# Patient Record
Sex: Male | Born: 1945 | Race: Black or African American | Hispanic: No | Marital: Married | Smoking: Former smoker
Health system: Southern US, Community
[De-identification: ages and names within clinical notes are randomized; demographics above are authoritative.]

## PROBLEM LIST (undated history)

## (undated) DIAGNOSIS — I214 Non-ST elevation (NSTEMI) myocardial infarction: Secondary | ICD-10-CM

## (undated) DIAGNOSIS — E119 Type 2 diabetes mellitus without complications: Secondary | ICD-10-CM

## (undated) DIAGNOSIS — E785 Hyperlipidemia, unspecified: Secondary | ICD-10-CM

## (undated) DIAGNOSIS — N183 Chronic kidney disease, stage 3 unspecified: Secondary | ICD-10-CM

## (undated) DIAGNOSIS — R112 Nausea with vomiting, unspecified: Secondary | ICD-10-CM

## (undated) DIAGNOSIS — E1122 Type 2 diabetes mellitus with diabetic chronic kidney disease: Secondary | ICD-10-CM

## (undated) DIAGNOSIS — I219 Acute myocardial infarction, unspecified: Secondary | ICD-10-CM

## (undated) DIAGNOSIS — D72829 Elevated white blood cell count, unspecified: Secondary | ICD-10-CM

## (undated) DIAGNOSIS — Z9111 Patient's noncompliance with dietary regimen: Secondary | ICD-10-CM

## (undated) DIAGNOSIS — I5022 Chronic systolic (congestive) heart failure: Secondary | ICD-10-CM

## (undated) DIAGNOSIS — I480 Paroxysmal atrial fibrillation: Secondary | ICD-10-CM

## (undated) DIAGNOSIS — I2510 Atherosclerotic heart disease of native coronary artery without angina pectoris: Secondary | ICD-10-CM

## (undated) DIAGNOSIS — I255 Ischemic cardiomyopathy: Secondary | ICD-10-CM

## (undated) DIAGNOSIS — Z91119 Patient's noncompliance with dietary regimen due to unspecified reason: Secondary | ICD-10-CM

## (undated) DIAGNOSIS — I10 Essential (primary) hypertension: Secondary | ICD-10-CM

## (undated) DIAGNOSIS — M109 Gout, unspecified: Secondary | ICD-10-CM

## (undated) DIAGNOSIS — Z9889 Other specified postprocedural states: Secondary | ICD-10-CM

## (undated) DIAGNOSIS — I200 Unstable angina: Secondary | ICD-10-CM

## (undated) HISTORY — DX: Hyperlipidemia, unspecified: E78.5

## (undated) HISTORY — DX: Atherosclerotic heart disease of native coronary artery without angina pectoris: I25.10

## (undated) HISTORY — DX: Type 2 diabetes mellitus with diabetic chronic kidney disease: E11.22

## (undated) HISTORY — PX: TONSILLECTOMY: SUR1361

## (undated) HISTORY — DX: Chronic kidney disease, stage 3 unspecified: N18.30

## (undated) HISTORY — DX: Essential (primary) hypertension: I10

## (undated) HISTORY — DX: Chronic kidney disease, stage 3 (moderate): N18.3

## (undated) HISTORY — DX: Morbid (severe) obesity due to excess calories: E66.01

## (undated) HISTORY — DX: Gout, unspecified: M10.9

## (undated) HISTORY — DX: Type 2 diabetes mellitus without complications: E11.9

## (undated) HISTORY — DX: Ischemic cardiomyopathy: I25.5

---

## 1993-12-20 HISTORY — PX: CORONARY ARTERY BYPASS GRAFT: SHX141

## 2008-01-12 ENCOUNTER — Inpatient Hospital Stay (HOSPITAL_COMMUNITY): Admission: EM | Admit: 2008-01-12 | Discharge: 2008-01-19 | Payer: Self-pay | Admitting: Emergency Medicine

## 2008-01-12 ENCOUNTER — Ambulatory Visit: Payer: Self-pay | Admitting: Internal Medicine

## 2008-01-12 HISTORY — PX: CORONARY ANGIOPLASTY WITH STENT PLACEMENT: SHX49

## 2008-01-17 ENCOUNTER — Encounter (INDEPENDENT_AMBULATORY_CARE_PROVIDER_SITE_OTHER): Payer: Self-pay | Admitting: Cardiology

## 2008-02-12 ENCOUNTER — Encounter (HOSPITAL_COMMUNITY): Admission: RE | Admit: 2008-02-12 | Discharge: 2008-05-12 | Payer: Self-pay | Admitting: Cardiology

## 2009-10-30 ENCOUNTER — Encounter: Admission: RE | Admit: 2009-10-30 | Discharge: 2009-10-30 | Payer: Self-pay | Admitting: Nephrology

## 2011-01-10 ENCOUNTER — Encounter: Payer: Self-pay | Admitting: Surgery

## 2011-05-04 NOTE — Discharge Summary (Signed)
NAMECYPHER, [name] NO.:  [id number]   MEDICAL RECORD NO.:  [id number]          PATIENT TYPE:  INP   LOCATION:  2039                         FACILITY:  [facility]   PHYSICIAN:  Madaline Savage, M.D.DATE OF BIRTH:  04/10/1946   DATE OF ADMISSION:  01/11/2008  DATE OF DISCHARGE:  01/19/2008                               DISCHARGE SUMMARY   DISCHARGE DIAGNOSES:  1. Status post acute non ST elevation myocardial infarction during      this admission treated with PTCA and stenting __________ SVG to a      diagonal branch.  2. Known history of coronary artery disease with diffuse coronary      artery bypass grafting surgery remotely in 1995.  3. Non-ischemic cardiomyopathy with ejection fraction of 20%.  4. Non-sustained ventricular tachycardia -20-beat run status post      electrophysiology consult during this admission, recommendations      are consult during this admission by Dr. Ladona Ridgel.  Recommendations      are to continue medical care and repeat echocardiogram in three      months.  5. Hypertension.  6. Diabetes mellitus type 2.  7. Morbid obesity.  8. Dyslipidemia.  9. Tobacco use.  10.Obstructive sleep apnea -  The patient needs sleep studies as soon as possible.   HISTORY OF PRESENT ILLNESS:  The patient is a 66 year old gentleman  patient of Dr. Elsie Lincoln, who had been having shortness of breath and chest  discomfort as well as congestion in the chest for probably a week or so.  On the day of his presentation to the emergency room, he visited his  primary care physician and received a steroid injection, but still his  condition did not improve significantly, and when he came back home  after visiting his primary care physician, he developed more episodes of  substernal chest pain and went to urgent care from where he was sent to  [facility] for evaluation.  His EKG on admission shows ischemic  changes in the inferior lateral leads.  __________  enzymes and they  revealed positive results.  We started him on IV nitroglycerin and  heparin, and the next morning, he underwent coronary angiography.  Catheter was performed by Dr. Elsie Lincoln, and it showed 100% stenosis of the  proximal circumflex, proximal LAD and proximal RCA, and EF 20%.  He also  had 100% stenosis of the occluded SVG, osteal SVG to the RCA, and osteal  SVG to the circumflex.  Dr. Elsie Lincoln performed  angioplasty and stenting  of the SVG to the diagonal branch.  The patient tolerated the procedure  and had no significant complications.   Because of history the patient with nonsustained VT, we ordered  electrophysiology consult, and Dr. Ladona Ridgel saw the patient.  The patient  underwent EP studies to see if it would be possible to induce  arrhythmia, but study was negative, and so with the negative EP study,  it was felt like patient would benefit from aggressive medical therapy  with ischemic cardiomyopathy, and then have a repeat echo in three  months.  If  his EF remains less or equal to 35%, then he might be a  candidate for implantation of prophylactic ICD.   The patient's blood pressure remained under control, and he did not have  any recurrence of NSVT.   Given his invasive pressure he also has problems with maintaining the  right sleep at night that may suggest obstructive sleep apnea, and he  will need to undergo sleep study, and if it is positive to be placed on  CPAP or BiPAP.   DISCHARGE LABORATORIES:  Hemoglobin 12.1, hematocrit 36.1, white blood  cell count 9.6, platelets 174.  Sodium 141, potassium 4.2, chloride 107,  CO2 of 30, BUN 18, creatinine 1.22, glucose 140.   Enzymes showed a CK of 3,590, CK-MB 181.9, and troponin 86.21 with a  relative index of 51.   Second set showed CK 2,411, CK-MB 52.1, and troponin 52.3.  TSH was  1.47, hemoglobin A1c 6.6.   DISCHARGE MEDICATIONS:  1. Actos 15 mg q d.  2. Aspirin 81 mg q d.  3. Lipitor 80 mg q h.s.  4.  Lisinopril 10 mg q d.  5. Protonix 40 mg q d.  6. Aldactone 25 mg q d.  7. Plavix 75 mg q d.  8. Coreg 12.5 mg b.i.d.  9. Lasix 40 mg q d.  10.Potassium chloride 20 mEq q d.  11.Aspirin 81 mg q d.   PLAN:  Dr. Elsie Lincoln will see the patient in the clinic on the 13th of  February at 9:30 a.m.  Dr. Olga Millers office will contact the  patient to schedule an appointment in three months.      Raymon Mutton, P.A.    ______________________________  Madaline Savage, M.D.    MK/MEDQ  D:  01/19/2008  T:  01/19/2008  Job:  [id number]   cc:   Doylene Canning. Ladona Ridgel, MD

## 2011-05-04 NOTE — Consult Note (Signed)
[name], [name] NO.:  [id number]   MEDICAL RECORD NO.:  [id number]          PATIENT TYPE:  INP   LOCATION:  2921                         FACILITY:  [facility]   PHYSICIAN:  Doylene Canning. Ladona Ridgel, MD    DATE OF BIRTH:  31-Dec-1945   DATE OF CONSULTATION:  01/16/2008  DATE OF DISCHARGE:                                 CONSULTATION   REASON FOR CONSULTATION:  Evaluation of nonsustained VT in a patient  with recent non-Q-wave myocardial infarction with LV dysfunction.   HISTORY OF PRESENT ILLNESS:  The patient is a 65 year old man who has a  long history of noncompliance.  He underwent initial bypass surgery in  1990 and was lost to follow-up for nearly 15 years.  He presented to the  hospital on the present occasion with substernal chest pain for which he  waited late in his clinical course to seek medical attention, and was  subsequently found to have initial troponin of 80 with catheterization  demonstrating an occluded vein graft to a diagonal branch for which he  underwent percutaneous stenting with a drug-eluting stent.  The  patient's post procedure course has been fairly unremarkable except for  runs of nonsustained ventricular tachycardia which have been  asymptomatic.  He denies any frank history of syncope and has otherwise  no complaints.  His heart failure is between class II and III.  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   ADDITIONAL PAST MEDICAL HISTORY:  1. sleep apnea for which he does not wear CPAP mask.  2. Hypertension which he has not treated with medications.  3. Diabetes which has been moderately controlled.  4. Morbid obesity for which the patient has gained over 100 pounds in      the last 10 years.  5. Hypertension.   FAMILY HISTORY:  Not notable for premature coronary disease.   SOCIAL HISTORY:  The patient is married.  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  He has a history tobacco  use and  continues to smoke cigarettes, though now less than half pack per day.  He has no alcohol use.   REVIEW OF SYSTEMS:  As noted in the HPI, otherwise, all systems negative  except as noted above.   PHYSICAL EXAMINATION:  General:  He is a pleasant, obese, middle-aged  man in no distress.  VITAL SIGNS:  Blood pressure 136/91, pulse 67 regular, respirations were  18, temperature is 98.1.  HEENT:  Normocephalic and atraumatic.  Pupils equal and round.  Oropharynx moist.  Sclerae anicteric.  NECK:  Revealed no obvious jugular distention.  No ability to evaluate  this as it is limited by his large neck.  LUNGS:  Revealed rales in the bases bilaterally, but no wheezes or  rhonchi.  Lung sounds were distant.  CARDIAC:  Exam was distant as well.  Regular rate and rhythm with normal  S1-S2.  The PMI was enlarged and laterally displaced.  The heart sounds  were muffled and distant.  ABDOMINAL:  Morbidly obese, nontender, nondistended.  No organomegaly.  Bowel sounds are present.  No rebound or guarding.  EXTREMITIES:  Demonstrate no cyanosis or clubbing.  There was 1+ peripheral edema.  NEUROLOGIC:  Alert and oriented x3.  Cranial nerves intact.  Strength  5/5 and symmetric.  SKIN:  Exam was notable for areas of very thickened skin consistent with  his obesity.   IMPRESSION:  1. Ischemic cardiomyopathy.  2. Non-Q-wave myocardial infarction status post stenting.  3. Longstanding obesity.  4. 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  5. Sleep apnea poorly controlled.   DISCUSSION:  I discussed treatment options with the patient, his wife  and Dr. Elsie Lincoln.  With his recent myocardial infarction and percutaneous  stenting, he is not a candidate for prophylactic ICD implantation.  However, with his nonsustained VT and LV dysfunction, he is a candidate  for invasive EP study, i.e. the must data.  If he has inducible VT then  ICD implantation will be forthcoming.   Otherwise, a repeat of his echo  in 3 months would be warranted to see what his LV function does with  prophylactic defibrillator placed at that time.      Doylene Canning. Ladona Ridgel, MD  Electronically Signed     GWT/MEDQ  D:  01/16/2008  T:  01/16/2008  Job:  [id number]

## 2011-05-04 NOTE — Cardiovascular Report (Signed)
NAMEJALAL, RAUCH NO.:  [id number]   MEDICAL RECORD NO.:  [id number]          PATIENT TYPE:  INP   LOCATION:  2807                         FACILITY:  [facility]   PHYSICIAN:  Madaline Savage, M.D.DATE OF BIRTH:  10/23/1946   DATE OF PROCEDURE:  01/12/2008  DATE OF DISCHARGE:                            CARDIAC CATHETERIZATION   PROCEDURES PERFORMED:  1. Selective coronary angiography by Judkins technique.  2. Retrograde left heart catheterization.  3. Left ventricular angiography.  4. Selective visualization of a saphenous vein graft to a diagonal      branch of the LAD.  5. Selective visualization of two others saphenous vein grafts which      ended up being occluded, one to circumflex, one to right coronary      artery.  6. Selective visualization of the left internal mammary artery graft      to the distal LAD.  7. Percutaneous balloon angioplasty of the distal end of a saphenous      vein graft to diagonal which was 99% stenosed at the junction      between the saphenous vein graft and the diagonal itself.  8. Intracoronary stenting of the balloon angioplasty site which was      very tortuous.   COMPLICATIONS:  None.   ENTRY SITE:  Right femoral artery utilizing a long needle and a Doppler  device because of morbid obesity.   PATIENT PROFILE:  Mr. Sliney is a 65 year old African-American  gentleman who has known coronary disease and underwent coronary bypass  grafting in the early 1990s. None of his medical records were available  at the time of his hospital entry or during the cardiac catheterization.  He presented to the emergency room last evening with acute coronary  syndrome and the first troponin was said to be 4.35 at a time when the  patient was fairly stable so he was electively brought to the cath lab  this morning. Review of his old cath lab folder which was just found a  few moments ago shows that Dr. Delfin Edis treated him for  unstable  angina back in April 1990. At that time he was a chronic smoker and Dr.  Particia Lather performed bypass April 13, 1989. The patient received  left internal mammary artery grafting to the LAD, a saphenous vein graft  from the aorta to the RCA, saphenous vein graft from the aorta to an  obtuse marginal branch of the circumflex and a saphenous vein graft from  the aorta to a diagonal branch.   ANGIOGRAPHIC RESULTS:  The left main coronary artery was patent.  The  LAD was 100% occluded almost at the left main LAD junction.  The  circumflex was also occluded very proximally to the left main.  The  right coronary artery was 100% occluded very proximally with a number of  very chronic collateralizing branches that supply the distal RCA and  failed what looked like a posterior descending branch and a distal RCA  as well.   The saphenous vein graft to the RCA was occluded.   Saphenous vein graft to  the circumflex was occluded.   Saphenous vein graft to the aorta obtuse marginal branch was occluded.   Internal mammary artery graft to the LAD was widely patent throughout  and was large in circumference. There was distal LAD disease that was  mild and fairly diffuse and not really amenable to percutaneous  intervention. As previously stated the saphenous vein graft to  circumflex was stenosed 95-99% with what turned out to be very fibrous  stenotic plaque.   Left ventricular angiogram showed a dilated left ventricle with  diffusely hypokinetic LV.   Percutaneous intervention to the saphenous vein graft to the diagonal  was performed first using balloon dilatation and then using  intracoronary artery stenting.  It was technically difficult because of  the guide catheter backup, lack of support. The vessel was predilated  with balloon angioplasty and then stented with a Taxus Adam stent and  postdilated up to 3 mm diameter.  There was improvement of blood flow in  the distal  diagonal which was a very large vessel and the patient  tolerated the procedure poorly because of a lot of breathing issues,  falling asleep and the patient would appear to have obstructive sleep  apnea.   FINAL IMPRESSION:  1. Acute coronary syndrome.  2. Non-ST segment elevation myocardial infarction based on troponin      values.  3. Multivessel coronary disease with occluded vein grafts to multiple      sites including RCA, obtuse marginal branch and circumflex.  4. Successful balloon angioplasty with percutaneous stenting of the      distal graft supplying this large diagonal branch.   PLAN:  The patient will be recovered in the holding area and transferred  to unit 6500.           ______________________________  Madaline Savage, M.D.     WHG/MEDQ  D:  01/12/2008  T:  01/12/2008  Job:  [id number]

## 2011-05-04 NOTE — Op Note (Signed)
NAMEDEVARIS, QUIRK NO.:  [id number]   MEDICAL RECORD NO.:  [id number]          PATIENT TYPE:  INP   LOCATION:  2921                         FACILITY:  [facility]   PHYSICIAN:  Duke Salvia, MD, FACCDATE OF BIRTH:  [birth year]   DATE OF PROCEDURE:  01/16/2008  DATE OF DISCHARGE:                               OPERATIVE REPORT   PREOPERATIVE DIAGNOSIS:  Nonsustained ventricular tachycardia and  ischemic cardiomyopathy.   POSTOPERATIVE DIAGNOSIS:  Nonsustained ventricular tachycardia and  ischemic cardiomyopathy.   PROCEDURE:  Invasive electrophysiological study.   Following obtaining informed consent, the patient was brought to the  electrophysiology laboratory and placed on the fluoroscopic table in  supine position.  After routine prep and drape, cardiac catheterization  was performed moderate difficulty because of the patient's large size.  I actually stuck the artery on one occasion, stuck the vein on one  occasion, were unable to  pass wires and were able to get to separate  venipunctures.  6-French sheaths were placed and at the end of procedure  the catheters removed.  Hemostasis was obtained the patient was  transferred holding area in stable condition.   CATHETERS:  A 5-French quadripolar catheter was inserted via left  femoral vein to the AV junction measure His electrogram.  A 5-French quadripolar catheter was inserted via left femoral vein to  the right ventricular apex subsequently the right ventricular outflow  tract and then to the right atrium.   Surface leads 1, aVF and V1 were monitored continuously throughout the  procedure.  Following insertion of the catheters stimulation protocol  included incremental atrial pacing.  Incremental ventricular pacing.   Single and double ventricular extra stimuli from the right ventricular  apex at paced cycle length of 400:600 milliseconds.  Double and triple ventricular extra stimuli from right  ventricular apex.  The right ventricular outflow tract at paced cycle length of 600 and 400  milliseconds.   RESULTS:  Surface electrocardiogram and basic intervals.  Rhythm sinus; RR interval:  859 milliseconds; R interval 1003  milliseconds; PR interval 159 milliseconds; QRS duration 108  milliseconds; QT interval 396 milliseconds; P-wave duration was 98  milliseconds; AH interval 79 milliseconds; HV interval 49 milliseconds.  AV nodal function.  AV Wenckebach cycle length was 300 milliseconds.  VA conduction was 1 to 1 at 400 milliseconds.  AV nodal effective refractory period was less than 320 milliseconds  which was a functional refractory period of the atrium.   ACCESSORY PATHWAY FUNCTION:  No evidence of accessory pathway was  identified.   Ventricular response to programmed stimulation.  Effective refractory  period effective refractory ventricular apex at a pace cycle length of  600 milliseconds was 270 milliseconds and at 400 milliseconds was 260  milliseconds.  Effective refractory for the right ventricular outflow tract at paced  cycle length of 600 milliseconds was 270 milliseconds and 400  milliseconds was 240 milliseconds.  The closest coupling interval at  right ventricular apex at 600 milliseconds 290:210:210 and at 400  milliseconds was 260:200:200.  From right ventricular outflow tract at  600 was 270:210 and at  400 milliseconds was 240:200:200.   ARRHYTHMIAS INDUCED:  No sustained ventricular tachycardia was induced.  One burst of three beats of nonsustained VT was inducible.   IMPRESSION:  1. Normal sinus function apart from modest bradycardia.  2. Normal atrial function.  3. Dual AV nodal physiology with isolated single echo beats.  4. Normal His-Purkinje system function.  5. No accessory pathway.  6. No inducible sustained ventricular arrhythmias.   In summary, the results electrophysiological testing failed to identify  substrate for malignant  ventricular arrhythmias, notwithstanding his  nonsustained ventricular tachycardia.   Based on the Dynamite trial observation for 40 days is recommended with  reassessment of left ventricular function at which time primary  prevention ICD implantation could be pursued if his ejection fraction  remained depressed.      Duke Salvia, MD, [facility]  Electronically Signed     SCK/MEDQ  D:  01/16/2008  T:  01/17/2008  Job:  [id number]   cc:   Madaline Savage, M.D.

## 2011-09-10 LAB — TSH: TSH: 1.472

## 2011-09-10 LAB — HEMOGLOBIN A1C: Mean Plasma Glucose: 158

## 2011-09-10 LAB — BASIC METABOLIC PANEL
BUN: 17
BUN: 17
BUN: 18
BUN: 18
BUN: 18
BUN: 19
CO2: 21
CO2: 30
CO2: 31
CO2: 32
Calcium: 8.4
Calcium: 8.5
Calcium: 8.6
Calcium: 8.7
Calcium: 8.7
Calcium: 8.8
Calcium: 9
Chloride: 102
Chloride: 104
Chloride: 105
Chloride: 107
Chloride: 99
Creatinine, Ser: 1.16
Creatinine, Ser: 1.32
Creatinine, Ser: 1.38
Creatinine, Ser: 1.39
GFR calc Af Amer: >60
GFR calc Af Amer: >60
GFR calc Af Amer: >60
GFR calc Af Amer: >60
GFR calc non Af Amer: 55 — ABNORMAL LOW
GFR calc non Af Amer: >60
GFR calc non Af Amer: >60
GFR calc non Af Amer: >60
Glucose, Bld: 130 — ABNORMAL HIGH
Glucose, Bld: 131 — ABNORMAL HIGH
Glucose, Bld: 140 — ABNORMAL HIGH
Glucose, Bld: 162 — ABNORMAL HIGH
Glucose, Bld: 384 — ABNORMAL HIGH
Potassium: 4.1
Potassium: 4.3
Sodium: 137
Sodium: 139
Sodium: 140
Sodium: 140
Sodium: 141

## 2011-09-10 LAB — CBC
HCT: 37.8 — ABNORMAL LOW
HCT: 40
HCT: 45.8
Hemoglobin: 12 — ABNORMAL LOW
Hemoglobin: 12.1 — ABNORMAL LOW
Hemoglobin: 12.4 — ABNORMAL LOW
Hemoglobin: 13
Hemoglobin: 14.8
MCHC: 32.8
MCHC: 33.3
MCHC: 33.6
MCV: 89.6
MCV: 89.6
MCV: 89.9
MCV: 89.9
Platelets: 175
Platelets: 176
Platelets: 179
Platelets: 186
Platelets: 193
Platelets: 213
Platelets: 237
RBC: 4.03 — ABNORMAL LOW
RBC: 4.07 — ABNORMAL LOW
RBC: 4.07 — ABNORMAL LOW
RBC: 4.7
RDW: 15.1
RDW: 15.1
RDW: 15.3
RDW: 15.4
RDW: 15.6 — ABNORMAL HIGH
WBC: 10.1
WBC: 10.3
WBC: 11 — ABNORMAL HIGH
WBC: 13.2 — ABNORMAL HIGH
WBC: 17.3 — ABNORMAL HIGH
WBC: 9.6

## 2011-09-10 LAB — POCT CARDIAC MARKERS
CKMB, poc: 45
Troponin i, poc: 4.88

## 2011-09-10 LAB — I-STAT 8, (EC8 V) (CONVERTED LAB)
Acid-base deficit: 4 — ABNORMAL HIGH
Hemoglobin: 17.3 — ABNORMAL HIGH
Operator id: 196461
Potassium: 4.8
Sodium: 134 — ABNORMAL LOW
TCO2: 20

## 2011-09-10 LAB — DIFFERENTIAL
Eosinophils Relative: 0
Lymphocytes Relative: 5 — ABNORMAL LOW
Lymphs Abs: 0.6 — ABNORMAL LOW
Lymphs Abs: 1.1
Monocytes Absolute: 0.1
Monocytes Relative: 3
Neutro Abs: 15.8 — ABNORMAL HIGH
Neutrophils Relative %: 91 — ABNORMAL HIGH

## 2011-09-10 LAB — POCT I-STAT 3, ART BLOOD GAS (G3+)
Acid-base deficit: 3 — ABNORMAL HIGH
Operator id: 270211
pO2, Arterial: 69 — ABNORMAL LOW

## 2011-09-10 LAB — CK TOTAL AND CKMB (NOT AT ARMC)
CK, MB: 271.2 — ABNORMAL HIGH
CK, MB: 52.1 — ABNORMAL HIGH
Relative Index: 2.2
Relative Index: 7.7 — ABNORMAL HIGH
Relative Index: 8.1 — ABNORMAL HIGH
Total CK: 3360 — ABNORMAL HIGH

## 2011-09-10 LAB — CARDIAC PANEL(CRET KIN+CKTOT+MB+TROPI): Troponin I: 86.21

## 2011-09-10 LAB — TROPONIN I: Troponin I: 72.73

## 2011-09-10 LAB — LIPID PANEL: Triglycerides: 58

## 2011-09-10 LAB — PROTIME-INR
INR: 1
Prothrombin Time: 13.4

## 2011-09-10 LAB — HEPARIN LEVEL (UNFRACTIONATED): Heparin Unfractionated: 0.59

## 2014-01-25 ENCOUNTER — Encounter: Payer: Self-pay | Admitting: *Deleted

## 2014-01-25 ENCOUNTER — Telehealth: Payer: Self-pay | Admitting: *Deleted

## 2014-01-25 ENCOUNTER — Encounter: Payer: Self-pay | Admitting: Cardiovascular Disease

## 2014-01-25 ENCOUNTER — Ambulatory Visit (INDEPENDENT_AMBULATORY_CARE_PROVIDER_SITE_OTHER): Payer: Medicare Other | Admitting: Cardiovascular Disease

## 2014-01-25 VITALS — BP 100/70 | HR 83 | Ht 79.0 in | Wt 384.0 lb

## 2014-01-25 DIAGNOSIS — M109 Gout, unspecified: Secondary | ICD-10-CM

## 2014-01-25 DIAGNOSIS — E1129 Type 2 diabetes mellitus with other diabetic kidney complication: Secondary | ICD-10-CM

## 2014-01-25 DIAGNOSIS — I5042 Chronic combined systolic (congestive) and diastolic (congestive) heart failure: Secondary | ICD-10-CM | POA: Insufficient documentation

## 2014-01-25 DIAGNOSIS — I509 Heart failure, unspecified: Secondary | ICD-10-CM

## 2014-01-25 DIAGNOSIS — N183 Chronic kidney disease, stage 3 unspecified: Secondary | ICD-10-CM

## 2014-01-25 DIAGNOSIS — I5022 Chronic systolic (congestive) heart failure: Secondary | ICD-10-CM

## 2014-01-25 DIAGNOSIS — E1122 Type 2 diabetes mellitus with diabetic chronic kidney disease: Secondary | ICD-10-CM | POA: Insufficient documentation

## 2014-01-25 DIAGNOSIS — I2510 Atherosclerotic heart disease of native coronary artery without angina pectoris: Secondary | ICD-10-CM | POA: Insufficient documentation

## 2014-01-25 DIAGNOSIS — I2589 Other forms of chronic ischemic heart disease: Secondary | ICD-10-CM

## 2014-01-25 DIAGNOSIS — I255 Ischemic cardiomyopathy: Secondary | ICD-10-CM | POA: Insufficient documentation

## 2014-01-25 DIAGNOSIS — E785 Hyperlipidemia, unspecified: Secondary | ICD-10-CM

## 2014-01-25 MED ORDER — CLOPIDOGREL BISULFATE 75 MG PO TABS: 75.0000 mg | ORAL_TABLET | Freq: Every day | ORAL | Status: DC

## 2014-01-25 MED ORDER — LISINOPRIL 10 MG PO TABS: 10.0000 mg | ORAL_TABLET | Freq: Every day | ORAL | Status: DC

## 2014-01-25 MED ORDER — ATORVASTATIN CALCIUM 80 MG PO TABS: 80.0000 mg | ORAL_TABLET | Freq: Every day | ORAL | Status: DC

## 2014-01-25 MED ORDER — SPIRONOLACTONE 25 MG PO TABS: 25.0000 mg | ORAL_TABLET | Freq: Every day | ORAL | Status: DC

## 2014-01-25 MED ORDER — FUROSEMIDE 40 MG PO TABS: 40.0000 mg | ORAL_TABLET | Freq: Every day | ORAL | Status: DC

## 2014-01-25 MED ORDER — NEBIVOLOL HCL 10 MG PO TABS: 10.0000 mg | ORAL_TABLET | Freq: Every day | ORAL | Status: DC

## 2014-01-25 NOTE — Telephone Encounter (Signed)
All cardiac meds refilled #90 w/3 refills.

## 2014-01-25 NOTE — Telephone Encounter (Signed)
Pt stated that he needs his medication filled. He forgot to let Britta MccreedyBarbara know that he needed refills on all his medication.  [facility]

## 2014-01-25 NOTE — Progress Notes (Signed)
Patient ID: [name], male   DOB: Jun 12, 1946, 68 y.o.   MRN: [id number]      Reason for office visit CHF, CAD  This is Mr. Wigen's first visit since February of 2014 and has not had any major health setback since that time. He has advanced coronary heart disease, is graft dependent and has severe ischemic cardiomyopathy. All his native coronaries are occluded as are the previously placed grafts to the oblique marginal and right coronary artery. He has a patent mammary artery bypass to the LAD and received a stent to the saphenous vein graft to the diagonal artery in 2009. His left ventricular ejection fraction is estimated to be around 15%.  Despite this he is quite active and has NYHA functional class II dyspnea. When walking he cannot keep up with his wife but otherwise has few limitations. He denies any angina pectoris, dyspnea at rest, orthopnea, PND or lower extremity edema. He remains morbidly obese with a BMI well in excess of 40. He rarely smokes a cigarette sometimes every 2 weeks or so. His diabetes control is substantially improved and he tells me that his last hemoglobin A1c was around 6%. He states that his lipid profile was also good, but I do not have those results yet. His creatinine clearance is estimated at around 55 mL per minute, not much changed. He has not had recent gout attacks. His systolic blood pressure is usually right around 100 mm Hg.  He has declined primary prevention defibrillator implantation.   Allergies  Allergen Reactions  . Coreg [Carvedilol]     Current Outpatient Prescriptions  Medication Sig Dispense Refill  . allopurinol (ZYLOPRIM) 100 MG tablet Take 100 mg by mouth daily.      Marland Kitchen aspirin 81 MG tablet Take 81 mg by mouth daily.      Marland Kitchen atorvastatin (LIPITOR) 80 MG tablet Take 80 mg by mouth daily.      Marland Kitchen BYSTOLIC 10 MG tablet Take 10 mg by mouth daily.      . cholecalciferol (VITAMIN D) 1000 UNITS tablet Take 1,000 Units by mouth every other  day.      . clopidogrel (PLAVIX) 75 MG tablet Take 75 mg by mouth daily.      . furosemide (LASIX) 40 MG tablet Take 40 mg by mouth daily.      . insulin aspart (NOVOLOG) 100 UNIT/ML injection Inject 32 Units into the skin daily.      . INVOKANA 100 MG TABS Take 100 mg by mouth daily.      Marland Kitchen LANTUS 100 UNIT/ML injection Inject 32 Units into the skin daily.      Marland Kitchen lisinopril (PRINIVIL,ZESTRIL) 10 MG tablet Take 10 mg by mouth daily.      . Omega-3 Fatty Acids (FISH OIL) 1200 MG CAPS Take by mouth daily.      Marland Kitchen spironolactone (ALDACTONE) 25 MG tablet Take 25 mg by mouth daily.      Marland Kitchen VICTOZA 18 MG/3ML SOPN Inject 1.25 mg into the skin daily.       No current facility-administered medications for this visit.    Past Medical History  Diagnosis Date  . Ischemic cardiomyopathy   . CAD (coronary artery disease)   . CHF (congestive heart failure)   . Systemic hypertension   . Chronic kidney disease   . DM (diabetes mellitus)   . Morbid obesity   . CAD s/p CABG 1995, graft dependent (LIMA to LAD, SVG to diagonal stented, other grafts occluded)  01/25/2014    Angiography 2009 with total occlusion of all 3 proximal native coronary arteries, occlusion of the SVG to RCA and SVG to circumflex, patent LIMA to LAD (collaterals to the distal RCA and OM) and stenotic SVG to first diagonal treated with a drug-eluting Taxus atom stent  . Cardiomyopathy, ischemic 01/25/2014    LVEF 15%  . Chronic systolic CHF (congestive heart failure), NYHA class 2 01/25/2014  . DM type 2 causing CKD stage 3 01/25/2014  . Gout 01/25/2014  . Dyslipidemia - low HDL and high triglycerides 01/25/2014    Past Surgical History  Procedure Laterality Date  . Coronary artery bypass graft  1995  . Coronary angioplasty with stent placement  01/12/2008    multivessel CAD occluded vein grafts to multiple sites including RCA,obtuse marginal branch & CX.  Successful PTCA and stenting distal graft.    Family History  Problem Relation Age of  Onset  . Heart failure Mother     History   Social History  . Marital Status: Married    Spouse Name: N/A    Number of Children: N/A  . Years of Education: N/A   Occupational History  . Not on file.   Social History Main Topics  . Smoking status: Current Every Day Smoker  . Smokeless tobacco: Not on file  . Alcohol Use: Yes     Comment: occas.  . Drug Use: No  . Sexual Activity: Not on file   Other Topics Concern  . Not on file   Social History Narrative  . No narrative on file    Review of systems: The patient specifically denies any chest pain at rest or with exertion, dyspnea at rest or with usual exertion, orthopnea, paroxysmal nocturnal dyspnea, syncope, palpitations, focal neurological deficits, intermittent claudication, lower extremity edema, unexplained weight gain, cough, hemoptysis or wheezing.  The patient also denies abdominal pain, nausea, vomiting, dysphagia, diarrhea, constipation, polyuria, polydipsia, dysuria, hematuria, frequency, urgency, abnormal bleeding or bruising, fever, chills, unexpected weight changes, mood swings, change in skin or hair texture, change in voice quality, auditory or visual problems, allergic reactions or rashes, new musculoskeletal complaints other than usual "aches and pains".   PHYSICAL EXAM BP 100/70  Pulse 83  Ht '6\' 7"'  (2.007 m)  Wt 174.181 kg (384 lb)  BMI 43.24 kg/m2 Morbid obesity limits his examination  General: Alert, oriented x3, no distress Head: no evidence of trauma, PERRL, EOMI, no exophtalmos or lid lag, no myxedema, no xanthelasma; normal ears, nose and oropharynx Neck: normal jugular venous pulsations and no hepatojugular reflux; brisk carotid pulses without delay and no carotid bruits Chest: clear to auscultation, no signs of consolidation by percussion or palpation, normal fremitus, symmetrical and full respiratory excursions. Large keloid sternotomy scar Cardiovascular: Not locate the apical impulse,  regular rhythm, normal first and second heart sounds, no murmurs, rubs or gallops Abdomen: no tenderness or distention, no masses by palpation, no abnormal pulsatility or arterial bruits, normal bowel sounds, no hepatosplenomegaly Extremities: no clubbing, cyanosis or edema; 2+ radial, ulnar and brachial pulses bilaterally; 2+ right femoral, posterior tibial and dorsalis pedis pulses; 2+ left femoral, posterior tibial and dorsalis pedis pulses; no subclavian or femoral bruits Neurological: grossly nonfocal   EKG: Sinus rhythm with a single PVCs, minor intraventricular conduction delay most closely resembling an incomplete left bundle branch block (QRS 100 ms), diffuse ST segment depression and T-wave inversion most prominent in the lateral leads, unchanged from previous tracings.  Lipid Panel  Component Value Date/Time   CHOL  Value: 143        ATP III CLASSIFICATION:  <200     mg/dL   Desirable  200-239  mg/dL   Borderline High  >=240    mg/dL   High 01/12/2008 0500   TRIG 58 01/12/2008 0500   HDL 40 01/12/2008 0500   CHOLHDL 3.6 01/12/2008 0500   VLDL 12 01/12/2008 0500   LDLCALC  Value: 91        Total Cholesterol/HDL:CHD Risk Coronary Heart Disease Risk Table                     Men   Women  1/2 Average Risk   3.4   3.3 01/12/2008 0500    BMET    Component Value Date/Time   NA 141 01/19/2008 0355   K 4.2 01/19/2008 0355   CL 107 01/19/2008 0355   CO2 30 01/19/2008 0355   GLUCOSE 140* 01/19/2008 0355   BUN 18 01/19/2008 0355   CREATININE 1.22 01/19/2008 0355   CALCIUM 8.7 01/19/2008 0355   GFRNONAA >60 01/19/2008 0355   GFRAA  Value: >60        The eGFR has been calculated using the MDRD equation. This calculation has not been validated in all clinical 01/19/2008 0355     ASSESSMENT AND PLAN  Despite severe coronary disease (native and graft) and severe cardiomyopathy, S2 normal with has good functional status and there appears to be no change in his clinical situation. He does not have  overt hypervolemia and his functional ability has not changed.  He is on maximum tolerated doses of ACE inhibitors and a combined alpha beta blocker. The blood pressure precludes increases in these medicines. He is also receiving an aldosterone antagonist in a relatively low dose of loop diuretic.  He is on a high dose of active statin and takes aspirin and clopidogrel for prevention of future ischemic events. Diabetes control appears to be markedly improved. Have encouraged him to completely quit smoking but she really doesn't consider the amount that he smokes to be important.  I mentioned to defibrillator therapy again today but he is not interested at all. I reminded him about the importance of sodium restriction and strongly encouraged him to record his weight daily so that he may become aware fluid retention before to cause serious symptoms.   Orders Placed This Encounter  Procedures  . EKG 12-Lead   Meds ordered this encounter  Medications  . insulin aspart (NOVOLOG) 100 UNIT/ML injection    Sig: Inject 32 Units into the skin daily.  . cholecalciferol (VITAMIN D) 1000 UNITS tablet    Sig: Take 1,000 Units by mouth every other day.    Holli Humbles, MD, [place] [phone] office [phone] pager

## 2014-01-25 NOTE — Patient Instructions (Signed)
Your physician recommends that you schedule a follow-up appointment in: ONE YEAR 

## 2014-01-25 NOTE — Telephone Encounter (Signed)
Forwarded to Barbara, CMA.  

## 2014-01-25 NOTE — Telephone Encounter (Signed)
All cardiac meds refilled 

## 2015-01-29 ENCOUNTER — Encounter (HOSPITAL_COMMUNITY): Payer: Self-pay | Admitting: Cardiovascular Disease

## 2015-01-29 ENCOUNTER — Encounter (HOSPITAL_COMMUNITY)
Admission: AD | Disposition: A | Discharge status: Home / Self Care | Payer: Self-pay | Source: Ambulatory Visit | Attending: Cardiovascular Disease

## 2015-01-29 ENCOUNTER — Inpatient Hospital Stay (HOSPITAL_COMMUNITY)
Admission: AD | Admit: 2015-01-29 | Discharge: 2015-02-01 | DRG: 246 | Disposition: A | Discharge status: Home / Self Care | Payer: Medicare Other | Source: Ambulatory Visit | Attending: Cardiovascular Disease | Admitting: Cardiovascular Disease

## 2015-01-29 DIAGNOSIS — T82858A Stenosis of vascular prosthetic devices, implants and grafts, initial encounter: Principal | ICD-10-CM | POA: Diagnosis present

## 2015-01-29 DIAGNOSIS — I10 Essential (primary) hypertension: Secondary | ICD-10-CM | POA: Diagnosis present

## 2015-01-29 DIAGNOSIS — I493 Ventricular premature depolarization: Secondary | ICD-10-CM | POA: Diagnosis not present

## 2015-01-29 DIAGNOSIS — M109 Gout, unspecified: Secondary | ICD-10-CM | POA: Diagnosis present

## 2015-01-29 DIAGNOSIS — I2510 Atherosclerotic heart disease of native coronary artery without angina pectoris: Secondary | ICD-10-CM | POA: Diagnosis present

## 2015-01-29 DIAGNOSIS — Z951 Presence of aortocoronary bypass graft: Secondary | ICD-10-CM

## 2015-01-29 DIAGNOSIS — I2589 Other forms of chronic ischemic heart disease: Secondary | ICD-10-CM

## 2015-01-29 DIAGNOSIS — E781 Pure hyperglyceridemia: Secondary | ICD-10-CM | POA: Diagnosis present

## 2015-01-29 DIAGNOSIS — E1122 Type 2 diabetes mellitus with diabetic chronic kidney disease: Secondary | ICD-10-CM | POA: Diagnosis present

## 2015-01-29 DIAGNOSIS — I2582 Chronic total occlusion of coronary artery: Secondary | ICD-10-CM | POA: Diagnosis present

## 2015-01-29 DIAGNOSIS — Z7982 Long term (current) use of aspirin: Secondary | ICD-10-CM

## 2015-01-29 DIAGNOSIS — Z79899 Other long term (current) drug therapy: Secondary | ICD-10-CM

## 2015-01-29 DIAGNOSIS — I2102 ST elevation (STEMI) myocardial infarction involving left anterior descending coronary artery: Secondary | ICD-10-CM

## 2015-01-29 DIAGNOSIS — Z7902 Long term (current) use of antithrombotics/antiplatelets: Secondary | ICD-10-CM

## 2015-01-29 DIAGNOSIS — I255 Ischemic cardiomyopathy: Secondary | ICD-10-CM | POA: Diagnosis present

## 2015-01-29 DIAGNOSIS — N179 Acute kidney failure, unspecified: Secondary | ICD-10-CM | POA: Diagnosis not present

## 2015-01-29 DIAGNOSIS — I2119 ST elevation (STEMI) myocardial infarction involving other coronary artery of inferior wall: Secondary | ICD-10-CM | POA: Diagnosis present

## 2015-01-29 DIAGNOSIS — Z6841 Body Mass Index (BMI) 40.0 and over, adult: Secondary | ICD-10-CM | POA: Diagnosis not present

## 2015-01-29 DIAGNOSIS — Z794 Long term (current) use of insulin: Secondary | ICD-10-CM | POA: Diagnosis not present

## 2015-01-29 DIAGNOSIS — Z955 Presence of coronary angioplasty implant and graft: Secondary | ICD-10-CM | POA: Diagnosis not present

## 2015-01-29 DIAGNOSIS — I2109 ST elevation (STEMI) myocardial infarction involving other coronary artery of anterior wall: Secondary | ICD-10-CM | POA: Insufficient documentation

## 2015-01-29 DIAGNOSIS — F17200 Nicotine dependence, unspecified, uncomplicated: Secondary | ICD-10-CM | POA: Diagnosis present

## 2015-01-29 DIAGNOSIS — I25810 Atherosclerosis of coronary artery bypass graft(s) without angina pectoris: Secondary | ICD-10-CM

## 2015-01-29 DIAGNOSIS — N183 Chronic kidney disease, stage 3 (moderate): Secondary | ICD-10-CM | POA: Diagnosis present

## 2015-01-29 DIAGNOSIS — Z8249 Family history of ischemic heart disease and other diseases of the circulatory system: Secondary | ICD-10-CM | POA: Diagnosis not present

## 2015-01-29 DIAGNOSIS — Y838 Other surgical procedures as the cause of abnormal reaction of the patient, or of later complication, without mention of misadventure at the time of the procedure: Secondary | ICD-10-CM | POA: Diagnosis present

## 2015-01-29 DIAGNOSIS — I5022 Chronic systolic (congestive) heart failure: Secondary | ICD-10-CM | POA: Diagnosis present

## 2015-01-29 DIAGNOSIS — I213 ST elevation (STEMI) myocardial infarction of unspecified site: Secondary | ICD-10-CM | POA: Diagnosis present

## 2015-01-29 DIAGNOSIS — R0789 Other chest pain: Secondary | ICD-10-CM | POA: Diagnosis present

## 2015-01-29 HISTORY — PX: LEFT HEART CATHETERIZATION WITH CORONARY ANGIOGRAM: SHX5451

## 2015-01-29 LAB — COMPREHENSIVE METABOLIC PANEL
ALBUMIN: 3.7 g/dL (ref 3.5–5.2)
ALK PHOS: 46 U/L (ref 39–117)
ALT: 21 U/L (ref 0–53)
AST: 33 U/L (ref 0–37)
Anion gap: 11 (ref 5–15)
BUN: 18 mg/dL (ref 6–23)
CO2: 19 mmol/L (ref 19–32)
Calcium: 9.4 mg/dL (ref 8.4–10.5)
Chloride: 105 mmol/L (ref 96–112)
Creatinine, Ser: 1.8 mg/dL — ABNORMAL HIGH (ref 0.50–1.35)
GFR calc Af Amer: 43 mL/min — ABNORMAL LOW (ref >90)
GFR calc non Af Amer: 37 mL/min — ABNORMAL LOW (ref >90)
Glucose, Bld: 227 mg/dL — ABNORMAL HIGH (ref 70–99)
POTASSIUM: 4 mmol/L (ref 3.5–5.1)
SODIUM: 135 mmol/L (ref 135–145)
TOTAL PROTEIN: 7.1 g/dL (ref 6.0–8.3)
Total Bilirubin: 0.8 mg/dL (ref 0.3–1.2)

## 2015-01-29 LAB — CBC WITH DIFFERENTIAL/PLATELET
BASOS ABS: 0 10*3/uL (ref 0.0–0.1)
BASOS PCT: 0 % (ref 0–1)
EOS ABS: 0 10*3/uL (ref 0.0–0.7)
EOS PCT: 0 % (ref 0–5)
HCT: 51 % (ref 39.0–52.0)
Hemoglobin: 17.1 g/dL — ABNORMAL HIGH (ref 13.0–17.0)
Lymphocytes Relative: 15 % (ref 12–46)
Lymphs Abs: 1.9 10*3/uL (ref 0.7–4.0)
MCH: 29.1 pg (ref 26.0–34.0)
MCHC: 33.5 g/dL (ref 30.0–36.0)
MCV: 86.9 fL (ref 78.0–100.0)
Monocytes Absolute: 0.4 10*3/uL (ref 0.1–1.0)
Monocytes Relative: 3 % (ref 3–12)
Neutro Abs: 10.4 10*3/uL — ABNORMAL HIGH (ref 1.7–7.7)
Neutrophils Relative %: 82 % — ABNORMAL HIGH (ref 43–77)
PLATELETS: 164 10*3/uL (ref 150–400)
RBC: 5.87 MIL/uL — ABNORMAL HIGH (ref 4.22–5.81)
RDW: 15 % (ref 11.5–15.5)
WBC: 12.7 10*3/uL — ABNORMAL HIGH (ref 4.0–10.5)

## 2015-01-29 LAB — TROPONIN I: Troponin I: 0.07 ng/mL — ABNORMAL HIGH (ref <0.031)

## 2015-01-29 LAB — PROTIME-INR
INR: 1.01 (ref 0.00–1.49)
Prothrombin Time: 13.4 seconds (ref 11.6–15.2)

## 2015-01-29 LAB — CK TOTAL AND CKMB (NOT AT ARMC)
CK TOTAL: 766 U/L — AB (ref 7–232)
CK, MB: 8.3 ng/mL (ref 0.3–4.0)
Relative Index: 1.1 (ref 0.0–2.5)

## 2015-01-29 LAB — APTT: aPTT: 30 seconds (ref 24–37)

## 2015-01-29 LAB — MRSA PCR SCREENING: MRSA by PCR: NEGATIVE

## 2015-01-29 LAB — POCT ACTIVATED CLOTTING TIME: Activated Clotting Time: 866 seconds

## 2015-01-29 LAB — LIPID PANEL
CHOL/HDL RATIO: 4.7 ratio
Cholesterol: 104 mg/dL (ref 0–200)
HDL: 22 mg/dL — ABNORMAL LOW (ref >39)
LDL Cholesterol: 61 mg/dL (ref 0–99)
Triglycerides: 103 mg/dL (ref <150)
VLDL: 21 mg/dL (ref 0–40)

## 2015-01-29 LAB — GLUCOSE, CAPILLARY: Glucose-Capillary: 121 mg/dL — ABNORMAL HIGH (ref 70–99)

## 2015-01-29 SURGERY — LEFT HEART CATHETERIZATION WITH CORONARY ANGIOGRAM
Anesthesia: LOCAL

## 2015-01-29 MED ORDER — MORPHINE SULFATE 2 MG/ML IJ SOLN
2.0000 mg | INTRAMUSCULAR | Status: DC | PRN
  Administered 2015-01-29 – 2015-02-01 (×12): 2 mg via INTRAVENOUS
  Filled 2015-01-29 (×13): qty 1

## 2015-01-29 MED ORDER — ONDANSETRON HCL 4 MG/2ML IJ SOLN
4.0000 mg | Freq: Four times a day (QID) | INTRAMUSCULAR | Status: DC | PRN
  Administered 2015-01-30: 4 mg via INTRAVENOUS

## 2015-01-29 MED ORDER — SODIUM CHLORIDE 0.9 % IV SOLN
1.7500 mg/kg/h | INTRAVENOUS | Status: AC
  Administered 2015-01-29: 1.75 mg/kg/h via INTRAVENOUS
  Filled 2015-01-29: qty 250

## 2015-01-29 MED ORDER — TICAGRELOR 90 MG PO TABS
90.0000 mg | ORAL_TABLET | Freq: Two times a day (BID) | ORAL | Status: DC
  Administered 2015-01-29 – 2015-02-01 (×6): 90 mg via ORAL
  Filled 2015-01-29 (×7): qty 1

## 2015-01-29 MED ORDER — HYDRALAZINE HCL 20 MG/ML IJ SOLN: 5.0000 mg | Freq: Four times a day (QID) | INTRAMUSCULAR | Status: DC | PRN

## 2015-01-29 MED ORDER — INSULIN GLARGINE 100 UNIT/ML ~~LOC~~ SOLN
20.0000 [IU] | Freq: Every day | SUBCUTANEOUS | Status: DC
  Administered 2015-01-29 – 2015-02-01 (×3): 20 [IU] via SUBCUTANEOUS
  Filled 2015-01-29 (×5): qty 0.2

## 2015-01-29 MED ORDER — ACETAMINOPHEN 325 MG PO TABS: 650.0000 mg | ORAL_TABLET | ORAL | Status: DC | PRN

## 2015-01-29 MED ORDER — INSULIN ASPART 100 UNIT/ML ~~LOC~~ SOLN
0.0000 [IU] | Freq: Every day | SUBCUTANEOUS | Status: DC
Start: 2015-01-29 — End: 2015-01-30

## 2015-01-29 MED ORDER — INSULIN ASPART 100 UNIT/ML ~~LOC~~ SOLN
0.0000 [IU] | Freq: Three times a day (TID) | SUBCUTANEOUS | Status: DC
Start: 2015-01-29 — End: 2015-02-01
  Administered 2015-01-29 – 2015-01-30 (×4): 1 [IU] via SUBCUTANEOUS
  Administered 2015-01-31 (×2): 2 [IU] via SUBCUTANEOUS
  Administered 2015-01-31 – 2015-02-01 (×2): 1 [IU] via SUBCUTANEOUS

## 2015-01-29 MED ORDER — SODIUM CHLORIDE 0.9 % IV SOLN
INTRAVENOUS | Status: DC
  Administered 2015-01-29: 16:00:00 via INTRAVENOUS

## 2015-01-29 MED ORDER — ATROPINE SULFATE 0.1 MG/ML IJ SOLN
INTRAMUSCULAR | Status: AC
  Filled 2015-01-29: qty 10

## 2015-01-29 MED ORDER — ASPIRIN EC 81 MG PO TBEC
81.0000 mg | DELAYED_RELEASE_TABLET | Freq: Every day | ORAL | Status: DC
  Administered 2015-01-30 – 2015-02-01 (×3): 81 mg via ORAL
  Filled 2015-01-29 (×3): qty 1

## 2015-01-29 MED ORDER — ATORVASTATIN CALCIUM 80 MG PO TABS
80.0000 mg | ORAL_TABLET | Freq: Every day | ORAL | Status: DC
  Administered 2015-01-29 – 2015-01-31 (×3): 80 mg via ORAL
  Filled 2015-01-29 (×4): qty 1

## 2015-01-29 NOTE — Progress Notes (Signed)
Pt had emesis on srrival in cath holding,12 lead ECG done and presented to dr Tresa EndoKelly.

## 2015-01-29 NOTE — Progress Notes (Signed)
Right femoral sheath pulled. Level 0. Pressure applied for 20 minutes, vital signs stable throughout. Pressure dressing applied.

## 2015-01-29 NOTE — Progress Notes (Signed)
   01/29/15 1100  Clinical Encounter Type  Visited With Patient;Family;Health care provider  Visit Type Initial;Spiritual support;Social support;Code  Stress Factors  Family Stress Factors Lack of knowledge   Chaplain responded to a code stemi for patient at 10:47 AM. Chaplain was not able to immediately respond, however, checked in with the cath lab about an hour after the page. Cath Lab team did not know of any family present for the patient. However, patient was able to speak and indicated that his wife should have been right behind him when he came in via EMS. Chaplain went to find patient's wife. Chaplain found patient's wife in the ED lobby. Patient's wife was frustrated and had been waiting for a long time without assistance or knowledge of where the patient was. Chaplain escorted patient to the 2nd floor surgical waiting room where she now has a pager to notify her of when the procedure is over. Chaplain also helped patient's son find the 2nd floor surgical waiting room. Patient's son, and patient's daughter are now waiting for the procedure to be completed. Page Merrilyn Puman-Call chaplain if the patient or the patient's family needs further support today. [place] NeighborStrother, Rylie Limburg R, Chaplain  11:57 AM

## 2015-01-29 NOTE — CV Procedure (Addendum)
[name] is a 69 y.o. male    [id number]  [id number] LOCATION:  FACILITY: [facility]  PHYSICIAN: Troy Sine, MD, [facility] [birth year]   DATE OF PROCEDURE:  01/29/2015    EMERGENT CARDIAC CATHETERIZATION/PERCUTANEOUS CORONARY INTERVENTION     HISTORY:    [name] is a 69 y.o. male who has a history of hypertension, type 2 diabetes mellitus, hyperlipidemia, morbid obesity, and coronary artery disease.  In 1995 he underwent CABG revascularization surgery and had a LIMA placed to the LAD, SVG to the diagonal, SVG to the circumflex and SVG to the RCA.  In 2009 he underwent stenting of the vein graft to the diagonal vessel.  He has been documented to have an occluded graft to the RCA and circumflex and occlusion of all his native coronary arteries with a patent left internal mammary artery graft.  He has a severe ischemic cardiomyopathy with an ejection fraction of 15%.  Patient experienced significant shortness of breath and acute onset of chest pain.  He was brought by EMS as a code STEMI and is taken acutely to the catheterization laboratory for emergent cardiac catheterization.   PROCEDURE: Left heart catheterization: coronary angiography in the native coronary arteries, selective angiography into vein grafts and selective angiography into the left internal mammary artery.  Difficult percutaneous coronary intervention to the saphenous vein graft supplying the diagonal vessel, treated with PTCA/thrombectomy/stenting with DES stent.  The patient was brought to the [facility] cardiac catherization laboratory directly from EMS in the setting of ST segment elevation inferior myocardial infarction.  His right femoral artery was punctured anteriorly and a 6 French sheath was inserted.  Heparin 4000 units was administered intravenously  Diagnostic catheterization was done utilizing 5 French JL4, JR4, left bypass graft, right bypass graft ,and LIMA catheters.  With the demonstration of an  apparent acutely occluded vein graft supplying the diagonal vessel per cutaneous coronary intervention was performed.  This was an extremely difficult procedure secondary to very poor catheter backup and guide support.  Angiomax bolus plus infusion was administered.  ACT was documented to be therapeutic.  The patient received 180 mg of oral Brilinta for anti-platelet therapy.  Ultimately, an AL-1 6 [place] guide was used, but this was never able to selectively cannulate the vessel and often remained outside the vein ostium.  A Choice PT moderate support wire was used after a Prowater was unable to cross the stenosis.  The Choice PT wire was able to cross the total occlusion and was advanced to the distal diagonal vasculature.  Initial dilatation with a 2.0 12 mm merge balloon was used and multiple dilatations throughout the mid-distal portion of the graft were done which resulted in some restoration of flow to the distal diagonal bed.  Angiography revealed significant thrombus burden despite dilatations.  A Fetch 2 catheter was then inserted for thrombectomy and several passes were performed with removal of some thrombus A 2.515 mm Emerge balloon was then used for additional dilatations with 3 inflations up to 10 atm.  There was diffuse in-stent restenosis in the previously placed stent as well as disease in the diagonal vessel that was somewhat calcified and tortuous beyond the anastomosis.  It was felt that a 38 mm stent would be necessary to cover all lesions.  A Promus premier 2.538 mm stent was then inserted and was able to be advanced to the beyond the previously placed stent, but was never able to completely extend to the most distal aspect of the  calcified lesion in the diagonal vessel.  Multiple passes were attempted but there was extremely poor catheter backup and this was unsuccessful.  The stent was then removed undeployed.  A [employer] Guidedzilla 6 [place] catheter was then inserted in attempt  to provide additional support.  This was advanced to the distal portion of the vein graft.  The Promus was then reinserted over the wire and through the guide silhouette support to the distal graft.  Some slight additional progress was made but again the distal portion of the stent could not reach the most distal aspect of the tortuous lesion.  There continue to be significant difficulty with catheter backup and poor visualization.  Ultimately, the Guidezilla catheter was moved back into the proximal graft.  The stent was successfully deployed in the distal portion of the graft into to cover the anastomosis and into the proximal diagonal vessel, but again could not cover the most distal aspect of the lesion.  This was deployed up to 14   atm for 2 inflations.  A [place] Emerge balloon 2.7520 mm was used for post stent dilatation.  A 2.2512 mm Promus premier DES stent was then inserted but this was never able to get beyond the distal aspect of the stent to cover the most distal unstented area of stenosis.  This stent was then removed undeployed.  The original Emerge 2.012 mm balloon was then reinserted.  And with the guide silhouette support was able to pass beyond the distal aspect of the stent and dilatation 2 and 8 atm was performed in this non-stent segment.  The 2.2512 mm Promus premier DES stent was then reinserted, but despite dilatation of his distal aspect.  The stent was never able to traverse into this segment due to the tortuosity and probable area of calcification.  The stent was again removed undeployed.  The previously inserted 2.512 mm Emerge balloon was then reinserted, but due to significant winging of the balloon this was never able to traverse into this segment beyond the stented region.  This balloon was then removed.  A new Emerge 2.512 mm balloon was then advanced and was able to dilate this segment; 5 inflations up to 8 atm were made in the distal aspect of the stent and beyond the stented  region to dilate this non-stented segment.  The 2.2512 mm Promus DES stent was again reinserted but despite additional dilatation of the region with the 2.5 mm balloon, again the stent was never able to be advanced.  Angiography demonstrated a good PTCA result to this region.  Beyond the stent and was felt that no further attempts at trying to advance a stent should be made.  At this point, the patient had been in the lab for almost 3 hours.  Throughout the procedure the patient had significant difficulty lying flat.  He experienced continued right shoulder discomfort throughout most of the procedure.  He required numerous medications for sedation and pain relief including a total of 5 mg of Versed, 225 g of fentanyl, and had received doses of 0.5 mg of Dilaudid  for total dose of 2.5 mg. A this point, he needed to get off the table.  He was not having any further chest pain.  Due to his severe sleep documented ischemic cardiomyopathy with an ejection fraction of 15%, left ventriculography was not performed.  The arterial sheath was sutured in place.  He left the catheterization laboratory chest pain-free with stable hemodynamics.  HEMODYNAMICS:   Central Aorta: 123/73  ANGIOGRAPHY:  Left main: Large vessel which was completely occluded after the takeoff of a diminutive occluded proximal circumflex.  LAD: Total occlusion at its ostium.  Left circumflex: Totally occluded proximally.  Right coronary artery: Totally occluded near the ostium with faint collateralization in a bridging fashion to the mid RCA.  LIMA to LAD: Large caliber widely patent graft which supplied the mid LAD.  There also was collaterals from this distal LAD which seem to supply a portion of the circumflex and possible diagonal distribution.  SVG to circumflex: Occluded at its origin.  SVG to Diagonal: Occluded in the mid distal portion of the graft prior to the previously placed stent from 2009 insertion.  SVG to RCA:  Occluded at its origin.   Following difficult percutaneous cardiac intervention, the saphenous vein graft supplying the diagonal vessel, which was 100% occluded and had TIMI 0 flow with significant thrombus burden was opened.  The 2.538 mm Promus premier DES stent region was opened to 0%.  There was brisk TIMI-3 flow.  The small segment beyond the stented region which had undergone PTCA was reduced to approximately 20-30%.  The diagonal vessel was small caliber and had multiple small branches beyond its anastomosis.   IMPRESSION:  Acute coronary syndrome with ST segment elevation inferiorly in this patient with previously documented severe ischemic cardiopathy and an ejection fraction of 15%.  Severe native coronary artery disease with total occlusion of all native vessels.  Widely patent left internal artery graft supplying the LAD with some collateralization from the distal LAD to the circumflex and diagonal system.  Old total occlusion of the vein graft which had supplied the circumflex vessel.  Old total occlusion of the vein graft which had supplied the RCA.  Acute culprit lesion occlusion of the vein graft which had been previously stented in 2009 which had supplied the diagonal vessel.  Very difficult but successful PCI of the vein graft supplying the diagonal vessel with PTCA, thrombectomy, and insertion of a 2.538 mm Promus premier DES stent postdilated to 2.75 mm with the 100% occlusion being reduced to 0% and PTCA of the lesion beyond the stented segment being reduced to 20-30% with restoration of TIMI-3 flow.  RECOMMENDATION:  The patient will require aggressive medical management for severe coronary obstructive disease including ACE inhibition, carvedilol, aggressive statin therapy, and nitrates. Ranolazine should be considered added to his medical regimen in light of significant native as well as graft occlusion.  Additional discussion concerning prophylactic ICD implantation  is necessary in light of his high risk for arrhythmia mediated sudden cardiac death.  His Plavix was changed to Brilinta for more aggressive antiplatelet therapy.  He should continue lifelong dual antiplatelet therapy.   Troy Sine, MD, [facility] 01/29/2015 7:52 PM

## 2015-01-29 NOTE — H&P (Signed)
History and Physical  Patient ID: NICHAEL EHLY MRN: [id number], SOB: 05-Feb-1946 69 y.o. Date of Encounter: 01/29/2015, 1:57 PM  Primary Physician: Devra Dopp, MD Primary Cardiologist: Dr. Thurmon Fair, MD  Chief Complaint: Chest pain  HPI: 69 y.o. male w/ PMHx significant for HTN, DM type II, HLD, CAD s/p CABG in 1995 (LIMA to LAD, SVG to diagonal, re-stented in 2009, other grafts occluded), and ICM w/ EF 15% (ECHO from 2009), who presented to [facility] on 01/29/2015 with complaints of chest pain. Patient states he started feeling poorly this AM when he was taking his RV to refill the propane. Admits to significant diaphoresis, mild SOB, and acute onset chest pain, severe, burning in nature, centrally located. States he had this pain before associated w/ his previous MI's. Also admits to some mild nausea and vomiting.    Past Medical History  Diagnosis Date  . Ischemic cardiomyopathy   . CAD (coronary artery disease)   . CHF (congestive heart failure)   . Systemic hypertension   . Chronic kidney disease   . DM (diabetes mellitus)   . Morbid obesity   . CAD s/p CABG 1995, graft dependent (LIMA to LAD, SVG to diagonal stented, other grafts occluded) 01/25/2014    Angiography 2009 with total occlusion of all 3 proximal native coronary arteries, occlusion of the SVG to RCA and SVG to circumflex, patent LIMA to LAD (collaterals to the distal RCA and OM) and stenotic SVG to first diagonal treated with a drug-eluting Taxus atom stent  . Cardiomyopathy, ischemic 01/25/2014    LVEF 15%  . Chronic systolic CHF (congestive heart failure), NYHA class 2 01/25/2014  . DM type 2 causing CKD stage 3 01/25/2014  . Gout 01/25/2014  . Dyslipidemia - low HDL and high triglycerides 01/25/2014     Surgical History:  Past Surgical History  Procedure Laterality Date  . Coronary artery bypass graft  1995  . Coronary angioplasty with stent placement  01/12/2008    multivessel CAD occluded vein  grafts to multiple sites including RCA,obtuse marginal branch & CX.  Successful PTCA and stenting distal graft.     Home Meds: Prior to Admission medications   Medication Sig Start Date End Date Taking? Authorizing Provider  allopurinol (ZYLOPRIM) 100 MG tablet Take 100 mg by mouth daily.    Historical Provider, MD  aspirin 81 MG tablet Take 81 mg by mouth daily.    Historical Provider, MD  atorvastatin (LIPITOR) 80 MG tablet Take 1 tablet (80 mg total) by mouth daily. 01/25/14   Mihai Croitoru, MD  cholecalciferol (VITAMIN D) 1000 UNITS tablet Take 1,000 Units by mouth every other day.    Historical Provider, MD  clopidogrel (PLAVIX) 75 MG tablet Take 1 tablet (75 mg total) by mouth daily. 01/25/14   Mihai Croitoru, MD  furosemide (LASIX) 40 MG tablet Take 1 tablet (40 mg total) by mouth daily. 01/25/14   Mihai Croitoru, MD  insulin aspart (NOVOLOG) 100 UNIT/ML injection Inject 32 Units into the skin daily.    Historical Provider, MD  INVOKANA 100 MG TABS Take 100 mg by mouth daily. 12/28/13   Historical Provider, MD  LANTUS 100 UNIT/ML injection Inject 32 Units into the skin daily. 12/28/13   Historical Provider, MD  lisinopril (PRINIVIL,ZESTRIL) 10 MG tablet Take 1 tablet (10 mg total) by mouth daily. 01/25/14   Mihai Croitoru, MD  nebivolol (BYSTOLIC) 10 MG tablet Take 1 tablet (10 mg total) by mouth daily. 01/25/14  Mihai Croitoru, MD  Omega-3 Fatty Acids (FISH OIL) 1200 MG CAPS Take by mouth daily.    Historical Provider, MD  spironolactone (ALDACTONE) 25 MG tablet Take 1 tablet (25 mg total) by mouth daily. 01/25/14   Mihai Croitoru, MD  VICTOZA 18 MG/3ML SOPN Inject 1.25 mg into the skin daily. 12/28/13   Historical Provider, MD    Allergies:  Allergies  Allergen Reactions  . Coreg [Carvedilol]     History   Social History  . Marital Status: Married    Spouse Name: N/A  . Number of Children: N/A  . Years of Education: N/A   Occupational History  . Not on file.   Social History Main  Topics  . Smoking status: Current Every Day Smoker  . Smokeless tobacco: Not on file  . Alcohol Use: Yes     Comment: occas.  . Drug Use: No  . Sexual Activity: Not on file   Other Topics Concern  . Not on file   Social History Narrative  . No narrative on file     Family History  Problem Relation Age of Onset  . Heart failure Mother     Review of Systems: General: Positive for diaphoresis and fatigue. Denies fever and appetite change.  Respiratory: Positive for SOB. Denies cough, and wheezing.   Cardiovascular: Positive for chest pain. Denies palpitations.  Gastrointestinal: Positive for nausea and vomiting. Denies abdominal pain, and diarrhea Musculoskeletal: Denies myalgias, arthralgias, back pain, and gait problem.  Neurological: Positive for generalized weakness. Denies dizziness, syncope, lightheadedness, and headaches.  Psychiatric/Behavioral: Denies mood changes, sleep disturbance, and agitation.   Physical Exam: Weight 383 lb 9.6 oz (174 kg). General: Morbidly obese AA male, alert and oriented, in no acute distress. Fatigued s/p LHC.  HEENT: Normocephalic, atraumatic, sclera anicteric Neck: Supple. Carotids 2+ without bruits. JVP normal Lungs: Clear bilaterally to auscultation in anterior lung fields without wheezes, rales, or rhonchi. Breathing is unlabored. Heart: Bradycardic with normal S1 and S2. No murmurs, rubs, or gallops appreciated. Remote sternotomy scar present.  Abdomen: Obese, soft, non-tender, non-distended with normoactive bowel sounds. No hepatomegaly. No rebound/guarding. No obvious abdominal masses. Msk:  Strength and tone appear normal for age. Extremities: No clubbing, cyanosis, or edema.  Distal pedal pulses are 2+ and equal bilaterally. Right femoral cath site clean and dry. No obvious drainage or bleeding.  Neuro: CNII-XII intact, moves all extremities spontaneously. Psych:  Responds to questions appropriately with a normal affect. Skin: Warm  and dry without rash   Labs:   Lab Results  Component Value Date   WBC 12.7* 01/29/2015   HGB 17.1* 01/29/2015   HCT 51.0 01/29/2015   MCV 86.9 01/29/2015   PLT 164 01/29/2015    Recent Labs Lab 01/29/15 1131  NA 135  K 4.0  CL 105  CO2 19  BUN 18  CREATININE 1.80*  CALCIUM 9.4  PROT 7.1  BILITOT 0.8  ALKPHOS 46  ALT 21  AST 33  GLUCOSE 227*    Recent Labs  01/29/15 1130  CKTOTAL 766*  CKMB 8.3*  TROPONINI 0.07*   Lab Results  Component Value Date   CHOL 104 01/29/2015   HDL 22* 01/29/2015   LDLCALC 61 01/29/2015   TRIG 103 01/29/2015      CARDIAC STUDIES:  Cath note pending  ASSESSMENT AND PLAN:  69 y.o. male w/ PMHx significant for HTN, DM type II, HLD, CAD s/p CABG in 1995 (LIMA to LAD, SVG to diagonal, re-stented in 2009, other  grafts occluded), and ICM w/ EF 15% (ECHO from 2009), admitted 01/29/15 for STEMI.  STEMI: Remote h/o CABG in 1995 w/ SVG to diag, LIMA to LAD, had SVG to diag re-stented in 2009, presented today w/ severe chest pain, SOB, nausea, and diaphoresis. Code STEMI taken to the cath lab emergently. Received PCI by Dr. Tresa EndoKelly to diagonal branch. Cath note pending. -Admit to CCU -IVF for now; 75 cc/hr for 1L -Continue ASA, started on Brilinta 90 mg bid -Cycle troponins -Hold BB in setting of bradycardia. -Continue Lipitor 80 mg qhs -Cardiac diet -Morphine prn for pain  ICM: Most recent ECHO from 2009 shows EF of 15% w/ diffuse left ventricular hypokinesis. Patient takes Lasix 40 mg daily, Lisinopril 10 mg daily, Bystolic 10 mg daily, and Spironolactone 25 mg daily. No LE edema on exam, lungs sound clear to auscultation. Claims to have been compliant w/ home medications.  -Repeat ECHO w/contrast -Restart Lisinopril 10 mg daily, Spironolactone 25 mg daily in AM -Restart Lasix 40 mg daily in AM  DM type II: Most recent HbA1c in EPIC 6.6 in 2009. On Lantus 32 units daily, Invokana 100 mg daily, and Victoza 1.25 mg inj daily. Follows w/  Dr. Providence LaniusHowell, PCP.  -Repeat HbA1c -ISS-S + CBG's AC/HS -Restart Lantus 25 units when able to tolerate po  AKI: Baseline Cr close to 1.25, now 1.80.  -Gentle IVF as above -Repeat BMP in AM -Restart Lasix, Lisinopril in AM as above  HTN: BP slightly elevated s/p LHC.  -Hydralazine prn for elevated BP -Can restart BB as HR tolerates   Signed, Lars MassonJones, [place] MD 01/29/2015, 1:57 PM   Patient seen and examined. Agree with assessment and plan.  Mr. Jethro Polingorwood is a 69 year old African-American male who is status post remote CABG revascularization surgery in 1995.  He has a documented severe ischemic coronary myopathy and has previously been documented to have occlusion of his native coronary arteries as well as the vein graft supplying his circumflex and RCA.  He is status post stenting to the vein graft supplying the diagonal vessel and at last cath had a patent LIMA graft supplying his LAD.  The patient has refused ICD therapy for primary prevention of sudden cardiac death.  He presented today in the setting of an inferior ST segment elevation myocardial infarction.  Acute catheterization revealed total occlusion of the vein graft supplying the diagonal vessel.  He underwent difficult but ultimately successful intervention with restoration of flow.  An echo Doppler study will be obtained to assess LV function.  In this patient with previous documentation of an ejection fraction of 15%.  Renal function will need to be monitored closely.  Her to discussion concerning ICD prophylaxis should be entertained with the patient and family.   Lennette Biharihomas A. Kyann Heydt, MD, [facility] 01/29/2015 7:47 PM

## 2015-01-30 ENCOUNTER — Encounter (HOSPITAL_COMMUNITY): Payer: Self-pay

## 2015-01-30 DIAGNOSIS — I2109 ST elevation (STEMI) myocardial infarction involving other coronary artery of anterior wall: Secondary | ICD-10-CM

## 2015-01-30 DIAGNOSIS — I379 Nonrheumatic pulmonary valve disorder, unspecified: Secondary | ICD-10-CM

## 2015-01-30 LAB — TROPONIN I
TROPONIN I: 19.16 ng/mL — AB (ref <0.031)
TROPONIN I: 13.88 ng/mL — AB (ref <0.031)
TROPONIN I: 10.51 ng/mL — AB (ref <0.031)
Troponin I: 21.69 ng/mL (ref <0.031)
Troponin I: 10.01 ng/mL (ref <0.031)

## 2015-01-30 LAB — POCT I-STAT, CHEM 8
BUN: 21 mg/dL (ref 6–23)
CREATININE: 1.6 mg/dL — AB (ref 0.50–1.35)
Calcium, Ion: 1.29 mmol/L (ref 1.13–1.30)
Chloride: 104 mmol/L (ref 96–112)
Glucose, Bld: 232 mg/dL — ABNORMAL HIGH (ref 70–99)
HCT: 57 % — ABNORMAL HIGH (ref 39.0–52.0)
HEMOGLOBIN: 19.4 g/dL — AB (ref 13.0–17.0)
Potassium: 4 mmol/L (ref 3.5–5.1)
SODIUM: 137 mmol/L (ref 135–145)
TCO2: 18 mmol/L (ref 0–100)

## 2015-01-30 LAB — HEMOGLOBIN A1C
Hgb A1c MFr Bld: 6.1 % — ABNORMAL HIGH (ref 4.8–5.6)
MEAN PLASMA GLUCOSE: 128 mg/dL

## 2015-01-30 LAB — CBC
HCT: 49 % (ref 39.0–52.0)
Hemoglobin: 16.1 g/dL (ref 13.0–17.0)
MCH: 28.7 pg (ref 26.0–34.0)
MCHC: 32.9 g/dL (ref 30.0–36.0)
MCV: 87.3 fL (ref 78.0–100.0)
Platelets: 158 10*3/uL (ref 150–400)
RBC: 5.61 MIL/uL (ref 4.22–5.81)
RDW: 15.2 % (ref 11.5–15.5)
WBC: 12.6 10*3/uL — ABNORMAL HIGH (ref 4.0–10.5)

## 2015-01-30 LAB — GLUCOSE, CAPILLARY
GLUCOSE-CAPILLARY: 149 mg/dL — AB (ref 70–99)
Glucose-Capillary: 146 mg/dL — ABNORMAL HIGH (ref 70–99)
Glucose-Capillary: 133 mg/dL — ABNORMAL HIGH (ref 70–99)
Glucose-Capillary: 177 mg/dL — ABNORMAL HIGH (ref 70–99)

## 2015-01-30 LAB — BASIC METABOLIC PANEL
Anion gap: 8 (ref 5–15)
BUN: 16 mg/dL (ref 6–23)
CALCIUM: 8.9 mg/dL (ref 8.4–10.5)
CO2: 26 mmol/L (ref 19–32)
Chloride: 102 mmol/L (ref 96–112)
Creatinine, Ser: 1.46 mg/dL — ABNORMAL HIGH (ref 0.50–1.35)
GFR calc Af Amer: 56 mL/min — ABNORMAL LOW (ref >90)
GFR calc non Af Amer: 48 mL/min — ABNORMAL LOW (ref >90)
Glucose, Bld: 143 mg/dL — ABNORMAL HIGH (ref 70–99)
POTASSIUM: 4.4 mmol/L (ref 3.5–5.1)
SODIUM: 136 mmol/L (ref 135–145)

## 2015-01-30 MED ORDER — NEBIVOLOL HCL 2.5 MG PO TABS
2.5000 mg | ORAL_TABLET | Freq: Every day | ORAL | Status: DC
  Administered 2015-01-30 – 2015-01-31 (×2): 2.5 mg via ORAL
  Filled 2015-01-30 (×2): qty 1

## 2015-01-30 MED ORDER — PERFLUTREN LIPID MICROSPHERE
1.0000 mL | INTRAVENOUS | Status: AC | PRN
  Administered 2015-01-30 (×2): 2 mL via INTRAVENOUS
  Filled 2015-01-30: qty 10

## 2015-01-30 MED ORDER — PERFLUTREN LIPID MICROSPHERE
INTRAVENOUS | Status: AC
  Filled 2015-01-30: qty 10

## 2015-01-30 MED ORDER — CETYLPYRIDINIUM CHLORIDE 0.05 % MT LIQD
7.0000 mL | Freq: Two times a day (BID) | OROMUCOSAL | Status: DC
  Administered 2015-01-30 – 2015-02-01 (×5): 7 mL via OROMUCOSAL

## 2015-01-30 MED ORDER — HEPARIN SODIUM (PORCINE) 5000 UNIT/ML IJ SOLN
5000.0000 [IU] | Freq: Three times a day (TID) | INTRAMUSCULAR | Status: DC
  Administered 2015-01-30 – 2015-02-01 (×6): 5000 [IU] via SUBCUTANEOUS
  Filled 2015-01-30 (×8): qty 1

## 2015-01-30 MED FILL — Midazolam HCl Inj 2 MG/2ML (Base Equivalent): INTRAMUSCULAR | Qty: 2 | Status: AC

## 2015-01-30 MED FILL — Fentanyl Citrate Inj 0.05 MG/ML: INTRAMUSCULAR | Qty: 2 | Status: AC

## 2015-01-30 MED FILL — Hydromorphone HCl Inj 1 MG/ML: INTRAMUSCULAR | Qty: 1 | Status: AC

## 2015-01-30 MED FILL — Bivalirudin For IV Soln 250 MG: INTRAVENOUS | Qty: 250 | Status: AC

## 2015-01-30 MED FILL — Ticagrelor Tab 90 MG: ORAL | Qty: 2 | Status: AC

## 2015-01-30 MED FILL — Nitroglycerin IV Soln 100 MCG/ML in D5W: INTRA_ARTERIAL | Qty: 10 | Status: AC

## 2015-01-30 MED FILL — Heparin Sodium (Porcine) 2 Unit/ML in Sodium Chloride 0.9%: INTRAMUSCULAR | Qty: 1000 | Status: AC

## 2015-01-30 MED FILL — Sodium Chloride IV Soln 0.9%: INTRAVENOUS | Qty: 50 | Status: AC

## 2015-01-30 MED FILL — Lidocaine HCl Local Preservative Free (PF) Inj 1%: INTRAMUSCULAR | Qty: 30 | Status: AC

## 2015-01-30 NOTE — Progress Notes (Signed)
Progress Note  Subjective:    Feels much better this AM. Less SOB, no chest pain. BP somewhat soft today but improved from yesterday.   Objective:   Temp:  [97.5 F (36.4 C)-98.3 F (36.8 C)] 98.3 F (36.8 C) (02/11 0400) Pulse Rate:  [30-71] 30 (02/11 0507) Resp:  [0-24] 16 (02/11 0507) BP: (84-159)/(42-111) 91/72 mmHg (02/11 0507) SpO2:  [93 %-100 %] 97 % (02/11 0507) Weight:  [383 lb 9.6 oz (174 kg)] 383 lb 9.6 oz (174 kg) (02/10 1630)    Filed Weights   01/29/15 1100 01/29/15 1630  Weight: 383 lb 9.6 oz (174 kg) 383 lb 9.6 oz (174 kg)    Intake/Output Summary (Last 24 hours) at 01/30/15 0815 Last data filed at 01/30/15 0400  Gross per 24 hour  Intake  907.5 ml  Output    950 ml  Net  -42.5 ml    Telemetry: NSR  Physical Exam: General: Morbidly obese male, alert, cooperative, NAD. HEENT: PERRL, EOMI. Moist mucus membranes Neck: Full range of motion without pain, supple, no lymphadenopathy or carotid bruits Lungs: Clear to ascultation bilaterally, normal work of respiration, no wheezes, rales, rhonchi Heart: RRR, no murmurs, gallops, or rubs. Remote sternotomy scar present.  Abdomen: Obese, soft, non-tender, non-distended, BS + Extremities: No cyanosis, clubbing, or edema. Right femoral cath site clean and dry. No obvious drainage or bleeding.  Neurologic: Alert & oriented x3, cranial nerves II-XII intact, strength grossly intact, sensation intact to light touch   Lab Results:  Basic Metabolic Panel:  Recent Labs Lab 01/29/15 1131 01/30/15 0300  NA 135 136  K 4.0 4.4  CL 105 102  CO2 19 26  GLUCOSE 227* 143*  BUN 18 16  CREATININE 1.80* 1.46*  CALCIUM 9.4 8.9    Liver Function Tests:  Recent Labs Lab 01/29/15 1131  AST 33  ALT 21  ALKPHOS 46  BILITOT 0.8  PROT 7.1  ALBUMIN 3.7    CBC:  Recent Labs Lab 01/29/15 1130 01/30/15 0300  WBC 12.7* 12.6*  HGB 17.1* 16.1  HCT 51.0 49.0  MCV 86.9 87.3  PLT 164 158    Cardiac  Enzymes:  Recent Labs Lab 01/29/15 1130 01/29/15 2219 01/30/15 0300  CKTOTAL 766*  --   --   CKMB 8.3*  --   --   TROPONINI 0.07* 19.16* 21.69*    Coagulation:  Recent Labs Lab 01/29/15 1131  INR 1.01     Medications:   Scheduled Medications: . aspirin EC  81 mg Oral Daily  . atorvastatin  80 mg Oral q1800  . insulin aspart  0-5 Units Subcutaneous QHS  . insulin aspart  0-9 Units Subcutaneous TID WC  . insulin glargine  20 Units Subcutaneous QHS  . ticagrelor  90 mg Oral BID     Infusions: . sodium chloride 75 mL/hr at 01/29/15 1600     PRN Medications:  acetaminophen, hydrALAZINE, morphine injection, ondansetron (ZOFRAN) IV   Assessment and Plan:  69 y.o. male w/ PMHx significant for HTN, DM type II, HLD, CAD s/p CABG in 1995 (LIMA to LAD, SVG to diagonal, re-stented in 2009, other grafts occluded), and ICM w/ EF 15% (ECHO from 2009), admitted 01/29/15 for STEMI.  STEMI: Remote h/o CABG in 1995 w/ SVG to diag, LIMA to LAD, had SVG to diag re-stented in 2009, presented 01/29/15 w/ severe chest pain, SOB, nausea, and diaphoresis. Code STEMI taken to the cath lab emergently. Received PCI by Dr. Tresa EndoKelly to diagonal  branch. Troponin trend as follows:    Recent Labs Lab 01/29/15 1130 01/29/15 2219 01/30/15 0300  TROPONINI 0.07* 19.16* 21.69*  -Continue to cycle troponins -Continue ASA, started on Brilinta 90 mg bid -Restart Bystolic 2.5 mg daily for now -Continue Lipitor 80 mg qhs -Cardiac diet -Morphine prn for pain  ICM: Most recent ECHO from 2009 shows EF of 15% w/ diffuse left ventricular hypokinesis. Patient takes Lasix 40 mg daily, Lisinopril 10 mg daily, Bystolic 10 mg daily, and Spironolactone 25 mg daily. No LE edema on exam, lungs sound clear to auscultation. Claims to have been compliant w/ home medications.  -Repeat ECHO w/contrast today -Bystolic as above -Restart Lisinopril 10 mg daily, Lasix 40 mg daily, and Spironolactone 25 mg daily as BP  tolerates  DM type II: Repeat HbA1c 6.1. On Lantus 32 units daily, Invokana 100 mg daily, and Victoza 1.25 mg inj daily. Follows w/ Dr. Providence Lanius, PCP.  -ISS-S + CBG's AC/HS -Lantus 20 units qhs  AKI: Baseline Cr close to 1.25, trend as follows:   Recent Labs Lab 01/29/15 1131 01/30/15 0300  CREATININE 1.80* 1.46*  -Restart Lasix, Lisinopril when BP tolerates.   HTN: BP soft this AM.  -Bystolic 2.5 mg daily.  -Hydralazine prn for elevated BP  Lauris Chroman, MD PGY-2 Internal Medicine Pager: [phone]  Patient seen, examined. Available data reviewed. Agree with findings, assessment, and plan as outlined by Lauris Chroman, MD. Exam reveals pleasant obese male in NAD. Lungs are clear and heart is RRR without murmur. There is mild pretibial edema present. Cath data, labs, and radiographic data reviewed.   BP is marginal today. I think best to hold ACE/aldactone as risk of AKI is high after contrast administration with hypotension. Suspect baseline BP is low with LVEF 15%. Will add back low-dose Bystolic and follow labs tomorrow am. Slowly add back home regimen. 2D Echo today. Overall looks good considering STEMI with SVG occlusion on background of severe cardiomyopathy.   Tonny Bollman, M.D. 01/30/2015 12:33 PM

## 2015-01-30 NOTE — Progress Notes (Signed)
  Echocardiogram 2D Echocardiogram has been performed.  Janalyn HarderWest, Konica Stankowski R 01/30/2015, 5:25 PM

## 2015-01-30 NOTE — Progress Notes (Signed)
CARDIAC REHAB PHASE I   PRE:  Rate/Rhythm: 66 SR PVCs  BP:  Supine: 86/40 leg, 115/54 arm  Sitting:   Standing:    SaO2: 97% 2L  MODE:  Ambulation: 210 ft   POST:  Rate/Rhythm: 112  BP:  Supine:   Sitting: 115/53  Standing:    SaO2: 99%RA 1425-1530 Pt walked 210 ft on RA with steady gait. Encouraged slow deep breaths. Changed gown at pt's request. Leads changed several times. To chair for family to make bed and then helped pt back to bed. Will follow up tomorrow.   Luetta Nuttingharlene Maryelizabeth Eberle, RN BSN  01/30/2015 3:25 PM

## 2015-01-31 ENCOUNTER — Other Ambulatory Visit: Payer: Self-pay

## 2015-01-31 ENCOUNTER — Ambulatory Visit: Payer: Medicare Other | Admitting: Cardiovascular Disease

## 2015-01-31 DIAGNOSIS — I2510 Atherosclerotic heart disease of native coronary artery without angina pectoris: Secondary | ICD-10-CM

## 2015-01-31 DIAGNOSIS — N179 Acute kidney failure, unspecified: Secondary | ICD-10-CM

## 2015-01-31 LAB — HEMOGLOBIN A1C
Hgb A1c MFr Bld: 6.1 % — ABNORMAL HIGH (ref 4.8–5.6)
Mean Plasma Glucose: 128 mg/dL

## 2015-01-31 LAB — BASIC METABOLIC PANEL
Anion gap: 7 (ref 5–15)
BUN: 17 mg/dL (ref 6–23)
CHLORIDE: 105 mmol/L (ref 96–112)
CO2: 22 mmol/L (ref 19–32)
Calcium: 8.2 mg/dL — ABNORMAL LOW (ref 8.4–10.5)
Creatinine, Ser: 1.48 mg/dL — ABNORMAL HIGH (ref 0.50–1.35)
GFR calc Af Amer: 55 mL/min — ABNORMAL LOW (ref >90)
GFR calc non Af Amer: 47 mL/min — ABNORMAL LOW (ref >90)
Glucose, Bld: 138 mg/dL — ABNORMAL HIGH (ref 70–99)
POTASSIUM: 4.1 mmol/L (ref 3.5–5.1)
SODIUM: 134 mmol/L — AB (ref 135–145)

## 2015-01-31 LAB — GLUCOSE, CAPILLARY
GLUCOSE-CAPILLARY: 123 mg/dL — AB (ref 70–99)
GLUCOSE-CAPILLARY: 169 mg/dL — AB (ref 70–99)
Glucose-Capillary: 159 mg/dL — ABNORMAL HIGH (ref 70–99)

## 2015-01-31 LAB — TROPONIN I
TROPONIN I: 10.03 ng/mL — AB (ref <0.031)
Troponin I: 8.87 ng/mL (ref <0.031)
Troponin I: 6.91 ng/mL (ref <0.031)

## 2015-01-31 LAB — POCT ACTIVATED CLOTTING TIME: ACTIVATED CLOTTING TIME: 165 s

## 2015-01-31 MED ORDER — PERFLUTREN LIPID MICROSPHERE
1.0000 mL | INTRAVENOUS | Status: AC | PRN
  Administered 2015-01-31 (×2): 2 mL via INTRAVENOUS
  Filled 2015-01-31: qty 10

## 2015-01-31 MED ORDER — SPIRONOLACTONE 12.5 MG HALF TABLET
12.5000 mg | ORAL_TABLET | Freq: Every day | ORAL | Status: DC
  Administered 2015-01-31 – 2015-02-01 (×2): 12.5 mg via ORAL
  Filled 2015-01-31 (×2): qty 1

## 2015-01-31 MED ORDER — BISOPROLOL FUMARATE 5 MG PO TABS
2.5000 mg | ORAL_TABLET | Freq: Every day | ORAL | Status: DC
  Administered 2015-02-01: 2.5 mg via ORAL
  Filled 2015-01-31: qty 0.5

## 2015-01-31 MED ORDER — PERFLUTREN LIPID MICROSPHERE
INTRAVENOUS | Status: AC
  Filled 2015-01-31: qty 10

## 2015-01-31 MED ORDER — LISINOPRIL 2.5 MG PO TABS
2.5000 mg | ORAL_TABLET | Freq: Every day | ORAL | Status: DC
  Administered 2015-01-31 – 2015-02-01 (×2): 2.5 mg via ORAL
  Filled 2015-01-31 (×2): qty 1

## 2015-01-31 NOTE — Progress Notes (Signed)
Echocardiogram 2D Echocardiogram has been performed.  Leta JunglingCooper, Zairah Arista M 01/31/2015, 10:52 AM

## 2015-01-31 NOTE — Progress Notes (Signed)
Patient is A/Ox4 and is ambulatory without assistance. He is a transfer from 2 H and arrived to the unit around 1723. Patient was initially admitted for STEMI.  Received call from lab about critical troponin or 7.99 which is lower than previous troponin lab draws . [facility] call [facility] and left message with the answering service regarding patient's troponin level. Didn't receive call back. During bedside report patient had report of left shoulder pain and chest pain. Patient reported having this pain earlier this morning but believes it was due to his laying position in bed. EKG was still done. Oncoming night shift RN gave pain medication and will follow up.

## 2015-01-31 NOTE — Progress Notes (Signed)
Mr [name] is complaining of chest wall and left shoulder pain.  Worse when chest wall is palpated and with left arm movement.  EKG unchanged since PTCA but reads Acute MI at top.  Mr [name] describes his pain as "more of a discomfort than pain",  very different from his angina and has been present since the procedure.  Has received IV morphine for this pain during this admission and reports that he would like some now.  2mg  IV morphine given for a pain rated at 8 out of 10.  Immediate reduction in pain reported by patient.  EKG placed on chart and Dr Ace GinsVora notified. I will continue to monitor him for changes throughout the night.

## 2015-01-31 NOTE — Progress Notes (Addendum)
CARDIAC REHAB PHASE I   PRE:  Rate/Rhythm: 64 SR with PVC's  BP:  Supine: 98/38  Sitting:   Standing:    SaO2: 100 2L  MODE:  Ambulation: 270 ft   POST:  Rate/Rhythm: 100 SR with PVC's  BP:  Supine:   Sitting: 116/78  Standing:    SaO2: 99 RA 1225-1325 Assisted X 1 to ambulate. Gait steady. Pt DOE. He was able to walk 270 feet without c/o of pain or SOB. He states that his SOB is much better. RA sat after walk 99%, O2 left off.Pt seems very deconditioned. Started MI, CHF and stent education. We discussed smoking cessation. Pt states that he likes to smoke and that he does not smoke much sometimes just 3-4 per day. He states that he knows he needs to quit. I gave him tips for quitting, coaching contact number and quit smart class information. Pt states that his wife smokes and he feels that it will be difficult for him to quit.I gave pt CHF packet. We discussed daily weights, CHF zones, low sodium and carb diet, fluid restrictions, when to call MD and 911. Pt states that he does not eat salt but his family has brought in fast food for him biscuits and pancakes. He drinks a lot of water and ask for it continuously. He is not interested in Outpt. CRP. States that he has [employer] program through his insurance and that he plans to join the gym. I doubt compliance with diet or fluid restrictions. We will continue to follow pt.  Melina CopaLisa Luca Dyar RN 01/31/2015 1:45 PM

## 2015-01-31 NOTE — Progress Notes (Signed)
Patient ID: [name], male   DOB: 07-Jul-1946, 69 y.o.   MRN: [id number]   SUBJECTIVE: Doing well this morning, no chest pain and no dyspnea.  BP running a bit higher today. No labs for today yet but creatinine was coming down yesterday.   Scheduled Meds: . antiseptic oral rinse  7 mL Mouth Rinse BID  . aspirin EC  81 mg Oral Daily  . atorvastatin  80 mg Oral q1800  . [START ON 02/01/2015] bisoprolol  2.5 mg Oral Daily  . heparin subcutaneous  5,000 Units Subcutaneous 3 times per day  . insulin aspart  0-9 Units Subcutaneous TID WC  . insulin glargine  20 Units Subcutaneous QHS  . lisinopril  2.5 mg Oral Daily  . ticagrelor  90 mg Oral BID   Continuous Infusions:  PRN Meds:.acetaminophen, hydrALAZINE, morphine injection, ondansetron (ZOFRAN) IV    Filed Vitals:   01/31/15 0400 01/31/15 0500 01/31/15 0600 01/31/15 0820  BP: 119/94  142/90 99/58  Pulse: 66 61 61 30  Temp:    97.5 F (36.4 C)  TempSrc:    Oral  Resp: Height:      Weight:      SpO2: 97% 98% 96% 97%    Intake/Output Summary (Last 24 hours) at 01/31/15 0933 Last data filed at 01/31/15 0423  Gross per 24 hour  Intake      0 ml  Output   1000 ml  Net  -1000 ml    LABS: Basic Metabolic Panel:  Recent Labs  62/95/28 1131 01/30/15 0300  NA 135 136  K 4.0 4.4  CL 105 102  CO2 19 26  GLUCOSE 227* 143*  BUN 18 16  CREATININE 1.80* 1.46*  CALCIUM 9.4 8.9   Liver Function Tests:  Recent Labs  01/29/15 1131  AST 33  ALT 21  ALKPHOS 46  BILITOT 0.8  PROT 7.1  ALBUMIN 3.7   No results for input(s): LIPASE, AMYLASE in the last 72 hours. CBC:  Recent Labs  01/29/15 1130 01/30/15 0300  WBC 12.7* 12.6*  NEUTROABS 10.4*  --   HGB 19.4*  17.1* 16.1  HCT 57.0*  51.0 49.0  MCV 86.9 87.3  PLT 164 158   Cardiac Enzymes:  Recent Labs  01/29/15 1130  01/30/15 1600 01/30/15 2221 01/31/15 0347  CKTOTAL 766*  --   --   --   --   CKMB 8.3*  --   --   --   --   TROPONINI  0.07*  < > 10.01* 10.51* 10.03*  < > = values in this interval not displayed. BNP: Invalid input(s): POCBNP D-Dimer: No results for input(s): DDIMER in the last 72 hours. Hemoglobin A1C:  Recent Labs  01/30/15 0300  HGBA1C 6.1*   Fasting Lipid Panel:  Recent Labs  01/29/15 1131  CHOL 104  HDL 22*  LDLCALC 61  TRIG 413  CHOLHDL 4.7   Thyroid Function Tests: No results for input(s): TSH, T4TOTAL, T3FREE, THYROIDAB in the last 72 hours.  Invalid input(s): FREET3 Anemia Panel: No results for input(s): VITAMINB12, FOLATE, FERRITIN, TIBC, IRON, RETICCTPCT in the last 72 hours.  RADIOLOGY: No results found.  PHYSICAL EXAM General: NAD, obese Neck: Thick, JVP difficult, no thyromegaly or thyroid nodule.  Lungs: Clear to auscultation bilaterally with normal respiratory effort. CV: Nondisplaced PMI.  Heart regular S1/S2, no S3/S4, no murmur.  No peripheral edema.  No carotid bruit.  Normal pedal pulses.  Abdomen: Soft, nontender,  no hepatosplenomegaly, no distention.  Neurologic: Alert and oriented x 3.  Psych: Normal affect. Extremities: No clubbing or cyanosis.   TELEMETRY: Reviewed telemetry pt in NSR with PVCs  ASSESSMENT AND PLAN: 69 yo with extensive history of CAD and ischemic cardiomyopathy presented with inferior STEMI and was actually found to have new occlusion of SVG-D.  1. CAD: Inferior STEMI, but culprit was occlusion of SVG-D.  Now s/p DES to SVG-D.  He also has total occlusion of his native vessels, old TO SVG-RCA and SVG-OM, and patent LIMA-LAD.   - Continue ASA, Brilinta, atorvastatin 80.  2. Ischemic cardiomyopathy: EF 15% on last echo in 2009.  Echo here was poor study, EF "moderately decreased".  Ideally, we'd get a cardiac MRI but he is too large for the magnet.  He does not appear significantly volume overloaded. BP better today.  - I will try to have a limited echo repeated with good positioning and focus on LV function.  - Would use bisoprolol 2.5 mg  daily for beta blockade.  - Can restart lisinopril at 2.5 mg daily and spironolactone at 12.5 daily.  Titrate up if creatinine and BP tolerate well.  - Will likely need to restart home Lasix eventually, hold off today.  - Has not wanted ICD in past.  He is willing to have ICD now.  We need to wait 90 days post-PCI, so think it would be reasonable for him to have Lifevest at discharge.  3. AKI: ?component of contrast nephropathy.  Creatinine trending back down yesterday, not done today.  Will send BMET today.  4. Cardiac rehab.  I think he can go to telemetry.   Marca AnconaDalton Nolan Lasser 01/31/2015 9:41 AM

## 2015-02-01 LAB — CBC
HEMATOCRIT: 44.8 % (ref 39.0–52.0)
Hemoglobin: 14.6 g/dL (ref 13.0–17.0)
MCH: 28.7 pg (ref 26.0–34.0)
MCHC: 32.6 g/dL (ref 30.0–36.0)
MCV: 88.2 fL (ref 78.0–100.0)
PLATELETS: 145 10*3/uL — AB (ref 150–400)
RBC: 5.08 MIL/uL (ref 4.22–5.81)
RDW: 15 % (ref 11.5–15.5)
WBC: 7.8 10*3/uL (ref 4.0–10.5)

## 2015-02-01 LAB — TROPONIN I
Troponin I: 7.99 ng/mL (ref <0.031)
Troponin I: 6.2 ng/mL (ref <0.031)

## 2015-02-01 LAB — BASIC METABOLIC PANEL
Anion gap: 7 (ref 5–15)
BUN: 17 mg/dL (ref 6–23)
CO2: 23 mmol/L (ref 19–32)
Calcium: 8.4 mg/dL (ref 8.4–10.5)
Chloride: 105 mmol/L (ref 96–112)
Creatinine, Ser: 1.45 mg/dL — ABNORMAL HIGH (ref 0.50–1.35)
GFR calc Af Amer: 56 mL/min — ABNORMAL LOW (ref >90)
GFR, EST NON AFRICAN AMERICAN: 48 mL/min — AB (ref >90)
Glucose, Bld: 155 mg/dL — ABNORMAL HIGH (ref 70–99)
Potassium: 3.8 mmol/L (ref 3.5–5.1)
Sodium: 135 mmol/L (ref 135–145)

## 2015-02-01 LAB — GLUCOSE, CAPILLARY
GLUCOSE-CAPILLARY: 135 mg/dL — AB (ref 70–99)
Glucose-Capillary: 114 mg/dL — ABNORMAL HIGH (ref 70–99)

## 2015-02-01 MED ORDER — SPIRONOLACTONE 25 MG PO TABS: 12.5000 mg | ORAL_TABLET | Freq: Every day | ORAL | Status: DC

## 2015-02-01 MED ORDER — TICAGRELOR 90 MG PO TABS: 90.0000 mg | ORAL_TABLET | Freq: Two times a day (BID) | ORAL | Status: DC

## 2015-02-01 MED ORDER — BISOPROLOL FUMARATE 5 MG PO TABS: 2.5000 mg | ORAL_TABLET | Freq: Every day | ORAL | Status: DC

## 2015-02-01 NOTE — Progress Notes (Signed)
CARDIAC REHAB PHASE I   PRE:  Rate/Rhythm: 78 SR  BP:  Supine: 140/82  Sitting:   Standing:    SaO2: 99% RA  MODE:  Ambulation: 400 ft   POST:  Rate/Rhythm: 90  BP:  Supine:   Sitting: 140/82  Standing:    SaO2: 100% RA  [id number] Pt tolerated ambulation well, gait steady, VSS. Encouraged to walk again with nursing staff.  Annetta Mawichards, Emanuell Morina Michele

## 2015-02-01 NOTE — Progress Notes (Signed)
Subjective:  He is feeling better now unable to ambulate without chest pain.  No shortness of breath.  Frequent PVCs noted  Objective:  Vital Signs in the last 24 hours: BP 114/60 mmHg  Pulse 71  Temp(Src) 97.9 F (36.6 C) (Oral)  Resp 20  Ht 6\' 7"  (2.007 m)  Wt 175.225 kg (386 lb 4.8 oz)  BMI 43.50 kg/m2  SpO2 97%  Physical Exam: Extremely large morbidly obese black male polite in no acute distress Lungs:  Clear Cardiac:  Regular rhythm, normal S1 and S2, no S3, frequent irregular beats noted Abdomen:  Soft, nontender, no masses Extremities:  No edema present  Intake/Output from previous day: 02/12 0701 - 02/13 0700 In: 0  Out: 800 [Urine:800]  Weight Filed Weights   01/29/15 1630 01/31/15 1734 02/01/15 0658  Weight: 174 kg (383 lb 9.6 oz) 173.864 kg (383 lb 4.8 oz) 175.225 kg (386 lb 4.8 oz)    Lab Results: Basic Metabolic Panel:  Recent Labs  16/09/9601/12/16 1018 02/01/15 0416  NA 134* 135  K 4.1 3.8  CL 105 105  CO2 22 23  GLUCOSE 138* 155*  BUN 17 17  CREATININE 1.48* 1.45*   CBC:  Recent Labs  01/29/15 1130 01/30/15 0300 02/01/15 0416  WBC 12.7* 12.6* 7.8  NEUTROABS 10.4*  --   --   HGB 19.4*  17.1* 16.1 14.6  HCT 57.0*  51.0 49.0 44.8  MCV 86.9 87.3 88.2  PLT 164 158 145*   Cardiac Panel (last 3 results)  Recent Labs  01/29/15 1130  01/31/15 1605 01/31/15 2134 02/01/15 0416  CKTOTAL 766*  --   --   --   --   CKMB 8.3*  --   --   --   --   TROPONINI 0.07*  < > 7.99* 6.91* 6.20*  RELINDX 1.1  --   --   --   --   < > = values in this interval not displayed.  Telemetry: Sinus rhythm with frequent PVCs  Assessment/Plan:  1.  ST elevation myocardial infarction with PCI to saphenous vein graft to diagonal branch 2.  Ischemic cardiomyopathy-repeat EF shows moderate depression of LV function 3.  Type 2 diabetes 4.  Acute kidney injury improving slowly  Recommendations:  He is being fitted for a LifeVest currently.  I believe if all the  arrangements could be made today that he could be discharged home to resume his home medications with the addition of BRILINTA.     Darden PalmerW. Spencer Shyanne Mcclary, Jr.  MD [facility]  02/01/2015, 10:10 AM

## 2015-02-01 NOTE — Discharge Instructions (Signed)
Wear LifeVest as instructed.   No driving.   Continue all medications as prescribed.

## 2015-02-01 NOTE — Progress Notes (Signed)
[id number] Reviewed exercise guideline and CP, NTG use and calling 911 with pt. Pt verbalizes understanding of instructions given. Artist Paislinty M Ciro Tashiro, MS, ACSM CCEP

## 2015-02-01 NOTE — Discharge Summary (Signed)
Physician Discharge Summary  Patient ID: [name] MRN: [id number] DOB/AGE: [birth year] 69 y.o.   Primary Cardiologist: Dr. Royann Shiversroitoru  Admit date: 01/29/2015 Discharge date: 02/01/2015  Admission Diagnoses: Anterior Wall STEMI  Discharge Diagnoses:  Active Problems:   ST elevation myocardial infarction (STEMI) involving other coronary artery of anterior wall   STEMI (ST elevation myocardial infarction)   Chronic kidney disease   Discharged Condition: stable  Hospital Course: the patient is a 69 y/o male w/ PMHx significant for HTN, DM type II, HLD, CAD s/p CABG in 1995 (LIMA to LAD, SVG to diagonal, re-stented in 2009, other grafts occluded), and ICM w/ EF 15% (ECHO from 2009), admitted 01/29/15 for anterior STEMI. He was taken urgently to the cath lab for emergent revascularization. The  procedure was performed by Dr. Tresa EndoKelly. He was found to have severe native coronary artery disease with total occlusion of all native vessels. Widely patent left internal artery graft supplying the LAD with some collateralization from the distal LAD to the circumflex and diagonal system. Old total occlusion of the vein graft which had supplied the circumflex vessel. Old total occlusion of the vein graft which had supplied the RCA. The acute culprit lesion was discovered to be occlusion of the vein graft which had been previously stented in 2009 which had supplied the diagonal vessel. He underwent very difficult but successful PCI of the vein graft supplying the diagonal vessel with PTCA, thrombectomy, and insertion of a 2.538 mm Promus premier DES stent postdilated to 2.75 mm with the 100% occlusion being reduced to 0% and PTCA of the lesion beyond the stented segment being reduced to 20-30% with restoration of TIMI-3 flow. He tolerated the procedure well and left the cath lab in stable condition. He was placed on DAPT with ASA + Brilinta, as well as hight dose statin therapy with Lipitor, BB therapy with  bisolprolol. ACE therapy with lisinopril was also resumed as well as spironolactone for his LV dysfunction. Repeat limited study 2D echo was obtained revealing "technically difficult study even with Definity. Left ventricular function appears to be moderately depressed especially at the apex". It was recommended that he be protected with a Lifevest for primary prevention of SCD given his severe LV dysfunction. This was arranged prior to discharge. Plans are to continue medical therapy for HF and repeat 2D echo in 3 months. If EF remains <35% despite maximum medical therapy, he will be referred to EP for consideration for an ICD.   On hospital day 3, he was seen and examined by Dr. Donnie Ahoilley. He was w/o recurrent CP. Vital signs were stable. He was on the appropriate medications for CAD and HF and he was fitted with a LifeVest. Dr. Donnie Ahoilley determined he was stable for discharge home. Post hospital w/u will be arranged with Dr. Royann Shiversroitoru or an APP in 1-2 weeks.    Consults: None  Significant Diagnostic Studies:   LHC 01/29/15 HEMODYNAMICS:  Central Aorta: 123/73   ANGIOGRAPHY:  Left main: Large vessel which was completely occluded after the takeoff of a diminutive occluded proximal circumflex.  LAD: Total occlusion at its ostium.  Left circumflex: Totally occluded proximally.  Right coronary artery: Totally occluded near the ostium with faint collateralization in a bridging fashion to the mid RCA.  LIMA to LAD: Large caliber widely patent graft which supplied the mid LAD. There also was collaterals from this distal LAD which seem to supply a portion of the circumflex and possible diagonal distribution.  SVG to circumflex: Occluded  at its origin.  SVG to Diagonal: Occluded in the mid distal portion of the graft prior to the previously placed stent from 2009 insertion.  SVG to RCA: Occluded at its origin.    2D echo limited study 01/31/15 Impressions:  - This is a limited study to look  at LV systolic function. Definity was used. Technically difficult study even with Definity.Left ventricular function appears to be moderately depressed especially at the apex.   Treatments: See Hospital Course  Discharge Exam: Blood pressure 114/60, pulse 71, temperature 97.9 F (36.6 C), temperature source Oral, resp. rate 20, height  (2.007 m), weight 386 lb 4.8 oz (175.225 kg), SpO2 97 %.   Disposition: Home      Discharge Instructions    Diet - low sodium heart healthy    Complete by:  As directed      Increase activity slowly    Complete by:  As directed             Medication List    STOP taking these medications        clopidogrel 75 MG tablet  Commonly known as:  PLAVIX     nebivolol 10 MG tablet  Commonly known as:  BYSTOLIC      TAKE these medications        allopurinol 100 MG tablet  Commonly known as:  ZYLOPRIM  Take 100 mg by mouth daily.     aspirin 81 MG tablet  Take 81 mg by mouth daily.     atorvastatin 80 MG tablet  Commonly known as:  LIPITOR  Take 1 tablet (80 mg total) by mouth daily.     bisoprolol 5 MG tablet  Commonly known as:  ZEBETA  Take 0.5 tablets (2.5 mg total) by mouth daily.     cholecalciferol 1000 UNITS tablet  Commonly known as:  VITAMIN D  Take 1,000 Units by mouth every other day.     Fish Oil 1200 MG Caps  Take by mouth daily.     furosemide 40 MG tablet  Commonly known as:  LASIX  Take 1 tablet (40 mg total) by mouth daily.     insulin aspart 100 UNIT/ML injection  Commonly known as:  novoLOG  Inject 32 Units into the skin daily.     INVOKANA 100 MG Tabs tablet  Generic drug:  canagliflozin  Take 100 mg by mouth daily.     LANTUS 100 UNIT/ML injection  Generic drug:  insulin glargine  Inject 100 Units into the skin daily.     lisinopril 10 MG tablet  Commonly known as:  PRINIVIL,ZESTRIL  Take 1 tablet (10 mg total) by mouth daily.     spironolactone 25 MG tablet  Commonly known as:   ALDACTONE  Take 0.5 tablets (12.5 mg total) by mouth daily.     ticagrelor 90 MG Tabs tablet  Commonly known as:  BRILINTA  Take 1 tablet (90 mg total) by mouth 2 (two) times daily.     ticagrelor 90 MG Tabs tablet  Commonly known as:  BRILINTA  Take 1 tablet (90 mg total) by mouth 2 (two) times daily.     VICTOZA 18 MG/3ML Sopn  Generic drug:  Liraglutide  Inject 1.25 mg into the skin daily.       Follow-up Information    Follow up with Thurmon Fair, MD.   Specialty:  Cardiology   Why:  our office will call you to arrange a follow-up appointment   Contact information:  [address] [place] [address] [phone]       TIME SPENT ON DISCHARGE, INCLUDING PHYSICIAN TIME: > 30 MINUTES  Signed: SIMMONS, BRITTAINY 02/01/2015, 10:46 AM  Patient seen earlier.  Stable for discharge.  Agree with above.  Darden Palmer MD [facility]

## 2015-02-04 ENCOUNTER — Telehealth: Payer: Self-pay | Admitting: Cardiovascular Disease

## 2015-02-04 NOTE — Telephone Encounter (Signed)
More worried about congestion. He is at risk for CHF exacerbation. He should be getting an early transition of care follow up. Is he not scheduled? Soul ideally be seen this week.

## 2015-02-04 NOTE — Telephone Encounter (Signed)
Pt having left shoulder soreness post-hospitalization. He was not clear on why this was, just stated as ongoing.   He states was receiving morphine in hospital for this and was discharged w/o pain medications.  Pt stated congestion & productive cough ongoing x 4-5 days, states it feels like bronchitis. He does have a PCP and I suggested following up w/ them on this.  Will route to Dr. Salena Saner for any additional advice.

## 2015-02-04 NOTE — Telephone Encounter (Signed)
He specifies as upper resp, w/ mucus. States he feels good enough that he isn't necessarily concerned to call PCP for it. No dyspnea associated w it.  I had put him in for f/u 2/29. Can see if we can get sooner appt.

## 2015-02-04 NOTE — Telephone Encounter (Signed)
Mr.Friedl is calling because he having pain in his left shoulder and feeling congestive in his chest . Please call  Thanks

## 2015-02-10 LAB — GLUCOSE, CAPILLARY
GLUCOSE-CAPILLARY: 121 mg/dL — AB (ref 70–99)
Glucose-Capillary: 120 mg/dL — ABNORMAL HIGH (ref 70–99)

## 2015-02-11 ENCOUNTER — Telehealth: Payer: Self-pay | Admitting: Cardiovascular Disease

## 2015-02-11 NOTE — Telephone Encounter (Signed)
Pt wanted to talk to you. He says he has an open sore on the side of his stomach,suppose to be wearing the life vest.

## 2015-02-11 NOTE — Telephone Encounter (Signed)
Pt cites life-vest ill fitting and tight, he is concerned for developing sore on abdomen where strap is chafing skin. Advised protective bandaging and gave wound care instructions. Stressed importance of continuing to wearing life vest. Pt indicated understanding. He has appt w/ Dr. Salena Saner on Thursday, advised options could be discussed w/ physician at this time.

## 2015-02-13 ENCOUNTER — Encounter: Payer: Self-pay | Admitting: Cardiovascular Disease

## 2015-02-13 ENCOUNTER — Ambulatory Visit (INDEPENDENT_AMBULATORY_CARE_PROVIDER_SITE_OTHER): Payer: Medicare Other | Admitting: Cardiovascular Disease

## 2015-02-13 VITALS — BP 92/70 | HR 74 | Ht 79.0 in | Wt 382.7 lb

## 2015-02-13 DIAGNOSIS — I2109 ST elevation (STEMI) myocardial infarction involving other coronary artery of anterior wall: Secondary | ICD-10-CM

## 2015-02-13 DIAGNOSIS — I5022 Chronic systolic (congestive) heart failure: Secondary | ICD-10-CM

## 2015-02-13 DIAGNOSIS — E785 Hyperlipidemia, unspecified: Secondary | ICD-10-CM

## 2015-02-13 DIAGNOSIS — I255 Ischemic cardiomyopathy: Secondary | ICD-10-CM

## 2015-02-13 DIAGNOSIS — I25708 Atherosclerosis of coronary artery bypass graft(s), unspecified, with other forms of angina pectoris: Secondary | ICD-10-CM

## 2015-02-13 DIAGNOSIS — E1122 Type 2 diabetes mellitus with diabetic chronic kidney disease: Secondary | ICD-10-CM

## 2015-02-13 DIAGNOSIS — N183 Chronic kidney disease, stage 3 unspecified: Secondary | ICD-10-CM

## 2015-02-13 MED ORDER — NITROGLYCERIN 0.4 MG SL SUBL: 0.4000 mg | SUBLINGUAL_TABLET | SUBLINGUAL | Status: DC | PRN

## 2015-02-13 NOTE — Patient Instructions (Signed)
NO CHANGE IN MEDICATIONS  Your physician wants you to follow-up in April/MAY 2016 with DR CROITORU. You will receive a reminder letter in the mail two months in advance. If you don't receive a letter, please call our office to schedule the follow-up appointment.

## 2015-02-15 NOTE — Progress Notes (Signed)
Patient ID: [name], male   DOB: 16-Oct-1946, 69 y.o.   MRN: [id number]     Reason for office visit Recent STEMI, severe ischemic cardiomyopathy, CAD s/p CABG (graft dependent), Systolic CHF  [name] had an anterior STEMI on Feb 01, 2015, due to acute occlusion of the previously stented SVG-diagonal artery and had emergency PCI with a 2.5 x 38 mm Promus DES and a downstream POBA of this vessel. The only other patent conduit is the LIMA to LAD (native vessels and SVG to RCA and SVG to LCX chronically occluded). He has at least moderately depressed LVEF. He was dischraged with a life Vest, but the dvice is much too small for him and has caused skin ulcerations. He has sent it back.  LVEF was previously estimated at 15%, but he had declined primary prevention ICD at that time. He has reconsidered and now wants an ICD. We reviewed the fact that we will wait 90 days post MI/PCI before doing that.  He remains morbidly obese with a BMI well in excess of 40. He rarely smokes a cigarette sometimes every 2 weeks or so. His diabetes control is good: his last hemoglobin A1c was 6.1%. His lipid profile was also good. His creatinine clearance is estimated at around 55 mL per minute, not much changed. He has not had recent gout attacks. His systolic blood pressure is usually right around 100 mm Hg or lower, limiting medical therapy for his cardiomyopathy.  Allergies  Allergen Reactions  . Coreg [Carvedilol]     nausea    Current Outpatient Prescriptions  Medication Sig Dispense Refill  . allopurinol (ZYLOPRIM) 100 MG tablet Take 100 mg by mouth daily.    Marland Kitchen aspirin 81 MG tablet Take 81 mg by mouth daily.    Marland Kitchen atorvastatin (LIPITOR) 80 MG tablet Take 1 tablet (80 mg total) by mouth daily. 90 tablet 3  . bisoprolol (ZEBETA) 5 MG tablet Take 0.5 tablets (2.5 mg total) by mouth daily. 30 tablet 5  . cholecalciferol (VITAMIN D) 1000 UNITS tablet Take 1,000 Units by mouth every other day.    . furosemide  (LASIX) 40 MG tablet Take 1 tablet (40 mg total) by mouth daily. 90 tablet 3  . insulin aspart (NOVOLOG) 100 UNIT/ML injection Inject 32 Units into the skin daily.    . INVOKANA 100 MG TABS Take 100 mg by mouth daily.    Marland Kitchen LANTUS 100 UNIT/ML injection Inject 100 Units into the skin daily.     Marland Kitchen lisinopril (PRINIVIL,ZESTRIL) 10 MG tablet Take 1 tablet (10 mg total) by mouth daily. 90 tablet 3  . Omega-3 Fatty Acids (FISH OIL) 1200 MG CAPS Take by mouth daily.    Marland Kitchen spironolactone (ALDACTONE) 25 MG tablet Take 0.5 tablets (12.5 mg total) by mouth daily. 90 tablet 3  . ticagrelor (BRILINTA) 90 MG TABS tablet Take 1 tablet (90 mg total) by mouth 2 (two) times daily. 60 tablet 10  . VICTOZA 18 MG/3ML SOPN Inject 1.25 mg into the skin daily.    . nitroGLYCERIN (NITROSTAT) 0.4 MG SL tablet Place 1 tablet (0.4 mg total) under the tongue every 5 (five) minutes as needed for chest pain. 25 tablet 5   No current facility-administered medications for this visit.    Past Medical History  Diagnosis Date  . Ischemic cardiomyopathy   . CAD (coronary artery disease)   . CHF (congestive heart failure)   . Systemic hypertension   . Chronic kidney disease   . DM (  diabetes mellitus)   . Morbid obesity   . CAD s/p CABG 1995, graft dependent (LIMA to LAD, SVG to diagonal stented, other grafts occluded) 01/25/2014    Angiography 2009 with total occlusion of all 3 proximal native coronary arteries, occlusion of the SVG to RCA and SVG to circumflex, patent LIMA to LAD (collaterals to the distal RCA and OM) and stenotic SVG to first diagonal treated with a drug-eluting Taxus atom stent  . Cardiomyopathy, ischemic 01/25/2014    LVEF 15%  . Chronic systolic CHF (congestive heart failure), NYHA class 2 01/25/2014  . DM type 2 causing CKD stage 3 01/25/2014  . Gout 01/25/2014  . Dyslipidemia - low HDL and high triglycerides 01/25/2014    Past Surgical History  Procedure Laterality Date  . Coronary artery bypass graft  1995    . Coronary angioplasty with stent placement  01/12/2008    multivessel CAD occluded vein grafts to multiple sites including RCA,obtuse marginal branch & CX.  Successful PTCA and stenting distal graft.  . Left heart catheterization with coronary angiogram N/A 01/29/2015    Procedure: LEFT HEART CATHETERIZATION WITH CORONARY ANGIOGRAM;  Surgeon: Lennette Bihari, MD;  Location[facility] CATH LAB;  Service: Cardiovascular;  Laterality: N/A;    Family History  Problem Relation Age of Onset  . Heart failure Mother     History   Social History  . Marital Status: Married    Spouse Name: N/A  . Number of Children: N/A  . Years of Education: N/A   Occupational History  . Not on file.   Social History Main Topics  . Smoking status: Current Every Day Smoker  . Smokeless tobacco: Not on file  . Alcohol Use: Yes     Comment: occas.  . Drug Use: No  . Sexual Activity: Not on file   Other Topics Concern  . Not on file   Social History Narrative    Review of systems: The patient specifically denies any chest pain at rest or with exertion, dyspnea at rest or with exertion, orthopnea, paroxysmal nocturnal dyspnea, syncope, palpitations, focal neurological deficits, intermittent claudication, lower extremity edema, unexplained weight gain, cough, hemoptysis or wheezing.  The patient also denies abdominal pain, nausea, vomiting, dysphagia, diarrhea, constipation, polyuria, polydipsia, dysuria, hematuria, frequency, urgency, abnormal bleeding or bruising, fever, chills, unexpected weight changes, mood swings, change in skin or hair texture, change in voice quality, auditory or visual problems, allergic reactions or rashes, new musculoskeletal complaints other than usual "aches and pains".   PHYSICAL EXAM BP 92/70 mmHg  Pulse 74  Ht  (2.007 m)  Wt 382 lb 11.2 oz (173.592 kg)  BMI 43.10 kg/m2 Morbid obesity limits his examination  General: Alert, oriented x3, no distress Head: no evidence of  trauma, PERRL, EOMI, no exophtalmos or lid lag, no myxedema, no xanthelasma; normal ears, nose and oropharynx Neck: normal jugular venous pulsations and no hepatojugular reflux; brisk carotid pulses without delay and no carotid bruits Chest: clear to auscultation, no signs of consolidation by percussion or palpation, normal fremitus, symmetrical and full respiratory excursions. Large keloid sternotomy scar Cardiovascular: Not locate the apical impulse, regular rhythm, normal first and second heart sounds, no murmurs, rubs or gallops Abdomen: no tenderness or distention, no masses by palpation, no abnormal pulsatility or arterial bruits, normal bowel sounds, no hepatosplenomegaly Extremities: no clubbing, cyanosis or edema; 2+ radial, ulnar and brachial pulses bilaterally; 2+ right femoral, posterior tibial and dorsalis pedis pulses; 2+ left femoral, posterior tibial and dorsalis  pedis pulses; no subclavian or femoral bruits Neurological: grossly nonfocal  EKG: SR with PVCs and PACs; QS in V1-V2, ST depression in I and aVL, II and aVF and V5-V6. QRS 108 ms, QTc 479 ms.  Lipid Panel     Component Value Date/Time   CHOL 104 01/29/2015 1131   TRIG 103 01/29/2015 1131   HDL 22* 01/29/2015 1131   CHOLHDL 4.7 01/29/2015 1131   VLDL 21 01/29/2015 1131   LDLCALC 61 01/29/2015 1131    BMET    Component Value Date/Time   NA 135 02/01/2015 0416   K 3.8 02/01/2015 0416   CL 105 02/01/2015 0416   CO2 23 02/01/2015 0416   GLUCOSE 155* 02/01/2015 0416   BUN 17 02/01/2015 0416   CREATININE 1.45* 02/01/2015 0416   CALCIUM 8.4 02/01/2015 0416   GFRNONAA 48* 02/01/2015 0416   GFRAA 56* 02/01/2015 0416     ASSESSMENT AND PLAN CAD s/p CABG with severe native and graft disease Dependent on LIMA-LAD and newly re-stented SVG-diagonal  Recent anterior STEMI and DES to SVG-diagonal Mandatory dual antiplatelet therapy for 12 months, preferably lifelong  Severe ischemic cardiomyopathy Meets criteria  for Primary prevention ICD implantation for ischemic cardiomyopathy (Prior myocardial infarction, left ventricular ejection fraction under 35%, heart failure NYHA class II-III, on comprehensive medical therapy). Will discuss again in 90 days. Will plan to do on uninterrupted DAPT. Due to difficult airway, will request anesthesia support for sedation for DFT testing  CHF, systolic and diastolic, chronic No symptoms of decompensation. Physical exam almost useless for diagnosis due to super obesity.  DM type 2 and hyperlipidemia Excellent metabolic parameters  STOP smoking.  Orders Placed This Encounter  Procedures  . EKG 12-Lead   Meds ordered this encounter  Medications  . nitroGLYCERIN (NITROSTAT) 0.4 MG SL tablet    Sig: Place 1 tablet (0.4 mg total) under the tongue every 5 (five) minutes as needed for chest pain.    Dispense:  25 tablet    Refill:  5    Emanie Behan  Thurmon FairMihai Ciarah Peace, MD, [facility] HospitalFACC [facility] [phone] office [phone] pager

## 2015-02-17 ENCOUNTER — Ambulatory Visit: Payer: Medicare Other | Admitting: Cardiovascular Disease

## 2015-03-24 ENCOUNTER — Telehealth: Payer: Self-pay | Admitting: Cardiovascular Disease

## 2015-03-24 NOTE — Telephone Encounter (Signed)
[facility] 04[facility]14/15 with Dr Royann Shiversroitoru.  [facility] for Dr Croitoru's schedule on 04/14/15. lp

## 2015-04-14 ENCOUNTER — Encounter: Payer: Self-pay | Admitting: Cardiovascular Disease

## 2015-04-14 ENCOUNTER — Ambulatory Visit (INDEPENDENT_AMBULATORY_CARE_PROVIDER_SITE_OTHER): Payer: Medicare Other | Admitting: Cardiovascular Disease

## 2015-04-14 VITALS — BP 130/62 | HR 60 | Ht 79.0 in | Wt 378.0 lb

## 2015-04-14 DIAGNOSIS — I5022 Chronic systolic (congestive) heart failure: Secondary | ICD-10-CM

## 2015-04-14 DIAGNOSIS — R5383 Other fatigue: Secondary | ICD-10-CM

## 2015-04-14 DIAGNOSIS — I255 Ischemic cardiomyopathy: Secondary | ICD-10-CM | POA: Diagnosis not present

## 2015-04-14 DIAGNOSIS — Z7901 Long term (current) use of anticoagulants: Secondary | ICD-10-CM | POA: Diagnosis not present

## 2015-04-14 DIAGNOSIS — I25708 Atherosclerosis of coronary artery bypass graft(s), unspecified, with other forms of angina pectoris: Secondary | ICD-10-CM

## 2015-04-14 DIAGNOSIS — Z79899 Other long term (current) drug therapy: Secondary | ICD-10-CM | POA: Diagnosis not present

## 2015-04-14 NOTE — Patient Instructions (Signed)
Your physician has recommended that you have a [employer] CARDIAC DEFIBRILLATOR inserted.  ANESTHESIOLOGY WILL  BE CALLED IN TO ASSIST.   An implantable cardioverter defibrillator (ICD) is a small device that is placed in your chest or, in rare cases, your abdomen. This device uses electrical pulses or shocks to help control life-threatening, irregular heartbeats that could lead the heart to suddenly stop beating (sudden cardiac arrest). Leads are attached to the ICD that goes into your heart. This is done in the hospital and usually requires an overnight stay. Please see the instruction sheet given to you today for more information.  Your physician recommends that you return for lab work in: 3-7 DAYS PRIOR TO THE PROCEDURE AT [facility].

## 2015-04-14 NOTE — Progress Notes (Signed)
Patient ID: [name], male   DOB: Jul 02, 1946, 69 y.o.   MRN: [id number]     Cardiology Office Note   Date:  04/15/2015   ID:  [name], DOB Jul 02, 1946, MRN [id number]  PCP:  [name], [name]  Cardiologist:   [name],[name], [name]   Chief Complaint  Patient presents with  . Follow-up    2 months:  No complaints of chest pain, SOB, edema or dizziness. Med list showed Spironolactone 25mg  1/2 tab daily but he has always taken a whole tab daily.       History of Present Illness: [name] is a 69 y.o. male who presents for Recent STEMI, severe ischemic cardiomyopathy, CAD s/p CABG (graft dependent), Systolic CHF  [name] is here in anticipation of ICD implantation, almost 90 days s/p STEMI and PCI-stent.  [name] had an anterior STEMI on Feb 01, 2015, due to acute occlusion of the previously stented SVG-diagonal artery and had emergency PCI with a 2.5 x 38 mm Promus DES and a downstream POBA of this vessel. The only other patent conduit is the LIMA to LAD (native vessels, SVG to RCA and SVG to LCX are all chronically occluded). He has at least moderately depressed LVEF.   LVEF was previously estimated at 15%, but he had declined primary prevention ICD at that time. He has reconsidered and now wants an ICD. We reviewed the fact that we will wait 90 days post MI/PCI before doing that. A follow up echo was nondiagnostic due to poor image quality, even with Definity microbubble contrast.  He remains morbidly obese with a BMI well in excess of 40.  His diabetes control is good: his last hemoglobin A1c was 6.1%. His lipid profile was also good. His creatinine clearance is estimated at around 55 mL per minute, not much changed. He has not had recent gout attacks. His systolic blood pressure is usually right around 100 mm Hg or lower, limiting medical therapy for his cardiomyopathy.  He probably has obstructive sleep apnea, although he declines use of CPAP and undergoing sleep  study. He has a very crowded oropharynx and a nasal voice.    Past Medical History  Diagnosis Date  . Ischemic cardiomyopathy   . CAD (coronary artery disease)   . CHF (congestive heart failure)   . Systemic hypertension   . Chronic kidney disease   . DM (diabetes mellitus)   . Morbid obesity   . CAD s/p CABG 1995, graft dependent (LIMA to LAD, SVG to diagonal stented, other grafts occluded) 01/25/2014    Angiography 2009 with total occlusion of all 3 proximal native coronary arteries, occlusion of the SVG to RCA and SVG to circumflex, patent LIMA to LAD (collaterals to the distal RCA and OM) and stenotic SVG to first diagonal treated with a drug-eluting Taxus atom stent  . Cardiomyopathy, ischemic 01/25/2014    LVEF 15%  . Chronic systolic CHF (congestive heart failure), NYHA class 2 01/25/2014  . DM type 2 causing CKD stage 3 01/25/2014  . Gout 01/25/2014  . Dyslipidemia - low HDL and high triglycerides 01/25/2014    Past Surgical History  Procedure Laterality Date  . Coronary artery bypass graft  1995  . Coronary angioplasty with stent placement  01/12/2008    multivessel CAD occluded vein grafts to multiple sites including RCA,obtuse marginal branch & CX.  Successful PTCA and stenting distal graft.  . Left heart catheterization with coronary angiogram N/A 01/29/2015    Procedure: LEFT HEART CATHETERIZATION  WITH CORONARY ANGIOGRAM;  Surgeon: Lennette Biharihomas A Kelly, [name];  [facility];  Service: Cardiovascular;  Laterality: N/A;     Current Outpatient Prescriptions  Medication Sig Dispense Refill  . allopurinol (ZYLOPRIM) 100 MG tablet Take 100 mg by mouth daily.    Marland Kitchen. aspirin 81 MG tablet Take 81 mg by mouth daily.    Marland Kitchen. atorvastatin (LIPITOR) 80 MG tablet Take 1 tablet (80 mg total) by mouth daily. 90 tablet 3  . bisoprolol (ZEBETA) 5 MG tablet Take 0.5 tablets (2.5 mg total) by mouth daily. 30 tablet 5  . cholecalciferol (VITAMIN D) 1000 UNITS tablet Take 1,000 Units by mouth every other  day.    . furosemide (LASIX) 40 MG tablet Take 1 tablet (40 mg total) by mouth daily. 90 tablet 3  . insulin aspart (NOVOLOG) 100 UNIT/ML injection Inject 32 Units into the skin daily.    . INVOKANA 100 MG TABS Take 100 mg by mouth daily.    Marland Kitchen. LANTUS 100 UNIT/ML injection Inject 100 Units into the skin daily.     Marland Kitchen. lisinopril (PRINIVIL,ZESTRIL) 10 MG tablet Take 1 tablet (10 mg total) by mouth daily. 90 tablet 3  . nitroGLYCERIN (NITROSTAT) 0.4 MG SL tablet Place 1 tablet (0.4 mg total) under the tongue every 5 (five) minutes as needed for chest pain. 25 tablet 5  . Omega-3 Fatty Acids (FISH OIL) 1200 MG CAPS Take by mouth daily.    Marland Kitchen. spironolactone (ALDACTONE) 25 MG tablet Take 0.5 tablets (12.5 mg total) by mouth daily. (Patient taking differently: Take 25 mg by mouth daily. ) 90 tablet 3  . ticagrelor (BRILINTA) 90 MG TABS tablet Take 1 tablet (90 mg total) by mouth 2 (two) times daily. 60 tablet 10  . VICTOZA 18 MG/3ML SOPN Inject 1.25 mg into the skin daily.     No current facility-administered medications for this visit.    Allergies:   Coreg    Social History:  The patient  reports that he has been smoking.  He does not have any smokeless tobacco history on file. He reports that he drinks alcohol. He reports that he does not use illicit drugs.   Family History:  The patient's family history includes Heart failure in his mother.    ROS:  Please see the history of present illness.    Otherwise, review of systems positive for none.   All other systems are reviewed and negative.    PHYSICAL EXAM: VS:  BP 130/62 mmHg  Pulse 60  Ht 6\' 7"  (2.007 m)  Wt 378 lb (171.46 kg)  BMI 42.57 kg/m2 , BMI Body mass index is 42.57 kg/(m^2). Morbid obesity limits his examination  General: Alert, oriented x3, no distress Head: no evidence of trauma, PERRL, EOMI, no exophtalmos or lid lag, no myxedema, no xanthelasma; normal ears, nose and oropharynx Neck: normal jugular venous pulsations and  no hepatojugular reflux; brisk carotid pulses without delay and no carotid bruits Chest: clear to auscultation, no signs of consolidation by percussion or palpation, normal fremitus, symmetrical and full respiratory excursions. Large keloid sternotomy scar Cardiovascular: Not locate the apical impulse, regular rhythm, normal first and second heart sounds, no murmurs, rubs or gallops Abdomen: no tenderness or distention, no masses by palpation, no abnormal pulsatility or arterial bruits, normal bowel sounds, no hepatosplenomegaly Extremities: no clubbing, cyanosis or edema; 2+ radial, ulnar and brachial pulses bilaterally; 2+ right femoral, posterior tibial and dorsalis pedis pulses; 2+ left femoral, posterior tibial and dorsalis pedis pulses;  no subclavian or femoral bruits Neurological: grossly nonfocalPsych: euthymic mood, full affect   EKG:  EKG is not ordered today.    Recent Labs: 01/29/2015: ALT 21 02/01/2015: BUN 17; Creatinine 1.45*; Hemoglobin 14.6; Platelets 145*; Potassium 3.8; Sodium 135    Lipid Panel    Component Value Date/Time   CHOL 104 01/29/2015 1131   TRIG 103 01/29/2015 1131   HDL 22* 01/29/2015 1131   CHOLHDL 4.7 01/29/2015 1131   VLDL 21 01/29/2015 1131   LDLCALC 61 01/29/2015 1131      Wt Readings from Last 3 Encounters:  04/14/15 378 lb (171.46 kg)  02/13/15 382 lb 11.2 oz (173.592 kg)  02/01/15 386 lb 4.8 oz (175.225 kg)       ASSESSMENT AND PLAN:  Ardis meets MADIT criteria for AICD: Primary prevention ICD implantation for ischemic cardiomyopathy (Prior myocardial infarction, left ventricular ejection fraction under 35%, heart failure NYHA class II-III, on comprehensive medical therapy).  We have repeatedly reviewed the pros and the cons of AICD versus medical management.  His body habitus places at substantially higher risk of procedural complications, especially respiratory problems due to worsening upper airway obstruction with sedation. Will  ask for anesthesiology assistance with sedation for the implantation, especially with DFT testing.  Also has higher risk of mechanical complications such as lead dislodgment and pneumothorax and infection due to morbid obesity and diabetes.  CHF appears well compensated from a clinical standpoint. Plan uninterrupted dual antiplatelet therapy at the time of the procedure.    Current medicines are reviewed at length with the patient today.  The patient does not have concerns regarding medicines.  The following changes have been made:  no change  Labs/ tests ordered today include:  Orders Placed This Encounter  Procedures  . APTT  . Protime-INR  . CBC  . Comprehensive metabolic panel  . IMPLANTABLE CARDIOVERTER DEFIBRILLATOR IMPLANT   Patient Instructions  Your physician has recommended that you have a [employer] CARDIAC DEFIBRILLATOR inserted.  ANESTHESIOLOGY WILL  BE CALLED IN TO ASSIST.   An implantable cardioverter defibrillator (ICD) is a small device that is placed in your chest or, in rare cases, your abdomen. This device uses electrical pulses or shocks to help control life-threatening, irregular heartbeats that could lead the heart to suddenly stop beating (sudden cardiac arrest). Leads are attached to the ICD that goes into your heart. This is done in the hospital and usually requires an overnight stay. Please see the instruction sheet given to you today for more information.  Your physician recommends that you return for lab work in: 3-7 DAYS PRIOR TO THE PROCEDURE AT [facility].       Matthew Bimler, [name]  04/15/2015 9:15 PM    Matthew Fair, [name], [facility] [phone] office [phone] pager

## 2015-04-22 ENCOUNTER — Other Ambulatory Visit: Payer: Self-pay | Admitting: *Deleted

## 2015-04-22 DIAGNOSIS — I255 Ischemic cardiomyopathy: Secondary | ICD-10-CM

## 2015-04-25 NOTE — Progress Notes (Signed)
Anesthesia Note:  Patient is a 69 year old male scheduled for AICD implantation on 05/09/15 by Dr. Royann Shiversroitoru. His note states, "His body habitus places at substantially higher risk of procedural complications, especially respiratory problems due to worsening upper airway obstruction with sedation. Will ask for anesthesiology assistance with sedation for the implantation, especially with DFT testing."  He is to be evaluated by anesthesiologist Dr. Noreene LarssonJoslin during his PAT visit.   History includes smoking, CAD s/p CABG '95 (known occlusion of RCA and LCX grafts) s/p stent SVG-DIAG '09, STEMI 02/01/15 due to acute occlusion of the previously stented SVG-DIAG artery s/p Promus DES and downstream angioplasty, ischemic cardiomyopathy, chronic systolic CHF, DM2, CKD stage III, HTN, dyslipidemia, morbid obesity. Cardiology notes indicate that he has suspected OSA, but declined sleep study or CPAP.  PCP is listed as Dr. Devra Doppamieka Howell.  Meds includes allopurinol, ASA, Lipitor, bisoprolol, Lasix, Novolog, Invokana, Lantus, lisinopril, Nitro, fish oil, spironolactone, Brilinta, Victoza. "Plan uninterrupted dual antiplatelet therapy at the time of the procedure."  02/13/15 EKG: SR with frequent PVCs and possible PACs with aberrant conduction, low voltage QRS, septal infarct (age undetermined), ST/T wave abnormality, consider inferolateral ischemia.  01/31/15 Echo (limited study):  - This is a limited study to look at LV systolic function. Definity was used. Technically difficult study even with Definity.Left ventricular function appears to be moderately depressedespecially at the apex. (EF was 10-20% on 01/17/08. Patient declined )  01/29/15 Cardiac cath: - Left main: Large vessel which was completely occluded after the takeoff of a diminutive occluded proximal circumflex. - LAD: Total occlusion at its ostium. - Left circumflex: Totally occluded proximally. - Right coronary artery: Totally occluded near the ostium  with faint collateralization in a bridging fashion to the mid RCA. - LIMA to LAD: Large caliber widely patent graft which supplied the mid LAD. There also was collaterals from this distal LAD which seem to supply a portion of the circumflex and possible diagonal distribution. - SVG to circumflex: Occluded at its origin. - SVG to Diagonal: Occluded in the mid distal portion of the graft prior to the previously placed stent from 2009 insertion. - SVG to RCA: Occluded at its origin. - Following difficult percutaneous cardiac intervention, the saphenous vein graft supplying the diagonal vessel, which was 100% occluded and had TIMI 0 flow with significant thrombus burden was opened. The 2.538 mm Promus premier DES stent region was opened to 0%. There was brisk TIMI-3 flow. The small segment beyond the stented region which had undergone PTCA was reduced to approximately 20-30%. The diagonal vessel was small caliber and had multiple small branches beyond its anastomosis. IMPRESSION:  - Acute coronary syndrome with ST segment elevation inferiorly in this patient with previously documented severe ischemic cardiopathy and an ejection fraction of 15%. - Severe native coronary artery disease with total occlusion of all native vessels. - Widely patent left internal artery graft supplying the LAD with some collateralization from the distal LAD to the circumflex and diagonal system. - Old total occlusion of the vein graft which had supplied the circumflex vessel. - Old total occlusion of the vein graft which had supplied the RCA. - Acute culprit lesion occlusion of the vein graft which had been previously stented in 2009 which had supplied the diagonal vessel. - Very difficult but successful PCI of the vein graft supplying the diagonal vessel with PTCA, thrombectomy, and insertion of a 2.538 mm Promus premier DES stent postdilated to 2.75 mm with the 100% occlusion being reduced to 0%  and PTCA of the lesion  beyond the stented segment being reduced to 20-30% with restoration of TIMI-3 flow. RECOMMENDATION: The patient will require aggressive medical management for severe coronary obstructive disease including ACE inhibition, carvedilol, aggressive statin therapy, and nitrates. Ranolazine should be considered added to his medical regimen in light of significant native as well as graft occlusion. Additional discussion concerning prophylactic ICD implantation is necessary in light of his high risk for arrhythmia mediated sudden cardiac death. His Plavix was changed to Brilinta for more aggressive antiplatelet therapy. He should continue lifelong dual antiplatelet therapy.  Further anesthesiology input pending his PAT visit on 04/28/15.  Velna Ochsllison Zelenak, PA-C [facility]Anesthesiology Phone [phone] 04/25/2015 5:06 PM

## 2015-04-28 ENCOUNTER — Encounter (HOSPITAL_COMMUNITY): Payer: Self-pay

## 2015-04-28 ENCOUNTER — Encounter (HOSPITAL_COMMUNITY)
Admission: RE | Admit: 2015-04-28 | Discharge: 2015-04-28 | Disposition: A | Discharge status: Home / Self Care | Payer: Medicare Other | Source: Ambulatory Visit | Attending: Cardiovascular Disease | Admitting: Cardiovascular Disease

## 2015-04-28 DIAGNOSIS — Z951 Presence of aortocoronary bypass graft: Secondary | ICD-10-CM | POA: Diagnosis not present

## 2015-04-28 DIAGNOSIS — F17200 Nicotine dependence, unspecified, uncomplicated: Secondary | ICD-10-CM | POA: Diagnosis not present

## 2015-04-28 DIAGNOSIS — Z955 Presence of coronary angioplasty implant and graft: Secondary | ICD-10-CM | POA: Diagnosis not present

## 2015-04-28 DIAGNOSIS — E785 Hyperlipidemia, unspecified: Secondary | ICD-10-CM | POA: Diagnosis not present

## 2015-04-28 DIAGNOSIS — I2510 Atherosclerotic heart disease of native coronary artery without angina pectoris: Secondary | ICD-10-CM | POA: Diagnosis not present

## 2015-04-28 DIAGNOSIS — I255 Ischemic cardiomyopathy: Secondary | ICD-10-CM | POA: Diagnosis not present

## 2015-04-28 DIAGNOSIS — Z6841 Body Mass Index (BMI) 40.0 and over, adult: Secondary | ICD-10-CM | POA: Diagnosis not present

## 2015-04-28 DIAGNOSIS — Z7982 Long term (current) use of aspirin: Secondary | ICD-10-CM | POA: Diagnosis not present

## 2015-04-28 DIAGNOSIS — I252 Old myocardial infarction: Secondary | ICD-10-CM | POA: Diagnosis not present

## 2015-04-28 DIAGNOSIS — E119 Type 2 diabetes mellitus without complications: Secondary | ICD-10-CM | POA: Diagnosis not present

## 2015-04-28 DIAGNOSIS — Z794 Long term (current) use of insulin: Secondary | ICD-10-CM | POA: Diagnosis not present

## 2015-04-28 DIAGNOSIS — I5022 Chronic systolic (congestive) heart failure: Secondary | ICD-10-CM | POA: Diagnosis not present

## 2015-04-28 DIAGNOSIS — I129 Hypertensive chronic kidney disease with stage 1 through stage 4 chronic kidney disease, or unspecified chronic kidney disease: Secondary | ICD-10-CM | POA: Diagnosis not present

## 2015-04-28 DIAGNOSIS — Z79899 Other long term (current) drug therapy: Secondary | ICD-10-CM | POA: Diagnosis not present

## 2015-04-28 DIAGNOSIS — N189 Chronic kidney disease, unspecified: Secondary | ICD-10-CM | POA: Diagnosis not present

## 2015-04-28 HISTORY — DX: Other specified postprocedural states: R11.2

## 2015-04-28 HISTORY — DX: Nausea with vomiting, unspecified: Z98.890

## 2015-04-28 HISTORY — DX: Acute myocardial infarction, unspecified: I21.9

## 2015-04-28 LAB — SURGICAL PCR SCREEN
MRSA, PCR: NEGATIVE
Staphylococcus aureus: NEGATIVE

## 2015-04-28 NOTE — Pre-Procedure Instructions (Signed)
Ezequiel GanserGeorge V Whitesel  04/28/2015   Your procedure is scheduled on:  Friday, May 09, 2015  [facility] at 9:00 AM.  Call this number if you have problems the morning of surgery: [phone]   Remember:    Do not eat food or drink liquids after midnight Thursday, May 08, 2015   Take these medicines the morning of surgery with A SIP OF WATER:  allopurinol (ZYLOPRIM), bisoprolol (ZEBETA), BYSTOLIC, aspirin, ticagrelor (BRILINTA) if needed: nitroGLYCERIN (NITROSTAT) for chest pain  DO NOT take any diabetic medications the morning of procedure such as insulin or INVOKANA.  Stop taking over the counter vitamins and herbal medications such as Omega-3 Fatty Acids (FISH OIL).  Do not take any NSAIDs ie: Ibuprofen, Advil, Naproxen or etc. ; stop 1 week prior to procedure (Friday, May 02, 2015).  Do not wear jewelry, make-up or nail polish.  Do not wear lotions, powders, or perfumes. You may not wear deodorant.  Do not shave 48 hours prior to surgery. Men may shave face and neck.  Do not bring valuables to the hospital.  [facility] is not responsible for any belongings or valuables.               Contacts, dentures or bridgework may not be worn into surgery.  Leave suitcase in the car. After surgery it may be brought to your room.  For patients admitted to the hospital, discharge time is determined by your treatment team.               Patients discharged the day of surgery will not be allowed to drive home.  Name and phone number of your driver:   Special Instructions:  Special Instructions:Special Instructions: [facility] - Pre[facility]  Before surgery, you can play an important role.  Because skin is not sterile, your skin needs to be as free of germs as possible.  You can reduce the number of germs on you skin by washing with CHG (chlorahexidine gluconate) soap before surgery.  CHG is an antiseptic cleaner which kills germs and bonds with the skin to  continue killing germs even after washing.  Please DO NOT use if you have an allergy to CHG or antibacterial soaps.  If your skin becomes reddened/irritated stop using the CHG and inform your nurse when you arrive at Short Stay.  Do not shave (including legs and underarms) for at least 48 hours prior to the first CHG shower.  You may shave your face.  Please follow these instructions carefully:   1.  Shower with CHG Soap the night before surgery and the morning of Surgery.  2.  If you choose to wash your hair, wash your hair first as usual with your normal shampoo.  3.  After you shampoo, rinse your hair and body thoroughly to remove the Shampoo.  4.  Use CHG as you would any other liquid soap.  You can apply chg directly  to the skin and wash gently with scrungie or a clean washcloth.  5.  Apply the CHG Soap to your body ONLY FROM THE NECK DOWN.  Do not use on open wounds or open sores.  Avoid contact with your eyes, ears, mouth and genitals (private parts).  Wash genitals (private parts) with your normal soap.  6.  Wash thoroughly, paying special attention to the area where your surgery will be performed.  7.  Thoroughly rinse your body with warm water from the neck down.  8.  DO NOT shower/wash with your normal soap after using and rinsing off the CHG Soap.  9.  Pat yourself dry with a clean towel.            10.  Wear clean pajamas.            11.  Place clean sheets on your bed the night of your first shower and do not sleep with pets.  Day of Surgery  Do not apply any lotions/deodorants the morning of surgery.  Please wear clean clothes to the hospital/surgery center.   Please read over the following fact sheets that you were given: Pain Booklet, Coughing and Deep Breathing, MRSA Information and Surgical Site Infection Prevention

## 2015-04-28 NOTE — Progress Notes (Signed)
Pt denies SOB and chest pain but is under the care of Dr. Royann Shiversroitoru, cardiology. Spoke with Harrold DonathNathan, RN at Dr. Erin Hearingroitoru's office to clarify lab instructions. According to Harrold DonathNathan, RN pt is not to have labs done at PAT, pt [facility] prior to procedure for labs ( CBC, CMP, PT, PTT). Harrold Donathathan also stated that pt can take Aspirin and Brilinta on morning of procedure. Dr. Noreene LarssonJoslin consulted with pt during PAT. Pt requested to have EKG done on DOS ( see MD order).

## 2015-04-28 NOTE — Progress Notes (Signed)
   04/28/15 1145  OBSTRUCTIVE SLEEP APNEA  Have you ever been diagnosed with sleep apnea through a sleep study? No  Do you snore loudly (loud enough to be heard through closed doors)?  0  Do you often feel tired, fatigued, or sleepy during the daytime? 0  Has anyone observed you stop breathing during your sleep? 0  Do you have, or are you being treated for high blood pressure? 1  BMI more than 35 kg/m2? 1  Age over 67 years old? 1  Neck circumference greater than 40 cm/16 inches? 1  Gender: 1  Obstructive Sleep Apnea Score 5

## 2015-04-29 ENCOUNTER — Other Ambulatory Visit: Payer: Self-pay | Admitting: Cardiovascular Disease

## 2015-04-29 NOTE — Telephone Encounter (Signed)
Rx(s) sent to pharmacy electronically.  

## 2015-05-01 ENCOUNTER — Telehealth: Payer: Self-pay | Admitting: Cardiovascular Disease

## 2015-05-01 NOTE — Telephone Encounter (Signed)
Pt date for his defibrillator to be put in have been changed. She wants to know when should he have his lab work now?

## 2015-05-01 NOTE — Telephone Encounter (Signed)
Pt.s wife informed that labs need to be done 7 days  Before the procedure, pt.s wife stated understanding of instructions

## 2015-05-02 LAB — CBC
HCT: 47.9 % (ref 39.0–52.0)
Hemoglobin: 16.5 g/dL (ref 13.0–17.0)
MCH: 28.9 pg (ref 26.0–34.0)
MCHC: 34.4 g/dL (ref 30.0–36.0)
MCV: 83.9 fL (ref 78.0–100.0)
MPV: 9.4 fL (ref 8.6–12.4)
Platelets: 186 10*3/uL (ref 150–400)
RBC: 5.71 MIL/uL (ref 4.22–5.81)
RDW: 15.6 % — AB (ref 11.5–15.5)
WBC: 11.8 10*3/uL — ABNORMAL HIGH (ref 4.0–10.5)

## 2015-05-03 LAB — APTT: aPTT: 34 seconds (ref 24–37)

## 2015-05-03 LAB — COMPREHENSIVE METABOLIC PANEL
ALK PHOS: 56 U/L (ref 39–117)
ALT: 16 U/L (ref 0–53)
AST: 23 U/L (ref 0–37)
Albumin: 3.7 g/dL (ref 3.5–5.2)
BUN: 16 mg/dL (ref 6–23)
CHLORIDE: 105 meq/L (ref 96–112)
CO2: 22 mEq/L (ref 19–32)
Calcium: 9.2 mg/dL (ref 8.4–10.5)
Creat: 1.59 mg/dL — ABNORMAL HIGH (ref 0.50–1.35)
GLUCOSE: 96 mg/dL (ref 70–99)
Potassium: 4.2 mEq/L (ref 3.5–5.3)
Sodium: 138 mEq/L (ref 135–145)
TOTAL PROTEIN: 7 g/dL (ref 6.0–8.3)
Total Bilirubin: 0.5 mg/dL (ref 0.2–1.2)

## 2015-05-03 LAB — PROTIME-INR
INR: 1 (ref <1.50)
Prothrombin Time: 13.2 seconds (ref 11.6–15.2)

## 2015-05-08 MED ORDER — DEXTROSE 5 % IV SOLN
3.0000 g | INTRAVENOUS | Status: AC
  Administered 2015-05-09: 3 g via INTRAVENOUS
  Filled 2015-05-08: qty 3000

## 2015-05-08 MED ORDER — SODIUM CHLORIDE 0.9 % IR SOLN
80.0000 mg | Status: DC
  Filled 2015-05-08: qty 2

## 2015-05-09 ENCOUNTER — Ambulatory Visit (HOSPITAL_COMMUNITY): Payer: Medicare Other | Admitting: Vascular Surgery

## 2015-05-09 ENCOUNTER — Encounter (HOSPITAL_COMMUNITY): Payer: Self-pay | Admitting: *Deleted

## 2015-05-09 ENCOUNTER — Encounter (HOSPITAL_COMMUNITY): Payer: Self-pay

## 2015-05-09 ENCOUNTER — Ambulatory Visit (HOSPITAL_COMMUNITY)
Admission: RE | Admit: 2015-05-09 | Discharge: 2015-05-10 | Disposition: A | Discharge status: Home / Self Care | Payer: Medicare Other | Source: Ambulatory Visit | Attending: Cardiovascular Disease | Admitting: Cardiovascular Disease

## 2015-05-09 ENCOUNTER — Ambulatory Visit (HOSPITAL_COMMUNITY): Admit: 2015-05-09 | Payer: Self-pay | Admitting: Cardiovascular Disease

## 2015-05-09 ENCOUNTER — Encounter (HOSPITAL_COMMUNITY)
Admission: RE | Disposition: A | Discharge status: Home / Self Care | Payer: Medicare Other | Source: Ambulatory Visit | Attending: Cardiovascular Disease

## 2015-05-09 ENCOUNTER — Ambulatory Visit (HOSPITAL_COMMUNITY): Payer: Medicare Other | Admitting: Certified Registered"

## 2015-05-09 DIAGNOSIS — E785 Hyperlipidemia, unspecified: Secondary | ICD-10-CM | POA: Diagnosis present

## 2015-05-09 DIAGNOSIS — I2510 Atherosclerotic heart disease of native coronary artery without angina pectoris: Secondary | ICD-10-CM | POA: Diagnosis not present

## 2015-05-09 DIAGNOSIS — Z79899 Other long term (current) drug therapy: Secondary | ICD-10-CM | POA: Insufficient documentation

## 2015-05-09 DIAGNOSIS — I252 Old myocardial infarction: Secondary | ICD-10-CM | POA: Insufficient documentation

## 2015-05-09 DIAGNOSIS — Z9189 Other specified personal risk factors, not elsewhere classified: Secondary | ICD-10-CM

## 2015-05-09 DIAGNOSIS — Z6841 Body Mass Index (BMI) 40.0 and over, adult: Secondary | ICD-10-CM | POA: Insufficient documentation

## 2015-05-09 DIAGNOSIS — I255 Ischemic cardiomyopathy: Secondary | ICD-10-CM | POA: Diagnosis not present

## 2015-05-09 DIAGNOSIS — Z7982 Long term (current) use of aspirin: Secondary | ICD-10-CM | POA: Insufficient documentation

## 2015-05-09 DIAGNOSIS — N183 Chronic kidney disease, stage 3 unspecified: Secondary | ICD-10-CM | POA: Diagnosis present

## 2015-05-09 DIAGNOSIS — Z951 Presence of aortocoronary bypass graft: Secondary | ICD-10-CM | POA: Diagnosis not present

## 2015-05-09 DIAGNOSIS — E119 Type 2 diabetes mellitus without complications: Secondary | ICD-10-CM | POA: Insufficient documentation

## 2015-05-09 DIAGNOSIS — Z955 Presence of coronary angioplasty implant and graft: Secondary | ICD-10-CM | POA: Insufficient documentation

## 2015-05-09 DIAGNOSIS — I129 Hypertensive chronic kidney disease with stage 1 through stage 4 chronic kidney disease, or unspecified chronic kidney disease: Secondary | ICD-10-CM | POA: Insufficient documentation

## 2015-05-09 DIAGNOSIS — Z794 Long term (current) use of insulin: Secondary | ICD-10-CM | POA: Insufficient documentation

## 2015-05-09 DIAGNOSIS — N189 Chronic kidney disease, unspecified: Secondary | ICD-10-CM | POA: Insufficient documentation

## 2015-05-09 DIAGNOSIS — E1122 Type 2 diabetes mellitus with diabetic chronic kidney disease: Secondary | ICD-10-CM | POA: Diagnosis present

## 2015-05-09 DIAGNOSIS — F17200 Nicotine dependence, unspecified, uncomplicated: Secondary | ICD-10-CM | POA: Insufficient documentation

## 2015-05-09 DIAGNOSIS — I5022 Chronic systolic (congestive) heart failure: Secondary | ICD-10-CM | POA: Insufficient documentation

## 2015-05-09 DIAGNOSIS — Z95810 Presence of automatic (implantable) cardiac defibrillator: Secondary | ICD-10-CM | POA: Diagnosis present

## 2015-05-09 HISTORY — PX: EP IMPLANTABLE DEVICE: SHX172B

## 2015-05-09 LAB — GLUCOSE, CAPILLARY
GLUCOSE-CAPILLARY: 77 mg/dL (ref 65–99)
Glucose-Capillary: 103 mg/dL — ABNORMAL HIGH (ref 65–99)

## 2015-05-09 SURGERY — IMPLANTABLE CARDIOVERTER DEFIBRILLATOR IMPLANT
Anesthesia: Monitor Anesthesia Care

## 2015-05-09 SURGERY — ICD IMPLANT
Anesthesia: Monitor Anesthesia Care

## 2015-05-09 MED ORDER — SODIUM CHLORIDE 0.9 % IV SOLN
INTRAVENOUS | Status: DC
  Administered 2015-05-10: via INTRAVENOUS

## 2015-05-09 MED ORDER — PHENYLEPHRINE HCL 10 MG/ML IJ SOLN
10.0000 mg | INTRAMUSCULAR | Status: DC | PRN
  Administered 2015-05-09: 20 ug/min via INTRAVENOUS

## 2015-05-09 MED ORDER — ONDANSETRON HCL 4 MG/2ML IJ SOLN
INTRAMUSCULAR | Status: DC | PRN
  Administered 2015-05-09: 4 mg via INTRAVENOUS

## 2015-05-09 MED ORDER — HYDROMORPHONE HCL 1 MG/ML IJ SOLN: 0.2500 mg | INTRAMUSCULAR | Status: DC | PRN

## 2015-05-09 MED ORDER — ONDANSETRON HCL 4 MG/2ML IJ SOLN: 4.0000 mg | Freq: Four times a day (QID) | INTRAMUSCULAR | Status: DC | PRN

## 2015-05-09 MED ORDER — FENTANYL CITRATE (PF) 100 MCG/2ML IJ SOLN
INTRAMUSCULAR | Status: DC | PRN
  Administered 2015-05-09 (×2): 50 ug via INTRAVENOUS
  Administered 2015-05-09: 100 ug via INTRAVENOUS

## 2015-05-09 MED ORDER — ACETAMINOPHEN 325 MG PO TABS: 325.0000 mg | ORAL_TABLET | ORAL | Status: DC | PRN

## 2015-05-09 MED ORDER — INSULIN GLARGINE 100 UNIT/ML ~~LOC~~ SOLN
100.0000 [IU] | Freq: Every day | SUBCUTANEOUS | Status: DC
  Administered 2015-05-09 – 2015-05-10 (×2): 100 [IU] via SUBCUTANEOUS
  Filled 2015-05-09 (×2): qty 1

## 2015-05-09 MED ORDER — ATORVASTATIN CALCIUM 80 MG PO TABS
80.0000 mg | ORAL_TABLET | Freq: Every day | ORAL | Status: DC
  Administered 2015-05-10: 80 mg via ORAL
  Filled 2015-05-09 (×2): qty 1

## 2015-05-09 MED ORDER — SPIRONOLACTONE 25 MG PO TABS
25.0000 mg | ORAL_TABLET | Freq: Every day | ORAL | Status: DC
  Administered 2015-05-10: 25 mg via ORAL
  Filled 2015-05-09 (×2): qty 1

## 2015-05-09 MED ORDER — CHLORHEXIDINE GLUCONATE 4 % EX LIQD: 60.0000 mL | Freq: Once | CUTANEOUS | Status: DC

## 2015-05-09 MED ORDER — PROMETHAZINE HCL 25 MG/ML IJ SOLN: 6.2500 mg | INTRAMUSCULAR | Status: DC | PRN

## 2015-05-09 MED ORDER — MUPIROCIN 2 % EX OINT
TOPICAL_OINTMENT | Freq: Once | CUTANEOUS | Status: DC
Start: 2015-05-09 — End: 2015-05-10

## 2015-05-09 MED ORDER — ROCURONIUM BROMIDE 100 MG/10ML IV SOLN
INTRAVENOUS | Status: DC | PRN
  Administered 2015-05-09: 40 mg via INTRAVENOUS

## 2015-05-09 MED ORDER — TICAGRELOR 90 MG PO TABS
90.0000 mg | ORAL_TABLET | Freq: Two times a day (BID) | ORAL | Status: DC
  Administered 2015-05-09 – 2015-05-10 (×2): 90 mg via ORAL
  Filled 2015-05-09 (×3): qty 1

## 2015-05-09 MED ORDER — HYDROMORPHONE HCL 1 MG/ML IJ SOLN: 0.5000 mg | INTRAMUSCULAR | Status: DC | PRN

## 2015-05-09 MED ORDER — NITROGLYCERIN 0.4 MG SL SUBL: 0.4000 mg | SUBLINGUAL_TABLET | SUBLINGUAL | Status: DC | PRN

## 2015-05-09 MED ORDER — FUROSEMIDE 40 MG PO TABS
40.0000 mg | ORAL_TABLET | Freq: Every day | ORAL | Status: DC
  Administered 2015-05-09 – 2015-05-10 (×2): 40 mg via ORAL
  Filled 2015-05-09 (×3): qty 1

## 2015-05-09 MED ORDER — CEFAZOLIN SODIUM 1-5 GM-% IV SOLN
1.0000 g | Freq: Four times a day (QID) | INTRAVENOUS | Status: AC
  Administered 2015-05-09 – 2015-05-10 (×3): 1 g via INTRAVENOUS
  Filled 2015-05-09 (×3): qty 50

## 2015-05-09 MED ORDER — EPHEDRINE SULFATE 50 MG/ML IJ SOLN
INTRAMUSCULAR | Status: DC | PRN
  Administered 2015-05-09: 5 mg via INTRAVENOUS

## 2015-05-09 MED ORDER — LIDOCAINE HCL (CARDIAC) 20 MG/ML IV SOLN
INTRAVENOUS | Status: DC | PRN
  Administered 2015-05-09: 100 mg via INTRAVENOUS

## 2015-05-09 MED ORDER — IOHEXOL 350 MG/ML SOLN
INTRAVENOUS | Status: DC | PRN
  Administered 2015-05-09: 10 mL via INTRAVENOUS

## 2015-05-09 MED ORDER — HYDROMORPHONE HCL 1 MG/ML IJ SOLN
INTRAMUSCULAR | Status: AC
  Administered 2015-05-09 (×2): 0.5 mg
  Filled 2015-05-09: qty 1

## 2015-05-09 MED ORDER — ALBUMIN HUMAN 5 % IV SOLN
INTRAVENOUS | Status: AC
  Administered 2015-05-09: 12.5 g
  Filled 2015-05-09: qty 250

## 2015-05-09 MED ORDER — VITAMIN D3 25 MCG (1000 UNIT) PO TABS
1000.0000 [IU] | ORAL_TABLET | ORAL | Status: DC
  Filled 2015-05-09: qty 1

## 2015-05-09 MED ORDER — PROPOFOL 10 MG/ML IV BOLUS
INTRAVENOUS | Status: DC | PRN
  Administered 2015-05-09 (×3): 100 mg via INTRAVENOUS

## 2015-05-09 MED ORDER — HYDROCODONE-ACETAMINOPHEN 5-325 MG PO TABS
1.0000 | ORAL_TABLET | ORAL | Status: DC | PRN
  Administered 2015-05-09 – 2015-05-10 (×4): 2 via ORAL
  Filled 2015-05-09 (×4): qty 2

## 2015-05-09 MED ORDER — SODIUM CHLORIDE 0.9 % IV SOLN
INTRAVENOUS | Status: DC | PRN
  Administered 2015-05-09 (×2): via INTRAVENOUS

## 2015-05-09 MED ORDER — ALBUMIN HUMAN 5 % IV SOLN: 12.5000 g | Freq: Once | INTRAVENOUS | Status: DC

## 2015-05-09 MED ORDER — ETOMIDATE 2 MG/ML IV SOLN
INTRAVENOUS | Status: DC | PRN
  Administered 2015-05-09: 20 mg via INTRAVENOUS

## 2015-05-09 MED ORDER — LACTATED RINGERS IV SOLN: INTRAVENOUS | Status: DC | PRN

## 2015-05-09 MED ORDER — ALLOPURINOL 100 MG PO TABS
100.0000 mg | ORAL_TABLET | Freq: Every day | ORAL | Status: DC
  Administered 2015-05-10: 100 mg via ORAL
  Filled 2015-05-09 (×2): qty 1

## 2015-05-09 MED ORDER — HYDROMORPHONE HCL 1 MG/ML IJ SOLN
INTRAMUSCULAR | Status: AC
  Administered 2015-05-09: 0.5 mg
  Filled 2015-05-09: qty 1

## 2015-05-09 MED ORDER — SODIUM CHLORIDE 0.9 % IJ SOLN: 3.0000 mL | INTRAMUSCULAR | Status: DC | PRN

## 2015-05-09 MED ORDER — MUPIROCIN 2 % EX OINT
TOPICAL_OINTMENT | CUTANEOUS | Status: AC
  Filled 2015-05-09: qty 22

## 2015-05-09 MED ORDER — NEOSTIGMINE METHYLSULFATE 10 MG/10ML IV SOLN
INTRAVENOUS | Status: DC | PRN
  Administered 2015-05-09: .8 mg via INTRAVENOUS

## 2015-05-09 MED ORDER — SUCCINYLCHOLINE CHLORIDE 20 MG/ML IJ SOLN
INTRAMUSCULAR | Status: DC | PRN
  Administered 2015-05-09: 200 mg via INTRAVENOUS

## 2015-05-09 MED ORDER — NEBIVOLOL HCL 10 MG PO TABS
10.0000 mg | ORAL_TABLET | Freq: Every day | ORAL | Status: DC
  Administered 2015-05-10: 10 mg via ORAL
  Filled 2015-05-09 (×2): qty 1

## 2015-05-09 MED ORDER — ASPIRIN 81 MG PO CHEW
81.0000 mg | CHEWABLE_TABLET | Freq: Every day | ORAL | Status: DC
  Administered 2015-05-10: 81 mg via ORAL
  Filled 2015-05-09 (×2): qty 1

## 2015-05-09 MED ORDER — SODIUM CHLORIDE 0.9 % IV SOLN
INTRAVENOUS | Status: DC
  Administered 2015-05-09: 09:00:00 via INTRAVENOUS

## 2015-05-09 MED ORDER — LISINOPRIL 10 MG PO TABS
10.0000 mg | ORAL_TABLET | Freq: Every day | ORAL | Status: DC
  Administered 2015-05-10: 10 mg via ORAL
  Filled 2015-05-09 (×2): qty 1

## 2015-05-09 MED ORDER — INSULIN ASPART 100 UNIT/ML ~~LOC~~ SOLN
32.0000 [IU] | Freq: Every day | SUBCUTANEOUS | Status: DC
  Administered 2015-05-10: 32 [IU] via SUBCUTANEOUS

## 2015-05-09 SURGICAL SUPPLY — 11 items
CABLE SURGICAL S-101-97-12 (CABLE) ×2 IMPLANT
ELECT DEFIB PAD ADLT CADENCE (PAD) IMPLANT
HOVERMATT SINGLE USE (MISCELLANEOUS) ×1 IMPLANT
ICD EVERA XT DR DDBB1D4 (ICD Generator) ×1 IMPLANT
KIT ESSENTIALS PG (KITS) IMPLANT
LEAD CAPSURE NOVUS 5076-58CM (Lead) ×1 IMPLANT
LEAD SPRINT QUATTRO 6947M-72 (Lead) ×1 IMPLANT
PAD DEFIB LIFELINK (PAD) ×2 IMPLANT
SHEATH CLASSIC 7F (SHEATH) ×1 IMPLANT
SHEATH CLASSIC 9F (SHEATH) ×1 IMPLANT
TRAY PACEMAKER INSERTION (CUSTOM PROCEDURE TRAY) ×2 IMPLANT

## 2015-05-09 NOTE — Progress Notes (Signed)
Echos from February reviewed. Estimated LVEF is 15-20% with apical dyskinesis. Technically difficult studies, with Definity contrast.

## 2015-05-09 NOTE — Progress Notes (Signed)
Pt was swabbed for MRSA on 04/28/2015  The results were negative.  We are not going to re swab him today

## 2015-05-09 NOTE — Transfer of Care (Signed)
Immediate Anesthesia Transfer of Care Note  Patient: Ezequiel GanserGeorge V Hayse  Procedure(s) Performed: Procedure(s): Icd Implant (N/A)  Patient Location: PACU  Anesthesia Type:General  Level of Consciousness: awake, alert  and oriented  Airway & Oxygen Therapy: Patient Spontanous Breathing and Patient connected to face mask oxygen  Post-op Assessment: Report given to RN, Post -op Vital signs reviewed and stable and Patient moving all extremities X 4  Post vital signs: Reviewed and stable  Last Vitals:  Filed Vitals:   05/09/15 0856  BP: 160/78  Pulse: 38  Temp: 36.6 C  Resp: 20    Complications: No apparent anesthesia complications

## 2015-05-09 NOTE — Anesthesia Procedure Notes (Signed)
Procedure Name: Intubation Date/Time: 05/09/2015 1:54 PM Performed by: Lanell MatarBAKER, Merit Maybee M Pre-anesthesia Checklist: Patient identified, Emergency Drugs available, Suction available and Patient being monitored Patient Re-evaluated:Patient Re-evaluated prior to inductionOxygen Delivery Method: Circle system utilized Preoxygenation: Pre-oxygenation with 100% oxygen Intubation Type: IV induction and Rapid sequence Ventilation: Mask ventilation without difficulty, Two handed mask ventilation required and Oral airway inserted - appropriate to patient size Laryngoscope Size: Glidescope and 4 Grade View: Grade I Tube type: Oral Tube size: 8.0 mm Number of attempts: 1 Airway Equipment and Method: Stylet,  Video-laryngoscopy and Oral airway Placement Confirmation: ETT inserted through vocal cords under direct vision,  positive ETCO2 and breath sounds checked- equal and bilateral Secured at: 24 cm Tube secured with: Tape Dental Injury: Teeth and Oropharynx as per pre-operative assessment

## 2015-05-09 NOTE — Progress Notes (Signed)
ICD Criteria  Current LVEF:15% ;Obtained > 6 months ago.   NYHA Functional Classification: Class II  Heart Failure History:  Yes, Duration of heart failure since onset is > 9 months  Non-Ischemic Dilated Cardiomyopathy History:  No.  Atrial Fibrillation/Atrial Flutter:  No.  Ventricular Tachycardia History:  No.  Cardiac Arrest History:  No  History of Syndromes with Risk of Sudden Death:  No.  Previous ICD:  No.  Electrophysiology Study: No.  Prior MI: Yes, Most recent MI timeframe is > 40 days.  PPM: No.  OSA:  Yes  Patient Life Expectancy of >=1 year: Yes.  Anticoagulation Therapy:  Patient is NOT on anticoagulation therapy.   Beta Blocker Therapy:  Yes.   Ace Inhibitor/ARB Therapy:  Yes.

## 2015-05-09 NOTE — H&P (View-Only) (Signed)
Patient ID: [name], male   DOB: 07/23/1946, 69 y.o.   MRN: [id number]     Cardiology Office Note   Date:  04/15/2015   ID:  [name], DOB 07/31/1946, MRN [id number]  PCP:  HOWELL, TAMIEKA, MD  Cardiologist:   Keno Caraway, MD   Chief Complaint  Patient presents with  . Follow-up    2 months:  No complaints of chest pain, SOB, edema or dizziness. Med list showed Spironolactone 25mg 1/2 tab daily but he has always taken a whole tab daily.       History of Present Illness: [name] is a 69 y.o. male who presents for Recent STEMI, severe ischemic cardiomyopathy, CAD s/p CABG (graft dependent), Systolic CHF  [name] is here in anticipation of ICD implantation, almost 90 days s/p STEMI and PCI-stent.  [name] had an anterior STEMI on Feb 01, 2015, due to acute occlusion of the previously stented SVG-diagonal artery and had emergency PCI with a 2.5 x 38 mm Promus DES and a downstream POBA of this vessel. The only other patent conduit is the LIMA to LAD (native vessels, SVG to RCA and SVG to LCX are all chronically occluded). He has at least moderately depressed LVEF.   LVEF was previously estimated at 15%, but he had declined primary prevention ICD at that time. He has reconsidered and now wants an ICD. We reviewed the fact that we will wait 90 days post MI/PCI before doing that. A follow up echo was nondiagnostic due to poor image quality, even with Definity microbubble contrast.  He remains morbidly obese with a BMI well in excess of 40.  His diabetes control is good: his last hemoglobin A1c was 6.1%. His lipid profile was also good. His creatinine clearance is estimated at around 55 mL per minute, not much changed. He has not had recent gout attacks. His systolic blood pressure is usually right around 100 mm Hg or lower, limiting medical therapy for his cardiomyopathy.  He probably has obstructive sleep apnea, although he declines use of CPAP and undergoing sleep  study. He has a very crowded oropharynx and a nasal voice.    Past Medical History  Diagnosis Date  . Ischemic cardiomyopathy   . CAD (coronary artery disease)   . CHF (congestive heart failure)   . Systemic hypertension   . Chronic kidney disease   . DM (diabetes mellitus)   . Morbid obesity   . CAD s/p CABG 1995, graft dependent (LIMA to LAD, SVG to diagonal stented, other grafts occluded) 01/25/2014    Angiography 2009 with total occlusion of all 3 proximal native coronary arteries, occlusion of the SVG to RCA and SVG to circumflex, patent LIMA to LAD (collaterals to the distal RCA and OM) and stenotic SVG to first diagonal treated with a drug-eluting Taxus atom stent  . Cardiomyopathy, ischemic 01/25/2014    LVEF 15%  . Chronic systolic CHF (congestive heart failure), NYHA class 2 01/25/2014  . DM type 2 causing CKD stage 3 01/25/2014  . Gout 01/25/2014  . Dyslipidemia - low HDL and high triglycerides 01/25/2014    Past Surgical History  Procedure Laterality Date  . Coronary artery bypass graft  1995  . Coronary angioplasty with stent placement  01/12/2008    multivessel CAD occluded vein grafts to multiple sites including RCA,obtuse marginal branch & CX.  Successful PTCA and stenting distal graft.  . Left heart catheterization with coronary angiogram N/A 01/29/2015    Procedure: LEFT HEART CATHETERIZATION   WITH CORONARY ANGIOGRAM;  Surgeon: Thomas A Kelly, MD;  Location: [facility];  Service: Cardiovascular;  Laterality: N/A;     Current Outpatient Prescriptions  Medication Sig Dispense Refill  . allopurinol (ZYLOPRIM) 100 MG tablet Take 100 mg by mouth daily.    . aspirin 81 MG tablet Take 81 mg by mouth daily.    . atorvastatin (LIPITOR) 80 MG tablet Take 1 tablet (80 mg total) by mouth daily. 90 tablet 3  . bisoprolol (ZEBETA) 5 MG tablet Take 0.5 tablets (2.5 mg total) by mouth daily. 30 tablet 5  . cholecalciferol (VITAMIN D) 1000 UNITS tablet Take 1,000 Units by mouth every other  day.    . furosemide (LASIX) 40 MG tablet Take 1 tablet (40 mg total) by mouth daily. 90 tablet 3  . insulin aspart (NOVOLOG) 100 UNIT/ML injection Inject 32 Units into the skin daily.    . INVOKANA 100 MG TABS Take 100 mg by mouth daily.    . LANTUS 100 UNIT/ML injection Inject 100 Units into the skin daily.     . lisinopril (PRINIVIL,ZESTRIL) 10 MG tablet Take 1 tablet (10 mg total) by mouth daily. 90 tablet 3  . nitroGLYCERIN (NITROSTAT) 0.4 MG SL tablet Place 1 tablet (0.4 mg total) under the tongue every 5 (five) minutes as needed for chest pain. 25 tablet 5  . Omega-3 Fatty Acids (FISH OIL) 1200 MG CAPS Take by mouth daily.    . spironolactone (ALDACTONE) 25 MG tablet Take 0.5 tablets (12.5 mg total) by mouth daily. (Patient taking differently: Take 25 mg by mouth daily. ) 90 tablet 3  . ticagrelor (BRILINTA) 90 MG TABS tablet Take 1 tablet (90 mg total) by mouth 2 (two) times daily. 60 tablet 10  . VICTOZA 18 MG/3ML SOPN Inject 1.25 mg into the skin daily.     No current facility-administered medications for this visit.    Allergies:   Coreg    Social History:  The patient  reports that he has been smoking.  He does not have any smokeless tobacco history on file. He reports that he drinks alcohol. He reports that he does not use illicit drugs.   Family History:  The patient's family history includes Heart failure in his mother.    ROS:  Please see the history of present illness.    Otherwise, review of systems positive for none.   All other systems are reviewed and negative.    PHYSICAL EXAM: VS:  BP 130/62 mmHg  Pulse 60  Ht 6' 7" (2.007 m)  Wt 378 lb (171.46 kg)  BMI 42.57 kg/m2 , BMI Body mass index is 42.57 kg/(m^2). Morbid obesity limits his examination  General: Alert, oriented x3, no distress Head: no evidence of trauma, PERRL, EOMI, no exophtalmos or lid lag, no myxedema, no xanthelasma; normal ears, nose and oropharynx Neck: normal jugular venous pulsations and  no hepatojugular reflux; brisk carotid pulses without delay and no carotid bruits Chest: clear to auscultation, no signs of consolidation by percussion or palpation, normal fremitus, symmetrical and full respiratory excursions. Large keloid sternotomy scar Cardiovascular: Not locate the apical impulse, regular rhythm, normal first and second heart sounds, no murmurs, rubs or gallops Abdomen: no tenderness or distention, no masses by palpation, no abnormal pulsatility or arterial bruits, normal bowel sounds, no hepatosplenomegaly Extremities: no clubbing, cyanosis or edema; 2+ radial, ulnar and brachial pulses bilaterally; 2+ right femoral, posterior tibial and dorsalis pedis pulses; 2+ left femoral, posterior tibial and dorsalis pedis pulses;   no subclavian or femoral bruits Neurological: grossly nonfocalPsych: euthymic mood, full affect   EKG:  EKG is not ordered today.    Recent Labs: 01/29/2015: ALT 21 02/01/2015: BUN 17; Creatinine 1.45*; Hemoglobin 14.6; Platelets 145*; Potassium 3.8; Sodium 135    Lipid Panel    Component Value Date/Time   CHOL 104 01/29/2015 1131   TRIG 103 01/29/2015 1131   HDL 22* 01/29/2015 1131   CHOLHDL 4.7 01/29/2015 1131   VLDL 21 01/29/2015 1131   LDLCALC 61 01/29/2015 1131      Wt Readings from Last 3 Encounters:  04/14/15 378 lb (171.46 kg)  02/13/15 382 lb 11.2 oz (173.592 kg)  02/01/15 386 lb 4.8 oz (175.225 kg)       ASSESSMENT AND PLAN:  [name] meets MADIT criteria for AICD: Primary prevention ICD implantation for ischemic cardiomyopathy (Prior myocardial infarction, left ventricular ejection fraction under 35%, heart failure NYHA class II-III, on comprehensive medical therapy).  We have repeatedly reviewed the pros and the cons of AICD versus medical management.  His body habitus places at substantially higher risk of procedural complications, especially respiratory problems due to worsening upper airway obstruction with sedation. Will  ask for anesthesiology assistance with sedation for the implantation, especially with DFT testing.  Also has higher risk of mechanical complications such as lead dislodgment and pneumothorax and infection due to morbid obesity and diabetes.  CHF appears well compensated from a clinical standpoint. Plan uninterrupted dual antiplatelet therapy at the time of the procedure.    Current medicines are reviewed at length with the patient today.  The patient does not have concerns regarding medicines.  The following changes have been made:  no change  Labs/ tests ordered today include:  Orders Placed This Encounter  Procedures  . APTT  . Protime-INR  . CBC  . Comprehensive metabolic panel  . IMPLANTABLE CARDIOVERTER DEFIBRILLATOR IMPLANT   Patient Instructions  Your physician has recommended that you have a [employer] CARDIAC DEFIBRILLATOR inserted.  ANESTHESIOLOGY WILL  BE CALLED IN TO ASSIST.   An implantable cardioverter defibrillator (ICD) is a small device that is placed in your chest or, in rare cases, your abdomen. This device uses electrical pulses or shocks to help control life-threatening, irregular heartbeats that could lead the heart to suddenly stop beating (sudden cardiac arrest). Leads are attached to the ICD that goes into your heart. This is done in the hospital and usually requires an overnight stay. Please see the instruction sheet given to you today for more information.  Your physician recommends that you return for lab work in: 3-7 DAYS PRIOR TO THE PROCEDURE AT [facility].       Signed, Charisa Twitty, MD  04/15/2015 9:15 PM    Matthew Bazen, MD, FACC [facility] [phone] office [phone] pager   

## 2015-05-09 NOTE — Interval H&P Note (Signed)
History and Physical Interval Note:  05/09/2015 11:19 AM  Ezequiel GanserGeorge V Dauria  has presented today for surgery, with the diagnosis of ischemic cardiomyopathy  The various methods of treatment have been discussed with the patient and family. After consideration of risks, benefits and other options for treatment, the patient has consented to  Procedure(s): Icd Implant (N/A) as a surgical intervention .  The patient's history has been reviewed, patient examined, no change in status, stable for surgery.  I have reviewed the patient's chart and labs.  Questions were answered to the patient's satisfaction.     Hollynn Garno

## 2015-05-09 NOTE — Anesthesia Preprocedure Evaluation (Addendum)
Anesthesia Evaluation  Patient identified by MRN, date of birth, ID band Patient awake    Reviewed: Allergy & Precautions, NPO status , Patient's Chart, lab work & pertinent test results, reviewed documented beta blocker date and time   History of Anesthesia Complications (+) PONV and history of anesthetic complications  Airway Mallampati: II  TM Distance: >3 FB Neck ROM: Full    Dental no notable dental hx.    Pulmonary neg pulmonary ROS, Current Smoker,  breath sounds clear to auscultation  Pulmonary exam normal       Cardiovascular hypertension, Pt. on medications and Pt. on home beta blockers + CAD, + Past MI and +CHF Normal cardiovascular examRhythm:Regular Rate:Normal  Echo 01/2015 - This is a limited study to look at LV systolic function. Definitywas used. Technically difficult study even with Definity. Leftventricular function appears to be moderately depressedespecially at the apex.   Neuro/Psych negative neurological ROS  negative psych ROS   GI/Hepatic negative GI ROS, Neg liver ROS,   Endo/Other  diabetes, Type 2  Renal/GU CRFRenal disease     Musculoskeletal negative musculoskeletal ROS (+)   Abdominal   Peds  Hematology negative hematology ROS (+)   Anesthesia Other Findings   Reproductive/Obstetrics negative OB ROS                           Anesthesia Physical Anesthesia Plan  ASA: IV  Anesthesia Plan: General   Post-op Pain Management:    Induction: Intravenous  Airway Management Planned: Oral ETT  Additional Equipment: Arterial line  Intra-op Plan:   Post-operative Plan: Extubation in OR  Informed Consent: I have reviewed the patients History and Physical, chart, labs and discussed the procedure including the risks, benefits and alternatives for the proposed anesthesia with the patient or authorized representative who has indicated his/her understanding and  acceptance.   Dental advisory given  Plan Discussed with: CRNA  Anesthesia Plan Comments:       Anesthesia Quick Evaluation

## 2015-05-10 ENCOUNTER — Ambulatory Visit (HOSPITAL_COMMUNITY): Payer: Medicare Other

## 2015-05-10 DIAGNOSIS — I2510 Atherosclerotic heart disease of native coronary artery without angina pectoris: Secondary | ICD-10-CM | POA: Diagnosis not present

## 2015-05-10 DIAGNOSIS — Z951 Presence of aortocoronary bypass graft: Secondary | ICD-10-CM | POA: Diagnosis not present

## 2015-05-10 DIAGNOSIS — I255 Ischemic cardiomyopathy: Secondary | ICD-10-CM | POA: Diagnosis not present

## 2015-05-10 DIAGNOSIS — I252 Old myocardial infarction: Secondary | ICD-10-CM | POA: Diagnosis not present

## 2015-05-10 LAB — GLUCOSE, CAPILLARY
GLUCOSE-CAPILLARY: 138 mg/dL — AB (ref 65–99)
Glucose-Capillary: 85 mg/dL (ref 65–99)
Glucose-Capillary: 109 mg/dL — ABNORMAL HIGH (ref 65–99)
Glucose-Capillary: 165 mg/dL — ABNORMAL HIGH (ref 65–99)

## 2015-05-10 MED ORDER — HYDROCODONE-ACETAMINOPHEN 5-325 MG PO TABS: 1.0000 | ORAL_TABLET | ORAL | Status: DC | PRN

## 2015-05-10 MED ORDER — ACETAMINOPHEN 325 MG PO TABS: 325.0000 mg | ORAL_TABLET | ORAL | Status: AC | PRN

## 2015-05-10 NOTE — Progress Notes (Signed)
Md informed about new stain to the dressing of the  aicd implant site. No new orders given will continue to monitor

## 2015-05-10 NOTE — Discharge Instructions (Signed)

## 2015-05-10 NOTE — Discharge Summary (Signed)
Patient ID: [name],  MRN: [id number], DOB/AGE: [birth year] 69 y.o.  Admit date: 05/09/2015 Discharge date: 05/10/2015  Primary Care Provider: Devra Dopp, MD Primary Cardiologist: Dr Royann Shivers  Discharge Diagnoses Principal Problem:   At risk for sudden cardiac death Active Problems:   AICD (automatic cardioverter/defibrillator) present   CAD s/p CABG 1995, graft dependent (LIMA to LAD, SVG to diagonal stented, other grafts occluded)   Cardiomyopathy, ischemic   DM type 2 causing CKD stage 3   Morbid obesity   Dyslipidemia - low HDL and high triglycerides    Procedures: ICD implant 05/09/15   Hospital Course:  69 y.o. male with a history of an anterior STEMI on Feb 01, 2015, due to acute occlusion of the previously stented SVG-diagonal artery and had emergency PCI with a 2.5 x 38 mm Promus DES and a downstream POBA of this vessel. The only other patent conduit is the LIMA to LAD (native vessels, SVG to RCA and SVG to LCX are all chronically occluded). LVEF was previously estimated at 15%, but he had declined primary prevention ICD at that time. He has reconsidered and now wants an ICD. He was seen by Dr Royann Shivers as an OP and it was decided to proceed with elective ICD implant. This was done 5/201/6 without complication. See OP note below.  1. Implantation of new MDT dual chamber cardioverter defibrillator 2. Fluoroscopy 3. Moderate sedation 4. Initial lead and generator testing 5. Left upper extremity venogram  Dr Royann Shivers felt he could be discharged 05/10/15 if he was stable overnight. He has had no complaints except soreness at the site. He did ooze during the night but this am his site line is intact and not oozing.     Discharge Vitals:  Blood pressure 123/62, pulse 63, temperature 98 F (36.7 C), temperature source Oral, resp. rate 18, height  (2.007 m), weight 385 lb 12.9 oz (175 kg), SpO2 98 %.    Labs: Results for orders placed or performed  during the hospital encounter of 05/09/15 (from the past 24 hour(s))  Glucose, capillary     Status: None   Collection Time: 05/09/15  4:34 PM  Result Value Ref Range   Glucose-Capillary 77 65 - 99 mg/dL  Glucose, capillary     Status: None   Collection Time: 05/09/15  8:58 PM  Result Value Ref Range   Glucose-Capillary 85 65 - 99 mg/dL   Comment 1 Capillary Specimen     Disposition:  Follow-up Information    Follow up with CROITORU,MIHAI, MD.   Specialty:  Cardiology   Why:  offcie will contact you   Contact information:   [address] [place] [address] [phone]       Discharge Medications:    Medication List    STOP taking these medications        bisoprolol 5 MG tablet  Commonly known as:  ZEBETA      TAKE these medications        acetaminophen 325 MG tablet  Commonly known as:  TYLENOL  Take 1-2 tablets (325-650 mg total) by mouth every 4 (four) hours as needed for mild pain.     allopurinol 100 MG tablet  Commonly known as:  ZYLOPRIM  Take 100 mg by mouth daily.     aspirin 81 MG tablet  Take 81 mg by mouth daily.     atorvastatin 80 MG tablet  Commonly known as:  LIPITOR  Take 1 tablet (  80 mg total) by mouth daily.     BYSTOLIC 10 MG tablet  Generic drug:  nebivolol  Take 10 mg by mouth daily.     cholecalciferol 1000 UNITS tablet  Commonly known as:  VITAMIN D  Take 1,000 Units by mouth every other day.     Fish Oil 1200 MG Caps  Take 1,200 mg by mouth daily.     furosemide 40 MG tablet  Commonly known as:  LASIX  Take 1 tablet (40 mg total) by mouth daily.     HYDROcodone-acetaminophen 5-325 MG per tablet  Commonly known as:  NORCO/VICODIN  Take 1-2 tablets by mouth every 4 (four) hours as needed for moderate pain.     insulin aspart 100 UNIT/ML injection  Commonly known as:  novoLOG  Inject 32 Units into the skin daily.     INVOKANA 100 MG Tabs tablet  Generic drug:  canagliflozin  Take 100 mg by mouth daily.      LANTUS 100 UNIT/ML injection  Generic drug:  insulin glargine  Inject 100 Units into the skin daily.     lisinopril 10 MG tablet  Commonly known as:  PRINIVIL,ZESTRIL  Take 1 tablet (10 mg total) by mouth daily.     nitroGLYCERIN 0.4 MG SL tablet  Commonly known as:  NITROSTAT  Place 1 tablet (0.4 mg total) under the tongue every 5 (five) minutes as needed for chest pain.     spironolactone 25 MG tablet  Commonly known as:  ALDACTONE  Take 0.5 tablets (12.5 mg total) by mouth daily.     ticagrelor 90 MG Tabs tablet  Commonly known as:  BRILINTA  Take 1 tablet (90 mg total) by mouth 2 (two) times daily.     VICTOZA 18 MG/3ML Sopn  Generic drug:  Liraglutide  Inject 1.25 mg into the skin daily.         Duration of Discharge Encounter: Greater than 30 minutes including physician time.  Jolene ProvostSigned, Kanani Mowbray PA-C 05/10/2015 11:25 AM

## 2015-05-12 NOTE — Anesthesia Postprocedure Evaluation (Signed)
Anesthesia Post Note  Patient: Matthew GanserGeorge V Gallegos  Procedure(s) Performed: Procedure(s) (LRB): Icd Implant (N/A)  Anesthesia type: general  Patient location: PACU  Post pain: Pain level controlled  Post assessment: Patient's Cardiovascular Status Stable   Post vital signs: Reviewed and stable  Level of consciousness: sedated  Complications: No apparent anesthesia complications

## 2015-05-13 ENCOUNTER — Encounter (HOSPITAL_COMMUNITY): Payer: Self-pay | Admitting: Cardiovascular Disease

## 2015-05-23 ENCOUNTER — Ambulatory Visit (INDEPENDENT_AMBULATORY_CARE_PROVIDER_SITE_OTHER): Payer: Medicare Other | Admitting: *Deleted

## 2015-05-23 DIAGNOSIS — I255 Ischemic cardiomyopathy: Secondary | ICD-10-CM

## 2015-05-23 DIAGNOSIS — I5022 Chronic systolic (congestive) heart failure: Secondary | ICD-10-CM | POA: Diagnosis not present

## 2015-05-23 LAB — CUP PACEART INCLINIC DEVICE CHECK
Battery Remaining Longevity: 99 mo
Battery Voltage: 3.09 V
Brady Statistic AP VS Percent: 68.86 %
Brady Statistic AS VP Percent: 0.05 %
Brady Statistic AS VS Percent: 31.01 %
Brady Statistic RA Percent Paced: 68.94 %
Brady Statistic RV Percent Paced: 0.14 %
Date Time Interrogation Session: 20160603143303
HighPow Impedance: 190 Ohm
HighPow Impedance: 46 Ohm
HighPow Impedance: 59 Ohm
Lead Channel Impedance Value: 456 Ohm
Lead Channel Pacing Threshold Pulse Width: 0.4 ms
Lead Channel Sensing Intrinsic Amplitude: 21.375 mV
Lead Channel Sensing Intrinsic Amplitude: 27.125 mV
Lead Channel Sensing Intrinsic Amplitude: 8.75 mV
Lead Channel Setting Pacing Amplitude: 3.5 V
Lead Channel Setting Sensing Sensitivity: 0.3 mV
MDC IDC MSMT LEADCHNL RA IMPEDANCE VALUE: 475 Ohm
MDC IDC MSMT LEADCHNL RA PACING THRESHOLD AMPLITUDE: 0.75 V
MDC IDC MSMT LEADCHNL RA SENSING INTR AMPL: 7.75 mV
MDC IDC MSMT LEADCHNL RV PACING THRESHOLD AMPLITUDE: 0.875 V
MDC IDC MSMT LEADCHNL RV PACING THRESHOLD PULSEWIDTH: 0.4 ms
MDC IDC SET LEADCHNL RA PACING AMPLITUDE: 3.5 V
MDC IDC SET LEADCHNL RV PACING PULSEWIDTH: 0.4 ms
MDC IDC SET ZONE DETECTION INTERVAL: 240 ms
MDC IDC STAT BRADY AP VP PERCENT: 0.09 %
Zone Setting Detection Interval: 300 ms
Zone Setting Detection Interval: 350 ms
Zone Setting Detection Interval: 360 ms
Zone Setting Detection Interval: 370 ms

## 2015-05-23 NOTE — Progress Notes (Signed)
Wound check appointment. Staples removed. Wound without redness or edema. Incision edges approximated, wound well healed. Normal device function. Thresholds, sensing, and impedances consistent with implant measurements. Device programmed at 3.5V for extra safety margin until 3 month visit. Histogram distribution appropriate for patient and level of activity. No mode switches or ventricular arrhythmias noted. Patient educated about wound care, arm mobility, lifting restrictions, shock plan. ROV on 8/16 @ 1000 w/MC.

## 2015-05-24 ENCOUNTER — Other Ambulatory Visit: Payer: Self-pay | Admitting: Cardiovascular Disease

## 2015-05-26 NOTE — Telephone Encounter (Signed)
Rx has been sent to the pharmacy electronically. ° °

## 2015-06-03 ENCOUNTER — Encounter: Payer: Self-pay | Admitting: Cardiovascular Disease

## 2015-08-05 ENCOUNTER — Ambulatory Visit (INDEPENDENT_AMBULATORY_CARE_PROVIDER_SITE_OTHER): Payer: Medicare Other | Admitting: Cardiovascular Disease

## 2015-08-05 VITALS — BP 124/82 | HR 80 | Ht 79.0 in | Wt 369.0 lb

## 2015-08-05 DIAGNOSIS — I25708 Atherosclerosis of coronary artery bypass graft(s), unspecified, with other forms of angina pectoris: Secondary | ICD-10-CM

## 2015-08-05 DIAGNOSIS — I255 Ischemic cardiomyopathy: Secondary | ICD-10-CM | POA: Diagnosis not present

## 2015-08-05 DIAGNOSIS — Z95810 Presence of automatic (implantable) cardiac defibrillator: Secondary | ICD-10-CM

## 2015-08-05 DIAGNOSIS — Z9189 Other specified personal risk factors, not elsewhere classified: Secondary | ICD-10-CM | POA: Diagnosis not present

## 2015-08-05 DIAGNOSIS — I5022 Chronic systolic (congestive) heart failure: Secondary | ICD-10-CM

## 2015-08-05 NOTE — Patient Instructions (Signed)
Remote monitoring is used to monitor your Pacemaker or ICD from home. This monitoring reduces the number of office visits required to check your device to one time per year. It allows us to monitor the functioning of your device to ensure it is working properly. You are scheduled for a device check from home on November 07, 2015. You may send your transmission at any time that day. If you have a wireless device, the transmission will be sent automatically. After your physician reviews your transmission, you will receive a postcard with your next transmission date.  Dr. Croitoru recommends that you schedule a follow-up appointment in: ONE YEAR     

## 2015-08-05 NOTE — Progress Notes (Signed)
Patient ID: [name], male   DOB: 08/05/1946, 69 y.o.   MRN: [id number]     Cardiology Office Note   Date:  08/07/2015   ID:  [name], DOB 1946/07/13, MRN [id number]  PCP:  Devra Dopp, MD  Cardiologist:   Thurmon Fair, MD   Chief Complaint  Patient presents with  . Phys Pacer Check    Patient has no complaints.      History of Present Illness: [name] is a 69 y.o. male who presents for follow-up roughly 3 months following implantation of a dual-chamber permanent defibrillator for severe ischemic cardiomyopathy. He remains extremely sedentary but denies problems with exertional angina or dyspnea at the level of exercise that he performs. He has not had syncope or palpitations and denies lower extremity edema. He still smokes an occasional cigar.  Interrogation of his device shows normal function (Medtronic evera XT). He has not required any ventricular pacing. He does have atrial paced rhythm 63% of the time. A single brief episode of ventricular tachycardia (nonsustained, 10 beats, 218 bpm) was recorded on July 2 in the middle of the night and was asymptomatic. His thoracic impedance trend is very steady without evidence of fluid overload. No atrial fibrillation has been recorded. Estimated generator longevity 9.4 years.  Abbas had an anterior STEMI on Feb 01, 2015, due to acute occlusion of the previously stented SVG-diagonal artery and had emergency PCI with a 2.5 x 38 mm Promus DES and a downstream POBA of this vessel. The only other patent conduit is the LIMA to LAD (native vessels, SVG to RCA and SVG to LCX are all chronically occluded). He has at least moderately depressed LVEF. At one point LVEF was estimated at 15%, most recent echo probably overestimates his real ejection fraction. He is morbidly obese with a BMI well over 40. He has excellent control of diabetes mellitus with a hemoglobin A1c around 6%. It is very likely that he has obstructive sleep  apnea but he declines both diagnostic sleep study and treatment with CPAP.  Past Medical History  Diagnosis Date  . Ischemic cardiomyopathy   . CAD (coronary artery disease)   . CHF (congestive heart failure)   . Systemic hypertension   . Chronic kidney disease   . DM (diabetes mellitus)   . Morbid obesity   . CAD s/p CABG 1995, graft dependent (LIMA to LAD, SVG to diagonal stented, other grafts occluded) 01/25/2014    Angiography 2009 with total occlusion of all 3 proximal native coronary arteries, occlusion of the SVG to RCA and SVG to circumflex, patent LIMA to LAD (collaterals to the distal RCA and OM) and stenotic SVG to first diagonal treated with a drug-eluting Taxus atom stent  . Cardiomyopathy, ischemic 01/25/2014    LVEF 15%  . Chronic systolic CHF (congestive heart failure), NYHA class 2 01/25/2014  . DM type 2 causing CKD stage 3 01/25/2014  . Gout 01/25/2014  . Dyslipidemia - low HDL and high triglycerides 01/25/2014  . Myocardial infarction   . PONV (postoperative nausea and vomiting)     Past Surgical History  Procedure Laterality Date  . Coronary artery bypass graft  1995  . Coronary angioplasty with stent placement  01/12/2008    multivessel CAD occluded vein grafts to multiple sites including RCA,obtuse marginal branch & CX.  Successful PTCA and stenting distal graft.  . Left heart catheterization with coronary angiogram N/A 01/29/2015    Procedure: LEFT HEART CATHETERIZATION WITH CORONARY ANGIOGRAM;  Surgeon:  Lennette Bihari, MD;  Location: [facility] CATH LAB;  Service: Cardiovascular;  Laterality: N/A;  . Tonsillectomy    . Ep implantable device N/A 05/09/2015    Procedure: Icd Implant;  Surgeon: Thurmon Fair, MD;  Location: [facility] INVASIVE CV LAB;  Service: Cardiovascular;  Laterality: N/A;     Current Outpatient Prescriptions  Medication Sig Dispense Refill  . acetaminophen (TYLENOL) 325 MG tablet Take 1-2 tablets (325-650 mg total) by mouth every 4 (four) hours as needed for mild  pain.    Marland Kitchen allopurinol (ZYLOPRIM) 100 MG tablet Take 100 mg by mouth daily.    Marland Kitchen aspirin 81 MG tablet Take 81 mg by mouth daily.    Marland Kitchen atorvastatin (LIPITOR) 80 MG tablet take 1 tablet by mouth once daily 90 tablet 3  . BYSTOLIC 10 MG tablet Take 10 mg by mouth daily.   0  . cholecalciferol (VITAMIN D) 1000 UNITS tablet Take 1,000 Units by mouth every other day.    . furosemide (LASIX) 40 MG tablet Take 1 tablet (40 mg total) by mouth daily. 90 tablet 3  . HYDROcodone-acetaminophen (NORCO/VICODIN) 5-325 MG per tablet Take 1-2 tablets by mouth every 4 (four) hours as needed for moderate pain. 30 tablet 0  . insulin aspart (NOVOLOG) 100 UNIT/ML injection Inject 32 Units into the skin daily.    . INVOKANA 100 MG TABS Take 100 mg by mouth daily.    Marland Kitchen LANTUS 100 UNIT/ML injection Inject 100 Units into the skin daily.     Marland Kitchen lisinopril (PRINIVIL,ZESTRIL) 10 MG tablet Take 1 tablet (10 mg total) by mouth daily. 90 tablet 3  . nitroGLYCERIN (NITROSTAT) 0.4 MG SL tablet Place 1 tablet (0.4 mg total) under the tongue every 5 (five) minutes as needed for chest pain. 25 tablet 5  . Omega-3 Fatty Acids (FISH OIL) 1200 MG CAPS Take 1,200 mg by mouth daily.     Marland Kitchen spironolactone (ALDACTONE) 25 MG tablet take 1 tablet by mouth once daily 90 tablet 3  . ticagrelor (BRILINTA) 90 MG TABS tablet Take 1 tablet (90 mg total) by mouth 2 (two) times daily. 60 tablet 10  . VICTOZA 18 MG/3ML SOPN Inject 1.25 mg into the skin daily.     No current facility-administered medications for this visit.    Allergies:   Coreg    Social History:  The patient  reports that he has been smoking Cigarettes.  He has never used smokeless tobacco. He reports that he drinks alcohol. He reports that he does not use illicit drugs.   Family History:  The patient's family history includes Dementia in his mother; Hearing loss in his other; Heart failure in his mother; Hypertension in his other.    ROS:  Please see the history of present  illness.    Otherwise, review of systems positive for none.   All other systems are reviewed and negative.    PHYSICAL EXAM: VS:  BP 124/82 mmHg  Pulse 80  Ht  (2.007 m)  Wt 369 lb (167.377 kg)  BMI 41.55 kg/m2 , BMI Body mass index is 41.55 kg/(m^2).  Morbid obesity limits his examination  General: Alert, oriented x3, no distress Head: no evidence of trauma, PERRL, EOMI, no exophtalmos or lid lag, no myxedema, no xanthelasma; normal ears, nose and oropharynx Neck: normal jugular venous pulsations and no hepatojugular reflux; brisk carotid pulses without delay and no carotid bruits Chest: clear to auscultation, no signs of consolidation by percussion or palpation, normal fremitus, symmetrical and  full respiratory excursions. Large keloid sternotomy scar Cardiovascular: Not locate the apical impulse, regular rhythm, normal first and second heart sounds, no murmurs, rubs or gallops Abdomen: no tenderness or distention, no masses by palpation, no abnormal pulsatility or arterial bruits, normal bowel sounds, no hepatosplenomegaly Extremities: no clubbing, cyanosis or edema; 2+ radial, ulnar and brachial pulses bilaterally; 2+ right femoral, posterior tibial and dorsalis pedis pulses; 2+ left femoral, posterior tibial and dorsalis pedis pulses; no subclavian or femoral bruits Neurological: grossly nonfocal Psych: euthymic mood, full affect   EKG:  EKG is not ordered today.   Recent Labs: 05/02/2015: ALT 16; BUN 16; Creat 1.59*; Hemoglobin 16.5; Platelets 186; Potassium 4.2; Sodium 138    Lipid Panel    Component Value Date/Time   CHOL 104 01/29/2015 1131   TRIG 103 01/29/2015 1131   HDL 22* 01/29/2015 1131   CHOLHDL 4.7 01/29/2015 1131   VLDL 21 01/29/2015 1131   LDLCALC 61 01/29/2015 1131      Wt Readings from Last 3 Encounters:  08/05/15 369 lb (167.377 kg)  05/09/15 385 lb 12.9 oz (175 kg)  04/28/15 377 lb 14.4 oz (171.414 kg)      ASSESSMENT AND PLAN:  Normally  functioning recently implanted dual-chamber defibrillator  Remote downloads via [facility].   CAD s/p CABG with severe native and graft disease Dependent on LIMA-LAD and newly re-stented SVG-diagonal  Recent anterior STEMI and DES to SVG-diagonal Mandatory dual antiplatelet therapy for 12 months, preferably lifelong  Severe ischemic cardiomyopathy EF probably overestimated on the last study. Suspect it is still less than 20%  CHF, systolic and diastolic, chronic No symptoms of decompensation. Physical exam almost useless for diagnosis due to super obesity.  DM type 2 and hyperlipidemia Excellent metabolic parameters    Current medicines are reviewed at length with the patient today.  The patient does not have concerns regarding medicines.  The following changes have been made:  no change  Labs/ tests ordered today include:  No orders of the defined types were placed in this encounter.     Patient Instructions  Remote monitoring is used to monitor your Pacemaker or ICD from home. This monitoring reduces the number of office visits required to check your device to one time per year. It allows [place] to monitor the functioning of your device to ensure it is working properly. You are scheduled for a device check from home on November 07, 2015. You may send your transmission at any time that day. If you have a wireless device, the transmission will be sent automatically. After your physician reviews your transmission, you will receive a postcard with your next transmission date.  Dr. Royann Shivers recommends that you schedule a follow-up appointment in: ONE YEAR        SignedThurmon Fair, MD  08/07/2015 4:13 PM    Thurmon Fair, MD, [facility] [phone] office [phone] pager

## 2015-08-07 ENCOUNTER — Encounter: Payer: Self-pay | Admitting: Cardiovascular Disease

## 2015-08-08 LAB — CUP PACEART INCLINIC DEVICE CHECK
Battery Remaining Longevity: 113 mo
Battery Voltage: 3.05 V
Brady Statistic AP VP Percent: 0.06 %
Brady Statistic AP VS Percent: 62.56 %
Brady Statistic AS VP Percent: 0.03 %
Brady Statistic AS VS Percent: 37.35 %
Brady Statistic RA Percent Paced: 62.62 %
Brady Statistic RV Percent Paced: 0.09 %
Date Time Interrogation Session: 20160816142127
HighPow Impedance: 53 Ohm
HighPow Impedance: 68 Ohm
Lead Channel Impedance Value: 456 Ohm
Lead Channel Impedance Value: 513 Ohm
Lead Channel Impedance Value: 513 Ohm
Lead Channel Pacing Threshold Amplitude: 0.625 V
Lead Channel Pacing Threshold Amplitude: 0.875 V
Lead Channel Pacing Threshold Pulse Width: 0.4 ms
Lead Channel Pacing Threshold Pulse Width: 0.4 ms
Lead Channel Sensing Intrinsic Amplitude: 18.875 mV
Lead Channel Sensing Intrinsic Amplitude: 18.875 mV
Lead Channel Sensing Intrinsic Amplitude: 7.125 mV
Lead Channel Sensing Intrinsic Amplitude: 7.125 mV
Lead Channel Setting Pacing Amplitude: 1.75 V
Lead Channel Setting Pacing Amplitude: 2 V
Lead Channel Setting Pacing Pulse Width: 0.4 ms
Lead Channel Setting Sensing Sensitivity: 0.3 mV
Zone Setting Detection Interval: 240 ms
Zone Setting Detection Interval: 300 ms
Zone Setting Detection Interval: 350 ms
Zone Setting Detection Interval: 360 ms
Zone Setting Detection Interval: 370 ms

## 2015-09-01 ENCOUNTER — Encounter: Payer: Self-pay | Admitting: Cardiovascular Disease

## 2015-09-03 ENCOUNTER — Encounter: Payer: Self-pay | Admitting: Cardiovascular Disease

## 2015-11-06 ENCOUNTER — Telehealth: Payer: Self-pay | Admitting: Cardiology

## 2015-11-06 ENCOUNTER — Ambulatory Visit (INDEPENDENT_AMBULATORY_CARE_PROVIDER_SITE_OTHER): Payer: Medicare Other | Admitting: *Deleted

## 2015-11-06 DIAGNOSIS — I255 Ischemic cardiomyopathy: Secondary | ICD-10-CM

## 2015-11-06 NOTE — Telephone Encounter (Signed)
LMOVM reminding pt to send remote transmission.   

## 2015-11-06 NOTE — Progress Notes (Signed)
Remote ICD transmission.   

## 2015-11-12 LAB — CUP PACEART REMOTE DEVICE CHECK
Battery Remaining Longevity: 115 mo
Brady Statistic AP VP Percent: 0.07 %
Brady Statistic AP VS Percent: 69.67 %
Brady Statistic AS VP Percent: 0.03 %
Brady Statistic AS VS Percent: 30.24 %
Brady Statistic RA Percent Paced: 69.74 %
Brady Statistic RV Percent Paced: 0.09 %
Date Time Interrogation Session: 20161117174025
HIGH POWER IMPEDANCE MEASURED VALUE: 50 Ohm
HIGH POWER IMPEDANCE MEASURED VALUE: 63 Ohm
Lead Channel Impedance Value: 418 Ohm
Lead Channel Pacing Threshold Amplitude: 0.625 V
Lead Channel Sensing Intrinsic Amplitude: 19.375 mV
Lead Channel Setting Pacing Amplitude: 1.5 V
Lead Channel Setting Pacing Amplitude: 2 V
Lead Channel Setting Sensing Sensitivity: 0.3 mV
MDC IDC MSMT BATTERY VOLTAGE: 3.01 V
MDC IDC MSMT LEADCHNL RA IMPEDANCE VALUE: 513 Ohm
MDC IDC MSMT LEADCHNL RA PACING THRESHOLD AMPLITUDE: 0.75 V
MDC IDC MSMT LEADCHNL RA PACING THRESHOLD PULSEWIDTH: 0.4 ms
MDC IDC MSMT LEADCHNL RA SENSING INTR AMPL: 7 mV
MDC IDC MSMT LEADCHNL RA SENSING INTR AMPL: 7 mV
MDC IDC MSMT LEADCHNL RV IMPEDANCE VALUE: 475 Ohm
MDC IDC MSMT LEADCHNL RV PACING THRESHOLD PULSEWIDTH: 0.4 ms
MDC IDC MSMT LEADCHNL RV SENSING INTR AMPL: 19.375 mV
MDC IDC SET LEADCHNL RV PACING PULSEWIDTH: 0.4 ms

## 2015-11-19 ENCOUNTER — Encounter: Payer: Self-pay | Admitting: Cardiology

## 2015-11-21 ENCOUNTER — Ambulatory Visit: Payer: Medicare Other | Admitting: Cardiovascular Disease

## 2015-12-03 ENCOUNTER — Ambulatory Visit (INDEPENDENT_AMBULATORY_CARE_PROVIDER_SITE_OTHER): Payer: Medicare Other | Admitting: Cardiovascular Disease

## 2015-12-03 ENCOUNTER — Ambulatory Visit: Payer: Medicare Other | Admitting: Cardiovascular Disease

## 2015-12-03 ENCOUNTER — Encounter: Payer: Self-pay | Admitting: Cardiovascular Disease

## 2015-12-03 VITALS — BP 126/86 | HR 84 | Ht 79.0 in | Wt 365.3 lb

## 2015-12-03 DIAGNOSIS — I480 Paroxysmal atrial fibrillation: Secondary | ICD-10-CM | POA: Diagnosis not present

## 2015-12-03 DIAGNOSIS — I255 Ischemic cardiomyopathy: Secondary | ICD-10-CM

## 2015-12-03 DIAGNOSIS — I5022 Chronic systolic (congestive) heart failure: Secondary | ICD-10-CM

## 2015-12-03 DIAGNOSIS — I25708 Atherosclerosis of coronary artery bypass graft(s), unspecified, with other forms of angina pectoris: Secondary | ICD-10-CM | POA: Diagnosis not present

## 2015-12-03 DIAGNOSIS — I10 Essential (primary) hypertension: Secondary | ICD-10-CM

## 2015-12-03 DIAGNOSIS — E785 Hyperlipidemia, unspecified: Secondary | ICD-10-CM

## 2015-12-03 DIAGNOSIS — Z95810 Presence of automatic (implantable) cardiac defibrillator: Secondary | ICD-10-CM

## 2015-12-03 MED ORDER — APIXABAN 5 MG PO TABS
5.0000 mg | ORAL_TABLET | Freq: Two times a day (BID) | ORAL | Status: DC
Start: 2015-12-03 — End: 2016-05-31

## 2015-12-03 NOTE — Patient Instructions (Addendum)
Your physician has recommended you make the following change in your medication: STOP Brillinta. Start taking Eliquis 5 mg - one tablet by twice daily (morning and night)  Your physician recommends that you schedule a follow-up appointment in: 3 months with Dr. Royann Shiversroitoru

## 2015-12-03 NOTE — Progress Notes (Signed)
Patient ID: [name], male   DOB: [birth year], 69 y.o.   MRN: [id number]     Cardiology Office Note   Date:  12/03/2015   ID:  [name], DOB 08-Oct-1946, MRN [id number]  PCP:  Devra Dopp, MD  Cardiologist:   Thurmon Fair, MD   Chief Complaint  Patient presents with  . Follow-up    no complaints      History of Present Illness: GIAVANNI Gallegos is a 69 y.o. male who presents for newly diagnosed atrial fibrillation by routine ICD check. He is now roughly 6 months following implantation of a dual-chamber permanent defibrillator for severe ischemic cardiomyopathy. He remains extremely sedentary but denies problems with exertional angina or dyspnea at the level of exercise that he performs. He has not had syncope or palpitations and denies lower extremity edema. He still smokes an occasional cigar.  He had a 2 hour episode of atrial fibrillation in 07/2015, he is asymptomatic. He has 70% atrial pacing and no ventricular pacing. He is here today to discuss changes in anticoagulation to reduce his risk of stroke. His CHADSVASC score is 5.  Corbitt had an anterior STEMI on Feb 01, 2015, due to acute occlusion of the previously stented SVG-diagonal artery and had emergency PCI with a 2.5 x 38 mm Promus DES and a downstream POBA of this vessel. The only other patent conduit is the LIMA to LAD (native vessels, SVG to RCA and SVG to LCX are all chronically occluded). He has at least moderately depressed LVEF. At one point LVEF was estimated at 15%, most recent echo probably overestimates his real ejection fraction. He is morbidly obese with a BMI well over 40. He has excellent control of diabetes mellitus with a hemoglobin A1c around 6%. It is very likely that he has obstructive sleep apnea but he declines both diagnostic sleep study and treatment with CPAP  Past Medical History  Diagnosis Date  . Ischemic cardiomyopathy   . CAD (coronary artery disease)   . CHF (congestive heart  failure) (HCC)   . Systemic hypertension   . Chronic kidney disease   . DM (diabetes mellitus) (HCC)   . Morbid obesity (HCC)   . CAD s/p CABG 1995, graft dependent (LIMA to LAD, SVG to diagonal stented, other grafts occluded) 01/25/2014    Angiography 2009 with total occlusion of all 3 proximal native coronary arteries, occlusion of the SVG to RCA and SVG to circumflex, patent LIMA to LAD (collaterals to the distal RCA and OM) and stenotic SVG to first diagonal treated with a drug-eluting Taxus atom stent  . Cardiomyopathy, ischemic 01/25/2014    LVEF 15%  . Chronic systolic CHF (congestive heart failure), NYHA class 2 (HCC) 01/25/2014  . DM type 2 causing CKD stage 3 (HCC) 01/25/2014  . Gout 01/25/2014  . Dyslipidemia - low HDL and high triglycerides 01/25/2014  . Myocardial infarction (HCC)   . PONV (postoperative nausea and vomiting)     Past Surgical History  Procedure Laterality Date  . Coronary artery bypass graft  1995  . Coronary angioplasty with stent placement  01/12/2008    multivessel CAD occluded vein grafts to multiple sites including RCA,obtuse marginal branch & CX.  Successful PTCA and stenting distal graft.  . Left heart catheterization with coronary angiogram N/A 01/29/2015    Procedure: LEFT HEART CATHETERIZATION WITH CORONARY ANGIOGRAM;  Surgeon: Lennette Bihari, MD;  Location: [facility] CATH LAB;  Service: Cardiovascular;  Laterality: N/A;  . Tonsillectomy    .  Ep implantable device N/A 05/09/2015    Procedure: Icd Implant;  Surgeon: Thurmon FairMihai Kylyn Mcdade, MD;  Location: [facility] INVASIVE CV LAB;  Service: Cardiovascular;  Laterality: N/A;    Current Outpatient Prescriptions  Medication Sig Dispense Refill  . acetaminophen (TYLENOL) 325 MG tablet Take 1-2 tablets (325-650 mg total) by mouth every 4 (four) hours as needed for mild pain.    Marland Kitchen. allopurinol (ZYLOPRIM) 100 MG tablet Take 100 mg by mouth daily.    Marland Kitchen. aspirin 81 MG tablet Take 81 mg by mouth daily.    Marland Kitchen. atorvastatin (LIPITOR) 80 MG tablet  take 1 tablet by mouth once daily 90 tablet 3  . BYSTOLIC 10 MG tablet Take 10 mg by mouth daily.   0  . cholecalciferol (VITAMIN D) 1000 UNITS tablet Take 1,000 Units by mouth every other day.    . furosemide (LASIX) 40 MG tablet Take 1 tablet (40 mg total) by mouth daily. 90 tablet 3  . HYDROcodone-acetaminophen (NORCO/VICODIN) 5-325 MG per tablet Take 1-2 tablets by mouth every 4 (four) hours as needed for moderate pain. 30 tablet 0  . insulin aspart (NOVOLOG) 100 UNIT/ML injection Inject 32 Units into the skin daily.    . INVOKANA 100 MG TABS Take 100 mg by mouth daily.    Marland Kitchen. LANTUS 100 UNIT/ML injection Inject 100 Units into the skin daily.     Marland Kitchen. lisinopril (PRINIVIL,ZESTRIL) 10 MG tablet Take 1 tablet (10 mg total) by mouth daily. 90 tablet 3  . nitroGLYCERIN (NITROSTAT) 0.4 MG SL tablet Place 1 tablet (0.4 mg total) under the tongue every 5 (five) minutes as needed for chest pain. 25 tablet 5  . Omega-3 Fatty Acids (FISH OIL) 1200 MG CAPS Take 1,200 mg by mouth daily.     Marland Kitchen. spironolactone (ALDACTONE) 25 MG tablet take 1 tablet by mouth once daily 90 tablet 3  . VICTOZA 18 MG/3ML SOPN Inject 1.25 mg into the skin daily.    Marland Kitchen. apixaban (ELIQUIS) 5 MG TABS tablet Take 1 tablet (5 mg total) by mouth 2 (two) times daily. 60 tablet 5   No current facility-administered medications for this visit.    Allergies:   Coreg   Social History:  The patient  reports that he has been smoking Cigarettes.  He has never used smokeless tobacco. He reports that he drinks alcohol. He reports that he does not use illicit drugs.   Family History:  The patient's family history includes Dementia in his mother; Hearing loss in his other; Heart failure in his mother; Hypertension in his other.   ROS:   Please see the history of present illness.   There is skipped beats and joint swelling reported. ROS All other systems reviewed and are negative.   PHYSICAL EXAM: VS:  BP 126/86 mmHg  Pulse 84  Ht 6\' 7"   (2.007 m)  Wt 365 lb 4.8 oz (165.699 kg)  BMI 41.14 kg/m2 , Body mass index is 41.14 kg/(m^2). GEN: Well nourished, well developed, in no acute distress, morbidly obese HEENT: normal Neck: no JVD, carotid bruits, or masses Cardiac:RRR, occasional extra beats; no murmurs, rubs, or gallops,no edema  Respiratory:  clear to auscultation bilaterally, normal work of breathing GI: soft, nontender, nondistended, + BS MS: no deformity or atrophy Skin: warm and dry, no rash Neuro:  Alert and Oriented x 3, Strength and sensation are intact Psych: euthymic mood, full affect   EKG:  EKG is ordered today.  The ekg ordered today demonstrates NSR with frequent PACs,  old anteroseptal MI., inferolateral ST changes  Recent Labs: 05/02/2015: ALT 16; BUN 16; Creat 1.59*; Hemoglobin 16.5; Platelets 186; Potassium 4.2; Sodium 138    Lipid Panel    Component Value Date/Time   CHOL 104 01/29/2015 1131   TRIG 103 01/29/2015 1131   HDL 22* 01/29/2015 1131   CHOLHDL 4.7 01/29/2015 1131   VLDL 21 01/29/2015 1131   LDLCALC 61 01/29/2015 1131      Wt Readings from Last 3 Encounters:  12/03/15 365 lb 4.8 oz (165.699 kg)  08/05/15 369 lb (167.377 kg)  05/09/15 385 lb 12.9 oz (175 kg)      Other studies Reviewed: Additional studies/ records that were reviewed today include:   Implantable loop recorder: Conclusion    Abnormal ICD remote. New onset AF - duration 2 hours. CHADS2VASC is at least 4. Note sent to [facility] in [facility].    ASSESSMENT AND PLAN:  1. Coronary artery disease involving coronary bypass graft of native heart with other forms of angina pectoris (HCC)  - angina free on current medical regimen  2. Cardiomyopathy, ischemic   3. Chronic systolic CHF (congestive heart failure), NYHA class 2 (HCC)  - euvolemic clinically and by Optivol  4. AICD (automatic cardioverter/defibrillator) present  - normal device function, fairly high atrial pacing prevalence  5. Essential hypertension  -  controlled  6. Morbid obesity, unspecified obesity type (HCC)  - weight loss could contribute to lower afib burden  7. Dyslipidemia - low HDL and high triglycerides , but excellent LDL  8. Paroxysmal atrial fibrillation (HCC)  - discussed stroke risk, anticoagulant med options and bleeding risks with anticoagulants. - decided to pursue Eliquis 5 mg BID. Review in 3 months.    Medication Adjustments/Labs and Tests Ordered: Current medicines are reviewed at length with the patient today.  Concerns regarding medicines are outlined above.  Labs/Tests ordered today are listed below. Patient Instructions  Your physician has recommended you make the following change in your medication: STOP Brillinta. Start taking Eliquis 5 mg - one tablet by twice daily (morning and night)  Your physician recommends that you schedule a follow-up appointment in: 3 months with Dr. Lenard Forth, MD  12/03/2015 2:05 PM

## 2015-12-05 ENCOUNTER — Telehealth: Payer: Self-pay

## 2015-12-05 NOTE — Telephone Encounter (Signed)
Prior auth for Eliquis 5 mg sent to [facility]. 

## 2015-12-08 ENCOUNTER — Telehealth: Payer: Self-pay

## 2015-12-08 NOTE — Telephone Encounter (Signed)
Eliquis 5mg  approved per [facility] through 12/04/2016. [place] #[id number].

## 2016-02-05 ENCOUNTER — Ambulatory Visit (INDEPENDENT_AMBULATORY_CARE_PROVIDER_SITE_OTHER): Payer: Medicare Other | Admitting: *Deleted

## 2016-02-05 ENCOUNTER — Telehealth: Payer: Self-pay | Admitting: Cardiology

## 2016-02-05 DIAGNOSIS — I255 Ischemic cardiomyopathy: Secondary | ICD-10-CM

## 2016-02-05 NOTE — Progress Notes (Signed)
Remote ICD transmission.   

## 2016-02-05 NOTE — Telephone Encounter (Signed)
Confirmed remote transmission w/ pt wife.   

## 2016-02-22 LAB — CUP PACEART REMOTE DEVICE CHECK
Battery Remaining Longevity: 113 mo
Battery Voltage: 2.99 V
Brady Statistic AP VP Percent: 0.05 %
Brady Statistic AS VP Percent: 0.02 %
Brady Statistic AS VS Percent: 36.85 %
Brady Statistic RA Percent Paced: 63.13 %
HIGH POWER IMPEDANCE MEASURED VALUE: 50 Ohm
HighPow Impedance: 62 Ohm
Lead Channel Impedance Value: 418 Ohm
Lead Channel Impedance Value: 475 Ohm
Lead Channel Impedance Value: 513 Ohm
Lead Channel Pacing Threshold Amplitude: 0.75 V
Lead Channel Pacing Threshold Pulse Width: 0.4 ms
Lead Channel Sensing Intrinsic Amplitude: 20.875 mV
Lead Channel Sensing Intrinsic Amplitude: 20.875 mV
Lead Channel Sensing Intrinsic Amplitude: 6.5 mV
Lead Channel Setting Pacing Amplitude: 2 V
MDC IDC MSMT LEADCHNL RA PACING THRESHOLD PULSEWIDTH: 0.4 ms
MDC IDC MSMT LEADCHNL RA SENSING INTR AMPL: 6.5 mV
MDC IDC MSMT LEADCHNL RV PACING THRESHOLD AMPLITUDE: 0.75 V
MDC IDC SESS DTM: 20170216155530
MDC IDC SET LEADCHNL RA PACING AMPLITUDE: 1.5 V
MDC IDC SET LEADCHNL RV PACING PULSEWIDTH: 0.4 ms
MDC IDC SET LEADCHNL RV SENSING SENSITIVITY: 0.3 mV
MDC IDC STAT BRADY AP VS PERCENT: 63.08 %
MDC IDC STAT BRADY RV PERCENT PACED: 0.07 %

## 2016-02-22 NOTE — Progress Notes (Signed)
Normal remote reviewed. 1 NSVT episode.   Next [facility] 05/06/16

## 2016-03-03 ENCOUNTER — Encounter: Payer: Self-pay | Admitting: Cardiology

## 2016-03-08 ENCOUNTER — Encounter: Payer: Self-pay | Admitting: Cardiovascular Disease

## 2016-03-08 ENCOUNTER — Ambulatory Visit (INDEPENDENT_AMBULATORY_CARE_PROVIDER_SITE_OTHER): Payer: Medicare Other | Admitting: Cardiovascular Disease

## 2016-03-08 VITALS — BP 138/90 | HR 90 | Ht 79.5 in | Wt 373.4 lb

## 2016-03-08 DIAGNOSIS — I5022 Chronic systolic (congestive) heart failure: Secondary | ICD-10-CM

## 2016-03-08 DIAGNOSIS — I25708 Atherosclerosis of coronary artery bypass graft(s), unspecified, with other forms of angina pectoris: Secondary | ICD-10-CM | POA: Diagnosis not present

## 2016-03-08 DIAGNOSIS — Z95810 Presence of automatic (implantable) cardiac defibrillator: Secondary | ICD-10-CM

## 2016-03-08 DIAGNOSIS — I255 Ischemic cardiomyopathy: Secondary | ICD-10-CM

## 2016-03-08 DIAGNOSIS — I10 Essential (primary) hypertension: Secondary | ICD-10-CM | POA: Diagnosis not present

## 2016-03-08 DIAGNOSIS — Z794 Long term (current) use of insulin: Secondary | ICD-10-CM

## 2016-03-08 DIAGNOSIS — I480 Paroxysmal atrial fibrillation: Secondary | ICD-10-CM

## 2016-03-08 DIAGNOSIS — E1122 Type 2 diabetes mellitus with diabetic chronic kidney disease: Secondary | ICD-10-CM

## 2016-03-08 DIAGNOSIS — N183 Chronic kidney disease, stage 3 unspecified: Secondary | ICD-10-CM

## 2016-03-08 NOTE — Patient Instructions (Signed)
Your physician recommends that you continue on your current medications as directed. Please refer to the Current Medication list given to you today.  Remote monitoring is used to monitor your Pacemaker or ICD from home. This monitoring reduces the number of office visits required to check your device to one time per year. It allows us to keep an eye on the functioning of your device to ensure it is working properly. You are scheduled for a device check from home on June 09, 2016. You may send your transmission at any time that day. If you have a wireless device, the transmission will be sent automatically. After your physician reviews your transmission, you will receive a postcard with your next transmission date.  Dr Royann Shiversroitoru recommends that you schedule a follow-up appointment in 6 months. You will receive a reminder letter in the mail two months in advance. If you don't receive a letter, please call our office to schedule the follow-up appointment.  If you need a refill on your cardiac medications before your next appointment, please call your pharmacy.   **Please check your medications at home - Bystolic (Nebivolol) or Bisoprolol. You should not be taking both.

## 2016-03-08 NOTE — Progress Notes (Signed)
Patient ID: Matthew GanserGeorge V Gallegos, male   DOB: [birth year], 70 y.o.   MRN: [id number]    Cardiology Office Note    Date:  03/10/2016   ID:  Matthew GanserGeorge V Gallegos, DOB [birth year], MRN [id number]  PCP:  Devra DoppHOWELL, TAMIEKA, MD  Cardiologist:   Thurmon FairROITORU,Tatia Petrucci, MD   Chief Complaint  Patient presents with  . Follow-up    3 months--PAF  pt c/o congestion--getting over a cold, no other Sx.    History of Present Illness:  Matthew GanserGeorge V Gallegos is a 70 y.o. male with severe CAD and cardiomyopathy, combined systolic and diastolic HF, s/p ICD, PAF, morbid obesity.  He has had "congestion" and was wondering if it was related to recent med changes (Brilinta stopped, Eliquis started), but the symptoms have started to resolve spontaneously and it sounds like he is describing nasal allergies, not dyspnea or chest congestion.  He denies angina, orthopnea, edema, syncope, ICD shocks or palpitations. No neuro events and no bleeding.  ICD check shows normal device function. Optivol at baseline, without evidence of fluid overload. No recent sustained arrhythmia. Battery and lead parameters are normal. The burden of AF is low. His CHADSVASC score is 5.  Matthew StallionGeorge had an anterior STEMI on Feb 01, 2015, due to acute occlusion of the previously stented SVG-diagonal artery and had emergency PCI with a 2.5 x 38 mm Promus DES and a downstream POBA of this vessel. The only other patent conduit is the LIMA to LAD (native vessels, SVG to RCA and SVG to LCX are all chronically occluded). He has at least moderately depressed LVEF. At one point LVEF was estimated at 15%, most recent echo probably overestimates his real ejection fraction. He is morbidly obese with a BMI well over 40. He has excellent control of diabetes mellitus with a hemoglobin A1c around 6%. It is very likely that he has obstructive sleep apnea but he declines both diagnostic sleep study and treatment with CPAP  Past Medical History  Diagnosis Date  . Ischemic cardiomyopathy    . CAD (coronary artery disease)   . CHF (congestive heart failure) (HCC)   . Systemic hypertension   . Chronic kidney disease   . DM (diabetes mellitus) (HCC)   . Morbid obesity (HCC)   . CAD s/p CABG 1995, graft dependent (LIMA to LAD, SVG to diagonal stented, other grafts occluded) 01/25/2014    Angiography 2009 with total occlusion of all 3 proximal native coronary arteries, occlusion of the SVG to RCA and SVG to circumflex, patent LIMA to LAD (collaterals to the distal RCA and OM) and stenotic SVG to first diagonal treated with a drug-eluting Taxus atom stent  . Cardiomyopathy, ischemic 01/25/2014    LVEF 15%  . Chronic systolic CHF (congestive heart failure), NYHA class 2 (HCC) 01/25/2014  . DM type 2 causing CKD stage 3 (HCC) 01/25/2014  . Gout 01/25/2014  . Dyslipidemia - low HDL and high triglycerides 01/25/2014  . Myocardial infarction (HCC)   . PONV (postoperative nausea and vomiting)     Past Surgical History  Procedure Laterality Date  . Coronary artery bypass graft  1995  . Coronary angioplasty with stent placement  01/12/2008    multivessel CAD occluded vein grafts to multiple sites including RCA,obtuse marginal branch & CX.  Successful PTCA and stenting distal graft.  . Left heart catheterization with coronary angiogram N/A 01/29/2015    Procedure: LEFT HEART CATHETERIZATION WITH CORONARY ANGIOGRAM;  Surgeon: Lennette Biharihomas A Kelly, MD;  [facility];  Service: Cardiovascular;  Laterality: N/A;  . Tonsillectomy    . Ep implantable device N/A 05/09/2015    Procedure: Icd Implant;  Surgeon: Thurmon Fair, MD;  Location: [facility] INVASIVE CV LAB;  Service: Cardiovascular;  Laterality: N/A;    Outpatient Prescriptions Prior to Visit  Medication Sig Dispense Refill  . acetaminophen (TYLENOL) 325 MG tablet Take 1-2 tablets (325-650 mg total) by mouth every 4 (four) hours as needed for mild pain.    Marland Kitchen allopurinol (ZYLOPRIM) 100 MG tablet Take 100 mg by mouth daily.    Marland Kitchen apixaban (ELIQUIS) 5  MG TABS tablet Take 1 tablet (5 mg total) by mouth 2 (two) times daily. 60 tablet 5  . aspirin 81 MG tablet Take 81 mg by mouth daily.    Marland Kitchen atorvastatin (LIPITOR) 80 MG tablet take 1 tablet by mouth once daily 90 tablet 3  . BYSTOLIC 10 MG tablet Take 10 mg by mouth daily.   0  . cholecalciferol (VITAMIN D) 1000 UNITS tablet Take 1,000 Units by mouth every other day.    . furosemide (LASIX) 40 MG tablet Take 1 tablet (40 mg total) by mouth daily. 90 tablet 3  . HYDROcodone-acetaminophen (NORCO/VICODIN) 5-325 MG per tablet Take 1-2 tablets by mouth every 4 (four) hours as needed for moderate pain. 30 tablet 0  . insulin aspart (NOVOLOG) 100 UNIT/ML injection Inject 32 Units into the skin daily.    . INVOKANA 100 MG TABS Take 100 mg by mouth daily.    Marland Kitchen LANTUS 100 UNIT/ML injection Inject 100 Units into the skin daily.     Marland Kitchen lisinopril (PRINIVIL,ZESTRIL) 10 MG tablet Take 1 tablet (10 mg total) by mouth daily. 90 tablet 3  . nitroGLYCERIN (NITROSTAT) 0.4 MG SL tablet Place 1 tablet (0.4 mg total) under the tongue every 5 (five) minutes as needed for chest pain. 25 tablet 5  . Omega-3 Fatty Acids (FISH OIL) 1200 MG CAPS Take 1,200 mg by mouth daily.     Marland Kitchen spironolactone (ALDACTONE) 25 MG tablet take 1 tablet by mouth once daily 90 tablet 3  . VICTOZA 18 MG/3ML SOPN Inject 1.25 mg into the skin daily.     No facility-administered medications prior to visit.     Allergies:   Coreg   Social History   Social History  . Marital Status: Married    Spouse Name: N/A  . Number of Children: N/A  . Years of Education: N/A   Social History Main Topics  . Smoking status: Current Every Day Smoker    Types: Cigarettes  . Smokeless tobacco: Never Used     Comment: "1-2 cigarettes per day"  . Alcohol Use: 0.0 oz/week    0 Standard drinks or equivalent per week     Comment: rare  . Drug Use: No  . Sexual Activity: Not Asked   Other Topics Concern  . None   Social History Narrative     Family  History:  The patient's family history includes Dementia in his mother; Hearing loss in his other; Heart failure in his mother; Hypertension in his other.   ROS:   Please see the history of present illness.    ROS All other systems reviewed and are negative.   PHYSICAL EXAM:   VS:  BP 138/90 mmHg  Pulse 90  Ht 6' 7.5" (2.019 m)  Wt 169.373 kg (373 lb 6.4 oz)  BMI 41.55 kg/m2   GEN: Well nourished, well developed, in no acute distress, morbidly obese  HEENT: normal  Neck:  no JVD, carotid bruits, or masses Cardiac:RRR, occasional extra beats; no murmurs, rubs, or gallops,no edema  Respiratory: clear to auscultation bilaterally, normal work of breathing GI: soft, nontender, nondistended, + BS MS: no deformity or atrophy  Skin: warm and dry, no rash Neuro: Alert and Oriented x 3, Strength and sensation are intact Psych: euthymic mood, full affect  Wt Readings from Last 3 Encounters:  03/08/16 169.373 kg (373 lb 6.4 oz)  12/03/15 165.699 kg (365 lb 4.8 oz)  08/05/15 167.377 kg (369 lb)      Studies/Labs Reviewed:   EKG:  EKG is not ordered today.    Recent Labs: 05/02/2015: ALT 16; BUN 16; Creat 1.59*; Hemoglobin 16.5; Platelets 186; Potassium 4.2; Sodium 138   Lipid Panel    Component Value Date/Time   CHOL 104 01/29/2015 1131   TRIG 103 01/29/2015 1131   HDL 22* 01/29/2015 1131   CHOLHDL 4.7 01/29/2015 1131   VLDL 21 01/29/2015 1131   LDLCALC 61 01/29/2015 1131       ASSESSMENT:    1. Chronic systolic CHF (congestive heart failure), NYHA class 2 (HCC)   2. Paroxysmal atrial fibrillation (HCC)   3. Cardiomyopathy, ischemic   4. Coronary artery disease involving coronary bypass graft of native heart with other forms of angina pectoris (HCC)   5. Essential hypertension   6. Type 2 diabetes mellitus with stage 3 chronic kidney disease, with long-term current use of insulin (HCC)   7. Chronic kidney disease, stage 3 (moderate)   8. AICD (automatic  cardioverter/defibrillator) present   9. Morbid obesity, unspecified obesity type (HCC)   10. At risk for sudden cardiac death      PLAN:  In order of problems listed above:  1. CHF:  appears euvolemic, NYHA class 2 (sedentary) 2. PAF:  Episodes are rare, brief and rate controlled. Continue Eliquis. CHADSVasc 5. 3. CMP: exact EF uncertain with poor quality echo images. On beta blocker, ACEi and aldosterone antagonist 4. CAD: no angina, now off antiplatelet after starting anticoagulant, over 1 year since PCI-DES 5. HTN:  BP unusually high today, usually much lower. If confirmed at next appt, try to increase ACEi further. Make sure he is not taking Bystolic and bisoprolol (which he has listed on his wallet list off meds).  6. DM on insulin: well controlled.  7. CKD: stable creatinine 8. ICD:  Normal dvice funtion, Optivol at baseline, Carelink Q 3 mos, office visit Q6 mos. 9. Refuses sleep evaluation and CPAP, suspected of OSA.    Medication Adjustments/Labs and Tests Ordered: Current medicines are reviewed at length with the patient today.  Concerns regarding medicines are outlined above.  Medication changes, Labs and Tests ordered today are listed in the Patient Instructions below. Patient Instructions  Your physician recommends that you continue on your current medications as directed. Please refer to the Current Medication list given to you today.  Remote monitoring is used to monitor your Pacemaker or ICD from home. This monitoring reduces the number of office visits required to check your device to one time per year. It allows [place] to keep an eye on the functioning of your device to ensure it is working properly. You are scheduled for a device check from home on June 09, 2016. You may send your transmission at any time that day. If you have a wireless device, the transmission will be sent automatically. After your physician reviews your transmission, you will receive a postcard with your  next transmission date.  Dr  Roi Jafari recommends that you schedule a follow-up appointment in 6 months. You will receive a reminder letter in the mail two months in advance. If you don't receive a letter, please call our office to schedule the follow-up appointment.  If you need a refill on your cardiac medications before your next appointment, please call your pharmacy.   **Please check your medications at home - Bystolic (Nebivolol) or Bisoprolol. You should not be taking both.      Joie Bimler, MD  03/10/2016 7:29 PM    [facility] [address] [place], [place], [place]  [address] Phone: [phone]; Fax: [phone]

## 2016-03-11 LAB — CUP PACEART INCLINIC DEVICE CHECK: Date Time Interrogation Session: 20170323104951

## 2016-04-22 ENCOUNTER — Telehealth: Payer: Self-pay | Admitting: Nurse Practitioner

## 2016-04-22 NOTE — Telephone Encounter (Signed)
Patient would like to speak with someone regarding this patient's upcoming remote device check appt.   She is wanting to know if she will have to pay a copayment.

## 2016-04-27 ENCOUNTER — Other Ambulatory Visit: Payer: Self-pay | Admitting: Cardiovascular Disease

## 2016-05-25 ENCOUNTER — Other Ambulatory Visit: Payer: Self-pay | Admitting: Cardiovascular Disease

## 2016-05-25 NOTE — Telephone Encounter (Signed)
Rx(s) sent to pharmacy electronically.  

## 2016-05-31 ENCOUNTER — Other Ambulatory Visit: Payer: Self-pay | Admitting: Cardiovascular Disease

## 2016-06-10 ENCOUNTER — Telehealth: Payer: Self-pay | Admitting: Cardiology

## 2016-06-10 ENCOUNTER — Encounter: Payer: Medicare Other | Admitting: *Deleted

## 2016-06-10 NOTE — Telephone Encounter (Signed)
LMOVM reminding pt to send remote transmission.   

## 2016-06-11 ENCOUNTER — Encounter: Payer: Self-pay | Admitting: Cardiology

## 2016-06-24 ENCOUNTER — Inpatient Hospital Stay (HOSPITAL_COMMUNITY)
Admission: EM | Admit: 2016-06-24 | Discharge: 2016-06-30 | DRG: 246 | Disposition: A | Discharge status: Home / Self Care | Payer: Medicare Other | Attending: Cardiovascular Disease | Admitting: Cardiovascular Disease

## 2016-06-24 ENCOUNTER — Encounter (HOSPITAL_COMMUNITY): Payer: Self-pay

## 2016-06-24 ENCOUNTER — Emergency Department (HOSPITAL_COMMUNITY): Payer: Medicare Other

## 2016-06-24 DIAGNOSIS — D72829 Elevated white blood cell count, unspecified: Secondary | ICD-10-CM | POA: Diagnosis present

## 2016-06-24 DIAGNOSIS — Z6841 Body Mass Index (BMI) 40.0 and over, adult: Secondary | ICD-10-CM

## 2016-06-24 DIAGNOSIS — I480 Paroxysmal atrial fibrillation: Secondary | ICD-10-CM | POA: Diagnosis present

## 2016-06-24 DIAGNOSIS — I5043 Acute on chronic combined systolic (congestive) and diastolic (congestive) heart failure: Secondary | ICD-10-CM | POA: Diagnosis present

## 2016-06-24 DIAGNOSIS — I25700 Atherosclerosis of coronary artery bypass graft(s), unspecified, with unstable angina pectoris: Secondary | ICD-10-CM | POA: Diagnosis not present

## 2016-06-24 DIAGNOSIS — Z7901 Long term (current) use of anticoagulants: Secondary | ICD-10-CM | POA: Diagnosis not present

## 2016-06-24 DIAGNOSIS — I214 Non-ST elevation (NSTEMI) myocardial infarction: Secondary | ICD-10-CM | POA: Diagnosis present

## 2016-06-24 DIAGNOSIS — F17210 Nicotine dependence, cigarettes, uncomplicated: Secondary | ICD-10-CM | POA: Diagnosis present

## 2016-06-24 DIAGNOSIS — Z79899 Other long term (current) drug therapy: Secondary | ICD-10-CM | POA: Diagnosis not present

## 2016-06-24 DIAGNOSIS — I255 Ischemic cardiomyopathy: Secondary | ICD-10-CM

## 2016-06-24 DIAGNOSIS — I2582 Chronic total occlusion of coronary artery: Secondary | ICD-10-CM | POA: Diagnosis present

## 2016-06-24 DIAGNOSIS — E1122 Type 2 diabetes mellitus with diabetic chronic kidney disease: Secondary | ICD-10-CM | POA: Diagnosis present

## 2016-06-24 DIAGNOSIS — I25110 Atherosclerotic heart disease of native coronary artery with unstable angina pectoris: Secondary | ICD-10-CM | POA: Diagnosis present

## 2016-06-24 DIAGNOSIS — E11649 Type 2 diabetes mellitus with hypoglycemia without coma: Secondary | ICD-10-CM | POA: Diagnosis not present

## 2016-06-24 DIAGNOSIS — Z7982 Long term (current) use of aspirin: Secondary | ICD-10-CM

## 2016-06-24 DIAGNOSIS — I130 Hypertensive heart and chronic kidney disease with heart failure and stage 1 through stage 4 chronic kidney disease, or unspecified chronic kidney disease: Secondary | ICD-10-CM | POA: Diagnosis present

## 2016-06-24 DIAGNOSIS — E1159 Type 2 diabetes mellitus with other circulatory complications: Secondary | ICD-10-CM | POA: Diagnosis not present

## 2016-06-24 DIAGNOSIS — I5022 Chronic systolic (congestive) heart failure: Secondary | ICD-10-CM | POA: Diagnosis not present

## 2016-06-24 DIAGNOSIS — N183 Chronic kidney disease, stage 3 unspecified: Secondary | ICD-10-CM | POA: Insufficient documentation

## 2016-06-24 DIAGNOSIS — I10 Essential (primary) hypertension: Secondary | ICD-10-CM | POA: Diagnosis present

## 2016-06-24 DIAGNOSIS — Z794 Long term (current) use of insulin: Secondary | ICD-10-CM | POA: Diagnosis not present

## 2016-06-24 DIAGNOSIS — I200 Unstable angina: Secondary | ICD-10-CM

## 2016-06-24 DIAGNOSIS — Z720 Tobacco use: Secondary | ICD-10-CM

## 2016-06-24 DIAGNOSIS — Z888 Allergy status to other drugs, medicaments and biological substances status: Secondary | ICD-10-CM | POA: Diagnosis not present

## 2016-06-24 DIAGNOSIS — N289 Disorder of kidney and ureter, unspecified: Secondary | ICD-10-CM | POA: Diagnosis not present

## 2016-06-24 DIAGNOSIS — I5042 Chronic combined systolic (congestive) and diastolic (congestive) heart failure: Secondary | ICD-10-CM | POA: Diagnosis present

## 2016-06-24 DIAGNOSIS — Z95810 Presence of automatic (implantable) cardiac defibrillator: Secondary | ICD-10-CM

## 2016-06-24 DIAGNOSIS — Z9111 Patient's noncompliance with dietary regimen: Secondary | ICD-10-CM | POA: Diagnosis not present

## 2016-06-24 DIAGNOSIS — I25710 Atherosclerosis of autologous vein coronary artery bypass graft(s) with unstable angina pectoris: Secondary | ICD-10-CM | POA: Diagnosis not present

## 2016-06-24 DIAGNOSIS — N179 Acute kidney failure, unspecified: Secondary | ICD-10-CM | POA: Diagnosis present

## 2016-06-24 DIAGNOSIS — Z951 Presence of aortocoronary bypass graft: Secondary | ICD-10-CM | POA: Diagnosis not present

## 2016-06-24 DIAGNOSIS — I2510 Atherosclerotic heart disease of native coronary artery without angina pectoris: Secondary | ICD-10-CM | POA: Diagnosis present

## 2016-06-24 DIAGNOSIS — E785 Hyperlipidemia, unspecified: Secondary | ICD-10-CM | POA: Diagnosis present

## 2016-06-24 DIAGNOSIS — Z955 Presence of coronary angioplasty implant and graft: Secondary | ICD-10-CM

## 2016-06-24 DIAGNOSIS — Z91119 Patient's noncompliance with dietary regimen due to unspecified reason: Secondary | ICD-10-CM

## 2016-06-24 HISTORY — DX: Chronic kidney disease, stage 3 (moderate): N18.3

## 2016-06-24 HISTORY — DX: Patient's noncompliance with dietary regimen due to unspecified reason: Z91.119

## 2016-06-24 HISTORY — DX: Patient's noncompliance with dietary regimen: Z91.11

## 2016-06-24 HISTORY — DX: Paroxysmal atrial fibrillation: I48.0

## 2016-06-24 HISTORY — DX: Chronic kidney disease, stage 3 unspecified: N18.30

## 2016-06-24 HISTORY — DX: Elevated white blood cell count, unspecified: D72.829

## 2016-06-24 HISTORY — DX: Non-ST elevation (NSTEMI) myocardial infarction: I21.4

## 2016-06-24 HISTORY — DX: Chronic systolic (congestive) heart failure: I50.22

## 2016-06-24 LAB — I-STAT CHEM 8, ED
BUN: 22 mg/dL — AB (ref 6–20)
CALCIUM ION: 1.16 mmol/L (ref 1.12–1.23)
CHLORIDE: 104 mmol/L (ref 101–111)
Creatinine, Ser: 1.7 mg/dL — ABNORMAL HIGH (ref 0.61–1.24)
Glucose, Bld: 144 mg/dL — ABNORMAL HIGH (ref 65–99)
HCT: 52 % (ref 39.0–52.0)
Hemoglobin: 17.7 g/dL — ABNORMAL HIGH (ref 13.0–17.0)
Potassium: 4.1 mmol/L (ref 3.5–5.1)
SODIUM: 138 mmol/L (ref 135–145)
TCO2: 25 mmol/L (ref 0–100)

## 2016-06-24 LAB — CBC
HEMATOCRIT: 49.6 % (ref 39.0–52.0)
Hemoglobin: 16.6 g/dL (ref 13.0–17.0)
MCH: 29.5 pg (ref 26.0–34.0)
MCHC: 33.5 g/dL (ref 30.0–36.0)
MCV: 88.1 fL (ref 78.0–100.0)
Platelets: 168 10*3/uL (ref 150–400)
RBC: 5.63 MIL/uL (ref 4.22–5.81)
RDW: 15.4 % (ref 11.5–15.5)
WBC: 13.5 10*3/uL — AB (ref 4.0–10.5)

## 2016-06-24 LAB — BASIC METABOLIC PANEL
Anion gap: 7 (ref 5–15)
BUN: 20 mg/dL (ref 6–20)
CHLORIDE: 106 mmol/L (ref 101–111)
CO2: 24 mmol/L (ref 22–32)
Calcium: 9.4 mg/dL (ref 8.9–10.3)
Creatinine, Ser: 1.83 mg/dL — ABNORMAL HIGH (ref 0.61–1.24)
GFR calc Af Amer: 42 mL/min — ABNORMAL LOW (ref >60)
GFR calc non Af Amer: 36 mL/min — ABNORMAL LOW (ref >60)
GLUCOSE: 155 mg/dL — AB (ref 65–99)
POTASSIUM: 5.1 mmol/L (ref 3.5–5.1)
Sodium: 137 mmol/L (ref 135–145)

## 2016-06-24 LAB — I-STAT TROPONIN, ED: Troponin i, poc: 0.03 ng/mL (ref 0.00–0.08)

## 2016-06-24 LAB — TROPONIN I: Troponin I: 0.33 ng/mL (ref <0.03)

## 2016-06-24 LAB — APTT: aPTT: 32 seconds (ref 24–37)

## 2016-06-24 LAB — GLUCOSE, CAPILLARY: GLUCOSE-CAPILLARY: 119 mg/dL — AB (ref 65–99)

## 2016-06-24 LAB — HEPARIN LEVEL (UNFRACTIONATED): HEPARIN UNFRACTIONATED: 1.92 [IU]/mL — AB (ref 0.30–0.70)

## 2016-06-24 MED ORDER — SPIRONOLACTONE 25 MG PO TABS
25.0000 mg | ORAL_TABLET | Freq: Every day | ORAL | Status: DC
  Administered 2016-06-25 – 2016-06-27 (×3): 25 mg via ORAL
  Filled 2016-06-24 (×3): qty 1

## 2016-06-24 MED ORDER — MORPHINE SULFATE (PF) 2 MG/ML IV SOLN
1.0000 mg | INTRAVENOUS | Status: DC | PRN
Start: 2016-06-24 — End: 2016-06-28
  Administered 2016-06-26: 2 mg via INTRAVENOUS
  Filled 2016-06-24: qty 1

## 2016-06-24 MED ORDER — LISINOPRIL 10 MG PO TABS
10.0000 mg | ORAL_TABLET | Freq: Every day | ORAL | Status: DC
  Administered 2016-06-25 – 2016-06-27 (×3): 10 mg via ORAL
  Filled 2016-06-24 (×3): qty 1

## 2016-06-24 MED ORDER — ASPIRIN 81 MG PO CHEW: 324.0000 mg | CHEWABLE_TABLET | ORAL | Status: DC

## 2016-06-24 MED ORDER — FUROSEMIDE 40 MG PO TABS
40.0000 mg | ORAL_TABLET | Freq: Every day | ORAL | Status: DC
  Administered 2016-06-25 – 2016-06-30 (×5): 40 mg via ORAL
  Filled 2016-06-24 (×5): qty 1

## 2016-06-24 MED ORDER — HEPARIN (PORCINE) IN NACL 100-0.45 UNIT/ML-% IJ SOLN
1300.0000 [IU]/h | INTRAMUSCULAR | Status: DC
  Administered 2016-06-24: 1600 [IU]/h via INTRAVENOUS
  Administered 2016-06-26: 1300 [IU]/h via INTRAVENOUS
  Filled 2016-06-24 (×3): qty 250

## 2016-06-24 MED ORDER — LIRAGLUTIDE 18 MG/3ML ~~LOC~~ SOPN
1.2500 mg | PEN_INJECTOR | Freq: Every day | SUBCUTANEOUS | Status: DC
  Administered 2016-06-25 – 2016-06-27 (×3): 1.25 mg via SUBCUTANEOUS

## 2016-06-24 MED ORDER — NITROGLYCERIN 0.4 MG SL SUBL: 0.4000 mg | SUBLINGUAL_TABLET | SUBLINGUAL | Status: DC | PRN

## 2016-06-24 MED ORDER — VITAMIN D 1000 UNITS PO TABS
1000.0000 [IU] | ORAL_TABLET | ORAL | Status: DC
  Administered 2016-06-26 – 2016-06-30 (×3): 1000 [IU] via ORAL
  Filled 2016-06-24 (×3): qty 1

## 2016-06-24 MED ORDER — INSULIN ASPART 100 UNIT/ML ~~LOC~~ SOLN: 32.0000 [IU] | Freq: Every day | SUBCUTANEOUS | Status: DC

## 2016-06-24 MED ORDER — INSULIN ASPART 100 UNIT/ML ~~LOC~~ SOLN
0.0000 [IU] | Freq: Three times a day (TID) | SUBCUTANEOUS | Status: DC
  Administered 2016-06-25: 2 [IU] via SUBCUTANEOUS
  Administered 2016-06-28: 3 [IU] via SUBCUTANEOUS

## 2016-06-24 MED ORDER — HYDROCODONE-ACETAMINOPHEN 5-325 MG PO TABS
1.0000 | ORAL_TABLET | ORAL | Status: DC | PRN
  Administered 2016-06-25 – 2016-06-30 (×5): 2 via ORAL
  Administered 2016-06-30: 1 via ORAL
  Filled 2016-06-24 (×3): qty 2
  Filled 2016-06-24: qty 1
  Filled 2016-06-24 (×2): qty 2

## 2016-06-24 MED ORDER — ACETAMINOPHEN 325 MG PO TABS: 650.0000 mg | ORAL_TABLET | ORAL | Status: DC | PRN

## 2016-06-24 MED ORDER — ACETAMINOPHEN 325 MG PO TABS: 325.0000 mg | ORAL_TABLET | ORAL | Status: DC | PRN

## 2016-06-24 MED ORDER — CANAGLIFLOZIN 100 MG PO TABS
100.0000 mg | ORAL_TABLET | Freq: Every day | ORAL | Status: DC
  Administered 2016-06-25 – 2016-06-30 (×5): 100 mg via ORAL
  Filled 2016-06-24 (×6): qty 1

## 2016-06-24 MED ORDER — ASPIRIN 81 MG PO TABS: 81.0000 mg | ORAL_TABLET | Freq: Every day | ORAL | Status: DC

## 2016-06-24 MED ORDER — ONDANSETRON HCL 4 MG/2ML IJ SOLN: 4.0000 mg | Freq: Four times a day (QID) | INTRAMUSCULAR | Status: DC | PRN

## 2016-06-24 MED ORDER — ASPIRIN EC 81 MG PO TBEC
81.0000 mg | DELAYED_RELEASE_TABLET | Freq: Every day | ORAL | Status: DC
  Administered 2016-06-25 – 2016-06-27 (×3): 81 mg via ORAL
  Filled 2016-06-24 (×3): qty 1

## 2016-06-24 MED ORDER — ATORVASTATIN CALCIUM 80 MG PO TABS
80.0000 mg | ORAL_TABLET | Freq: Every day | ORAL | Status: DC
  Administered 2016-06-25 – 2016-06-27 (×3): 80 mg via ORAL
  Filled 2016-06-24 (×3): qty 1

## 2016-06-24 MED ORDER — SODIUM CHLORIDE 0.9 % IV BOLUS (SEPSIS)
1000.0000 mL | Freq: Once | INTRAVENOUS | Status: AC
  Administered 2016-06-24: 1000 mL via INTRAVENOUS

## 2016-06-24 MED ORDER — ASPIRIN 300 MG RE SUPP: 300.0000 mg | RECTAL | Status: DC

## 2016-06-24 MED ORDER — NITROGLYCERIN IN D5W 200-5 MCG/ML-% IV SOLN
10.0000 ug/min | INTRAVENOUS | Status: DC
  Administered 2016-06-24: 10 ug/min via INTRAVENOUS
  Administered 2016-06-26: 20 ug/min via INTRAVENOUS
  Filled 2016-06-24 (×3): qty 250

## 2016-06-24 MED ORDER — INSULIN GLARGINE 100 UNIT/ML ~~LOC~~ SOLN
100.0000 [IU] | Freq: Every day | SUBCUTANEOUS | Status: DC
  Administered 2016-06-25 – 2016-06-27 (×3): 100 [IU] via SUBCUTANEOUS
  Filled 2016-06-24 (×4): qty 1

## 2016-06-24 MED ORDER — NEBIVOLOL HCL 5 MG PO TABS
10.0000 mg | ORAL_TABLET | Freq: Every day | ORAL | Status: DC
  Administered 2016-06-25 – 2016-06-27 (×3): 10 mg via ORAL
  Filled 2016-06-24 (×5): qty 2

## 2016-06-24 MED ORDER — ALLOPURINOL 100 MG PO TABS
100.0000 mg | ORAL_TABLET | Freq: Every day | ORAL | Status: DC
  Administered 2016-06-25 – 2016-06-30 (×6): 100 mg via ORAL
  Filled 2016-06-24 (×6): qty 1

## 2016-06-24 NOTE — ED Notes (Signed)
Pt. Wallet, cellphone, and money from pocket given to wife at this time. Clothing put in patient belongings bag.

## 2016-06-24 NOTE — ED Notes (Signed)
Floor reports room is still being cleaned, will call when it is down and take report.

## 2016-06-24 NOTE — ED Notes (Signed)
Pharmacy informed RN that Heparin should not be started until this evening.

## 2016-06-24 NOTE — Progress Notes (Signed)
ANTICOAGULATION CONSULT NOTE - Initial Consult  Pharmacy Consult for heparin Indication: chest pain/ACS  Allergies  Allergen Reactions  . Coreg [Carvedilol]     nausea    Patient Measurements: Height: 6' 7.5" (201.9 cm) Weight: (!) 373 lb 7.4 oz (169.4 kg) (per last visit) IBW/kg (Calculated) : 94.85 Heparin Dosing Weight: 133kg   Vital Signs: Temp: 97.8 F (36.6 C) (07/06 1508) Temp Source: Oral (07/06 1508) BP: 120/83 mmHg (07/06 1508) Pulse Rate: 77 (07/06 1508)  Labs:  Recent Labs  06/24/16 1500  HGB 16.6  HCT 49.6  PLT 168    CrCl cannot be calculated (Patient has no serum creatinine result on file.).   Medical History: Past Medical History  Diagnosis Date  . Ischemic cardiomyopathy   . CAD (coronary artery disease)   . CHF (congestive heart failure) (HCC)   . Systemic hypertension   . Chronic kidney disease   . DM (diabetes mellitus) (HCC)   . Morbid obesity (HCC)   . CAD s/p CABG 1995, graft dependent (LIMA to LAD, SVG to diagonal stented, other grafts occluded) 01/25/2014    Angiography 2009 with total occlusion of all 3 proximal native coronary arteries, occlusion of the SVG to RCA and SVG to circumflex, patent LIMA to LAD (collaterals to the distal RCA and OM) and stenotic SVG to first diagonal treated with a drug-eluting Taxus atom stent  . Cardiomyopathy, ischemic 01/25/2014    LVEF 15%  . Chronic systolic CHF (congestive heart failure), NYHA class 2 (HCC) 01/25/2014  . DM type 2 causing CKD stage 3 (HCC) 01/25/2014  . Gout 01/25/2014  . Dyslipidemia - low HDL and high triglycerides 01/25/2014  . Myocardial infarction (HCC)   . PONV (postoperative nausea and vomiting)     Medications:  Infusions:  . heparin    . nitroGLYCERIN 10 mcg/min (06/24/16 1530)    Assessment: 70 yom presented to the ED with CP. He is on chronic apixaban for history of afib. Baseline H/H and platelets are WNL. Pt last took his apixaban this morning at 1030. He will be  transitioned to IV heparin in case he needs cardiac cath.   Goal of Therapy:  Heparin level 0.3-0.7 units/ml aPTT 66-102 seconds Monitor platelets by anticoagulation protocol: Yes   Plan:  - Check a baseline heparin level and aPTT - Heparin gtt 1600 units/hr starting tonight 2230 (when his next dose of apixaban would have been due) - Check an 8 hr heparin level and aPTT - Daily heparin level, aPTT and CBC  [name], [name] 06/24/2016,3:51 PM

## 2016-06-24 NOTE — ED Notes (Signed)
Pt. BIB [facility] for evaluation of Cp starting around 12 PM. Pt. Reports hx of MI 01/2015 with defibrillator placement shortly after. Pt. Initially reporting 9/10 pain with diaphoresis and SOB. Pt. Given 324 asa and 1 nitro, pain decreased to 7/10.

## 2016-06-24 NOTE — ED Provider Notes (Signed)
CSN: [id number]     Arrival date & time 06/24/16  1451 History   First MD Initiated Contact with Patient 06/24/16 1503     Chief Complaint  Patient presents with  . Chest Pain     (Consider location/radiation/quality/duration/timing/severity/associated sxs/prior Treatment) The history is provided by the patient.  [name] is a 70 y.o. male hx of CAD, CHF, DM, s/p CABG and cardiac stent here with chest pain, shortness of breath, diaphoresis. Patient was coming home from a funeral around 12 pm today. He has sudden onset substernal chest pain radiating to the left jaw and shoulder. It is associated with some diaphoresis and shortness of breath. Patient is taking eliquis after previous stent a year ago. Patient took nitro 1 SL and ASA 325 mg prior to arrival. EMS EKG showed possible STEMI.    Past Medical History  Diagnosis Date  . Ischemic cardiomyopathy   . CAD (coronary artery disease)   . CHF (congestive heart failure) (HCC)   . Systemic hypertension   . Chronic kidney disease   . DM (diabetes mellitus) (HCC)   . Morbid obesity (HCC)   . CAD s/p CABG 1995, graft dependent (LIMA to LAD, SVG to diagonal stented, other grafts occluded) 01/25/2014    Angiography 2009 with total occlusion of all 3 proximal native coronary arteries, occlusion of the SVG to RCA and SVG to circumflex, patent LIMA to LAD (collaterals to the distal RCA and OM) and stenotic SVG to first diagonal treated with a drug-eluting Taxus atom stent  . Cardiomyopathy, ischemic 01/25/2014    LVEF 15%  . Chronic systolic CHF (congestive heart failure), NYHA class 2 (HCC) 01/25/2014  . DM type 2 causing CKD stage 3 (HCC) 01/25/2014  . Gout 01/25/2014  . Dyslipidemia - low HDL and high triglycerides 01/25/2014  . Myocardial infarction (HCC)   . PONV (postoperative nausea and vomiting)    Past Surgical History  Procedure Laterality Date  . Coronary artery bypass graft  1995  . Coronary angioplasty with stent placement   01/12/2008    multivessel CAD occluded vein grafts to multiple sites including RCA,obtuse marginal branch & CX.  Successful PTCA and stenting distal graft.  . Left heart catheterization with coronary angiogram N/A 01/29/2015    Procedure: LEFT HEART CATHETERIZATION WITH CORONARY ANGIOGRAM;  Surgeon: Lennette Bihari, MD;  Location: [facility] C[facility] LAB;  Service: Cardiovascular;  Laterality: N/A;  . Tonsillectomy    . Ep implantable device N/A 05/09/2015    Procedure: Icd Implant;  Surgeon: Thurmon Fair, MD;  Location: [facility] INVASIVE CV LAB;  Service: Cardiovascular;  Laterality: N/A;   Family History  Problem Relation Age of Onset  . Heart failure Mother   . Dementia Mother   . Hearing loss Other   . Hypertension Other    Social History  Substance Use Topics  . Smoking status: Current Some Day Smoker    Types: Cigarettes  . Smokeless tobacco: Never Used     Comment: "1-2 cigarettes per day"  . Alcohol Use: 0.0 oz/week    0 Standard drinks or equivalent per week     Comment: rare    Review of Systems  Cardiovascular: Positive for chest pain.  All other systems reviewed and are negative.     Allergies  Coreg  Home Medications   Prior to Admission medications   Medication Sig Start Date End Date Taking? Authorizing Provider  acetaminophen (TYLENOL) 325 MG tablet Take 1-2 tablets (325-650 mg total) by mouth every  4 (four) hours as needed for mild pain. 05/10/15   Abelino DerrickLuke K Kilroy, PA-C  allopurinol (ZYLOPRIM) 100 MG tablet Take 100 mg by mouth daily.    Historical Provider, MD  aspirin 81 MG tablet Take 81 mg by mouth daily.    Historical Provider, MD  atorvastatin (LIPITOR) 80 MG tablet take 1 tablet by mouth once daily 05/25/16   Mihai Croitoru, MD  BYSTOLIC 10 MG tablet Take 10 mg by mouth daily.  01/21/15   Historical Provider, MD  cholecalciferol (VITAMIN D) 1000 UNITS tablet Take 1,000 Units by mouth every other day.    Historical Provider, MD  ELIQUIS 5 MG TABS tablet take 1 tablet by  mouth twice a day 05/31/16   Thurmon FairMihai Croitoru, MD  furosemide (LASIX) 40 MG tablet take 1 tablet by mouth once daily 04/27/16   Thurmon FairMihai Croitoru, MD  HYDROcodone-acetaminophen (NORCO/VICODIN) 5-325 MG per tablet Take 1-2 tablets by mouth every 4 (four) hours as needed for moderate pain. 05/10/15   Abelino DerrickLuke K Kilroy, PA-C  insulin aspart (NOVOLOG) 100 UNIT/ML injection Inject 32 Units into the skin daily.    Historical Provider, MD  INVOKANA 100 MG TABS Take 100 mg by mouth daily. 12/28/13   Historical Provider, MD  LANTUS 100 UNIT/ML injection Inject 100 Units into the skin daily.  12/28/13   Historical Provider, MD  lisinopril (PRINIVIL,ZESTRIL) 10 MG tablet take 1 tablet by mouth once daily 04/27/16   Mihai Croitoru, MD  nitroGLYCERIN (NITROSTAT) 0.4 MG SL tablet Place 1 tablet (0.4 mg total) under the tongue every 5 (five) minutes as needed for chest pain. 02/13/15   Mihai Croitoru, MD  Omega-3 Fatty Acids (FISH OIL) 1200 MG CAPS Take 1,200 mg by mouth daily.     Historical Provider, MD  spironolactone (ALDACTONE) 25 MG tablet take 1 tablet by mouth once daily 05/25/16   Mihai Croitoru, MD  VICTOZA 18 MG/3ML SOPN Inject 1.25 mg into the skin daily. 12/28/13   Historical Provider, MD   BP 120/83 mmHg  Pulse 77  Temp(Src) 97.8 F (36.6 C) (Oral)  Resp 14  Ht 6' 7.5" (2.019 m)  Wt 373 lb 7.4 oz (169.4 kg)  BMI 41.56 kg/m2  SpO2 97% Physical Exam  Constitutional: He is oriented to person, place, and time.  Diaphoretic, uncomfortable   HENT:  Head: Normocephalic.  Mouth/Throat: Oropharynx is clear and moist.  Eyes: Conjunctivae are normal. Pupils are equal, round, and reactive to light.  Neck: Normal range of motion. Neck supple.  Cardiovascular: Normal rate, regular rhythm and normal heart sounds.   Pulmonary/Chest: Effort normal and breath sounds normal. No respiratory distress. He has no wheezes. He has no rales.  Abdominal: Soft. Bowel sounds are normal. He exhibits no distension. There is no tenderness.  There is no rebound.  Musculoskeletal: Normal range of motion. He exhibits no edema or tenderness.  Neurological: He is alert and oriented to person, place, and time. No cranial nerve deficit. Coordination normal.  Skin: Skin is warm and dry.  Psychiatric: He has a normal mood and affect. His behavior is normal. Judgment and thought content normal.  Nursing note and vitals reviewed.   ED Course  Procedures (including critical care time)   CRITICAL CARE Performed by: Silverio LayYAO, Rutha Melgoza   Total critical care time: 30  minutes  Critical care time was exclusive of separately billable procedures and treating other patients.  Critical care was necessary to treat or prevent imminent or life-threatening deterioration.  Critical care was time spent  personally by me on the following activities: development of treatment plan with patient and/or surrogate as well as nursing, discussions with consultants, evaluation of patient's response to treatment, examination of patient, obtaining history from patient or surrogate, ordering and performing treatments and interventions, ordering and review of laboratory studies, ordering and review of radiographic studies, pulse oximetry and re-evaluation of patient's condition.   Labs Review Labs Reviewed  CBC - Abnormal; Notable for the following:    WBC 13.5 (*)    All other components within normal limits  BASIC METABOLIC PANEL  HEPARIN LEVEL (UNFRACTIONATED)  APTT  I-STAT TROPOININ, ED  I-STAT CHEM 8, ED    Imaging Review No results found. I have personally reviewed and evaluated these images and lab results as part of my medical decision-making.   EKG Interpretation   Date/Time:  Thursday June 24 2016 15:02:20 EDT Ventricular Rate:  97 PR Interval:    QRS Duration: 110 QT Interval:  389 QTC Calculation: 405 R Axis:   61 Text Interpretation:  Sinus rhythm Multiform ventricular premature  complexes Probable anterior infarct, age indeterminate  Borderline ST  elevation, inferior leads Lateral leads are also involved PVC new and  possible STEMI AVR with ST depressions that are new  Confirmed by Matis Monnier  MD,  Mercia Dowe ([id number]) on 06/24/2016 3:12:02 PM      MDM   Final diagnoses:  None   Ezequiel GanserGeorge V Carradine is a 70 y.o. male here with chest pain, shortness of breath, diaphoresis. Concerned for STEMI vs unstable angina. I called Dr. Eldridge DaceVaranasi, STEMI doc around 3:10 pm. He reviewed both EKGs and felt that they were similar to previous and will see the patient. Patient has hx of renal insufficiency and Dr. Eldridge DaceVaranasi wants to hold off on emergent cath and not to activate STEMI for now.  4:07 PM Dr. Eldridge DaceVaranasi saw patient. He will admit patient for r/o ACS. Trop neg x 1. On heparin and nitro drip for possible unstable angina. Cards to admit.    Richardean Canalavid H Aariyah Sampey, MD 06/24/16 [phone]

## 2016-06-24 NOTE — Progress Notes (Signed)
Pt states he is leaving, he is going 'H-O-M-E' because he did not yet get a meal tray. Wife came to room with food for pt. Will provide more education after pt has had chance to eat and is more open to discuss the importance of staying overnight.

## 2016-06-24 NOTE — H&P (Addendum)
Date: 06/24/2016               Patient Name:  [name] MRN: [id number]  DOB: 1946-08-02 Age / Sex: 70 y.o., male   PCP: Devra Doppamieka Howell, MD                   Attending cardiologist: Dr. Thurmon FairMihai Croitoru, MD                               Chief Complaint: chest tightness   History of Present Illness: 70 yo man with PMH of CAD (CABG 1995, and multiple stents), CHF, pAF (pacemaker), HTN, CKD,  HLD, and DM presents to the ED with chest tightness. The tightness started around 12 pm today while he was eating lunch after attending a funeral, it feels like a squeezing discomfort with radiation to his jaw or left arm. States he is familiar with the character of ischemic chest pain because of his complex cardiac history and this discomfort is worse then usual because he has never felt the pain radiate to his jaw and arm. He also became diaphoretic, nauseous and SOB. The patient decided to call EMS and began to feel some relief of the chest pain.  Currently the chest tightness is significantly reduced.   Meds: Current Facility-Administered Medications  Medication Dose Route Frequency Provider Last Rate Last Dose  . heparin ADULT infusion 100 units/mL (25000 units/27050mL sodium chloride 0.45%)  1,600 Units/hr Intravenous Continuous Rachel L Rumbarger, RPH      . morphine 2 MG/ML injection 1-4 mg  1-4 mg Intravenous Q4H PRN Corky CraftsJayadeep S Deijah Spikes, MD      . nitroGLYCERIN 50 mg in dextrose 5 % 250 mL (0.2 mg/mL) infusion  10-200 mcg/min Intravenous Titrated Richardean Canalavid H Yao, MD 3 mL/hr at 06/24/16 1530 10 mcg/min at 06/24/16 1530   Current Outpatient Prescriptions  Medication Sig Dispense Refill  . acetaminophen (TYLENOL) 325 MG tablet Take 1-2 tablets (325-650 mg total) by mouth every 4 (four) hours as needed for mild pain.    Marland Kitchen. allopurinol (ZYLOPRIM) 100 MG tablet Take 100 mg by mouth daily.    Marland Kitchen. aspirin 81 MG tablet Take 81 mg by mouth daily.    Marland Kitchen. atorvastatin (LIPITOR) 80 MG tablet take 1  tablet by mouth once daily 90 tablet 3  . BYSTOLIC 10 MG tablet Take 10 mg by mouth daily.   0  . cholecalciferol (VITAMIN D) 1000 UNITS tablet Take 1,000 Units by mouth every other day.    Marland Kitchen. ELIQUIS 5 MG TABS tablet take 1 tablet by mouth twice a day 60 tablet 5  . furosemide (LASIX) 40 MG tablet take 1 tablet by mouth once daily 90 tablet 3  . HYDROcodone-acetaminophen (NORCO/VICODIN) 5-325 MG per tablet Take 1-2 tablets by mouth every 4 (four) hours as needed for moderate pain. 30 tablet 0  . insulin aspart (NOVOLOG) 100 UNIT/ML injection Inject 32 Units into the skin daily.    . INVOKANA 100 MG TABS Take 100 mg by mouth daily.    Marland Kitchen. LANTUS 100 UNIT/ML injection Inject 100 Units into the skin daily.     Marland Kitchen. lisinopril (PRINIVIL,ZESTRIL) 10 MG tablet take 1 tablet by mouth once daily 90 tablet 3  . nitroGLYCERIN (NITROSTAT) 0.4 MG SL tablet Place 1 tablet (0.4 mg total) under the tongue every 5 (five) minutes as needed for chest pain. 25 tablet 5  . Omega-3  Fatty Acids (FISH OIL) 1200 MG CAPS Take 1,200 mg by mouth daily.     Marland Kitchen. spironolactone (ALDACTONE) 25 MG tablet take 1 tablet by mouth once daily 90 tablet 3  . VICTOZA 18 MG/3ML SOPN Inject 1.25 mg into the skin daily.      Allergies: Allergies as of 06/24/2016 - Review Complete 06/24/2016  Allergen Reaction Noted  . Coreg [carvedilol]  01/25/2014   Past Medical History  Diagnosis Date  . Ischemic cardiomyopathy   . CAD (coronary artery disease)   . CHF (congestive heart failure) (HCC)   . Systemic hypertension   . Chronic kidney disease   . DM (diabetes mellitus) (HCC)   . Morbid obesity (HCC)   . CAD s/p CABG 1995, graft dependent (LIMA to LAD, SVG to diagonal stented, other grafts occluded) 01/25/2014    Angiography 2009 with total occlusion of all 3 proximal native coronary arteries, occlusion of the SVG to RCA and SVG to circumflex, patent LIMA to LAD (collaterals to the distal RCA and OM) and stenotic SVG to first diagonal  treated with a drug-eluting Taxus atom stent  . Cardiomyopathy, ischemic 01/25/2014    LVEF 15%  . Chronic systolic CHF (congestive heart failure), NYHA class 2 (HCC) 01/25/2014  . DM type 2 causing CKD stage 3 (HCC) 01/25/2014  . Gout 01/25/2014  . Dyslipidemia - low HDL and high triglycerides 01/25/2014  . Myocardial infarction (HCC)   . PONV (postoperative nausea and vomiting)     Family History: The patient has a family history of Heart failure (Mother) and Hypertension   Social History: Smokes 1-2 cigarettes per day, 50 pack years smoking   Review of Systems: A complete ROS was negative except as per HPI.   Physical Exam: Blood pressure 120/83, pulse 77, temperature 97.8 F (36.6 C), temperature source Oral, resp. rate 14, height 6' 7.5" (2.019 m), weight 373 lb 7.4 oz (169.4 kg), SpO2 97 %.  Constitutional: AAOx3, NAD, pt is sitting in bed asking for a drink of water  Pulm: Chest sounds clear to auscultation  Cardiac: RRR, Normal S1 and S2, No murmurs, clicks, or rubs.  Abdomen: BS+, soft non- tender, non distended  Skin: b/l lower extremity scars from past graft harvest, acanthosis nigracans posterior neck    EKG: NSR, PVC, diffuse chronic ischemic changes  CXR: pending  Assessment & Plan by Problem: Active Problems:   Unstable angina (HCC)  - R/O acute coronary event: EKG changes are most likely due to chronic CAD, troponin 0.03, CP  improved even prior to nitro, may have been related to the stress of attending a funeral   - monitor pt on telemetry  - trend troponin   - continue nitro IV, ASA    - Start heparin this evening   - continue home meds - lipitor, and lasix    - NPO after midnight for possibility of procedures tomorrow  HTN   - monitor BP   - continue home med lisinopril  CKD   - BUN 22, Creatinine 1.7   - continue to monitor BUN, Crt       Dispo: Admit patient to Observation with expected length of stay less than 2 midnights.  Signed: Eulah PontNina Blum,  MD 06/24/2016, 3:52 PM  Pager: [phone]   I have examined the patient and reviewed assessment and plan and discussed with patient.  Agree with above as stated.  I personally reviewed the patient's cath films. Recurrent CP today after a funeral that is different than  his prior angina. THere are no clear targets for revascularization.  He has a completely occluded native circulation and severe graft disease.  I would not pursue cath unless the patient had a positive troponin.  Stress testing would not be helpful given the severe gradft disease.  COntinue medical therapy at this time.  Smoking cessation and weight loss.  SSI for DM.  CKD, slightly worse than 2016.  Lance Muss

## 2016-06-25 DIAGNOSIS — I2510 Atherosclerotic heart disease of native coronary artery without angina pectoris: Secondary | ICD-10-CM

## 2016-06-25 LAB — GLUCOSE, CAPILLARY
GLUCOSE-CAPILLARY: 112 mg/dL — AB (ref 65–99)
GLUCOSE-CAPILLARY: 134 mg/dL — AB (ref 65–99)
Glucose-Capillary: 93 mg/dL (ref 65–99)
Glucose-Capillary: 124 mg/dL — ABNORMAL HIGH (ref 65–99)

## 2016-06-25 LAB — CBC
HCT: 49.6 % (ref 39.0–52.0)
Hemoglobin: 16.4 g/dL (ref 13.0–17.0)
MCH: 28.8 pg (ref 26.0–34.0)
MCHC: 33.1 g/dL (ref 30.0–36.0)
MCV: 87 fL (ref 78.0–100.0)
PLATELETS: 184 10*3/uL (ref 150–400)
RBC: 5.7 MIL/uL (ref 4.22–5.81)
RDW: 15.6 % — ABNORMAL HIGH (ref 11.5–15.5)
WBC: 15.1 10*3/uL — AB (ref 4.0–10.5)

## 2016-06-25 LAB — BASIC METABOLIC PANEL
ANION GAP: 8 (ref 5–15)
BUN: 17 mg/dL (ref 6–20)
CALCIUM: 9.2 mg/dL (ref 8.9–10.3)
CO2: 26 mmol/L (ref 22–32)
Chloride: 104 mmol/L (ref 101–111)
Creatinine, Ser: 1.39 mg/dL — ABNORMAL HIGH (ref 0.61–1.24)
GFR calc non Af Amer: 51 mL/min — ABNORMAL LOW (ref >60)
GFR, EST AFRICAN AMERICAN: 59 mL/min — AB (ref >60)
Glucose, Bld: 101 mg/dL — ABNORMAL HIGH (ref 65–99)
POTASSIUM: 4.2 mmol/L (ref 3.5–5.1)
Sodium: 138 mmol/L (ref 135–145)

## 2016-06-25 LAB — PROTIME-INR
INR: 1.22 (ref 0.00–1.49)
PROTHROMBIN TIME: 15.6 s — AB (ref 11.6–15.2)

## 2016-06-25 LAB — TROPONIN I
TROPONIN I: 1.09 ng/mL — AB (ref <0.03)
TROPONIN I: 5.87 ng/mL — AB (ref <0.03)

## 2016-06-25 LAB — LIPID PANEL
CHOL/HDL RATIO: 4.3 ratio
CHOLESTEROL: 111 mg/dL (ref 0–200)
HDL: 26 mg/dL — ABNORMAL LOW (ref >40)
LDL Cholesterol: 63 mg/dL (ref 0–99)
Triglycerides: 108 mg/dL (ref <150)
VLDL: 22 mg/dL (ref 0–40)

## 2016-06-25 LAB — MRSA PCR SCREENING: MRSA BY PCR: NEGATIVE

## 2016-06-25 LAB — APTT
APTT: 106 s — AB (ref 24–37)
APTT: 50 s — AB (ref 24–37)
aPTT: 121 seconds — ABNORMAL HIGH (ref 24–37)

## 2016-06-25 MED ORDER — INSULIN ASPART 100 UNIT/ML ~~LOC~~ SOLN
32.0000 [IU] | Freq: Every day | SUBCUTANEOUS | Status: DC
  Administered 2016-06-25 – 2016-06-30 (×4): 32 [IU] via SUBCUTANEOUS

## 2016-06-25 NOTE — Progress Notes (Signed)
ANTICOAGULATION CONSULT NOTE  Pharmacy Consult for heparin Indication: chest pain/ACS  Allergies  Allergen Reactions  . Coreg [Carvedilol]     nausea    Patient Measurements: Height: 6' 7.5" (201.9 cm) Weight: (!) 373 lb 10.9 oz (169.5 kg) IBW/kg (Calculated) : 94.85 Heparin Dosing Weight: 133kg   Vital Signs: Temp: 98.1 F (36.7 C) (07/07 0752) Temp Source: Oral (07/07 0752) BP: 127/80 mmHg (07/07 0752) Pulse Rate: 71 (07/07 0752)  Labs:  Recent Labs  06/24/16 0058  06/24/16 1500 06/24/16 1558 06/24/16 1602 06/24/16 1605 06/24/16 2024 06/25/16 0701  HGB  --   < > 16.6  --   --  17.7*  --  16.4  HCT  --   --  49.6  --   --  52.0  --  49.6  PLT  --   --  168  --   --   --   --  184  APTT 50*  --   --   --  32  --   --   --   LABPROT 15.6*  --   --   --   --   --   --   --   INR 1.22  --   --   --   --   --   --   --   HEPARINUNFRC  --   --   --  1.92*  --   --   --   --   CREATININE  --   --  1.83*  --   --  1.70*  --  1.39*  TROPONINI 1.09*  --   --   --   --   --  0.33* 5.87*  < > = values in this interval not displayed.  Estimated Creatinine Clearance: 89.7 mL/min (by C-G formula based on Cr of 1.39).   Medical History: Past Medical History  Diagnosis Date  . Ischemic cardiomyopathy   . CAD (coronary artery disease)   . CHF (congestive heart failure) (HCC)   . Systemic hypertension   . Chronic kidney disease   . DM (diabetes mellitus) (HCC)   . Morbid obesity (HCC)   . CAD s/p CABG 1995, graft dependent (LIMA to LAD, SVG to diagonal stented, other grafts occluded) 01/25/2014    Angiography 2009 with total occlusion of all 3 proximal native coronary arteries, occlusion of the SVG to RCA and SVG to circumflex, patent LIMA to LAD (collaterals to the distal RCA and OM) and stenotic SVG to first diagonal treated with a drug-eluting Taxus atom stent  . Cardiomyopathy, ischemic 01/25/2014    LVEF 15%  . Chronic systolic CHF (congestive heart failure), NYHA class  2 (HCC) 01/25/2014  . DM type 2 causing CKD stage 3 (HCC) 01/25/2014  . Gout 01/25/2014  . Dyslipidemia - low HDL and high triglycerides 01/25/2014  . Myocardial infarction (HCC)   . PONV (postoperative nausea and vomiting)     Medications:  Infusions:  . heparin 1,600 Units/hr (06/24/16 2230)  . nitroGLYCERIN 35 mcg/min (06/25/16 0700)    Assessment: [name] presented to the ED with CP. He is on chronic apixaban for history of afib. Baseline H/H and platelets are WNL. Pt last took his apixaban yesterday at 1030. He will be transitioned to IV heparin in case he needs cardiac cath. Baseline aPTT 32 and HL 1.92.  This morning's aPTT is elevated at 106 sec on heparin 1600 units/hr. Nurse reports no issues with infusion or bleeding.  Goal  of Therapy:  Heparin level 0.3-0.7 units/ml aPTT 66-102 seconds Monitor platelets by anticoagulation protocol: Yes   Plan:  Reduce heparin to 1500 units/hr 8h aPTT/HL Daily heparin level, aPTT and CBC  Arlean Hoppingorey M. Newman PiesBall, PharmD, BCPS Clinical Pharmacist Pager [phone] 06/25/2016,8:38 AM

## 2016-06-25 NOTE — Progress Notes (Signed)
ANTICOAGULATION CONSULT NOTE  Pharmacy Consult for heparin Indication: chest pain/ACS  Allergies  Allergen Reactions  . Coreg [Carvedilol]     nausea    Patient Measurements: Height: 6' 7.5" (201.9 cm) Weight: (!) 373 lb 10.9 oz (169.5 kg) IBW/kg (Calculated) : 94.85 Heparin Dosing Weight: 133kg   Vital Signs: Temp: 98.1 F (36.7 C) (07/07 1551) Temp Source: Oral (07/07 1551) BP: 141/84 mmHg (07/07 1600) Pulse Rate: 67 (07/07 1600)  Labs:  Recent Labs  06/24/16 0058  06/24/16 1500 06/24/16 1558 06/24/16 1602 06/24/16 1605 06/24/16 2024 06/25/16 0701 06/25/16 1653  HGB  --   < > 16.6  --   --  17.7*  --  16.4  --   HCT  --   --  49.6  --   --  52.0  --  49.6  --   PLT  --   --  168  --   --   --   --  184  --   APTT 50*  --   --   --  32  --   --  106* 121*  LABPROT 15.6*  --   --   --   --   --   --   --   --   INR 1.22  --   --   --   --   --   --   --   --   HEPARINUNFRC  --   --   --  1.92*  --   --   --   --   --   CREATININE  --   --  1.83*  --   --  1.70*  --  1.39*  --   TROPONINI 1.09*  --   --   --   --   --  0.33* 5.87*  --   < > = values in this interval not displayed.  Estimated Creatinine Clearance: 89.7 mL/min (by C-G formula based on Cr of 1.39).   Assessment: [age] yom presented to the ED with CP. He is on apixaban for history of afib-last dose of apixaban was 7/6  ~1030. He was transitioned to IV heparin in case he needs cardiac cath.   This morning's aPTT was elevated and heparin rate was decreased. An aPTT this afternoon is still elevated at 121 seconds. No bleeding noted.  Goal of Therapy:  Heparin level 0.3-0.7 units/ml aPTT 66-102 seconds Monitor platelets by anticoagulation protocol: Yes   Plan:  Reduce heparin to 1400 units/hr Next aPTT with AM labs Daily heparin level/aPTT until correlating along with daily CBC  Makynlee Kressin D. Torrie Lafavor, PharmD, BCPS Clinical Pharmacist Pager: [phone] 06/25/2016 5:41 PM

## 2016-06-25 NOTE — Progress Notes (Signed)
EKG CRITICAL VALUE     12 lead EKG performed.  Critical value noted. Matthew Fortisaroline Schenck, RN notified.   [name], CCT 06/25/2016 6:50 AM

## 2016-06-25 NOTE — Progress Notes (Signed)
Pt did not eat but 25% of hospital provided breakfast. RN walked into patient room and found him eating hashbrowns, bacon, and eggs brought in by wife from outside of the hospital. RN educated pt and family on the unit's rules of not bringing in outside food/drinks and the importance of maintaining a cardiac/carb modified diet, especially given his chest pain and need for a potential cath. Pt responded "whatever" and that it was his wife's breakfast, but that he was just "taking a bite."

## 2016-06-25 NOTE — Care Management Important Message (Signed)
Important Message  Patient Details  Name: Matthew GanserGeorge V Priego MRN: [id number] Date of Birth: 1946-11-20   Medicare Important Message Given:  Yes    Bernadette HoitShoffner, Mays Paino Coleman 06/25/2016, 10:25 AM

## 2016-06-25 NOTE — Progress Notes (Signed)
Pt c/o new onset chest pain of an 8/10 first noticed 06:40 VS stable and no other complaints currently. Nitro increased, EKG performed and cardiology MD paged.  On re-assessment pain decreased to 5/10. Cardiology plans to follow up this am.

## 2016-06-25 NOTE — Progress Notes (Signed)
Subjective:  Pt had some Left upper CP this AM. On IV hep/NTG  Objective:  Temp:  [97.8 F (36.6 C)-98.2 F (36.8 C)] 98.1 F (36.7 C) (07/07 0752) Pulse Rate:  [66-79] 71 (07/07 0752) Resp:  [10-24] 18 (07/07 0752) BP: (105-138)/(68-91) 127/80 mmHg (07/07 0752) SpO2:  [95 %-100 %] 96 % (07/07 0752) Weight:  [373 lb 7.4 oz (169.4 kg)-373 lb 10.9 oz (169.5 kg)] 373 lb 10.9 oz (169.5 kg) (07/07 0400) Weight change:   Intake/Output from previous day: 07/06 0701 - 07/07 0700 In: 183.8 [I.V.:183.8] Out: 600 [Urine:600]  Intake/Output from this shift:    Physical Exam: General appearance: alert and no distress Neck: no adenopathy, no carotid bruit, no JVD, supple, symmetrical, trachea midline and thyroid not enlarged, symmetric, no tenderness/mass/nodules Lungs: clear to auscultation bilaterally Heart: regular rate and rhythm, S1, S2 normal, no murmur, click, rub or gallop Extremities: extremities normal, atraumatic, no cyanosis or edema  Lab Results: Results for orders placed or performed during the hospital encounter of 06/24/16 (from the past 48 hour(s))  Troponin I     Status: Abnormal   Collection Time: 06/24/16 12:58 AM  Result Value Ref Range   Troponin I 1.09 (HH) <0.03 ng/mL    Comment: CRITICAL VALUE NOTED.  VALUE IS CONSISTENT WITH PREVIOUSLY REPORTED AND CALLED VALUE.  Protime-INR     Status: Abnormal   Collection Time: 06/24/16 12:58 AM  Result Value Ref Range   Prothrombin Time 15.6 (H) 11.6 - 15.2 seconds   INR 1.22 0.00 - 1.49  APTT     Status: Abnormal   Collection Time: 06/24/16 12:58 AM  Result Value Ref Range   aPTT 50 (H) 24 - 37 seconds    Comment:        IF BASELINE aPTT IS ELEVATED, SUGGEST PATIENT RISK ASSESSMENT BE USED TO DETERMINE APPROPRIATE ANTICOAGULANT THERAPY.   Basic metabolic panel     Status: Abnormal   Collection Time: 06/24/16  3:00 PM  Result Value Ref Range   Sodium 137 135 - 145 mmol/L   Potassium 5.1 3.5 - 5.1 mmol/L     Comment: HEMOLYSIS AT THIS LEVEL MAY AFFECT RESULT   Chloride 106 101 - 111 mmol/L   CO2 24 22 - 32 mmol/L   Glucose, Bld 155 (H) 65 - 99 mg/dL   BUN 20 6 - 20 mg/dL   Creatinine, Ser 1.83 (H) 0.61 - 1.24 mg/dL   Calcium 9.4 8.9 - 10.3 mg/dL   GFR calc non Af Amer 36 (L) >60 mL/min   GFR calc Af Amer 42 (L) >60 mL/min    Comment: (NOTE) The eGFR has been calculated using the CKD EPI equation. This calculation has not been validated in all clinical situations. eGFR's persistently <60 mL/min signify possible Chronic Kidney Disease.    Anion gap 7 5 - 15  CBC     Status: Abnormal   Collection Time: 06/24/16  3:00 PM  Result Value Ref Range   WBC 13.5 (H) 4.0 - 10.5 K/uL   RBC 5.63 4.22 - 5.81 MIL/uL   Hemoglobin 16.6 13.0 - 17.0 g/dL   HCT 49.6 39.0 - 52.0 %   MCV 88.1 78.0 - 100.0 fL   MCH 29.5 26.0 - 34.0 pg   MCHC 33.5 30.0 - 36.0 g/dL   RDW 15.4 11.5 - 15.5 %   Platelets 168 150 - 400 K/uL  I-stat troponin, ED     Status: None   Collection Time: 06/24/16  3:08 PM  Result Value Ref Range   Troponin i, poc 0.03 0.00 - 0.08 ng/mL   Comment 3            Comment: Due to the release kinetics of cTnI, a negative result within the first hours of the onset of symptoms does not rule out myocardial infarction with certainty. If myocardial infarction is still suspected, repeat the test at appropriate intervals.   Heparin level (unfractionated)     Status: Abnormal   Collection Time: 06/24/16  3:58 PM  Result Value Ref Range   Heparin Unfractionated 1.92 (H) 0.30 - 0.70 IU/mL    Comment: RESULTS CONFIRMED BY MANUAL DILUTION        IF HEPARIN RESULTS ARE BELOW EXPECTED VALUES, AND PATIENT DOSAGE HAS BEEN CONFIRMED, SUGGEST FOLLOW UP TESTING OF ANTITHROMBIN III LEVELS.   APTT     Status: None   Collection Time: 06/24/16  4:02 PM  Result Value Ref Range   aPTT 32 24 - 37 seconds  I-stat chem 8, ed     Status: Abnormal   Collection Time: 06/24/16  4:05 PM  Result Value  Ref Range   Sodium 138 135 - 145 mmol/L   Potassium 4.1 3.5 - 5.1 mmol/L   Chloride 104 101 - 111 mmol/L   BUN 22 (H) 6 - 20 mg/dL   Creatinine, Ser 1.70 (H) 0.61 - 1.24 mg/dL   Glucose, Bld 144 (H) 65 - 99 mg/dL   Calcium, Ion 1.16 1.12 - 1.23 mmol/L   TCO2 25 0 - 100 mmol/L   Hemoglobin 17.7 (H) 13.0 - 17.0 g/dL   HCT 52.0 39.0 - 52.0 %  Troponin I     Status: Abnormal   Collection Time: 06/24/16  8:24 PM  Result Value Ref Range   Troponin I 0.33 (HH) <0.03 ng/mL    Comment: CRITICAL VALUE NOTED.  VALUE IS CONSISTENT WITH PREVIOUSLY REPORTED AND CALLED VALUE.  Glucose, capillary     Status: Abnormal   Collection Time: 06/24/16  9:50 PM  Result Value Ref Range   Glucose-Capillary 119 (H) 65 - 99 mg/dL  MRSA PCR Screening     Status: None   Collection Time: 06/24/16 11:33 PM  Result Value Ref Range   MRSA by PCR NEGATIVE NEGATIVE    Comment:        The GeneXpert MRSA Assay (FDA approved for NASAL specimens only), is one component of a comprehensive MRSA colonization surveillance program. It is not intended to diagnose MRSA infection nor to guide or monitor treatment for MRSA infections.   Lipid panel     Status: Abnormal   Collection Time: 06/25/16  3:33 AM  Result Value Ref Range   Cholesterol 111 0 - 200 mg/dL   Triglycerides 108 <150 mg/dL   HDL 26 (L) >40 mg/dL   Total CHOL/HDL Ratio 4.3 RATIO   VLDL 22 0 - 40 mg/dL   LDL Cholesterol 63 0 - 99 mg/dL    Comment:        Total Cholesterol/HDL:CHD Risk Coronary Heart Disease Risk Table                     Men   Women  1/2 Average Risk   3.4   3.3  Average Risk       5.0   4.4  2 X Average Risk   9.6   7.1  3 X Average Risk  23.4   11.0  Use the calculated Patient Ratio above and the CHD Risk Table to determine the patient's CHD Risk.        ATP III CLASSIFICATION (LDL):  <100     mg/dL   Optimal  100-129  mg/dL   Near or Above                    Optimal  130-159  mg/dL   Borderline  160-189  mg/dL    High  >190     mg/dL   Very High   Basic metabolic panel     Status: Abnormal   Collection Time: 06/25/16  7:01 AM  Result Value Ref Range   Sodium 138 135 - 145 mmol/L   Potassium 4.2 3.5 - 5.1 mmol/L   Chloride 104 101 - 111 mmol/L   CO2 26 22 - 32 mmol/L   Glucose, Bld 101 (H) 65 - 99 mg/dL   BUN 17 6 - 20 mg/dL   Creatinine, Ser 1.39 (H) 0.61 - 1.24 mg/dL   Calcium 9.2 8.9 - 10.3 mg/dL   GFR calc non Af Amer 51 (L) >60 mL/min   GFR calc Af Amer 59 (L) >60 mL/min    Comment: (NOTE) The eGFR has been calculated using the CKD EPI equation. This calculation has not been validated in all clinical situations. eGFR's persistently <60 mL/min signify possible Chronic Kidney Disease.    Anion gap 8 5 - 15  Troponin I     Status: Abnormal   Collection Time: 06/25/16  7:01 AM  Result Value Ref Range   Troponin I 5.87 (HH) <0.03 ng/mL    Comment: CRITICAL VALUE NOTED.  VALUE IS CONSISTENT WITH PREVIOUSLY REPORTED AND CALLED VALUE.  CBC     Status: Abnormal   Collection Time: 06/25/16  7:01 AM  Result Value Ref Range   WBC 15.1 (H) 4.0 - 10.5 K/uL   RBC 5.70 4.22 - 5.81 MIL/uL   Hemoglobin 16.4 13.0 - 17.0 g/dL   HCT 49.6 39.0 - 52.0 %   MCV 87.0 78.0 - 100.0 fL   MCH 28.8 26.0 - 34.0 pg   MCHC 33.1 30.0 - 36.0 g/dL   RDW 15.6 (H) 11.5 - 15.5 %   Platelets 184 150 - 400 K/uL  Glucose, capillary     Status: None   Collection Time: 06/25/16  7:50 AM  Result Value Ref Range   Glucose-Capillary 93 65 - 99 mg/dL   Comment 1 Repeat Test     Imaging: Imaging results have been reviewed  Tele- NSR  Assessment/Plan:   1. Active Problems: 2.   Unstable angina ([place]) 3.   Time Spent Directly with Patient:  20 minutes  Length of Stay:  LOS: 1 day   Pt admitted yesterday with [place]. On IV hep/NTG. Had recurrent CP this AM. EKG w/o acute changes. Trop mildly elevated 1.09, trending down. I have reviewed cine from 2/16. S/P Diag SVG PCI and stenting (long area of stenting) for  subtotaled SVG. All native vessels are occluded and LAD beyond LIMA diffusely diseased. No good percutaneous options. Will keep on IV Hep/NTG and keep in stepdown. Turn off tomorrow and ambulate. If recurrent CP will need cath to visualize Diag SVG.   Quay Burow 06/25/2016, 8:33 AM

## 2016-06-26 ENCOUNTER — Encounter (HOSPITAL_COMMUNITY): Payer: Self-pay | Admitting: Cardiovascular Disease

## 2016-06-26 DIAGNOSIS — E785 Hyperlipidemia, unspecified: Secondary | ICD-10-CM

## 2016-06-26 DIAGNOSIS — N183 Chronic kidney disease, stage 3 (moderate): Secondary | ICD-10-CM

## 2016-06-26 DIAGNOSIS — I214 Non-ST elevation (NSTEMI) myocardial infarction: Secondary | ICD-10-CM

## 2016-06-26 DIAGNOSIS — I5022 Chronic systolic (congestive) heart failure: Secondary | ICD-10-CM

## 2016-06-26 DIAGNOSIS — E1122 Type 2 diabetes mellitus with diabetic chronic kidney disease: Secondary | ICD-10-CM

## 2016-06-26 HISTORY — DX: Non-ST elevation (NSTEMI) myocardial infarction: I21.4

## 2016-06-26 LAB — CBC
HEMATOCRIT: 47.7 % (ref 39.0–52.0)
Hemoglobin: 15.7 g/dL (ref 13.0–17.0)
MCH: 28.9 pg (ref 26.0–34.0)
MCHC: 32.9 g/dL (ref 30.0–36.0)
MCV: 87.8 fL (ref 78.0–100.0)
PLATELETS: 158 10*3/uL (ref 150–400)
RBC: 5.43 MIL/uL (ref 4.22–5.81)
RDW: 15.5 % (ref 11.5–15.5)
WBC: 14 10*3/uL — ABNORMAL HIGH (ref 4.0–10.5)

## 2016-06-26 LAB — HEMOGLOBIN A1C
Hgb A1c MFr Bld: 6 % — ABNORMAL HIGH (ref 4.8–5.6)
Mean Plasma Glucose: 126 mg/dL

## 2016-06-26 LAB — GLUCOSE, CAPILLARY
GLUCOSE-CAPILLARY: 88 mg/dL (ref 65–99)
Glucose-Capillary: 109 mg/dL — ABNORMAL HIGH (ref 65–99)
Glucose-Capillary: 88 mg/dL (ref 65–99)
Glucose-Capillary: 124 mg/dL — ABNORMAL HIGH (ref 65–99)

## 2016-06-26 LAB — HEPARIN LEVEL (UNFRACTIONATED): Heparin Unfractionated: 1.04 IU/mL — ABNORMAL HIGH (ref 0.30–0.70)

## 2016-06-26 LAB — APTT
aPTT: 120 seconds — ABNORMAL HIGH (ref 24–37)
aPTT: 104 seconds — ABNORMAL HIGH (ref 24–37)

## 2016-06-26 MED ORDER — CYCLOBENZAPRINE HCL 10 MG PO TABS
5.0000 mg | ORAL_TABLET | Freq: Once | ORAL | Status: AC
  Administered 2016-06-26: 5 mg via ORAL
  Filled 2016-06-26: qty 1

## 2016-06-26 MED ORDER — CYCLOBENZAPRINE HCL 10 MG PO TABS
5.0000 mg | ORAL_TABLET | Freq: Two times a day (BID) | ORAL | Status: DC | PRN
  Administered 2016-06-26 – 2016-06-28 (×4): 5 mg via ORAL
  Filled 2016-06-26 (×5): qty 1

## 2016-06-26 MED ORDER — HEPARIN (PORCINE) IN NACL 100-0.45 UNIT/ML-% IJ SOLN
1250.0000 [IU]/h | INTRAMUSCULAR | Status: DC
  Administered 2016-06-27 (×2): 1250 [IU]/h via INTRAVENOUS
  Filled 2016-06-26 (×2): qty 250

## 2016-06-26 NOTE — Progress Notes (Addendum)
ANTICOAGULATION CONSULT NOTE Pharmacy Consult for heparin Indication: chest pain/ACS  Allergies  Allergen Reactions  . Coreg [Carvedilol]     nausea    Patient Measurements: Height: 6' 7.5" (201.9 cm) Weight: (!) 373 lb 10.9 oz (169.5 kg) IBW/kg (Calculated) : 94.85 Heparin Dosing Weight: 133kg   Vital Signs: Temp: 97.7 F (36.5 C) (07/08 0337) Temp Source: Oral (07/08 0337) BP: 98/79 mmHg (07/08 0337) Pulse Rate: 75 (07/07 2336)  Labs:  Recent Labs  06/24/16 0058  06/24/16 1500 06/24/16 1558  06/24/16 1605 06/24/16 2024 06/25/16 0701 06/25/16 1653 06/26/16 0401  HGB  --   < > 16.6  --   --  17.7*  --  16.4  --  15.7  HCT  --   < > 49.6  --   --  52.0  --  49.6  --  47.7  PLT  --   --  168  --   --   --   --  184  --  158  APTT 50*  --   --   --   < >  --   --  106* 121* 120*  LABPROT 15.6*  --   --   --   --   --   --   --   --   --   INR 1.22  --   --   --   --   --   --   --   --   --   HEPARINUNFRC  --   --   --  1.92*  --   --   --   --   --  1.04*  CREATININE  --   --  1.83*  --   --  1.70*  --  1.39*  --   --   TROPONINI 1.09*  --   --   --   --   --  0.33* 5.87*  --   --   < > = values in this interval not displayed.  Estimated Creatinine Clearance: 89.7 mL/min (by C-G formula based on Cr of 1.39).   Assessment: 70 yo male with ACS, h/o Afib and Eliquis on hold, for heparin    Goal of Therapy:  Heparin level 0.3-0.7 units/ml aPTT 66-102 seconds Monitor platelets by anticoagulation protocol: Yes   Plan:  Decrease Heparin 1300 units/hr PTT in 8 hrs  Geannie RisenGreg Abbott, PharmD, BCPS  06/26/2016 5:36 AM   Addendum: Follow up aPTT is 104 seconds which is still slightly above goal. Reduce heparin to 1250 units/hr and recheck aPTT in 8 hours.  Louie CasaJennifer Caelie Rems[place], PharmD, BCPS 06/26/2016, 3:10 PM

## 2016-06-26 NOTE — Progress Notes (Signed)
PATIENT ID: [id number] with CAD s/p CABG 1995, chronic systolic and diastolic heart failure, morbid obesity, hypertensive heart disease, s/p AICD, and diabetes mellitus type 2 here with NSTEMI.   SUBJECTIVE: Had recurrent chest pain overnight when nitro was turned down to 10.  PHYSICAL EXAM Filed Vitals:   06/25/16 1948 06/25/16 2336 06/26/16 0337 06/26/16 0755  BP: 98/65 113/77 98/79 105/90  Pulse: 60 75    Temp: 97.5 F (36.4 C) 97.7 F (36.5 C) 97.7 F (36.5 C) 97.8 F (36.6 C)  TempSrc: Oral Oral Oral Oral  Resp: 16 21 17 15   Height:      Weight:      SpO2: 95% 91% 90% 92%   General:  Well-appearing.  No acute distress.  Neck: No JVD Lungs:  CTAB.  No crackles, rhonchi or wheezes. Heart:  Distant heart sounds.  RRR.  No m/r/g.  Normal S1/S2 Abdomen:  Obese, soft, NT, ND.  +BS Extremities:  WWP.  No edema.   LABS: Lab Results  Component Value Date   TROPONINI 5.87* 06/25/2016   Results for orders placed or performed during the hospital encounter of 06/24/16 (from the past 24 hour(s))  Glucose, capillary     Status: Abnormal   Collection Time: 06/25/16 12:01 PM  Result Value Ref Range   Glucose-Capillary 112 (H) 65 - 99 mg/dL  Glucose, capillary     Status: Abnormal   Collection Time: 06/25/16  3:50 PM  Result Value Ref Range   Glucose-Capillary 124 (H) 65 - 99 mg/dL   Comment 1 Repeat Test   APTT     Status: Abnormal   Collection Time: 06/25/16  4:53 PM  Result Value Ref Range   aPTT 121 (H) 24 - 37 seconds  Glucose, capillary     Status: Abnormal   Collection Time: 06/25/16  9:06 PM  Result Value Ref Range   Glucose-Capillary 134 (H) 65 - 99 mg/dL   Comment 1 Capillary Specimen   CBC     Status: Abnormal   Collection Time: 06/26/16  4:01 AM  Result Value Ref Range   WBC 14.0 (H) 4.0 - 10.5 K/uL   RBC 5.43 4.22 - 5.81 MIL/uL   Hemoglobin 15.7 13.0 - 17.0 g/dL   HCT 16.147.7 09.639.0 - 04.552.0 %   MCV 87.8 78.0 - 100.0 fL   MCH 28.9 26.0 - 34.0 pg   MCHC 32.9 30.0 -  36.0 g/dL   RDW 40.915.5 81.111.5 - 91.415.5 %   Platelets 158 150 - 400 K/uL  APTT     Status: Abnormal   Collection Time: 06/26/16  4:01 AM  Result Value Ref Range   aPTT 120 (H) 24 - 37 seconds  Heparin level (unfractionated)     Status: Abnormal   Collection Time: 06/26/16  4:01 AM  Result Value Ref Range   Heparin Unfractionated 1.04 (H) 0.30 - 0.70 IU/mL    Intake/Output Summary (Last 24 hours) at 06/26/16 0757 Last data filed at 06/26/16 0700  Gross per 24 hour  Intake 709.19 ml  Output   1575 ml  Net -865.81 ml    Telemetry:  Sinus rhythm.  Frequent PVCs.  NSVT up to 3 beats. EKG: AP VS 74 bpm.  ST elevation in III with depressions laterally.  PVC.  Unchanged from prior.   ASSESSMENT AND PLAN:  Active Problems:   Unstable angina (HCC)   # NSTEMI: Troponin increased to 5.87 yesterday.  He had recurrent chest pain when weaning nitroglycerin.  Cath  films reviewed by Dr. Allyson Sabal and he has prior long area of stenting in the SVG to diagonal.  100% occlusion of native vessels and the LAD beyond the LIMA is diffusely diseased.  The hope was that he could be medically managed.  However, with recurrent chest pain on nitroglycerin and rising troponin, will plan for [facility] Monday.  Will ask for LVgram, as his EF cannot be assessed on echo due to body habitus.  Continue aspirin, heparin drip, nebivolol and nitroglycerin drip.   # Hypertensive heart disease: BP well-controlled on nebivolol and lisinopril.  # Hyperlipidemia: Continue atorvastatin. LDL 63.  # Chronic systolic and diastolic heart failure: Recent LVEF unknown.  Could not see his endocardium well on echo even with Definity.  Will get LV gram on Monday.  Currently euvolemic. Continue nebivolol and lisinopril.      Tarrin Menn C. Duke Salvia, MD, [facility] 06/26/2016 7:57 AM

## 2016-06-26 NOTE — Progress Notes (Signed)
EKG CRITICAL VALUE     12 lead EKG performed.  Critical value noted.  Aundra MilletMegan, RN notified.   Barb MerinoMarilyn R Stout, CCT 06/26/2016 7:27 AM

## 2016-06-27 LAB — GLUCOSE, CAPILLARY
GLUCOSE-CAPILLARY: 82 mg/dL (ref 65–99)
GLUCOSE-CAPILLARY: 120 mg/dL — AB (ref 65–99)
Glucose-Capillary: 93 mg/dL (ref 65–99)
Glucose-Capillary: 118 mg/dL — ABNORMAL HIGH (ref 65–99)

## 2016-06-27 LAB — CBC
HCT: 48.7 % (ref 39.0–52.0)
HEMOGLOBIN: 15.6 g/dL (ref 13.0–17.0)
MCH: 28.4 pg (ref 26.0–34.0)
MCHC: 32 g/dL (ref 30.0–36.0)
MCV: 88.5 fL (ref 78.0–100.0)
PLATELETS: 157 10*3/uL (ref 150–400)
RBC: 5.5 MIL/uL (ref 4.22–5.81)
RDW: 15.6 % — ABNORMAL HIGH (ref 11.5–15.5)
WBC: 12.9 10*3/uL — ABNORMAL HIGH (ref 4.0–10.5)

## 2016-06-27 LAB — APTT: aPTT: 76 seconds — ABNORMAL HIGH (ref 24–37)

## 2016-06-27 LAB — HEPARIN LEVEL (UNFRACTIONATED): Heparin Unfractionated: 0.66 IU/mL (ref 0.30–0.70)

## 2016-06-27 NOTE — Progress Notes (Signed)
Patient ambulated around the unit, denied any chest pain or SOB. HR sustaining mid 80's. No O2 used during ambulation. "I don't want my knees to lock up, so I need to ambulate." Patient returned to room and sat in end of bed and stated he was comfortable just sitting. Informed patient to call if he feels any chest pain or pressure.  Will continue to monitor closely.

## 2016-06-27 NOTE — Progress Notes (Signed)
ANTICOAGULATION CONSULT NOTE  Pharmacy Consult for heparin Indication: chest pain/ACS  Allergies  Allergen Reactions  . Coreg [Carvedilol]     nausea   Patient Measurements: Height: 6' 7.5" (201.9 cm) Weight: (!) 373 lb 10.9 oz (169.5 kg) IBW/kg (Calculated) : 94.85 Heparin Dosing Weight: 133kg   Vital Signs: Temp: 98.3 F (36.8 C) (07/09 0346) Temp Source: Oral (07/09 0346) BP: 110/98 mmHg (07/09 0346)  Labs:  Recent Labs  06/24/16 1500 06/24/16 1558  06/24/16 1605 06/24/16 2024 06/25/16 0701 06/25/16 1653 06/26/16 0401 06/26/16 1327  HGB 16.6  --   --  17.7*  --  16.4  --  15.7  --   HCT 49.6  --   --  52.0  --  49.6  --  47.7  --   PLT 168  --   --   --   --  184  --  158  --   APTT  --   --   < >  --   --  106* 121* 120* 104*  HEPARINUNFRC  --  1.92*  --   --   --   --   --  1.04*  --   CREATININE 1.83*  --   --  1.70*  --  1.39*  --   --   --   TROPONINI  --   --   --   --  0.33* 5.87*  --   --   --   < > = values in this interval not displayed.  Estimated Creatinine Clearance: 89.7 mL/min (by C-G formula based on Cr of 1.39).  Assessment: [name] is a [age] yom who presented to the ED with CP. He is on apixaban for history of afib-last dose of apixaban was 7/6  ~1030. He was transitioned to IV heparin and his current heparin rate is 1250 units/hr.  This morning's aPTT was 76 seconds and within desired range.  His HL is at 0.66 and seems to be correlating appropriately.  His CBC is stable as is his platelets and he is without noted bleeding complications.  Plans for cath on Monday due to recurrent CP.  Goal of Therapy:  Heparin Level 0.3-0.7 Monitor platelets by anticoagulation protocol: Yes   Plan:  Continue IV heparin at 1250 units/hr DC aPTT  Daily heparin level/CBC  Nadara MustardNita Anastasya Jewell, PharmD., MS Clinical Pharmacist Pager:  [phone] Thank you for allowing pharmacy to be part of this patients care team. 06/27/2016 7:41 AM

## 2016-06-27 NOTE — Progress Notes (Signed)
 PATIENT ID: [id number] with CAD s/p CABG 1995, chronic systolic and diastolic heart failure, morbid obesity, hypertensive heart disease, s/p AICD, and diabetes mellitus type 2 here with NSTEMI.   SUBJECTIVE: Feeling well.  Anxious to see what the cath shows.   PHYSICAL EXAM Filed Vitals:   06/27/16 0900 06/27/16 1000 06/27/16 1100 06/27/16 1145  BP:    101/45  Pulse:      Temp:    97.5 F (36.4 C)  TempSrc:    Oral  Resp: 21 12 23 17  Height:      Weight:      SpO2:    96%   General:  Well-appearing.  No acute distress.  Neck: No JVD Lungs:  CTAB.  No crackles, rhonchi or wheezes. Heart:  Distant heart sounds.  RRR.  No m/r/g.  Normal S1/S2 Abdomen:  Obese, soft, NT, ND.  +BS Extremities:  WWP.  No edema.   LABS: Lab Results  Component Value Date   TROPONINI 5.87* 06/25/2016   Results for orders placed or performed during the hospital encounter of 06/24/16 (from the past 24 hour(s))  APTT     Status: Abnormal   Collection Time: 06/26/16  1:27 PM  Result Value Ref Range   aPTT 104 (H) 24 - 37 seconds  Glucose, capillary     Status: None   Collection Time: 06/26/16  4:23 PM  Result Value Ref Range   Glucose-Capillary 88 65 - 99 mg/dL   Comment 1 Capillary Specimen   Glucose, capillary     Status: Abnormal   Collection Time: 06/26/16  9:18 PM  Result Value Ref Range   Glucose-Capillary 124 (H) 65 - 99 mg/dL   Comment 1 Capillary Specimen   CBC     Status: Abnormal   Collection Time: 06/27/16  6:23 AM  Result Value Ref Range   WBC 12.9 (H) 4.0 - 10.5 K/uL   RBC 5.50 4.22 - 5.81 MIL/uL   Hemoglobin 15.6 13.0 - 17.0 g/dL   HCT 48.7 39.0 - 52.0 %   MCV 88.5 78.0 - 100.0 fL   MCH 28.4 26.0 - 34.0 pg   MCHC 32.0 30.0 - 36.0 g/dL   RDW 15.6 (H) 11.5 - 15.5 %   Platelets 157 150 - 400 K/uL  Heparin level (unfractionated)     Status: None   Collection Time: 06/27/16  6:23 AM  Result Value Ref Range   Heparin Unfractionated 0.66 0.30 - 0.70 IU/mL  APTT     Status:  Abnormal   Collection Time: 06/27/16  6:23 AM  Result Value Ref Range   aPTT 76 (H) 24 - 37 seconds  Glucose, capillary     Status: None   Collection Time: 06/27/16  7:56 AM  Result Value Ref Range   Glucose-Capillary 93 65 - 99 mg/dL   Comment 1 Capillary Specimen   Glucose, capillary     Status: None   Collection Time: 06/27/16 11:45 AM  Result Value Ref Range   Glucose-Capillary 82 65 - 99 mg/dL   Comment 1 Capillary Specimen     Intake/Output Summary (Last 24 hours) at 06/27/16 1305 Last data filed at 06/27/16 1100  Gross per 24 hour  Intake    536 ml  Output   1300 ml  Net   -764 ml    Telemetry:  Sinus rhythm.  Frequent PVCs.  NSVT up to 3 beats. EKG: AP VS 74 bpm.  ST elevation in III with depressions laterally.  PVC.    Unchanged from prior.   ASSESSMENT AND PLAN:  Active Problems:   CAD s/p CABG 1995, graft dependent (LIMA to LAD, SVG to diagonal stented, other grafts occluded)   Chronic systolic CHF (congestive heart failure), NYHA class 2 (HCC)   Morbid obesity (HCC)   DM type 2 causing CKD stage 3 (HCC)   Dyslipidemia - low HDL and high triglycerides   Unstable angina (HCC)   NSTEMI (non-ST elevated myocardial infarction) (HCC)   # NSTEMI: Troponin at least 5.87. He had recurrent chest pain when weaning nitroglycerin.  Cath films reviewed by Dr. Allyson SabalBerry and he has prior long area of stenting in the SVG to diagonal.  100% occlusion of native vessels and the LAD beyond the LIMA is diffusely diseased.  The hope was that he could be medically managed.  However, with recurrent chest pain on nitroglycerin and rising troponin, [facility]  Will ask for LVgram, as his EF cannot be assessed on echo due to body habitus.  Continue aspirin, heparin drip, nebivolol and nitroglycerin drip.   Risks and benefits of cardiac catheterization have been discussed with the patient.  The patient understands that risks included but are not limited to stroke (1 in 1000), death  (1 in 1000), kidney failure [usually temporary] (1 in 500), bleeding (1 in 200), allergic reaction [possibly serious] (1 in 200). The patient understands and agrees to proceed.   # Hypertensive heart disease: BP well-controlled on nebivolol and lisinopril.  # Hyperlipidemia: Continue atorvastatin. LDL 63.  # Chronic systolic and diastolic heart failure: Recent LVEF unknown.  Could not see his endocardium well on echo even with Definity.  Will get LV gram on Monday.  Currently euvolemic. Continue nebivolol and lisinopril.      Librada Castronovo C. Duke Salviaandolph, MD, [facility] 06/27/2016 1:05 PM

## 2016-06-28 ENCOUNTER — Encounter (HOSPITAL_COMMUNITY)
Admission: EM | Disposition: A | Discharge status: Home / Self Care | Payer: Self-pay | Source: Home / Self Care | Attending: Cardiovascular Disease

## 2016-06-28 DIAGNOSIS — N179 Acute kidney failure, unspecified: Secondary | ICD-10-CM

## 2016-06-28 DIAGNOSIS — I5043 Acute on chronic combined systolic (congestive) and diastolic (congestive) heart failure: Secondary | ICD-10-CM

## 2016-06-28 DIAGNOSIS — I25700 Atherosclerosis of coronary artery bypass graft(s), unspecified, with unstable angina pectoris: Secondary | ICD-10-CM

## 2016-06-28 HISTORY — PX: CARDIAC CATHETERIZATION: SHX172

## 2016-06-28 LAB — GLUCOSE, CAPILLARY
GLUCOSE-CAPILLARY: 169 mg/dL — AB (ref 65–99)
Glucose-Capillary: 94 mg/dL (ref 65–99)
Glucose-Capillary: 103 mg/dL — ABNORMAL HIGH (ref 65–99)
Glucose-Capillary: 171 mg/dL — ABNORMAL HIGH (ref 65–99)

## 2016-06-28 LAB — CBC
HCT: 47.5 % (ref 39.0–52.0)
Hemoglobin: 15.5 g/dL (ref 13.0–17.0)
MCH: 28.5 pg (ref 26.0–34.0)
MCHC: 32.6 g/dL (ref 30.0–36.0)
MCV: 87.5 fL (ref 78.0–100.0)
PLATELETS: 157 10*3/uL (ref 150–400)
RBC: 5.43 MIL/uL (ref 4.22–5.81)
RDW: 15.4 % (ref 11.5–15.5)
WBC: 13.9 10*3/uL — AB (ref 4.0–10.5)

## 2016-06-28 LAB — HEPARIN LEVEL (UNFRACTIONATED): HEPARIN UNFRACTIONATED: 0.54 [IU]/mL (ref 0.30–0.70)

## 2016-06-28 LAB — POCT ACTIVATED CLOTTING TIME: ACTIVATED CLOTTING TIME: 109 s

## 2016-06-28 SURGERY — LEFT HEART CATH AND CORS/GRAFTS ANGIOGRAPHY

## 2016-06-28 MED ORDER — FENTANYL CITRATE (PF) 100 MCG/2ML IJ SOLN
INTRAMUSCULAR | Status: DC | PRN
  Administered 2016-06-28 (×2): 25 ug via INTRAVENOUS

## 2016-06-28 MED ORDER — MIDAZOLAM HCL 2 MG/2ML IJ SOLN
INTRAMUSCULAR | Status: AC
  Filled 2016-06-28: qty 2

## 2016-06-28 MED ORDER — SODIUM CHLORIDE 0.9 % IV SOLN
INTRAVENOUS | Status: DC
  Administered 2016-06-28: 07:00:00 via INTRAVENOUS

## 2016-06-28 MED ORDER — FUROSEMIDE 10 MG/ML IJ SOLN
INTRAMUSCULAR | Status: DC | PRN
  Administered 2016-06-28: 40 mg via INTRAVENOUS

## 2016-06-28 MED ORDER — MORPHINE SULFATE (PF) 10 MG/ML IV SOLN
INTRAVENOUS | Status: AC
  Filled 2016-06-28: qty 1

## 2016-06-28 MED ORDER — ASPIRIN 81 MG PO CHEW
81.0000 mg | CHEWABLE_TABLET | ORAL | Status: AC
  Administered 2016-06-28: 81 mg via ORAL
  Filled 2016-06-28: qty 1

## 2016-06-28 MED ORDER — FENTANYL CITRATE (PF) 100 MCG/2ML IJ SOLN
INTRAMUSCULAR | Status: AC
  Filled 2016-06-28: qty 2

## 2016-06-28 MED ORDER — SODIUM CHLORIDE 0.9 % IV SOLN: 250.0000 mL | INTRAVENOUS | Status: DC | PRN

## 2016-06-28 MED ORDER — LIDOCAINE HCL (PF) 1 % IJ SOLN
INTRAMUSCULAR | Status: AC
  Filled 2016-06-28: qty 30

## 2016-06-28 MED ORDER — NITROGLYCERIN IN D5W 200-5 MCG/ML-% IV SOLN
0.0000 ug/min | INTRAVENOUS | Status: DC
  Administered 2016-06-28: 20 ug/min via INTRAVENOUS

## 2016-06-28 MED ORDER — SODIUM CHLORIDE 0.9% FLUSH
3.0000 mL | Freq: Two times a day (BID) | INTRAVENOUS | Status: DC
  Administered 2016-06-29: 3 mL via INTRAVENOUS

## 2016-06-28 MED ORDER — IOPAMIDOL (ISOVUE-370) INJECTION 76%
INTRAVENOUS | Status: DC | PRN
  Administered 2016-06-28: 155 mL via INTRA_ARTERIAL

## 2016-06-28 MED ORDER — HEPARIN (PORCINE) IN NACL 2-0.9 UNIT/ML-% IJ SOLN
INTRAMUSCULAR | Status: DC | PRN
  Administered 2016-06-28: 1000 mL

## 2016-06-28 MED ORDER — SODIUM CHLORIDE 0.9 % IV SOLN
INTRAVENOUS | Status: DC
  Administered 2016-06-29: 22:00:00 via INTRAVENOUS

## 2016-06-28 MED ORDER — NEBIVOLOL HCL 5 MG PO TABS
10.0000 mg | ORAL_TABLET | Freq: Every day | ORAL | Status: DC
  Administered 2016-06-28 – 2016-06-30 (×3): 10 mg via ORAL
  Filled 2016-06-28 (×3): qty 2

## 2016-06-28 MED ORDER — MORPHINE SULFATE (PF) 2 MG/ML IV SOLN
2.0000 mg | INTRAVENOUS | Status: DC | PRN
  Administered 2016-06-29: 2 mg via INTRAVENOUS
  Filled 2016-06-28: qty 1

## 2016-06-28 MED ORDER — HEPARIN (PORCINE) IN NACL 100-0.45 UNIT/ML-% IJ SOLN
1250.0000 [IU]/h | INTRAMUSCULAR | Status: DC
  Administered 2016-06-28 – 2016-06-29 (×2): 1250 [IU]/h via INTRAVENOUS
  Filled 2016-06-28 (×2): qty 250

## 2016-06-28 MED ORDER — NEBIVOLOL HCL 5 MG PO TABS: 10.0000 mg | ORAL_TABLET | Freq: Every day | ORAL | Status: DC

## 2016-06-28 MED ORDER — MORPHINE SULFATE (PF) 4 MG/ML IV SOLN
INTRAVENOUS | Status: DC | PRN
  Administered 2016-06-28: 3 mg via INTRAVENOUS

## 2016-06-28 MED ORDER — SODIUM CHLORIDE 0.9% FLUSH
3.0000 mL | Freq: Two times a day (BID) | INTRAVENOUS | Status: DC
Start: 2016-06-28 — End: 2016-06-30
  Administered 2016-06-28 – 2016-06-30 (×3): 3 mL via INTRAVENOUS

## 2016-06-28 MED ORDER — ONDANSETRON HCL 4 MG/2ML IJ SOLN: 4.0000 mg | Freq: Four times a day (QID) | INTRAMUSCULAR | Status: DC | PRN

## 2016-06-28 MED ORDER — LIDOCAINE HCL (PF) 1 % IJ SOLN
INTRAMUSCULAR | Status: DC | PRN
  Administered 2016-06-28: 10 mL via INTRADERMAL
  Administered 2016-06-28: 17 mL via INTRADERMAL
  Administered 2016-06-28: 19 mL via INTRADERMAL

## 2016-06-28 MED ORDER — ACETAMINOPHEN 325 MG PO TABS: 650.0000 mg | ORAL_TABLET | ORAL | Status: DC | PRN

## 2016-06-28 MED ORDER — SODIUM CHLORIDE 0.9 % IV SOLN
INTRAVENOUS | Status: DC
  Administered 2016-06-29: 06:00:00 via INTRAVENOUS

## 2016-06-28 MED ORDER — SODIUM CHLORIDE 0.9% FLUSH: 3.0000 mL | INTRAVENOUS | Status: DC | PRN

## 2016-06-28 MED ORDER — INSULIN GLARGINE 100 UNIT/ML ~~LOC~~ SOLN
100.0000 [IU] | Freq: Every day | SUBCUTANEOUS | Status: DC
  Administered 2016-06-28 – 2016-06-30 (×2): 100 [IU] via SUBCUTANEOUS
  Filled 2016-06-28 (×3): qty 1

## 2016-06-28 MED ORDER — ATORVASTATIN CALCIUM 80 MG PO TABS
80.0000 mg | ORAL_TABLET | Freq: Every day | ORAL | Status: DC
  Administered 2016-06-28 – 2016-06-29 (×2): 80 mg via ORAL
  Filled 2016-06-28: qty 2
  Filled 2016-06-28: qty 1

## 2016-06-28 MED ORDER — LIRAGLUTIDE 18 MG/3ML ~~LOC~~ SOPN
1.2500 mg | PEN_INJECTOR | Freq: Every day | SUBCUTANEOUS | Status: DC
  Administered 2016-06-28: 1.25 mg via SUBCUTANEOUS

## 2016-06-28 MED ORDER — MIDAZOLAM HCL 2 MG/2ML IJ SOLN
INTRAMUSCULAR | Status: DC | PRN
  Administered 2016-06-28: 1 mg via INTRAVENOUS
  Administered 2016-06-28: 2 mg via INTRAVENOUS

## 2016-06-28 MED ORDER — HEPARIN (PORCINE) IN NACL 2-0.9 UNIT/ML-% IJ SOLN
INTRAMUSCULAR | Status: AC
  Filled 2016-06-28: qty 1000

## 2016-06-28 MED ORDER — FUROSEMIDE 10 MG/ML IJ SOLN
INTRAMUSCULAR | Status: AC
  Filled 2016-06-28: qty 4

## 2016-06-28 MED ORDER — ASPIRIN 81 MG PO CHEW
81.0000 mg | CHEWABLE_TABLET | Freq: Every day | ORAL | Status: DC
  Administered 2016-06-29 – 2016-06-30 (×2): 81 mg via ORAL
  Filled 2016-06-28 (×2): qty 1

## 2016-06-28 MED ORDER — SODIUM CHLORIDE 0.9% FLUSH: 3.0000 mL | Freq: Two times a day (BID) | INTRAVENOUS | Status: DC

## 2016-06-28 SURGICAL SUPPLY — 16 items
CATH INFINITI 5 FR IM (CATHETERS) ×2 IMPLANT
CATH INFINITI 5 FR LCB (CATHETERS) ×2 IMPLANT
CATH INFINITI 5F JL4 125CM (CATHETERS) ×2 IMPLANT
CATH INFINITI 5F PIG 125CM (CATHETERS) ×2 IMPLANT
CATH INFINITI 5FR JR4 125CM (CATHETERS) ×2 IMPLANT
HOVERMATT SINGLE USE (MISCELLANEOUS) ×2 IMPLANT
KIT HEART LEFT (KITS) ×3 IMPLANT
NDL 18GX3-1/2 9CM (NEEDLE) IMPLANT
NEEDLE 18GX3-1/2 9CM (NEEDLE) ×3 IMPLANT
PACK CARDIAC CATHETERIZATION (CUSTOM PROCEDURE TRAY) ×3 IMPLANT
SHEATH PINNACLE 5F 10CM (SHEATH) ×2 IMPLANT
SYR MEDRAD MARK V 150ML (SYRINGE) ×3 IMPLANT
TRANSDUCER W/STOPCOCK (MISCELLANEOUS) ×3 IMPLANT
TUBING CIL FLEX 10 FLL-RA (TUBING) ×3 IMPLANT
WIRE EMERALD 3MM-J .035X150CM (WIRE) ×2 IMPLANT
WIRE EMERALD 3MM-J .035X260CM (WIRE) ×2 IMPLANT

## 2016-06-28 NOTE — Progress Notes (Signed)
   Subjective: Pt denies chest pain/pressure on nitroglycerine drip. Denies SOB, N/V, diaphoresis. He walked 3 laps around the unit yesterday without chest pain or SOB. States he is ready for cath lab this morning   Objective: Vital signs in last 24 hours: Filed Vitals:   06/27/16 2300 06/28/16 0000 06/28/16 0322 06/28/16 0400  BP: 125/64  108/66   Pulse:      Temp: 97.6 F (36.4 C)  97.5 F (36.4 C)   TempSrc: Oral  Oral   Resp: 16 17 11 18   Height:      Weight:      SpO2: 94%  94%    Physical Exam General: Vital signs reviewed.AAOx3. Patient in no acute distress.  Cardiovascular: RRR, Normal S1 & S2, no murmur appreciated. No JVD.  Pulmonary/Chest: Difficult to examine dt anatomy, clear to auscultation bilaterally, no wheezes, rales, or rhonchi. Extremities: No lower extremity edema, pulses 2+ bilaterally. Skin: Warm, dry and intact. No rashes or erythema.  Assessment/Plan: Active Problems:   CAD s/p CABG 1995, graft dependent (LIMA to LAD, SVG to diagonal stented, other grafts occluded)   Chronic systolic CHF (congestive heart failure), NYHA class 2 (HCC)   Morbid obesity (HCC)   DM type 2 causing CKD stage 3 (HCC)   Dyslipidemia - low HDL and high triglycerides   Unstable angina (HCC)   NSTEMI (non-ST elevated myocardial infarction) (HCC)  NSTEMI Troponin 1.09 >0.3>5.87>. Recurrent chest pain with weaning of Nirtroglycerine   - LHC today   - continue Nitro drip, ASA, Nebivolol, Heparin drip   CAD s/p CABG 1995, most recent cath 01/2015 read by Dr. Allyson SabalBerry- long stent SVG to diagonal, 100% occlusion of native vessels, LAD beyond LIMA is diffusely diseased.   - continue Nebivolol, Lisinopril   HTN BP has remained stable    -continue Nebivolol, lisinopril   Chronic systolic CHF   - LVgram today to assess current EF, echo was difficult dt anatomy   - continue Nebivolol and Lisinopril   DM   - ISS   FEN: NS IV ,NPO  DVT PPx: on Heparin drip    LOS: 4 days   Eulah PontNina  Blum, MD 06/28/2016, 7:15 AM Pager: [phone]  I have seen and examined the patient along with Eulah PontNina Blum, MD.  I have reviewed the chart, notes and new data.  I agree with her note.  Key new complaints: some transient chest pain this morning was probably not angina Key examination changes: no overt signs of hypervolemia, but morbid obesity seriously limits his exam Key new findings / data: slight worsening of renal function likely due to diuresis, likely following diuresis (still better than admission creatinine). Elevated WBC, no other signs of infection. Check diff with AM labs.  PLAN: For attempted PCI of SVG-Diagonal today. Restarted IV fluids this AM.  This procedure has been fully reviewed with the patient and written informed consent has been obtained. Plan to resume ACEi depending on renal function after cath.   Thurmon FairMihai Jeiden Daughtridge, MD, [facility]anoFACC [facility] [phone] 06/29/2016, 11:30 AM

## 2016-06-28 NOTE — Progress Notes (Signed)
This RN observed pt's bed was elevated during rounds. This RN went to the bedside, and began lowering the bed. The pt immediately interjected by saying " No I like it this high" This RN educated pt on the safety concerns when beds are not in the lowest position. Pt replied "I have been able to do this all week, so I am going to keep doing it." Pt reminded that his safety was out top priority, but he still insisted on keeping the bed up high.

## 2016-06-28 NOTE — Progress Notes (Signed)
ANTICOAGULATION CONSULT NOTE  Pharmacy Consult for heparin Indication: chest pain/ACS  Allergies  Allergen Reactions  . Coreg [Carvedilol]     nausea   Patient Measurements: Height: 6' 7.5" (201.9 cm) Weight: (!) 373 lb 10.9 oz (169.5 kg) IBW/kg (Calculated) : 94.85 Heparin Dosing Weight: 133kg   Vital Signs: Temp: 97.5 F (36.4 C) (07/10 1028) Temp Source: Oral (07/10 1028) BP: 140/91 mmHg (07/10 1020) Pulse Rate: 60 (07/10 1020)  Labs:  Recent Labs  06/26/16 0401 06/26/16 1327 06/27/16 0623 06/28/16 0319  HGB 15.7  --  15.6 15.5  HCT 47.7  --  48.7 47.5  PLT 158  --  157 157  APTT 120* 104* 76*  --   HEPARINUNFRC 1.04*  --  0.66 0.54    Estimated Creatinine Clearance: 89.7 mL/min (by C-G formula based on Cr of 1.39).  Assessment: Mr. [name] is a [age] yom who presented to the ED with CP. He is on apixaban for history of afib-last dose of apixaban was 7/6  ~1030. Pharmacy is consulted to dose heparin 10h post sheath removal.   Goal of Therapy:  Heparin Level 0.3-0.7 Monitor platelets by anticoagulation protocol: Yes   Plan:  Restart heparin at 1250 units/hr 6h HL Daily heparin level/CBC  Arlean Hoppingorey M. Newman PiesBall, PharmD, BCPS Clinical Pharmacist Pager [phone]  06/28/2016 10:39 AM

## 2016-06-28 NOTE — Progress Notes (Signed)
Site area: left groin Site Prior to Removal:  Level 0 Pressure Applied For:  20 minutes Manual:   yes Patient Status During Pull:  stable Post Pull Site:  Level  0 Post Pull Instructions Giv en:  yes Post Pull Pulses Present: yes Dressing Applied:  tegaderm Bedrest begins @  1010 Comments:

## 2016-06-28 NOTE — Progress Notes (Signed)
Pt's bed remained in a high position. This RN attempted to lower bed again. Pt protested and refused to let RN position bed to the lowest setting. Safety policy and importance re-enforced.

## 2016-06-28 NOTE — H&P (View-Only) (Signed)
PATIENT ID: [id number] with CAD s/p CABG 1995, chronic systolic and diastolic heart failure, morbid obesity, hypertensive heart disease, s/p AICD, and diabetes mellitus type 2 here with NSTEMI.   SUBJECTIVE: Feeling well.  Anxious to see what the cath shows.   PHYSICAL EXAM Filed Vitals:   06/27/16 0900 06/27/16 1000 06/27/16 1100 06/27/16 1145  BP:    101/45  Pulse:      Temp:    97.5 F (36.4 C)  TempSrc:    Oral  Resp: Height:      Weight:      SpO2:    96%   General:  Well-appearing.  No acute distress.  Neck: No JVD Lungs:  CTAB.  No crackles, rhonchi or wheezes. Heart:  Distant heart sounds.  RRR.  No m/r/g.  Normal S1/S2 Abdomen:  Obese, soft, NT, ND.  +BS Extremities:  WWP.  No edema.   LABS: Lab Results  Component Value Date   TROPONINI 5.87* 06/25/2016   Results for orders placed or performed during the hospital encounter of 06/24/16 (from the past 24 hour(s))  APTT     Status: Abnormal   Collection Time: 06/26/16  1:27 PM  Result Value Ref Range   aPTT 104 (H) 24 - 37 seconds  Glucose, capillary     Status: None   Collection Time: 06/26/16  4:23 PM  Result Value Ref Range   Glucose-Capillary 88 65 - 99 mg/dL   Comment 1 Capillary Specimen   Glucose, capillary     Status: Abnormal   Collection Time: 06/26/16  9:18 PM  Result Value Ref Range   Glucose-Capillary 124 (H) 65 - 99 mg/dL   Comment 1 Capillary Specimen   CBC     Status: Abnormal   Collection Time: 06/27/16  6:23 AM  Result Value Ref Range   WBC 12.9 (H) 4.0 - 10.5 K/uL   RBC 5.50 4.22 - 5.81 MIL/uL   Hemoglobin 15.6 13.0 - 17.0 g/dL   HCT 96.0 45.4 - 09.8 %   MCV 88.5 78.0 - 100.0 fL   MCH 28.4 26.0 - 34.0 pg   MCHC 32.0 30.0 - 36.0 g/dL   RDW 11.9 (H) 14.7 - 82.9 %   Platelets 157 150 - 400 K/uL  Heparin level (unfractionated)     Status: None   Collection Time: 06/27/16  6:23 AM  Result Value Ref Range   Heparin Unfractionated 0.66 0.30 - 0.70 IU/mL  APTT     Status:  Abnormal   Collection Time: 06/27/16  6:23 AM  Result Value Ref Range   aPTT 76 (H) 24 - 37 seconds  Glucose, capillary     Status: None   Collection Time: 06/27/16  7:56 AM  Result Value Ref Range   Glucose-Capillary 93 65 - 99 mg/dL   Comment 1 Capillary Specimen   Glucose, capillary     Status: None   Collection Time: 06/27/16 11:45 AM  Result Value Ref Range   Glucose-Capillary 82 65 - 99 mg/dL   Comment 1 Capillary Specimen     Intake/Output Summary (Last 24 hours) at 06/27/16 1305 Last data filed at 06/27/16 1100  Gross per 24 hour  Intake    536 ml  Output   1300 ml  Net   -764 ml    Telemetry:  Sinus rhythm.  Frequent PVCs.  NSVT up to 3 beats. EKG: AP VS 74 bpm.  ST elevation in III with depressions laterally.  PVC.  Unchanged from prior.   ASSESSMENT AND PLAN:  Active Problems:   CAD s/p CABG 1995, graft dependent (LIMA to LAD, SVG to diagonal stented, other grafts occluded)   Chronic systolic CHF (congestive heart failure), NYHA class 2 (HCC)   Morbid obesity (HCC)   DM type 2 causing CKD stage 3 (HCC)   Dyslipidemia - low HDL and high triglycerides   Unstable angina (HCC)   NSTEMI (non-ST elevated myocardial infarction) (HCC)   # NSTEMI: Troponin at least 5.87. He had recurrent chest pain when weaning nitroglycerin.  Cath films reviewed by Dr. Allyson SabalBerry and he has prior long area of stenting in the SVG to diagonal.  100% occlusion of native vessels and the LAD beyond the LIMA is diffusely diseased.  The hope was that he could be medically managed.  However, with recurrent chest pain on nitroglycerin and rising troponin, [facility]  Will ask for LVgram, as his EF cannot be assessed on echo due to body habitus.  Continue aspirin, heparin drip, nebivolol and nitroglycerin drip.   Risks and benefits of cardiac catheterization have been discussed with the patient.  The patient understands that risks included but are not limited to stroke (1 in 1000), death  (1 in 1000), kidney failure [usually temporary] (1 in 500), bleeding (1 in 200), allergic reaction [possibly serious] (1 in 200). The patient understands and agrees to proceed.   # Hypertensive heart disease: BP well-controlled on nebivolol and lisinopril.  # Hyperlipidemia: Continue atorvastatin. LDL 63.  # Chronic systolic and diastolic heart failure: Recent LVEF unknown.  Could not see his endocardium well on echo even with Definity.  Will get LV gram on Monday.  Currently euvolemic. Continue nebivolol and lisinopril.      Brit Wernette C. Duke Salviaandolph, MD, [facility] 06/27/2016 1:05 PM

## 2016-06-28 NOTE — Progress Notes (Signed)
This RN observed pt's wife carrying fast food into pt's room. A burger and fries were placed on pt's bedside table. This RN entered pt's room to remind family about the policy on outside food, and re-inforced education on a heart healthy diet. Pt's wife, immediately said "oh no no this is all for me, he knows he cannot have this." She proceeded to show this RN, a hotdog and said "I brought him this, look it doesn't even have chili on it." Pt and family were given additional education on the importance of a heart healthy diet.

## 2016-06-28 NOTE — Interval H&P Note (Signed)
Cath Lab Visit (complete for each Cath Lab visit)  Clinical Evaluation Leading to the Procedure:   ACS: Yes.    Non-ACS:    Anginal Classification: CCS IV  Anti-ischemic medical therapy: Maximal Therapy (2 or more classes of medications)  Non-Invasive Test Results: No non-invasive testing performed  Prior CABG: Previous CABG      History and Physical Interval Note:  06/28/2016 7:49 AM  [name]  has presented today for surgery, with the diagnosis of cp  The various methods of treatment have been discussed with the patient and family. After consideration of risks, benefits and other options for treatment, the patient has consented to  Procedure(s): Left Heart Cath and Cors/Grafts Angiography (N/A) as a surgical intervention .  The patient's history has been reviewed, patient examined, no change in status, stable for surgery.  I have reviewed the patient's chart and labs.  Questions were answered to the patient's satisfaction.     Nicki Guadalajarahomas Kelly

## 2016-06-28 NOTE — Progress Notes (Signed)
The NT informed this RN, pt began eating the burger and fries brought to him by his wife, once this RN left the room. The NT was at the bedside during this RN's reinforcing heart healthy education. Pt denied eating what wife brought when this RN inquired about the situation.

## 2016-06-28 NOTE — Progress Notes (Signed)
Patient Name: [name] Date of Encounter: 06/28/2016  Active Problems:   CAD s/p CABG 1995, graft dependent (LIMA to LAD, SVG to diagonal stented, other grafts occluded)   Chronic systolic CHF (congestive heart failure), NYHA class 2 (HCC)   Morbid obesity (HCC)   DM type 2 causing CKD stage 3 (HCC)   Dyslipidemia - low HDL and high triglycerides   Unstable angina (HCC)   NSTEMI (non-ST elevated myocardial infarction) (HCC)   Length of Stay: 4  SUBJECTIVE  Stenosis identified in SVG-Dx, but he was very uncomfortable on the cath table and the PCI was deferred. LVEDP 37. Received diuretics and started on IV NTG.  Currently asymptomatic at rest.  No groin problems.  CURRENT MEDS . allopurinol  100 mg Oral Daily  . aspirin  81 mg Oral Daily  . atorvastatin  80 mg Oral q1800  . canagliflozin  100 mg Oral Daily  . cholecalciferol  1,000 Units Oral QODAY  . furosemide  40 mg Oral Daily  . insulin aspart  0-15 Units Subcutaneous TID WC  . insulin aspart  32 Units Subcutaneous QPC breakfast  . insulin glargine  100 Units Subcutaneous Daily  . Liraglutide  1.25 mg Subcutaneous Daily  . lisinopril  10 mg Oral Daily  . nebivolol  10 mg Oral Daily  . sodium chloride flush  3 mL Intravenous Q12H  . spironolactone  25 mg Oral Daily    OBJECTIVE   Intake/Output Summary (Last 24 hours) at 06/28/16 1335 Last data filed at 06/28/16 1300  Gross per 24 hour  Intake   1004 ml  Output   2600 ml  Net  -1596 ml   Filed Weights   06/24/16 1508 06/25/16 0400  Weight: 169.4 kg (373 lb 7.4 oz) 169.5 kg (373 lb 10.9 oz)    PHYSICAL EXAM Filed Vitals:   06/28/16 1233 06/28/16 1245 06/28/16 1300 06/28/16 1315  BP: 104/76 99/76  112/83  Pulse: 60 65 69 64  Temp: 98.5 F (36.9 C)     TempSrc: Oral     Resp: Height:      Weight:      SpO2: 94% 93% 93% 95%   General: Alert, oriented x3, no distress, morbidly obese Head: no evidence of trauma, PERRL, EOMI, no  exophtalmos or lid lag, no myxedema, no xanthelasma; normal ears, nose and oropharynx Neck: hard to see venous pulsations and hepatojugular reflux; brisk carotid pulses without delay and no carotid bruits Chest: clear to auscultation, no signs of consolidation by percussion or palpation, normal fremitus, symmetrical and full respiratory excursions Cardiovascular: unable to locate the apical impulse, regular rhythm, normal first and second heart sounds, no rubs or gallops, no murmur Abdomen: no tenderness or distention, no masses by palpation, no abnormal pulsatility or arterial bruits, normal bowel sounds, no hepatosplenomegaly Extremities: no clubbing, cyanosis or edema; 2+ radial, ulnar and brachial pulses bilaterally; 2+ right femoral, posterior tibial and dorsalis pedis pulses; 2+ left femoral, posterior tibial and dorsalis pedis pulses; no subclavian or femoral bruits Neurological: grossly nonfocal  LABS  CBC  Recent Labs  06/27/16 0623 06/28/16 0319  WBC 12.9* 13.9*  HGB 15.6 15.5  HCT 48.7 47.5  MCV 88.5 87.5  PLT 157 157    Radiology Studies Imaging results have been reviewed and No results found.  TELE NSR with occasional PVCs   ASSESSMENT AND PLAN  1. CAD s/p acute NSTEMI - severe native CAD, graft dependent. Patent LIMA to  diffusely disease LAD and lesion (restenosis?) downstream of stent in SVG-Diag placed about 18 months ago. For staged PCI in AM if renal function allows.  2. Acute on chronic systolic and diastolic HF - LVEF around 30% by angio with multiple regional abnormalities. LVEDP 37, received diuretics and feels well. Hold lisinopril and aldactone until repeat cath/PCI, then resume. Keep on IV NTG overnight.  3. Acute on chronic renal failure - creatinine improving since admission, will reassess in AM.  4. HLP - LDL< 70 on statin.  5. HTN - continue bystolic, resume RAAS inhibitors after cath.  6. S/P AICD   Thurmon FairMihai Cambria Osten, MD, [facility] [phone] office [phone] pager 06/28/2016 1:35 PM

## 2016-06-29 ENCOUNTER — Encounter (HOSPITAL_COMMUNITY)
Admission: EM | Disposition: A | Discharge status: Home / Self Care | Payer: Self-pay | Source: Home / Self Care | Attending: Cardiovascular Disease

## 2016-06-29 ENCOUNTER — Encounter (HOSPITAL_COMMUNITY): Payer: Self-pay | Admitting: Cardiovascular Disease

## 2016-06-29 DIAGNOSIS — N289 Disorder of kidney and ureter, unspecified: Secondary | ICD-10-CM

## 2016-06-29 DIAGNOSIS — N183 Chronic kidney disease, stage 3 unspecified: Secondary | ICD-10-CM | POA: Insufficient documentation

## 2016-06-29 DIAGNOSIS — I25710 Atherosclerosis of autologous vein coronary artery bypass graft(s) with unstable angina pectoris: Secondary | ICD-10-CM

## 2016-06-29 DIAGNOSIS — N189 Chronic kidney disease, unspecified: Secondary | ICD-10-CM

## 2016-06-29 HISTORY — PX: CARDIAC CATHETERIZATION: SHX172

## 2016-06-29 LAB — BASIC METABOLIC PANEL
Anion gap: 8 (ref 5–15)
BUN: 20 mg/dL (ref 6–20)
CALCIUM: 9.2 mg/dL (ref 8.9–10.3)
CO2: 27 mmol/L (ref 22–32)
CREATININE: 1.57 mg/dL — AB (ref 0.61–1.24)
Chloride: 101 mmol/L (ref 101–111)
GFR, EST AFRICAN AMERICAN: 51 mL/min — AB (ref >60)
GFR, EST NON AFRICAN AMERICAN: 44 mL/min — AB (ref >60)
Glucose, Bld: 93 mg/dL (ref 65–99)
Potassium: 4.5 mmol/L (ref 3.5–5.1)
SODIUM: 136 mmol/L (ref 135–145)

## 2016-06-29 LAB — CBC
HCT: 48 % (ref 39.0–52.0)
Hemoglobin: 15.5 g/dL (ref 13.0–17.0)
MCH: 28.3 pg (ref 26.0–34.0)
MCHC: 32.3 g/dL (ref 30.0–36.0)
MCV: 87.8 fL (ref 78.0–100.0)
Platelets: 164 10*3/uL (ref 150–400)
RBC: 5.47 MIL/uL (ref 4.22–5.81)
RDW: 15.3 % (ref 11.5–15.5)
WBC: 14.3 10*3/uL — ABNORMAL HIGH (ref 4.0–10.5)

## 2016-06-29 LAB — GLUCOSE, CAPILLARY
Glucose-Capillary: 101 mg/dL — ABNORMAL HIGH (ref 65–99)
Glucose-Capillary: 73 mg/dL (ref 65–99)
Glucose-Capillary: 99 mg/dL (ref 65–99)
Glucose-Capillary: 189 mg/dL — ABNORMAL HIGH (ref 65–99)

## 2016-06-29 LAB — HEPARIN LEVEL (UNFRACTIONATED): HEPARIN UNFRACTIONATED: 0.4 [IU]/mL (ref 0.30–0.70)

## 2016-06-29 LAB — POCT ACTIVATED CLOTTING TIME: Activated Clotting Time: 560 seconds

## 2016-06-29 SURGERY — CORONARY STENT INTERVENTION

## 2016-06-29 MED ORDER — SODIUM CHLORIDE 0.9 % IV SOLN
INTRAVENOUS | Status: DC
  Administered 2016-06-29: 12:00:00 via INTRAVENOUS

## 2016-06-29 MED ORDER — ASPIRIN 81 MG PO CHEW
81.0000 mg | CHEWABLE_TABLET | Freq: Every day | ORAL | Status: DC
Start: 2016-06-29 — End: 2016-06-29

## 2016-06-29 MED ORDER — ONDANSETRON HCL 4 MG/2ML IJ SOLN: 4.0000 mg | Freq: Four times a day (QID) | INTRAMUSCULAR | Status: DC | PRN

## 2016-06-29 MED ORDER — CLOPIDOGREL BISULFATE 300 MG PO TABS
ORAL_TABLET | ORAL | Status: AC
  Filled 2016-06-29: qty 1

## 2016-06-29 MED ORDER — IOPAMIDOL (ISOVUE-370) INJECTION 76%
INTRAVENOUS | Status: DC | PRN
  Administered 2016-06-29: 145 mL via INTRA_ARTERIAL

## 2016-06-29 MED ORDER — SODIUM CHLORIDE 0.9 % IV SOLN: INTRAVENOUS | Status: DC

## 2016-06-29 MED ORDER — ENOXAPARIN SODIUM 40 MG/0.4ML ~~LOC~~ SOLN
40.0000 mg | SUBCUTANEOUS | Status: DC
  Administered 2016-06-30: 40 mg via SUBCUTANEOUS
  Filled 2016-06-29: qty 0.4

## 2016-06-29 MED ORDER — ACETAMINOPHEN 325 MG PO TABS: 650.0000 mg | ORAL_TABLET | ORAL | Status: DC | PRN

## 2016-06-29 MED ORDER — SODIUM CHLORIDE 0.9 % IV SOLN
250.0000 mg | INTRAVENOUS | Status: DC | PRN
  Administered 2016-06-29 (×2): 1.75 mg/kg/h via INTRAVENOUS

## 2016-06-29 MED ORDER — CLOPIDOGREL BISULFATE 300 MG PO TABS
ORAL_TABLET | ORAL | Status: DC | PRN
  Administered 2016-06-29: 600 mg via ORAL

## 2016-06-29 MED ORDER — SODIUM CHLORIDE 0.9% FLUSH
3.0000 mL | Freq: Two times a day (BID) | INTRAVENOUS | Status: DC
  Administered 2016-06-29 – 2016-06-30 (×2): 3 mL via INTRAVENOUS

## 2016-06-29 MED ORDER — HEPARIN (PORCINE) IN NACL 2-0.9 UNIT/ML-% IJ SOLN
INTRAMUSCULAR | Status: DC | PRN
  Administered 2016-06-29: 1500 mL

## 2016-06-29 MED ORDER — MIDAZOLAM HCL 2 MG/2ML IJ SOLN
INTRAMUSCULAR | Status: DC | PRN
  Administered 2016-06-29: 1 mg via INTRAVENOUS
  Administered 2016-06-29: 2 mg via INTRAVENOUS
  Administered 2016-06-29: 1 mg via INTRAVENOUS

## 2016-06-29 MED ORDER — SODIUM CHLORIDE 0.9% FLUSH: 3.0000 mL | INTRAVENOUS | Status: DC | PRN

## 2016-06-29 MED ORDER — SODIUM CHLORIDE 0.9 % IV SOLN
250.0000 mL | INTRAVENOUS | Status: DC | PRN
Start: 2016-06-29 — End: 2016-06-30

## 2016-06-29 MED ORDER — BIVALIRUDIN BOLUS VIA INFUSION - CUPID
INTRAVENOUS | Status: DC | PRN
  Administered 2016-06-29: 127.125 mg via INTRAVENOUS

## 2016-06-29 MED ORDER — INSULIN ASPART 100 UNIT/ML ~~LOC~~ SOLN: 0.0000 [IU] | Freq: Three times a day (TID) | SUBCUTANEOUS | Status: DC

## 2016-06-29 MED ORDER — ATROPINE SULFATE 1 MG/10ML IJ SOSY
PREFILLED_SYRINGE | INTRAMUSCULAR | Status: AC
  Filled 2016-06-29: qty 10

## 2016-06-29 MED ORDER — LIDOCAINE HCL (PF) 1 % IJ SOLN
INTRAMUSCULAR | Status: DC | PRN
  Administered 2016-06-29: 20 mL

## 2016-06-29 MED ORDER — BIVALIRUDIN 250 MG IV SOLR
INTRAVENOUS | Status: AC
  Filled 2016-06-29: qty 250

## 2016-06-29 MED ORDER — HEPARIN (PORCINE) IN NACL 2-0.9 UNIT/ML-% IJ SOLN
INTRAMUSCULAR | Status: AC
  Filled 2016-06-29: qty 1000

## 2016-06-29 MED ORDER — FENTANYL CITRATE (PF) 100 MCG/2ML IJ SOLN
INTRAMUSCULAR | Status: DC | PRN
  Administered 2016-06-29 (×2): 25 ug via INTRAVENOUS
  Administered 2016-06-29: 50 ug via INTRAVENOUS
  Administered 2016-06-29: 25 ug via INTRAVENOUS

## 2016-06-29 MED ORDER — DEXTROSE-NACL 5-0.9 % IV SOLN
INTRAVENOUS | Status: DC
  Administered 2016-06-29: 13:00:00 via INTRAVENOUS

## 2016-06-29 MED ORDER — NITROGLYCERIN 1 MG/10 ML FOR IR/CATH LAB
INTRA_ARTERIAL | Status: DC | PRN
  Administered 2016-06-29 (×3): 200 ug via INTRACORONARY

## 2016-06-29 MED ORDER — INSULIN ASPART 100 UNIT/ML ~~LOC~~ SOLN
0.0000 [IU] | Freq: Three times a day (TID) | SUBCUTANEOUS | Status: DC
  Administered 2016-06-29: 3 [IU] via SUBCUTANEOUS

## 2016-06-29 MED ORDER — FENTANYL CITRATE (PF) 100 MCG/2ML IJ SOLN
INTRAMUSCULAR | Status: AC
  Filled 2016-06-29: qty 2

## 2016-06-29 MED ORDER — CLOPIDOGREL BISULFATE 75 MG PO TABS
75.0000 mg | ORAL_TABLET | Freq: Every day | ORAL | Status: DC
  Administered 2016-06-30: 75 mg via ORAL
  Filled 2016-06-29: qty 1

## 2016-06-29 MED ORDER — NITROGLYCERIN 1 MG/10 ML FOR IR/CATH LAB
INTRA_ARTERIAL | Status: AC
  Filled 2016-06-29: qty 10

## 2016-06-29 MED ORDER — MIDAZOLAM HCL 2 MG/2ML IJ SOLN
INTRAMUSCULAR | Status: AC
  Filled 2016-06-29: qty 2

## 2016-06-29 MED ORDER — CLOPIDOGREL BISULFATE 300 MG PO TABS
ORAL_TABLET | ORAL | Status: AC
Start: 2016-06-29 — End: 2016-06-29
  Filled 2016-06-29: qty 1

## 2016-06-29 MED ORDER — LIDOCAINE HCL (PF) 1 % IJ SOLN
INTRAMUSCULAR | Status: AC
  Filled 2016-06-29: qty 30

## 2016-06-29 MED FILL — Morphine Sulfate IV Soln PF 10 MG/ML: INTRAVENOUS | Qty: 1 | Status: AC

## 2016-06-29 SURGICAL SUPPLY — 18 items
BALLN ANGIOSCULPT RX 2.5X10 (BALLOONS) ×3
BALLN EMERGE MR 2.0X12 (BALLOONS) ×3
BALLN ~~LOC~~ EMERGE MR 2.5X12 (BALLOONS) ×3
BALLOON ANGIOSCULPT RX 2.5X10 (BALLOONS) IMPLANT
BALLOON EMERGE MR 2.0X12 (BALLOONS) IMPLANT
BALLOON ~~LOC~~ EMERGE MR 2.5X12 (BALLOONS) IMPLANT
CATH VISTA GUIDE 6FR AL1 (CATHETERS) ×2 IMPLANT
HOVERMATT SINGLE USE (MISCELLANEOUS) ×2 IMPLANT
KIT ENCORE 26 ADVANTAGE (KITS) ×2 IMPLANT
KIT HEART LEFT (KITS) ×3 IMPLANT
PACK CARDIAC CATHETERIZATION (CUSTOM PROCEDURE TRAY) ×3 IMPLANT
SHEATH PINNACLE 6F 10CM (SHEATH) ×2 IMPLANT
STENT SYNERGY DES 2.25X12 (Permanent Stent) ×2 IMPLANT
TRANSDUCER W/STOPCOCK (MISCELLANEOUS) ×3 IMPLANT
TUBING CIL FLEX 10 FLL-RA (TUBING) ×3 IMPLANT
WIRE EMERALD 3MM-J .035X150CM (WIRE) ×2 IMPLANT
WIRE LUGE 182CM (WIRE) ×2 IMPLANT
WIRE PT2 MS 185 (WIRE) ×2 IMPLANT

## 2016-06-29 NOTE — Progress Notes (Signed)
ANTICOAGULATION CONSULT NOTE  Pharmacy Consult for heparin Indication: chest pain/ACS  Allergies  Allergen Reactions  . Coreg [Carvedilol]     nausea   Patient Measurements: Height: 6' 7.5" (201.9 cm) Weight: (!) 373 lb 10.9 oz (169.5 kg) IBW/kg (Calculated) : 94.85 Heparin Dosing Weight: 133kg   Vital Signs: Temp: 97.8 F (36.6 C) (07/11 0858) Temp Source: Oral (07/11 0858) BP: 100/60 mmHg (07/11 0858) Pulse Rate: 62 (07/11 0858)  Labs:  Recent Labs  06/26/16 1327  06/27/16 0623 06/28/16 0319 06/29/16 0342  HGB  --   < > 15.6 15.5 15.5  HCT  --   --  48.7 47.5 48.0  PLT  --   --  157 157 164  APTT 104*  --  76*  --   --   HEPARINUNFRC  --   --  0.66 0.54 0.40  CREATININE  --   --   --   --  1.57*  < > = values in this interval not displayed.  Estimated Creatinine Clearance: 79.4 mL/min (by C-G formula based on Cr of 1.57).  Assessment: Mr. Jethro Polingorwood is a [age] yom who presented to the ED with CP. He is on apixaban for history of afib-last dose of apixaban was 7/6  ~1030. Pharmacy is consulted to dose heparin 10h post sheath removal.   This morning's HL is therapeutic at 0.4 on heparin 1250 units/hr. CBC wnl. No issues with infusion or bleeding noted.  Goal of Therapy:  Heparin Level 0.3-0.7 Monitor platelets by anticoagulation protocol: Yes   Plan:  Continue heparin 1250 units/hr Daily heparin level/CBC Monitor s/sx of bleeding  Arlean Hoppingorey M. Newman PiesBall, PharmD, BCPS Clinical Pharmacist Pager [phone]  06/29/2016 10:01 AM

## 2016-06-29 NOTE — Progress Notes (Signed)
Left femoral sheath pulled at 1952. Morphine given prior to pull. 2 RN's present, atropine at bedside, VSS throughout, pressure held for 24 minutes, site level 0. RN gave education, pt verbalized understanding of post sheath removal precautions.

## 2016-06-29 NOTE — H&P (View-Only) (Signed)
Patient Name: [name] Date of Encounter: 06/28/2016  Active Problems:   CAD s/p CABG 1995, graft dependent (LIMA to LAD, SVG to diagonal stented, other grafts occluded)   Chronic systolic CHF (congestive heart failure), NYHA class 2 (HCC)   Morbid obesity (HCC)   DM type 2 causing CKD stage 3 (HCC)   Dyslipidemia - low HDL and high triglycerides   Unstable angina (HCC)   NSTEMI (non-ST elevated myocardial infarction) (HCC)   Length of Stay: 4  SUBJECTIVE  Stenosis identified in SVG-Dx, but he was very uncomfortable on the cath table and the PCI was deferred. LVEDP 37. Received diuretics and started on IV NTG.  Currently asymptomatic at rest.  No groin problems.  CURRENT MEDS . allopurinol  100 mg Oral Daily  . aspirin  81 mg Oral Daily  . atorvastatin  80 mg Oral q1800  . canagliflozin  100 mg Oral Daily  . cholecalciferol  1,000 Units Oral QODAY  . furosemide  40 mg Oral Daily  . insulin aspart  0-15 Units Subcutaneous TID WC  . insulin aspart  32 Units Subcutaneous QPC breakfast  . insulin glargine  100 Units Subcutaneous Daily  . Liraglutide  1.25 mg Subcutaneous Daily  . lisinopril  10 mg Oral Daily  . nebivolol  10 mg Oral Daily  . sodium chloride flush  3 mL Intravenous Q12H  . spironolactone  25 mg Oral Daily    OBJECTIVE   Intake/Output Summary (Last 24 hours) at 06/28/16 1335 Last data filed at 06/28/16 1300  Gross per 24 hour  Intake   1004 ml  Output   2600 ml  Net  -1596 ml   Filed Weights   06/24/16 1508 06/25/16 0400  Weight: 169.4 kg (373 lb 7.4 oz) 169.5 kg (373 lb 10.9 oz)    PHYSICAL EXAM Filed Vitals:   06/28/16 1233 06/28/16 1245 06/28/16 1300 06/28/16 1315  BP: 104/76 99/76  112/83  Pulse: 60 65 69 64  Temp: 98.5 F (36.9 C)     TempSrc: Oral     Resp: 30 23 13 14  Height:      Weight:      SpO2: 94% 93% 93% 95%   General: Alert, oriented x3, no distress, morbidly obese Head: no evidence of trauma, PERRL, EOMI, no  exophtalmos or lid lag, no myxedema, no xanthelasma; normal ears, nose and oropharynx Neck: hard to see venous pulsations and hepatojugular reflux; brisk carotid pulses without delay and no carotid bruits Chest: clear to auscultation, no signs of consolidation by percussion or palpation, normal fremitus, symmetrical and full respiratory excursions Cardiovascular: unable to locate the apical impulse, regular rhythm, normal first and second heart sounds, no rubs or gallops, no murmur Abdomen: no tenderness or distention, no masses by palpation, no abnormal pulsatility or arterial bruits, normal bowel sounds, no hepatosplenomegaly Extremities: no clubbing, cyanosis or edema; 2+ radial, ulnar and brachial pulses bilaterally; 2+ right femoral, posterior tibial and dorsalis pedis pulses; 2+ left femoral, posterior tibial and dorsalis pedis pulses; no subclavian or femoral bruits Neurological: grossly nonfocal  LABS  CBC  Recent Labs  06/27/16 0623 06/28/16 0319  WBC 12.9* 13.9*  HGB 15.6 15.5  HCT 48.7 47.5  MCV 88.5 87.5  PLT 157 157    Radiology Studies Imaging results have been reviewed and No results found.  TELE NSR with occasional PVCs   ASSESSMENT AND PLAN  1. CAD s/p acute NSTEMI - severe native CAD, graft dependent. Patent LIMA to   diffusely disease LAD and lesion (restenosis?) downstream of stent in SVG-Diag placed about 18 months ago. For staged PCI in AM if renal function allows.  2. Acute on chronic systolic and diastolic HF - LVEF around 30% by angio with multiple regional abnormalities. LVEDP 37, received diuretics and feels well. Hold lisinopril and aldactone until repeat cath/PCI, then resume. Keep on IV NTG overnight.  3. Acute on chronic renal failure - creatinine improving since admission, will reassess in AM.  4. HLP - LDL< 70 on statin.  5. HTN - continue bystolic, resume RAAS inhibitors after cath.  6. S/P AICD   Thurmon FairMihai Raquon Milledge, MD, [facility] [phone] office [phone] pager 06/28/2016 1:35 PM

## 2016-06-29 NOTE — Progress Notes (Signed)
Two EKG's done and placed in the chart. Informed Dr. Royann Shiversroitoru of results. Pt denies having chest pain at this time. No new orders given.

## 2016-06-29 NOTE — Progress Notes (Signed)
EKG CRITICAL VALUE     12 lead EKG performed.  Critical value noted.  Debbe OdeaLatisha, RN notified.   Wandalee FerdinandKimberly Logan, CCT 06/29/2016 7:24 AM

## 2016-06-29 NOTE — Interval H&P Note (Signed)
Cath Lab Visit (complete for each Cath Lab visit)  Clinical Evaluation Leading to the Procedure:   ACS: Yes.    Non-ACS:    Anginal Classification: CCS IV  Anti-ischemic medical therapy: Maximal Therapy (2 or more classes of medications)  Non-Invasive Test Results: No non-invasive testing performed  Prior CABG: Previous CABG      History and Physical Interval Note:  06/29/2016 3:15 PM  [name]  has presented today for surgery, with the diagnosis of cad  The various methods of treatment have been discussed with the patient and family. After consideration of risks, benefits and other options for treatment, the patient has consented to  Procedure(s): Coronary Stent Intervention (N/A) as a surgical intervention .  The patient's history has been reviewed, patient examined, no change in status, stable for surgery.  I have reviewed the patient's chart and labs.  Questions were answered to the patient's satisfaction.     Nicki Guadalajarahomas Rayvon Dakin

## 2016-06-29 NOTE — Progress Notes (Signed)
CBG-73mg /dl,  Asymptomatic, Md aware, ivf changed to D5ns at 100 ml/hr. Continue to monitor.

## 2016-06-29 NOTE — Progress Notes (Signed)
Back from the cath. Lab by bed awake and alert. Right groin sheath in placed. instructed to be on bed rest and avoid bending right leg. Continue to monitor. to monitor.

## 2016-06-29 NOTE — Progress Notes (Signed)
   Subjective: Pt states he had more chest pain this morning which resolved with an increase in his nitroglycerine drip. He was able to walk 3 times around the unit yesterday. Currently he denies CP, SOB, N/V. Denies pain at femoral cath site. He states he is ready to go back to the cath lab.   Objective: Vital signs in last 24 hours: Filed Vitals:   06/28/16 2300 06/29/16 0345 06/29/16 0734 06/29/16 0858  BP: 95/56 113/53 100/60 100/60  Pulse: 60 62 62 62  Temp: 97.6 F (36.4 C) 97.4 F (36.3 C) 97.8 F (36.6 C) 97.8 F (36.6 C)  TempSrc: Oral Oral Oral Oral  Resp: 21 11 19 19   Height:      Weight:      SpO2: 93% 95%  96%   Physical Exam General: Vital signs reviewed. Patient in no acute distress. Sitting up in bed sleeping  Cardiovascular: RRR, normal S1 and S2, no murmur, no JVD  Pulmonary/Chest: Clear to auscultation bilaterally, no wheezes, rales, or rhonchi. Abdominal: Soft, non-tender, non-distended, BS + Extremities: No lower extremity edema, pulses symmetric and intact bilaterally. Femoral cath bandage clean dry and intact.  Skin: Warm, dry and intact. No rashes or erythema.  Assessment/Plan: Active Problems:   CAD s/p CABG 1995, graft dependent (LIMA to LAD, SVG to diagonal stented, other grafts occluded)   Chronic systolic CHF (congestive heart failure), NYHA class 2 (HCC)   Morbid obesity (HCC)   DM type 2 causing CKD stage 3 (HCC)   Dyslipidemia - low HDL and high triglycerides   Unstable angina (HCC)   NSTEMI (non-ST elevated myocardial infarction) (HCC)  NSTEMI cath yesterday showed occlusion of LAD past LIMA, severe native vessel disease    - cath lab today for PCI  - continue nitroglycerin drip, ASA   Chronic systolic CHF LVEF 15%, LVEDP 37   -Continue nebivolol and lasix   - hold lisinopril and aldactone for repeat cath/PCI, then resume   HTN   -Continue nebivolol   HLD   - continue atorvastatin   CKD renal function stable BUN 20, sCr 1.57, post  cath sCr is up from 1.39 yesterday    [facility]   Chronic leukocytosis   - will check CBC with diff tomorrow   DM   - continue ISS   FEN IV NS 17500ml/hr, NPO   DVT PPx Heparin Drip 15.5 ml/hr    LOS: 5 days   Eulah PontNina Koltin Wehmeyer, MD 06/29/2016, 9:42 AM Pager: [phone]

## 2016-06-30 ENCOUNTER — Telehealth: Payer: Self-pay | Admitting: Physician Assistant

## 2016-06-30 ENCOUNTER — Encounter (HOSPITAL_COMMUNITY): Payer: Self-pay | Admitting: Cardiovascular Disease

## 2016-06-30 ENCOUNTER — Other Ambulatory Visit: Payer: Self-pay | Admitting: Cardiovascular Disease

## 2016-06-30 DIAGNOSIS — Z9111 Patient's noncompliance with dietary regimen: Secondary | ICD-10-CM

## 2016-06-30 DIAGNOSIS — Z91119 Patient's noncompliance with dietary regimen due to unspecified reason: Secondary | ICD-10-CM

## 2016-06-30 DIAGNOSIS — I255 Ischemic cardiomyopathy: Secondary | ICD-10-CM

## 2016-06-30 LAB — BASIC METABOLIC PANEL
ANION GAP: 8 (ref 5–15)
BUN: 19 mg/dL (ref 6–20)
CHLORIDE: 103 mmol/L (ref 101–111)
CO2: 24 mmol/L (ref 22–32)
CREATININE: 1.54 mg/dL — AB (ref 0.61–1.24)
Calcium: 8.6 mg/dL — ABNORMAL LOW (ref 8.9–10.3)
GFR calc non Af Amer: 45 mL/min — ABNORMAL LOW (ref >60)
GFR, EST AFRICAN AMERICAN: 52 mL/min — AB (ref >60)
GLUCOSE: 133 mg/dL — AB (ref 65–99)
Potassium: 4.2 mmol/L (ref 3.5–5.1)
Sodium: 135 mmol/L (ref 135–145)

## 2016-06-30 LAB — CBC
HCT: 44.5 % (ref 39.0–52.0)
HEMOGLOBIN: 14.1 g/dL (ref 13.0–17.0)
MCH: 28.1 pg (ref 26.0–34.0)
MCHC: 31.7 g/dL (ref 30.0–36.0)
MCV: 88.6 fL (ref 78.0–100.0)
Platelets: 159 10*3/uL (ref 150–400)
RBC: 5.02 MIL/uL (ref 4.22–5.81)
RDW: 15.5 % (ref 11.5–15.5)
WBC: 13.9 10*3/uL — AB (ref 4.0–10.5)

## 2016-06-30 LAB — DIFFERENTIAL
BASOS ABS: 0 10*3/uL (ref 0.0–0.1)
Basophils Relative: 0 %
Eosinophils Absolute: 0.1 10*3/uL (ref 0.0–0.7)
Eosinophils Relative: 1 %
LYMPHS ABS: 3.3 10*3/uL (ref 0.7–4.0)
LYMPHS PCT: 24 %
Monocytes Absolute: 0.8 10*3/uL (ref 0.1–1.0)
Monocytes Relative: 6 %
NEUTROS ABS: 9.5 10*3/uL — AB (ref 1.7–7.7)
NEUTROS PCT: 69 %

## 2016-06-30 LAB — GLUCOSE, CAPILLARY
Glucose-Capillary: 98 mg/dL (ref 65–99)
Glucose-Capillary: 73 mg/dL (ref 65–99)

## 2016-06-30 MED ORDER — ASPIRIN 81 MG PO TABS: 81.0000 mg | ORAL_TABLET | Freq: Every day | ORAL | Status: DC

## 2016-06-30 MED ORDER — CLOPIDOGREL BISULFATE 75 MG PO TABS: 75.0000 mg | ORAL_TABLET | Freq: Every day | ORAL | Status: DC

## 2016-06-30 NOTE — Discharge Summary (Addendum)
Discharge Summary    Patient ID: [name],  MRN: [id number], DOB/AGE: Mar 07, 1946 70 y.o.  Admit date: 06/24/2016 Discharge date: 06/30/2016  Primary Care Provider: Devra Dopp Primary Cardiologist: Dr. Royann Shivers  Discharge Diagnoses    Principal Problem:   NSTEMI (non-ST elevated myocardial infarction) [facility]) Active Problems:   CAD s/p CABG 1995, graft dependent (LIMA to LAD, SVG to diagonal stented, other grafts occluded)   Acute on chronic systolic CHF (congestive heart failure) (HCC)   Morbid obesity (HCC)   DM type 2 causing CKD stage 3 (HCC)   Dyslipidemia - low HDL and high triglycerides   Essential hypertension   CKD (chronic kidney disease), stage III   Dietary noncompliance   Ischemic cardiomyopathy    Diagnostic Studies/Procedures    1. Cardiac catheterization this admission, please see full report and below for summary.  _____________   History of Present Illness & Hospital Course    [name] is a PMH of CAD (CABG 1995 and multiple stents), chronic systolic CHF, ICM s/p ICD, pAF, HTN, CKD stage 3,HLD, DM, morbid obesity, gout, dyslipidemia who presented to [facility] with chest pain radiating to his jaw and arm associated with diaphoresis, nausea and SOB. His EKG changes were felt likely r/t chronic CAD btu symptoms were concerning for [place] thus he was admitted and placed on IV heparin. He did rule in for a NSTEMI with peak troponin of 5.87. He continued to have intermittent chest pain and thus was placed on IV NTG. His prior films were reviewed by interventionalist teams who noted he has h/o no good percutaneous options by last cath 01/2015. However, given his ongoing CP and + enzymes, he ultimately underwent LHC 06/28/16 which showed severe native CAD with total occ of LAD at ostium, total occ of prox LCx, total off of prox RCA, + bridging collaterals supplying the distal portion of the RCA as well as collaterals from a conus like branch supplying the mid  circumflex, old occ of VG supplied by the Cx and the VG which had supplied the RCA, patent LIMA supplying the mid LAD but with subtotal diffuse distal LAD stenosis beyond the LIMA insertion, and patent VG supplying the diagonal with the previously placed stent that is patent but with evidence for a distal 80% eccentric stenosis beyond the stented segment immediately proximal to the anastomosis. LVEF 10-15% (prior EF in 2016 not really known since echo was technically difficult even with definity). LVEDP was 37 -- he received diuretics and was placed back on IV NTG. He was not felt to be a candidate for redo surgical revascularization.Angiograms were reviewed with colleagues with regard to attempt at opening the 80% distal SVG to diagonal stenosis. He was brought back to the lab 06/29/16 and ultimately had successful percutaneous coronary intervention involving an 85% distal SVG stenosis beyond a previously placed 2.538 mm Promus premier stent treated with PTCA/ DES stenting requiring a buddy wire technique with insertion of a 2.2512 mm Synergy DES stent dilated to 2.38 mm. He tolerated this procedure well. Renal function has remained relatively stable. His lisinopril and spironolactone had been held for cath - today his lisinopril will be resumed. Cleda Daub will continue to be held pending observation of his BP - it was as low as 93/48 early this AM, coming up to 126/90 pre-walk and 147/99 post-walk with cardiac rehab, and now has settled down to 109/90. The patient feels well today. Dr. Alla German has seen and examined the patient today and feels he is  stable for discharge. He is net -3.9L. He will be weighed prior to d/c to record a discharge weight.   Per Dr. Royann Shiversroitoru, the plan is to continue triple anticoag therapy for 1 month then drop aspirin but continue Plavix + Eliquis. This was personally reviewed with the patient along with bleeding precautions.  Issues for f/u: - There is concern for long-term  compliance as the patient was observed eating outside foods during his stay brought in by family (bacon, eggs, hashbrowns, hot dogs, burgers, fries), threatening to leave AMA when meal tray did not arrive, and electing to keep bed at raised height despite education regarding fall risk. He was only moderately receptive to education by cardiac rehab. - His WBC appears perpetually elevated without obvious sign of infection. He was advised to f/u PCP for this.   Consultants: N/A  _____________  Discharge Vitals Blood pressure 109/90, pulse 64, temperature 97.8 F (36.6 C), temperature source Oral, resp. rate 22, height 6' 7.5" (2.019 m), weight 373 lb 10.9 oz (169.5 kg), SpO2 98 %.  Filed Weights   06/24/16 1508 06/25/16 0400  Weight: 373 lb 7.4 oz (169.4 kg) 373 lb 10.9 oz (169.5 kg)    Labs & Radiologic Studies    CBC  Recent Labs  06/29/16 0342 06/30/16 0249  WBC 14.3* 13.9*  NEUTROABS  --  9.5*  HGB 15.5 14.1  HCT 48.0 44.5  MCV 87.8 88.6  PLT 164 159   Basic Metabolic Panel  Recent Labs  06/29/16 0342 06/30/16 0249  NA 136 135  K 4.5 4.2  CL 101 103  CO2 27 24  GLUCOSE 93 133*  BUN 20 19  CREATININE 1.57* 1.54*  CALCIUM 9.2 8.6*   Liver Function Tests No results for input(s): AST, ALT, ALKPHOS, BILITOT, PROT, ALBUMIN in the last 72 hours. No results for input(s): LIPASE, AMYLASE in the last 72 hours. Cardiac Enzymes No results for input(s): CKTOTAL, CKMB, CKMBINDEX, TROPONINI in the last 72 hours. BNP Invalid input(s): POCBNP D-Dimer No results for input(s): DDIMER in the last 72 hours. Hemoglobin A1C No results for input(s): HGBA1C in the last 72 hours. Fasting Lipid Panel No results for input(s): CHOL, HDL, LDLCALC, TRIG, CHOLHDL, LDLDIRECT in the last 72 hours. Thyroid Function Tests No results for input(s): TSH, T4TOTAL, T3FREE, THYROIDAB in the last 72 hours.  Invalid input(s): FREET3 _____________  Dg Chest Port 1 View  06/24/2016  CLINICAL DATA:   Shortness of breath, chest pain EXAM: PORTABLE CHEST 1 VIEW COMPARISON:  05/10/2015 FINDINGS: Borderline cardiomegaly. Mild elevation of the right hemidiaphragm again noted. Dual lead cardiac pacemaker is unchanged in position. Status post median sternotomy. No infiltrate or pulmonary edema. IMPRESSION: No active disease. Dual lead cardiac pacemaker is stable in position. Status post median sternotomy. Electronically Signed   By: Natasha MeadLiviu  Pop M.D.   On: 06/24/2016 15:57   Disposition   Pt is being discharged home today in good condition.  Follow-up Plans & Appointments    Follow-up Information    Follow up with Laurann Montanaayna N Christyna Letendre, PA-C.   Specialties:  Cardiology, Radiology   Why:  [facility] - 07/08/16 at 8:30am. Note this is at the [facility] [address] location - Kriste BasqueDayna is one of the PAs that works with Dr. Royann Shiversroitoru.   Contact information:   [address] [place] [address] [phone]       Follow up with Devra DoppHOWELL, TAMIEKA, MD.   Specialty:  Family Medicine   Why:  Your white blood  cell count appears perpetually elevated without signs of infection. Follow up with your primary care doctor to follow this.   Contact information:   [address] [place] [address] [phone]      Discharge Instructions    Amb Referral to Cardiac Rehabilitation    Complete by:  As directed   Diagnosis:   Coronary Stents NSTEMI PTCA       Diet - low sodium heart healthy    Complete by:  As directed      Increase activity slowly    Complete by:  As directed   We have held off on restarting your spironolactone since your blood pressure was occasionally low - do not throw this away as we might restart this at your follow-up.  You will be on 3 blood thinners for now - aspirin, clopidogrel, and Eliquis. You will STOP aspirin after 30 days and continue the other two. If you notice any bleeding such as blood in stool, black tarry stools, blood in urine, nosebleeds or  any other unusual bleeding, call your doctor immediately.  No driving for 1 week. No lifting over 10 lbs for 2 weeks. No sexual activity for 2 weeks. Keep procedure site clean & dry. If you notice increased pain, swelling, bleeding or pus, call/return!  You may shower, but no soaking baths/hot tubs/pools for 1 week.           Discharge Medications   Current Discharge Medication List    START taking these medications   Details  clopidogrel (PLAVIX) 75 MG tablet Take 1 tablet (75 mg total) by mouth daily. Qty: 30 tablet, Refills: 11      CONTINUE these medications which have CHANGED   Details  aspirin 81 MG tablet Take 1 tablet (81 mg total) by mouth daily. For 30 days then stop. Qty: 30 tablet, Refills: 0      CONTINUE these medications which have NOT CHANGED   Details  acetaminophen (TYLENOL) 325 MG tablet Take 1-2 tablets (325-650 mg total) by mouth every 4 (four) hours as needed for mild pain.    allopurinol (ZYLOPRIM) 100 MG tablet Take 100 mg by mouth daily.    atorvastatin (LIPITOR) 80 MG tablet take 1 tablet by mouth once daily Qty: 90 tablet, Refills: 3    B-D INS SYR ULTRAFINE 1CC/31G 31G X 5/16" 1 ML MISC Use as directed Refills: 0    BYSTOLIC 10 MG tablet Take 10 mg by mouth daily.  Refills: 0    cholecalciferol (VITAMIN D) 1000 UNITS tablet Take 1,000 Units by mouth every other day.     ELIQUIS 5 MG TABS tablet take 1 tablet by mouth twice a day Qty: 60 tablet, Refills: 5    furosemide (LASIX) 40 MG tablet take 1 tablet by mouth once daily Qty: 90 tablet, Refills: 3    insulin aspart (NOVOLOG) 100 UNIT/ML injection Inject 32 Units into the skin every morning.     INVOKANA 100 MG TABS Take 100 mg by mouth daily.    LANTUS 100 UNIT/ML injection Inject 100 Units into the skin every morning.     Liraglutide (VICTOZA) 18 MG/3ML SOPN Inject 1.2 mg into the skin every morning.     lisinopril (PRINIVIL,ZESTRIL) 10 MG tablet take 1 tablet by mouth once  daily Qty: 90 tablet, Refills: 3    nitroGLYCERIN (NITROSTAT) 0.4 MG SL tablet Place 1 tablet (0.4 mg total) under the tongue every 5 (five) minutes as needed for chest  pain. Qty: 25 tablet, Refills: 5    Omega-3 Fatty Acids (FISH OIL) 1200 MG CAPS Take 1,200 mg by mouth daily.       STOP taking these medications     spironolactone (ALDACTONE) 25 MG tablet      HYDROcodone-acetaminophen (NORCO/VICODIN) 5-325 MG per tablet          Allergies:  Allergies  Allergen Reactions  . Coreg [Carvedilol]     nausea    Aspirin prescribed at discharge?  Yes High Intensity Statin Prescribed? (Lipitor 40-80mg  or Crestor 20-40mg ): Yes Beta Blocker Prescribed? Yes For EF <40%, was ACEI/ARB Prescribed? Yes ADP Receptor Inhibitor Prescribed? (i.e. Plavix etc.-Includes Medically Managed Patients): Yes For EF <40%, Aldosterone Inhibitor Prescribed? No: hypotension Was EF assessed during THIS hospitalization? Yes Was Cardiac Rehab II ordered? (Included Medically managed Patients): Yes but pt refused   Outstanding Labs/Studies   n/a  Duration of Discharge Encounter   Greater than 30 minutes including physician time.  Signed, Laurann Montana PA-C 06/30/2016, 1:36 PM

## 2016-06-30 NOTE — Care Management Important Message (Signed)
Important Message  Patient Details  Name: Matthew GanserGeorge V Gallegos MRN: [id number] Date of Birth: Aug 07, 1946   Medicare Important Message Given:  Yes    Bernadette HoitShoffner, [name] Coleman 06/30/2016, 8:40 AM

## 2016-06-30 NOTE — Progress Notes (Signed)
   Subjective: Pt states he is feeling very well. Denies chest pain, SOB, palpitations overnight. He said he experienced some left leg pain but morphine helped. He was hypoglycemic yesterday while NPO for cath however today he denies headache, lightheadedness, weakness and palpitations.   Objective: Vital signs in last 24 hours: Filed Vitals:   06/29/16 2020 06/29/16 2339 06/30/16 0413 06/30/16 0417  BP: 114/75 106/62 93/48 98/51   Pulse: 64 66  66  Temp: 98.5 F (36.9 C) 97.9 F (36.6 C) 97.9 F (36.6 C)   TempSrc: Oral Oral Oral   Resp: 19 20  19   Height:      Weight:      SpO2: 97% 99%  98%   Physical Exam Physical Exam  Constitutional: No distress.  He is lying in bed sleeping   Neck: No JVD present.  Cardiovascular: Normal rate and regular rhythm.   No murmur heard. Difficult to auscultate due to anatomy.   Pulmonary/Chest: Effort normal and breath sounds normal. No respiratory distress. He has no wheezes.  Abdominal: Soft. Bowel sounds are normal. He exhibits no distension. There is no tenderness.  Skin: He is not diaphoretic.  Extremities: lower extremity pulses present. No lower extremity edema. L femoral cath dressing clean, dry, and intact.   Assessment/Plan: Active Problems:   CAD s/p CABG 1995, graft dependent (LIMA to LAD, SVG to diagonal stented, other grafts occluded)   Chronic systolic CHF (congestive heart failure), NYHA class 2 (HCC)   Morbid obesity (HCC)   DM type 2 causing CKD stage 3 (HCC)   Dyslipidemia - low HDL and high triglycerides   Unstable angina (HCC)   NSTEMI (non-ST elevated myocardial infarction) (HCC)   Acute on chronic renal insufficiency (HCC)  NSTEMI PCI yesterday DES was placed in the mid SVG   - continue ASA, Atorvastatin, clopidogrel, Nebivolol   - continue to wean nitroglycerine drip (currently 2.7 ml/h)    - heparin has been D/Cd   Chronic systolic CHF LVEF 15%, LBEDP 37   -Continue lasix and Nebivolol   - Resume Lisinopril  and aldactone  HLD   - continue atorvastatin   CKD renal function stable BUN 19, SCr 1.54, trending down since yesterday BUN 20 SCr 1.57   - continue to monitor  Chronic Leukocytosis WBC 13.9, has been elevated for this and prior admission   - order CBC w Diff tomorrow   DM Hypoglycemic yesterday SBG 73 > given D5 NS IV > SBG 99   - continue Invokana   - continue ISS  FEN IV NS 10 ml/hr, carb modified diet  DVT Ppx  Scheduled to start lovenox this morning    LOS: 6 days   Eulah PontNina Blum, MD 06/30/2016, 7:32 AM Pager: [phone]  I have seen and examined the patient along with Eulah PontNina Blum, MD.  I have reviewed the chart, notes and new data.  I agree with her note.  Key new complaints: feeling much better, angina completely gone for first time since admission. Weaned off NTG. Key examination changes: no overt CHF, but exam impeded by morbid obesity Key new findings / data: successful PCI-stent distal SVG-diagonal, creatinine unchanged from yesterday, persistent mild elevation in WBC (diff not performed)  PLAN: Ambulate. Depending on BP, will restart ACEi and spironolactone prior to DC later today or at follow up visit in one week. Dual antiplatelet therapy lifelong.  Thurmon FairMihai Calvin Chura, MD, [facility] [phone] 06/30/2016, 9:26 AM

## 2016-06-30 NOTE — Telephone Encounter (Signed)
Croitoru  TOC- 7/20 @ 830 w/ Dayna- per Rosann Auerbachtrish

## 2016-06-30 NOTE — Telephone Encounter (Signed)
[facility]

## 2016-06-30 NOTE — Progress Notes (Signed)
CARDIAC REHAB PHASE I   PRE:  Rate/Rhythm: 61 pacing with PVCs    BP: sitting 126/90    SaO2: 98 RA  MODE:  Ambulation: 310 ft   POST:  Rate/Rhythm: 91 pacing    BP: sitting 147/99     SaO2:   Pt tolerated well, no c/o walking, no assist needed. Ed completed however pt only moderately receptive. Sts he understands importance of his meds/Plavix/ASA. He is not ready to quit smoking, gave him resources and discussion and risks. Discussed increasing ex slowly and keeping low sodium diet. He is not interested in CRPII as he did not like it before. Sts he will do better on his own. Will send referral to [facility]. Gave pt HF booklet but he was not interested in instruction. He does not have a scale (needs one for 400 lbs), encouraged him to ask his insurance if they will provide one.   [phone]  Harriet MassonRandi Kristan Zackariah Vanderpol CES, ACSM 06/30/2016 11:09 AM

## 2016-07-01 NOTE — Telephone Encounter (Signed)
[facility]  Patient understands to follow up with provider D. Dunn, PA [facility]. Patient understands discharge instructions? YES Patient understands medications and regiment? YES Patient understands to bring all medications to this visit? YES

## 2016-07-06 NOTE — Progress Notes (Signed)
Cardiology Office Note    Date:  07/08/2016  ID:  [name], DOB October 11, 1946, MRN [id number] PCP:  Devra Dopp, MD  Cardiologist:  Dr. Royann Shivers   Chief Complaint: f/u NSTEMI  History of Present Illness:  [name] is a 70 y.o. male with history of CAD (CABG 1995 and multiple stents, NSTEMI 06/2016 s/p DES to distal SVG-diag), chronic systolic CHF, ICM s/p Medtronic ICD, pAF, HTN, CKD stage 3,HLD, DM, morbid obesity, gout, dyslipidemia who presents for post-hospital follow-up.  He was adm to [facility] 7/6-7/12 with CP, EKG changes, and peak troponin 5.87. He underwent LHC 06/28/16 which showed severe native CAD with total occ of LAD at ostium, total occ of prox LCx, total off of prox RCA, + bridging collaterals supplying the distal portion of the RCA as well as collaterals from a conus like branch supplying the mid circumflex, old occ of VG supplied by the Cx and the VG which had supplied the RCA, patent LIMA supplying the mid LAD but with subtotal diffuse distal LAD stenosis beyond the LIMA insertion, and patent VG supplying the diagonal with the previously placed stent that is patent but with evidence for a distal 80% eccentric stenosis beyond the stented segment immediately proximal to the anastomosis. LVEF 10-15% (prior EF in 2016 not really known since echo was technically difficult even with definity). LVEDP was 37 and he was treated with diuretics. He was not felt to be a candidate for redo surgical revascularization.Angiograms were reviewed with colleagues with regard to attempt at opening the 80% distal SVG to diagonal stenosis. He was brought back to the lab 06/29/16 and ultimately had successful percutaneous coronary intervention involving an 85% distal SVG stenosis beyond a previously placed 2.538 mm Promus premier stent treated with PTCA/ DES stenting requiring a buddy wire technique with insertion of a 2.2512 mm Synergy DES stent dilated to 2.38 mm. His spironolactone was not  resumed due to hypotension. I had discussed blood thinners with Dr. Royann Shivers at dc -> the plan was to continue triple anticoag therapy for 1 month then drop aspirin but continue Plavix + Eliquis. Of note, there was concern for long-term compliance as the patient was observed eating outside foods during his stay brought in by family (bacon, eggs, hashbrowns, hot dogs, burgers, fries), threatening to leave AMA when meal tray did not arrive, and electing to keep bed at raised height despite education regarding fall risk. He was only moderately receptive to education by cardiac rehab. He was also advised to f/u PCP for persistent leukocytosis.  He comes in today for follow-up overall feeling well. He is asymptomatic. No CP, SOB, LEE, syncope or palpitations. BP is up - he states this is because this is early for him and he has not yet taken his medications for the day. I inquired about his diet at the hospital and he stated "I don't even want to talk about that," citing that he was angry that he did not have a diet ordered so his wife had brought him food. No bleeding issues reported. He declines cardiac rehab.   Past Medical History  Diagnosis Date  . Ischemic cardiomyopathy     a. LVEF 10-15% by cath 06/2016.  Marland Kitchen CAD (coronary artery disease)     a. CABG 1995 and multiple stents. b. Known graft occlusions - NSTEMI 06/2016 s/p DES to distal SVG-diag.  . Chronic systolic CHF (congestive heart failure) (HCC)   . Systemic hypertension   . CKD (chronic kidney disease),  stage III   . DM (diabetes mellitus) (HCC)   . Morbid obesity (HCC)   . DM type 2 causing CKD stage 3 (HCC)   . Gout   . Dyslipidemia - low HDL and high triglycerides   . Myocardial infarction (HCC)   . PONV (postoperative nausea and vomiting)   . NSTEMI (non-ST elevated myocardial infarction) (HCC) 06/26/2016  . Dietary noncompliance   . Leukocytosis   . PAF (paroxysmal atrial fibrillation) [facility])     Past Surgical History  Procedure  Laterality Date  . Coronary artery bypass graft  1995  . Coronary angioplasty with stent placement  01/12/2008    multivessel CAD occluded vein grafts to multiple sites including RCA,obtuse marginal branch & CX.  Successful PTCA and stenting distal graft.  . Left heart catheterization with coronary angiogram N/A 01/29/2015    Procedure: LEFT HEART CATHETERIZATION WITH CORONARY ANGIOGRAM;  Surgeon: Lennette Bihari, MD;  Location: [facility] CATH LAB;  Service: Cardiovascular;  Laterality: N/A;  . Tonsillectomy    . Ep implantable device N/A 05/09/2015    Procedure: Icd Implant;  Surgeon: Thurmon Fair, MD;  Location: [facility] INVASIVE CV LAB;  Service: Cardiovascular;  Laterality: N/A;  . Cardiac catheterization N/A 06/28/2016    Procedure: Left Heart Cath and Cors/Grafts Angiography;  Surgeon: Lennette Bihari, MD;  Location: [facility] INVASIVE CV LAB;  Service: Cardiovascular;  Laterality: N/A;  . Cardiac catheterization N/A 06/29/2016    Procedure: Coronary Stent Intervention;  Surgeon: Lennette Bihari, MD;  Location: [facility] INVASIVE CV LAB;  Service: Cardiovascular;  Laterality: N/A;    Current Medications: Current Outpatient Prescriptions  Medication Sig Dispense Refill  . acetaminophen (TYLENOL) 325 MG tablet Take 1-2 tablets (325-650 mg total) by mouth every 4 (four) hours as needed for mild pain.    Marland Kitchen allopurinol (ZYLOPRIM) 100 MG tablet Take 100 mg by mouth daily.    Marland Kitchen aspirin 81 MG tablet Take 1 tablet (81 mg total) by mouth daily. For 30 days then stop. 30 tablet 0  . atorvastatin (LIPITOR) 80 MG tablet take 1 tablet by mouth once daily 90 tablet 3  . B-D INS SYR ULTRAFINE 1CC/31G 31G X 5/16" 1 ML MISC Use as directed  0  . BYSTOLIC 10 MG tablet Take 10 mg by mouth daily.   0  . cholecalciferol (VITAMIN D) 1000 UNITS tablet Take 1,000 Units by mouth every other day.     . clopidogrel (PLAVIX) 75 MG tablet Take 1 tablet (75 mg total) by mouth daily. 30 tablet 11  . ELIQUIS 5 MG TABS tablet take 1 tablet by mouth  twice a day 60 tablet 5  . furosemide (LASIX) 40 MG tablet take 1 tablet by mouth once daily 90 tablet 3  . insulin aspart (NOVOLOG) 100 UNIT/ML injection Inject 32 Units into the skin every morning.     . INVOKANA 100 MG TABS Take 100 mg by mouth daily.    Marland Kitchen LANTUS 100 UNIT/ML injection Inject 100 Units into the skin every morning.     . Liraglutide (VICTOZA) 18 MG/3ML SOPN Inject 1.2 mg into the skin every morning.     Marland Kitchen lisinopril (PRINIVIL,ZESTRIL) 10 MG tablet take 1 tablet by mouth once daily 90 tablet 3  . nitroGLYCERIN (NITROSTAT) 0.4 MG SL tablet place 1 tablet under the tongue if needed every 5 minutes for chest pain  0  . Omega-3 Fatty Acids (FISH OIL) 1200 MG CAPS Take 1,200 mg by mouth daily.  No current facility-administered medications for this visit.     Allergies:   Coreg   Social History   Social History  . Marital Status: Married    Spouse Name: N/A  . Number of Children: N/A  . Years of Education: N/A   Social History Main Topics  . Smoking status: Current Some Day Smoker    Types: Cigarettes  . Smokeless tobacco: Never Used     Comment: "1-2 cigarettes per day"  . Alcohol Use: 0.0 oz/week    0 Standard drinks or equivalent per week     Comment: rare  . Drug Use: No  . Sexual Activity: Not Asked   Other Topics Concern  . None   Social History Narrative     Family History:  The patient's family history includes Dementia in his mother; Hearing loss in his other; Heart failure in his mother; Hypertension in his other.   ROS:   Please see the history of present illness.  All other systems are reviewed and otherwise negative.    PHYSICAL EXAM:   VS:  BP 150/60 mmHg  Pulse 80  Ht 6' 7.5" (2.019 m)  Wt 373 lb (169.192 kg)  BMI 41.51 kg/m2  BMI: Body mass index is 41.51 kg/(m^2). GEN: Well nourished, well developed morbidly obese AAM, in no acute distress HEENT: normocephalic, atraumatic Neck: no JVD, carotid bruits, or masses Cardiac: RRR; no  murmurs, rubs, or gallops, no edema  Respiratory:  clear to auscultation bilaterally, normal work of breathing GI: soft, nontender, nondistended, + BS. signif pannus. MS: no deformity or atrophy Skin: warm and dry, no rash. Left femoral cath site without hematoma, ecchymosis, or bruit. Neuro:  Alert and Oriented x 3, Strength and sensation are intact, follows commands Psych: euthymic mood, full affect  Wt Readings from Last 3 Encounters:  07/08/16 373 lb (169.192 kg)  06/30/16 372 lb 2.2 oz (168.8 kg)  03/08/16 373 lb 6.4 oz (169.373 kg)      Studies/Labs Reviewed:   EKG:  EKG was ordered today and personally reviewed by me and demonstrates NSR 80bpm, PVC couplet, prior anteiror infarction, nonspecific ST-T changes  Recent Labs: 06/30/2016: BUN 19; Creatinine, Ser 1.54*; Hemoglobin 14.1; Platelets 159; Potassium 4.2; Sodium 135   Lipid Panel    Component Value Date/Time   CHOL 111 06/25/2016 0333   TRIG 108 06/25/2016 0333   HDL 26* 06/25/2016 0333   CHOLHDL 4.3 06/25/2016 0333   VLDL 22 06/25/2016 0333   LDLCALC 63 06/25/2016 0333    Additional studies/ records that were reviewed today include: Summarized above.   ASSESSMENT & PLAN:   1. CAD with recent NSTEMI - asymptomatic. Per plan above, stop ASA after 30 days (last day 07/29/16).  Continue BB, statin, Plavix. He unfortunately declines cardiac rehab. 2. Paroxysmal atrial fib - asymptomatic, maintaining NSR. Bleeding precautions reviewed. Continue Eliquis. 3. Chronic systolic CHF/ICM - remains asymptomatic. Continue BB, ACEI. See below re: spironolactone. Further monitoring of LV function per Dr. Royann Shiversroitoru. 4. Essential HTN - BP running higher in clinic. He states this is because he has not taken his meds yet today (usually does so around 10-11am). He was hypotensive in the hospital. He has a cuff at home. I have asked him and his wife to take his BP daily around 3pm for the next several days and call on Monday with the  readings. If running >110 systolic, would recommend to resume spironolactone 25mg  daily with repeat BMET in a week. 5. Morbid obesity -  he is not very receptive to discussions regarding food or activity which is worrisome for his prognosis long-term. He refused to discuss the outside food that was brought to him in the hospital (bacon, eggs, burger, hot dog, etc). He declines cardiac rehab or nutrition referral.  6. CKD stage III - will need repeat BMET in 1 week if spironolactone is resumed as above.  Disposition: F/u with Dr. Royann Shivers as scheduled 08/2016.  Medication Adjustments/Labs and Tests Ordered: Current medicines are reviewed at length with the patient today.  Concerns regarding medicines are outlined above. Medication changes, Labs and Tests ordered today are summarized above and listed in the Patient Instructions accessible in Encounters.   Thomasene Mohair PA-C  07/08/2016 9:02 AM    [facility] [address] [place], [place], [place]  [address] Phone: [phone]; Fax: [phone]

## 2016-07-08 ENCOUNTER — Ambulatory Visit (INDEPENDENT_AMBULATORY_CARE_PROVIDER_SITE_OTHER): Payer: Medicare Other | Admitting: Physician Assistant

## 2016-07-08 ENCOUNTER — Encounter: Payer: Self-pay | Admitting: Physician Assistant

## 2016-07-08 VITALS — BP 150/60 | HR 80 | Ht 79.5 in | Wt 373.0 lb

## 2016-07-08 DIAGNOSIS — I214 Non-ST elevation (NSTEMI) myocardial infarction: Secondary | ICD-10-CM | POA: Diagnosis not present

## 2016-07-08 DIAGNOSIS — I5022 Chronic systolic (congestive) heart failure: Secondary | ICD-10-CM

## 2016-07-08 DIAGNOSIS — I255 Ischemic cardiomyopathy: Secondary | ICD-10-CM

## 2016-07-08 DIAGNOSIS — I480 Paroxysmal atrial fibrillation: Secondary | ICD-10-CM | POA: Diagnosis not present

## 2016-07-08 DIAGNOSIS — I10 Essential (primary) hypertension: Secondary | ICD-10-CM

## 2016-07-08 DIAGNOSIS — I2510 Atherosclerotic heart disease of native coronary artery without angina pectoris: Secondary | ICD-10-CM

## 2016-07-08 DIAGNOSIS — Z9861 Coronary angioplasty status: Secondary | ICD-10-CM

## 2016-07-08 DIAGNOSIS — N183 Chronic kidney disease, stage 3 unspecified: Secondary | ICD-10-CM

## 2016-07-08 NOTE — Patient Instructions (Addendum)
Medication Instructions:  Your physician has recommended you make the following change in your medication:  1.  You will take your LAST DOSE of ASPIRIN 07/29/16  Labwork: None ordered  Testing/Procedures: None ordered  Follow-Up: Your physician recommends that you schedule a follow-up appointment in: SEE DR. Royann ShiversROITORU AS SCHEDULED   Any Other Special Instructions Will Be Listed Below (If Applicable).  CHECK YOUR BLOOD PRESSURE DAILY AT 3:00 P,M. AND CALL US ON Monday, 07/12/16 WITH THE READINGS. [phone]   If you need a refill on your cardiac medications before your next appointment, please call your pharmacy.

## 2016-08-24 ENCOUNTER — Ambulatory Visit (INDEPENDENT_AMBULATORY_CARE_PROVIDER_SITE_OTHER): Payer: Medicare Other | Admitting: Cardiovascular Disease

## 2016-08-24 ENCOUNTER — Encounter: Payer: Self-pay | Admitting: Cardiovascular Disease

## 2016-08-24 VITALS — BP 120/80 | HR 62 | Ht 79.5 in | Wt 376.0 lb

## 2016-08-24 DIAGNOSIS — N183 Chronic kidney disease, stage 3 unspecified: Secondary | ICD-10-CM

## 2016-08-24 DIAGNOSIS — Z95810 Presence of automatic (implantable) cardiac defibrillator: Secondary | ICD-10-CM | POA: Diagnosis not present

## 2016-08-24 DIAGNOSIS — I472 Ventricular tachycardia: Secondary | ICD-10-CM

## 2016-08-24 DIAGNOSIS — I4729 Other ventricular tachycardia: Secondary | ICD-10-CM

## 2016-08-24 DIAGNOSIS — I10 Essential (primary) hypertension: Secondary | ICD-10-CM

## 2016-08-24 DIAGNOSIS — I255 Ischemic cardiomyopathy: Secondary | ICD-10-CM

## 2016-08-24 DIAGNOSIS — I480 Paroxysmal atrial fibrillation: Secondary | ICD-10-CM | POA: Diagnosis not present

## 2016-08-24 DIAGNOSIS — I25708 Atherosclerosis of coronary artery bypass graft(s), unspecified, with other forms of angina pectoris: Secondary | ICD-10-CM

## 2016-08-24 DIAGNOSIS — I5042 Chronic combined systolic (congestive) and diastolic (congestive) heart failure: Secondary | ICD-10-CM

## 2016-08-24 DIAGNOSIS — Z794 Long term (current) use of insulin: Secondary | ICD-10-CM

## 2016-08-24 DIAGNOSIS — E0822 Diabetes mellitus due to underlying condition with diabetic chronic kidney disease: Secondary | ICD-10-CM

## 2016-08-24 DIAGNOSIS — E785 Hyperlipidemia, unspecified: Secondary | ICD-10-CM

## 2016-08-24 NOTE — Patient Instructions (Signed)
Dr Royann Shiversroitoru recommends that you continue on your current medications as directed. Please refer to the Current Medication list given to you today.  Remote monitoring is used to monitor your Pacemaker of ICD from home. This monitoring reduces the number of office visits required to check your device to one time per year. It allows us to keep an eye on the functioning of your device to ensure it is working properly. You are scheduled for a device check from home on Tuesday, December 5th. 2017. You may send your transmission at any time that day. If you have a wireless device, the transmission will be sent automatically. After your physician reviews your transmission, you will receive a postcard with your next transmission date.  Dr Royann Shiversroitoru recommends that you schedule a follow-up appointment in 6 months with a defibrillator check. You will receive a reminder letter in the mail two months in advance. If you don't receive a letter, please call our office to schedule the follow-up appointment.  If you need a refill on your cardiac medications before your next appointment, please call your pharmacy.

## 2016-08-24 NOTE — Progress Notes (Signed)
Patient ID: [name], male   DOB: October 07, 1946, 70 y.o.   MRN: [id number]    Cardiology Office Note    Date:  08/25/2016   ID:  [name], DOB 04-11-1946, MRN [id number]  PCP:  Devra Dopp, MD  Cardiologist:   Thurmon Fair, MD   Chief Complaint  Patient presents with  . Follow-up    History of Present Illness:  [name] is a 70 y.o. male with severe CAD and cardiomyopathy, combined systolic and diastolic HF, s/p ICD, PAF, morbid obesity.  He denies angina, orthopnea, edema, syncope, ICD shocks or palpitations. No neuro events and no bleeding. Has not needed any adjustment in his low dose of daily loop diuretic. He reports that glucose control is "pretty good". He has not taken any sublingual nitroglycerin.  ICD check shows normal device function. Optivol at baseline, without evidence of fluid overload. No recent sustained arrhythmia. Battery and lead parameters are normal. The burden of AF is low. No meaningful atrial fibrillation has occurred since August 2016. He has roughly 50% atrial pacing and no ventricular pacing.  The device has recorded a handful of episodes of high ventricular rates which all appear to be nonsustained ventricular tachycardia, maximum duration 14 beats, rates in the 200-229 bpm range or the most recent occurred on July 11, 2016  [name] had an anterior STEMI on Feb 01, 2015, due to acute occlusion of the previously stented SVG-diagonal artery and had emergency PCI with a 2.5 x 38 mm Promus DES and a downstream POBA of this vessel. The only other patent conduit is the LIMA to LAD (native vessels, SVG to RCA and SVG to LCX are all chronically occluded). He has at least moderately depressed LVEF. At one point LVEF was estimated at 15%, most recent echo probably overestimates his real ejection fraction. He is morbidly obese with a BMI well over 40. He has excellent control of diabetes mellitus with a hemoglobin A1c around 6%. It is very likely that  he has obstructive sleep apnea but he declines both diagnostic sleep study and treatment with CPAP. His CHADSVASC score is 100 (age, CAD, CHF, DM, HTN)..  Past Medical History:  Diagnosis Date  . CAD (coronary artery disease)    a. CABG 1995 and multiple stents. b. Known graft occlusions - NSTEMI 06/2016 s/p DES to distal SVG-diag.  . Chronic systolic CHF (congestive heart failure) (HCC)   . CKD (chronic kidney disease), stage III   . Dietary noncompliance   . DM (diabetes mellitus) (HCC)   . DM type 2 causing CKD stage 3 (HCC)   . Dyslipidemia - low HDL and high triglycerides   . Gout   . Ischemic cardiomyopathy    a. LVEF 10-15% by cath 06/2016.  Marland Kitchen Leukocytosis   . Morbid obesity (HCC)   . Myocardial infarction (HCC)   . NSTEMI (non-ST elevated myocardial infarction) (HCC) 06/26/2016  . PAF (paroxysmal atrial fibrillation) (HCC)   . PONV (postoperative nausea and vomiting)   . Systemic hypertension     Past Surgical History:  Procedure Laterality Date  . CARDIAC CATHETERIZATION N/A 06/28/2016   Procedure: Left Heart Cath and Cors/Grafts Angiography;  Surgeon: Lennette Bihari, MD;  Location[facility] INVASIVE CV LAB;  Service: Cardiovascular;  Laterality: N/A;  . CARDIAC CATHETERIZATION N/A 06/29/2016   Procedure: Coronary Stent Intervention;  Surgeon: Lennette Bihari, MD;  Location: [facility] INVASIVE CV LAB;  Service: Cardiovascular;  Laterality: N/A;  . CORONARY ANGIOPLASTY WITH STENT PLACEMENT  01/12/2008   multivessel CAD occluded vein grafts to multiple sites including RCA,obtuse marginal branch & CX.  Successful PTCA and stenting distal graft.  . CORONARY ARTERY BYPASS GRAFT  1995  . EP IMPLANTABLE DEVICE N/A 05/09/2015   Procedure: Icd Implant;  Surgeon: Thurmon FairMihai Devine Klingel, MD;  Location: [facility] INVASIVE CV LAB;  Service: Cardiovascular;  Laterality: N/A;  . LEFT HEART CATHETERIZATION WITH CORONARY ANGIOGRAM N/A 01/29/2015   Procedure: LEFT HEART CATHETERIZATION WITH CORONARY ANGIOGRAM;  Surgeon: Lennette Biharihomas A  Kelly, MD;  [facility];  Service: Cardiovascular;  Laterality: N/A;  . TONSILLECTOMY      Outpatient Medications Prior to Visit  Medication Sig Dispense Refill  . acetaminophen (TYLENOL) 325 MG tablet Take 1-2 tablets (325-650 mg total) by mouth every 4 (four) hours as needed for mild pain.    Marland Kitchen. allopurinol (ZYLOPRIM) 100 MG tablet Take 100 mg by mouth daily.    Marland Kitchen. atorvastatin (LIPITOR) 80 MG tablet take 1 tablet by mouth once daily 90 tablet 3  . B-D INS SYR ULTRAFINE 1CC/31G 31G X 5/16" 1 ML MISC Use as directed  0  . cholecalciferol (VITAMIN D) 1000 UNITS tablet Take 1,000 Units by mouth every other day.     . clopidogrel (PLAVIX) 75 MG tablet Take 1 tablet (75 mg total) by mouth daily. 30 tablet 11  . ELIQUIS 5 MG TABS tablet take 1 tablet by mouth twice a day 60 tablet 5  . furosemide (LASIX) 40 MG tablet take 1 tablet by mouth once daily 90 tablet 3  . insulin aspart (NOVOLOG) 100 UNIT/ML injection Inject 32 Units into the skin every morning.     . INVOKANA 100 MG TABS Take 100 mg by mouth daily.    Marland Kitchen. LANTUS 100 UNIT/ML injection Inject 100 Units into the skin every morning.     Marland Kitchen. lisinopril (PRINIVIL,ZESTRIL) 10 MG tablet take 1 tablet by mouth once daily 90 tablet 3  . nitroGLYCERIN (NITROSTAT) 0.4 MG SL tablet place 1 tablet under the tongue if needed every 5 minutes for chest pain  0  . Omega-3 Fatty Acids (FISH OIL) 1200 MG CAPS Take 1,200 mg by mouth daily.     Marland Kitchen. aspirin 81 MG tablet Take 1 tablet (81 mg total) by mouth daily. For 30 days then stop. (Patient not taking: Reported on 08/24/2016) 30 tablet 0  . BYSTOLIC 10 MG tablet Take 10 mg by mouth daily.   0  . Liraglutide (VICTOZA) 18 MG/3ML SOPN Inject 1.2 mg into the skin every morning.      No facility-administered medications prior to visit.      Allergies:   Coreg [carvedilol]   Social History   Social History  . Marital status: Married    Spouse name: N/A  . Number of children: N/A  . Years of  education: N/A   Social History Main Topics  . Smoking status: Current Some Day Smoker    Types: Cigarettes  . Smokeless tobacco: Never Used     Comment: "1-2 cigarettes per day"  . Alcohol use 0.0 oz/week     Comment: rare  . Drug use: No  . Sexual activity: Not Asked   Other Topics Concern  . None   Social History Narrative  . None     Family History:  The patient's family history includes Dementia in his mother; Hearing loss in his other; Heart failure in his mother; Hypertension in his other.   ROS:   Please see the history of  present illness.    ROS All other systems reviewed and are negative.   PHYSICAL EXAM:   VS:  BP 120/80 (BP Location: Left Arm, Patient Position: Sitting, Cuff Size: Large)   Pulse 62   Ht 6' 7.5" (2.019 m)   Wt (!) 376 lb (170.6 kg)   SpO2 97%   BMI 41.83 kg/m    GEN: Exam difficult due to obesity, well developed, in no acute distress, morbidly obese  HEENT: normal  Neck: no JVD, carotid bruits, or masses Cardiac:RRR, occasional extra beats; no murmurs, rubs, or gallops,no edema, healthy subclavian ICD site Respiratory: clear to auscultation bilaterally, normal work of breathing GI: soft, nontender, nondistended, + BS MS: no deformity or atrophy  Skin: warm and dry, no rash Neuro: Alert and Oriented x 3, Strength and sensation are intact Psych: euthymic mood, full affect  Wt Readings from Last 3 Encounters:  08/24/16 (!) 376 lb (170.6 kg)  07/08/16 (!) 373 lb (169.2 kg)  06/30/16 (!) 372 lb 2.2 oz (168.8 kg)      Studies/Labs Reviewed:   EKG:  EKG is not ordered today.    Recent Labs: 06/30/2016: BUN 19; Creatinine, Ser 1.54; Hemoglobin 14.1; Platelets 159; Potassium 4.2; Sodium 135   Lipid Panel    Component Value Date/Time   CHOL 111 06/25/2016 0333   TRIG 108 06/25/2016 0333   HDL 26 (L) 06/25/2016 0333   CHOLHDL 4.3 06/25/2016 0333   VLDL 22 06/25/2016 0333   LDLCALC 63 06/25/2016 0333       ASSESSMENT:    1.  Chronic combined systolic and diastolic CHF (congestive heart failure) (HCC)   2. Paroxysmal atrial fibrillation (HCC)   3. NSVT (nonsustained ventricular tachycardia) (HCC)   4. Coronary artery disease involving coronary bypass graft of native heart with other forms of angina pectoris (HCC)   5. Cardiomyopathy, ischemic   6. Essential hypertension   7. Diabetes mellitus due to underlying condition, controlled, with stage 3 chronic kidney disease, with long-term current use of insulin (HCC)   8. CKD (chronic kidney disease), stage III   9. AICD (automatic cardioverter/defibrillator) present   10. Morbid obesity, unspecified obesity type (HCC)   11. Dyslipidemia - low HDL and high triglycerides      PLAN:  In order of problems listed above:  1. CHF:  appears euvolemic, NYHA class 2 (sedentary) 2. PAF:  None seen since August 2016. Continue Eliquis. CHADSVasc 5. At his last visit he was on both bisoprolol and nebivolol. Now it seems that he is not taking either one. We'll try to clarify. 3. NSVT: Episodes are very fast but also very brief and have not required device therapy 4. CMP: exact EF uncertain with poor quality echo images. On ACEi and aldosterone antagonist.  5. CAD: no angina, now off antiplatelet after starting anticoagulant, over 1 year since PCI-DES 6. HTN:  BP excellent today, usually much lower.  7. DM on insulin: well controlled.  8. CKD: stable creatinine 9. ICD:  Normal device funtion, Optivol at baseline, Carelink Q 3 mos, office visit Q6 mos. 10. Morbid obesity: Refuses sleep evaluation and CPAP, suspected of OSA. He remains very sedentary. Device documents activity only 0.4 hour/day 11. HLP: excellent LDL, doubt we will see improved HDL without major weight loss.    Medication Adjustments/Labs and Tests Ordered: Current medicines are reviewed at length with the patient today.  Concerns regarding medicines are outlined above.  Medication changes, Labs and Tests  ordered today are listed in  the Patient Instructions below. Patient Instructions  Dr Royann Shivers recommends that you continue on your current medications as directed. Please refer to the Current Medication list given to you today.  Remote monitoring is used to monitor your Pacemaker of ICD from home. This monitoring reduces the number of office visits required to check your device to one time per year. It allows [place] to keep an eye on the functioning of your device to ensure it is working properly. You are scheduled for a device check from home on Tuesday, December 5th. 2017. You may send your transmission at any time that day. If you have a wireless device, the transmission will be sent automatically. After your physician reviews your transmission, you will receive a postcard with your next transmission date.  Dr Royann Shivers recommends that you schedule a follow-up appointment in 6 months with a defibrillator check. You will receive a reminder letter in the mail two months in advance. If you don't receive a letter, please call our office to schedule the follow-up appointment.  If you need a refill on your cardiac medications before your next appointment, please call your pharmacy.      Signed, Thurmon Fair, MD  08/25/2016 3:33 PM    [facility] [address] [place], [place], [place]  [address] Phone: [phone]; Fax: [phone]

## 2016-08-25 ENCOUNTER — Telehealth: Payer: Self-pay

## 2016-08-25 DIAGNOSIS — I472 Ventricular tachycardia, unspecified: Secondary | ICD-10-CM | POA: Insufficient documentation

## 2016-08-25 NOTE — Telephone Encounter (Signed)
lmtcb

## 2016-08-31 LAB — CUP PACEART INCLINIC DEVICE CHECK
Brady Statistic AP VP Percent: 0.06 %
Brady Statistic AS VP Percent: 0.04 %
Brady Statistic RA Percent Paced: 50.76 %
Brady Statistic RV Percent Paced: 0.1 %
Date Time Interrogation Session: 20170905155940
HIGH POWER IMPEDANCE MEASURED VALUE: 52 Ohm
HIGH POWER IMPEDANCE MEASURED VALUE: 63 Ohm
Lead Channel Impedance Value: 361 Ohm
Lead Channel Pacing Threshold Amplitude: 0.875 V
Lead Channel Sensing Intrinsic Amplitude: 19 mV
Lead Channel Sensing Intrinsic Amplitude: 19 mV
Lead Channel Sensing Intrinsic Amplitude: 6.375 mV
Lead Channel Setting Pacing Amplitude: 1.5 V
Lead Channel Setting Pacing Amplitude: 2 V
Lead Channel Setting Sensing Sensitivity: 0.3 mV
MDC IDC MSMT BATTERY REMAINING LONGEVITY: 108 mo
MDC IDC MSMT BATTERY VOLTAGE: 3 V
MDC IDC MSMT LEADCHNL RA IMPEDANCE VALUE: 475 Ohm
MDC IDC MSMT LEADCHNL RA PACING THRESHOLD AMPLITUDE: 0.75 V
MDC IDC MSMT LEADCHNL RA PACING THRESHOLD PULSEWIDTH: 0.4 ms
MDC IDC MSMT LEADCHNL RA SENSING INTR AMPL: 6.375 mV
MDC IDC MSMT LEADCHNL RV IMPEDANCE VALUE: 456 Ohm
MDC IDC MSMT LEADCHNL RV PACING THRESHOLD PULSEWIDTH: 0.4 ms
MDC IDC SET LEADCHNL RV PACING PULSEWIDTH: 0.4 ms
MDC IDC STAT BRADY AP VS PERCENT: 50.7 %
MDC IDC STAT BRADY AS VS PERCENT: 49.2 %

## 2016-09-01 ENCOUNTER — Encounter: Payer: Self-pay | Admitting: Cardiovascular Disease

## 2016-09-13 ENCOUNTER — Encounter: Payer: Self-pay | Admitting: Cardiovascular Disease

## 2016-11-23 ENCOUNTER — Ambulatory Visit (INDEPENDENT_AMBULATORY_CARE_PROVIDER_SITE_OTHER): Payer: Medicare Other | Admitting: *Deleted

## 2016-11-23 ENCOUNTER — Telehealth: Payer: Self-pay | Admitting: Cardiology

## 2016-11-23 DIAGNOSIS — I255 Ischemic cardiomyopathy: Secondary | ICD-10-CM | POA: Diagnosis not present

## 2016-11-23 NOTE — Telephone Encounter (Signed)
Spoke with pt and reminded pt of remote transmission that is due today. Pt verbalized understanding.   

## 2016-11-24 NOTE — Progress Notes (Signed)
Remote ICD transmission.   

## 2016-12-01 ENCOUNTER — Encounter: Payer: Self-pay | Admitting: Cardiology

## 2016-12-07 ENCOUNTER — Telehealth: Payer: Self-pay

## 2016-12-07 LAB — CUP PACEART REMOTE DEVICE CHECK
Battery Remaining Longevity: 104 mo
Battery Voltage: 2.97 V
Brady Statistic AS VS Percent: 52.45 %
Date Time Interrogation Session: 20171205205741
HIGH POWER IMPEDANCE MEASURED VALUE: 65 Ohm
HighPow Impedance: 51 Ohm
Lead Channel Impedance Value: 361 Ohm
Lead Channel Impedance Value: 456 Ohm
Lead Channel Pacing Threshold Pulse Width: 0.4 ms
Lead Channel Sensing Intrinsic Amplitude: 18.75 mV
Lead Channel Sensing Intrinsic Amplitude: 6 mV
Lead Channel Setting Pacing Amplitude: 1.5 V
MDC IDC MSMT LEADCHNL RA IMPEDANCE VALUE: 532 Ohm
MDC IDC MSMT LEADCHNL RA PACING THRESHOLD AMPLITUDE: 0.75 V
MDC IDC MSMT LEADCHNL RA SENSING INTR AMPL: 6 mV
MDC IDC MSMT LEADCHNL RV PACING THRESHOLD AMPLITUDE: 0.75 V
MDC IDC MSMT LEADCHNL RV PACING THRESHOLD PULSEWIDTH: 0.4 ms
MDC IDC MSMT LEADCHNL RV SENSING INTR AMPL: 18.75 mV
MDC IDC PG IMPLANT DT: 20160520
MDC IDC SET LEADCHNL RV PACING AMPLITUDE: 2 V
MDC IDC SET LEADCHNL RV PACING PULSEWIDTH: 0.4 ms
MDC IDC SET LEADCHNL RV SENSING SENSITIVITY: 0.3 mV
MDC IDC STAT BRADY AP VP PERCENT: 0.15 %
MDC IDC STAT BRADY AP VS PERCENT: 47.34 %
MDC IDC STAT BRADY AS VP PERCENT: 0.07 %
MDC IDC STAT BRADY RA PERCENT PACED: 42.35 %
MDC IDC STAT BRADY RV PERCENT PACED: 0.21 %

## 2016-12-13 ENCOUNTER — Other Ambulatory Visit: Payer: Self-pay | Admitting: Cardiovascular Disease

## 2016-12-13 DIAGNOSIS — I472 Ventricular tachycardia, unspecified: Secondary | ICD-10-CM

## 2016-12-14 MED ORDER — CLOPIDOGREL BISULFATE 75 MG PO TABS: 75.0000 mg | ORAL_TABLET | Freq: Every day | ORAL | 2 refills | Status: DC

## 2016-12-14 NOTE — Telephone Encounter (Signed)
Please call pt and schedule appt for follow up due in march/april-refills sent

## 2016-12-16 NOTE — Telephone Encounter (Signed)
Spoke to patient about treated episode from 11/30. Patient states that he was probably sleeping at the time of the episode, bc he doesn't recall any sx's. Patient states that he has been feeling fine lately, and has only had to take NTG once when he was "upset" about a "family issue". I informed patient that Dr.Croitoru would like to get a CMP and Mag. I also informed patient that Dr.Croitoru would like for him to follow up with an EP bc of the episode. Patient voiced understanding of all information.  I also informed patient about his driving restriction [facility]. Again, patient voiced understanding.  Will [facility] scheduling of [facility].

## 2016-12-16 NOTE — Telephone Encounter (Signed)
Attempted to call pt, LVM for pt to call back to discuss ATP episode.

## 2016-12-16 NOTE — Telephone Encounter (Signed)
-----   Message from Thurmon FairMihai Croitoru, MD sent at 12/08/2016 10:58 PM EST ----- Remote reviewed.   Not pacemaker dependent. Battery status is good. Lead measurements are stable. Heart rate histogram is favorable. Appropriate therapy (ATP during charge) for sustained and rapid monomorphic VT in VF zone on Nov 30. Other episodes of shorter VT are seen similar rate and morphology. Low burden of atrial fibrillation. Rate control during AFib is appropriate. Occasional atrial tachycardia with 1:1 AV conduction and faster V rates. Appropriate device discrimination of SVT versus VT via wavelet function.   If you can reach him, I think he should have labs (CMET and Mg) and he needs an outpatient EP consult. I believe Dr. Ladona Ridgelaylor saw him in consultation, but then Dr. Graciela HusbandsKlein performed an EP study on him in 2009.

## 2016-12-17 ENCOUNTER — Other Ambulatory Visit: Payer: Self-pay | Admitting: *Deleted

## 2016-12-17 DIAGNOSIS — I472 Ventricular tachycardia, unspecified: Secondary | ICD-10-CM

## 2016-12-18 LAB — COMPREHENSIVE METABOLIC PANEL
ALBUMIN: 4 g/dL (ref 3.6–5.1)
ALT: 14 U/L (ref 9–46)
AST: 23 U/L (ref 10–35)
Alkaline Phosphatase: 48 U/L (ref 40–115)
BILIRUBIN TOTAL: 0.6 mg/dL (ref 0.2–1.2)
BUN: 17 mg/dL (ref 7–25)
CALCIUM: 9.4 mg/dL (ref 8.6–10.3)
CHLORIDE: 104 mmol/L (ref 98–110)
CO2: 21 mmol/L (ref 20–31)
CREATININE: 1.62 mg/dL — AB (ref 0.70–1.25)
Glucose, Bld: 135 mg/dL — ABNORMAL HIGH (ref 65–99)
Potassium: 4.4 mmol/L (ref 3.5–5.3)
Sodium: 140 mmol/L (ref 135–146)
TOTAL PROTEIN: 7.1 g/dL (ref 6.1–8.1)

## 2016-12-18 LAB — MAGNESIUM: MAGNESIUM: 2 mg/dL (ref 1.5–2.5)

## 2016-12-22 ENCOUNTER — Encounter: Payer: Self-pay | Admitting: Internal Medicine

## 2016-12-22 ENCOUNTER — Ambulatory Visit (INDEPENDENT_AMBULATORY_CARE_PROVIDER_SITE_OTHER): Payer: Medicare Other | Admitting: Internal Medicine

## 2016-12-22 VITALS — BP 142/86 | HR 69 | Ht 79.0 in | Wt 386.2 lb

## 2016-12-22 DIAGNOSIS — I472 Ventricular tachycardia: Secondary | ICD-10-CM | POA: Diagnosis not present

## 2016-12-22 DIAGNOSIS — I255 Ischemic cardiomyopathy: Secondary | ICD-10-CM

## 2016-12-22 DIAGNOSIS — I480 Paroxysmal atrial fibrillation: Secondary | ICD-10-CM

## 2016-12-22 DIAGNOSIS — I5042 Chronic combined systolic (congestive) and diastolic (congestive) heart failure: Secondary | ICD-10-CM | POA: Diagnosis not present

## 2016-12-22 DIAGNOSIS — Z95810 Presence of automatic (implantable) cardiac defibrillator: Secondary | ICD-10-CM

## 2016-12-22 DIAGNOSIS — I4729 Other ventricular tachycardia: Secondary | ICD-10-CM

## 2016-12-22 LAB — CUP PACEART INCLINIC DEVICE CHECK
Battery Voltage: 3 V
Brady Statistic AP VS Percent: 45.8 %
Brady Statistic AS VP Percent: 0.06 %
Brady Statistic AS VS Percent: 54 %
Brady Statistic RA Percent Paced: 41.4 %
Date Time Interrogation Session: 20180103175120
HighPow Impedance: 51 Ohm
HighPow Impedance: 63 Ohm
Implantable Pulse Generator Implant Date: 20160520
Lead Channel Impedance Value: 456 Ohm
Lead Channel Impedance Value: 475 Ohm
Lead Channel Pacing Threshold Amplitude: 0.75 V
Lead Channel Pacing Threshold Pulse Width: 0.4 ms
Lead Channel Sensing Intrinsic Amplitude: 2.875 mV
Lead Channel Setting Pacing Amplitude: 2 V
MDC IDC MSMT BATTERY REMAINING LONGEVITY: 105 mo
MDC IDC MSMT LEADCHNL RA PACING THRESHOLD AMPLITUDE: 0.625 V
MDC IDC MSMT LEADCHNL RA PACING THRESHOLD PULSEWIDTH: 0.4 ms
MDC IDC MSMT LEADCHNL RV IMPEDANCE VALUE: 361 Ohm
MDC IDC MSMT LEADCHNL RV SENSING INTR AMPL: 25.375 mV
MDC IDC SET LEADCHNL RA PACING AMPLITUDE: 1.5 V
MDC IDC SET LEADCHNL RV PACING PULSEWIDTH: 0.4 ms
MDC IDC SET LEADCHNL RV SENSING SENSITIVITY: 0.3 mV
MDC IDC STAT BRADY AP VP PERCENT: 0.14 %
MDC IDC STAT BRADY RV PERCENT PACED: 0.19 %

## 2016-12-22 NOTE — Progress Notes (Signed)
ELECTROPHYSIOLOGY CONSULT NOTE  Patient ID: MEHRAN Gallegos, MRN: [id number], DOB/AGE: June 29, 1946 71 y.o. Admit date: (Not on file) Date of Consult: 12/22/2016  Primary Physician: Devra Dopp, MD Primary Cardiologist: [facility] Consulting Physician MCr  Chief Complaint: *VT   HPI [name] is a 71 y.o. male  Seen for VT treated antitachycardia pacing   He has a history of ischemic cardiomyopathy with prior anterior wall MI stenting and pole. He also has bypass surgery with a patent LIMA and occluded vein grafts.  He has a history of paroxysmal atrial fibrillation with a CHADS-VASc score 5.  LHC most recently 7/17 EF 10-15% with an LVEDP of 37. Native vessels were occluded. His vein graft to the RCA and circumflex were occluded. There was a vein graft to his diagonal with an 80% stenosis at the anastomosis for which she subsequently underwent stenting  He's had chest pain as a manifestation of ischemia. He has had no chest pain or discomfort hisVolume status may have been up right after Thanksgiving as they splurged a little bit on their diet  He is largely nonambulatory.  He was exercibut has had problems with his knees. He is now sedentary  He sleeps 12 hours a day or so. He watches TV 2 or 3 hours at a time twice a day but does not fall asleep    Past Medical History:  Diagnosis Date  . CAD (coronary artery disease)    a. CABG 1995 and multiple stents. b. Known graft occlusions - NSTEMI 06/2016 s/p DES to distal SVG-diag.  . Chronic systolic CHF (congestive heart failure) (HCC)   . CKD (chronic kidney disease), stage III   . Dietary noncompliance   . DM (diabetes mellitus) (HCC)   . DM type 2 causing CKD stage 3 (HCC)   . Dyslipidemia - low HDL and high triglycerides   . Gout   . Ischemic cardiomyopathy    a. LVEF 10-15% by cath 06/2016.  Marland Kitchen Leukocytosis   . Morbid obesity (HCC)   . Myocardial infarction   . NSTEMI (non-ST elevated myocardial infarction) (HCC)  06/26/2016  . PAF (paroxysmal atrial fibrillation) (HCC)   . PONV (postoperative nausea and vomiting)   . Systemic hypertension       Surgical History:  Past Surgical History:  Procedure Laterality Date  . CARDIAC CATHETERIZATION N/A 06/28/2016   Procedure: Left Heart Cath and Cors/Grafts Angiography;  Surgeon: Lennette Bihari, MD;  Location: [facility] INVASIVE CV LAB;  Service: Cardiovascular;  Laterality: N/A;  . CARDIAC CATHETERIZATION N/A 06/29/2016   Procedure: Coronary Stent Intervention;  Surgeon: Lennette Bihari, MD;  Location: [facility] INVASIVE CV LAB;  Service: Cardiovascular;  Laterality: N/A;  . CORONARY ANGIOPLASTY WITH STENT PLACEMENT  01/12/2008   multivessel CAD occluded vein grafts to multiple sites including RCA,obtuse marginal branch & CX.  Successful PTCA and stenting distal graft.  . CORONARY ARTERY BYPASS GRAFT  1995  . EP IMPLANTABLE DEVICE N/A 05/09/2015   Procedure: Icd Implant;  Surgeon: Thurmon Fair, MD;  Location: [facility] INVASIVE CV LAB;  Service: Cardiovascular;  Laterality: N/A;  . LEFT HEART CATHETERIZATION WITH CORONARY ANGIOGRAM N/A 01/29/2015   Procedure: LEFT HEART CATHETERIZATION WITH CORONARY ANGIOGRAM;  Surgeon: Lennette Bihari, MD;  Location: [facility] CATH LAB;  Service: Cardiovascular;  Laterality: N/A;  . TONSILLECTOMY       Home Meds: Prior to Admission medications   Medication Sig Start Date End Date Taking? Authorizing Provider  acetaminophen (TYLENOL) 325 MG  tablet Take 1-2 tablets (325-650 mg total) by mouth every 4 (four) hours as needed for mild pain. 05/10/15  Yes Luke K Kilroy, PA-C  allopurinol (ZYLOPRIM) 100 MG tablet Take 100 mg by mouth daily.   Yes Historical Provider, MD  atorvastatin (LIPITOR) 80 MG tablet take 1 tablet by mouth once daily 05/25/16  Yes Mihai Croitoru, MD  B-D INS SYR ULTRAFINE 1CC/31G 31G X 5/16" 1 ML MISC Use as directed 06/03/16  Yes Historical Provider, MD  cholecalciferol (VITAMIN D) 1000 UNITS tablet Take 1,000 Units by mouth every other day.     Yes Historical Provider, MD  clopidogrel (PLAVIX) 75 MG tablet Take 1 tablet (75 mg total) by mouth daily. 12/14/16  Yes Mihai Croitoru, MD  ELIQUIS 5 MG TABS tablet take 1 tablet by mouth twice a day 12/14/16  Yes Mihai Croitoru, MD  furosemide (LASIX) 40 MG tablet take 1 tablet by mouth once daily 04/27/16  Yes Mihai Croitoru, MD  insulin aspart (NOVOLOG) 100 UNIT/ML injection Inject 32 Units into the skin every morning.    Yes Historical Provider, MD  INVOKANA 100 MG TABS Take 100 mg by mouth daily. 12/28/13  Yes Historical Provider, MD  LANTUS 100 UNIT/ML injection Inject 100 Units into the skin every morning.  12/28/13  Yes Historical Provider, MD  lisinopril (PRINIVIL,ZESTRIL) 10 MG tablet take 1 tablet by mouth once daily 04/27/16  Yes Mihai Croitoru, MD  nitroGLYCERIN (NITROSTAT) 0.4 MG SL tablet place 1 tablet under the tongue if needed every 5 minutes for chest pain 06/30/16  Yes Historical Provider, MD  Omega-3 Fatty Acids (FISH OIL) 1200 MG CAPS Take 1,200 mg by mouth daily.    Yes Historical Provider, MD  VICTOZA 18 MG/3ML SOPN Inject 1.2 mg into the skin daily. 06/02/16  Yes Historical Provider, MD    Allergies:  Allergies  Allergen Reactions  . Coreg [Carvedilol] Nausea And Vomiting    Social History   Social History  . Marital status: Married    Spouse name: N/A  . Number of children: N/A  . Years of education: N/A   Occupational History  . Not on file.   Social History Main Topics  . Smoking status: Current Some Day Smoker    Types: Cigarettes  . Smokeless tobacco: Never Used     Comment: "1-2 cigarettes per day"  . Alcohol use 0.0 oz/week     Comment: rare  . Drug use: No  . Sexual activity: Not on file   Other Topics Concern  . Not on file   Social History Narrative  . No narrative on file     Family History  Problem Relation Age of Onset  . Heart failure Mother   . Dementia Mother   . Hearing loss Other   . Hypertension Other      ROS:  Please see  the history of present illness.     All other systems reviewed and negative.    Physical Exam: Blood pressure (!) 142/86, pulse 69, height 6\' 7"  (2.007 m), weight (!) 386 lb 3.2 oz (175.2 kg), SpO2 97 %. General: Well developed Morbidly obese male in no acute distress. Head: Normocephalic, atraumatic, sclera non-icteric, no xanthomas, nares are without discharge. EENT: normal  Lymph Nodes:  none Neck: Negative for carotid bruits. JVD >10 ( I think) Back:without scoliosis kyphosis Lungs: Clear bilaterally to auscultation without wheezes, rales, or rhonchi. Breathing is unlabored. Heart: RRR with S1 S2. No  murmur . No rubs, or gallops appreciated.  Abdomen: Soft, non-tender, non-distended with normoactive bowel sounds. No hepatomegaly. No rebound/guarding. No obvious abdominal masses. Msk:  Strength and tone appear normal for age. Extremities: No clubbing or cyanosis. No edema.  Distal pedal pulses are 2+ and equal bilaterally. Skin: Warm and Dry Neuro: Alert and oriented X 3. CN III-XII intact Grossly normal sensory and motor function . Psych:  Responds to questions appropriately with a normal affect.      Labs: Cardiac Enzymes No results for input(s): CKTOTAL, CKMB, TROPONINI in the last 72 hours. CBC Lab Results  Component Value Date   WBC 13.9 (H) 06/30/2016   HGB 14.1 06/30/2016   HCT 44.5 06/30/2016   MCV 88.6 06/30/2016   PLT 159 06/30/2016   PROTIME: No results for input(s): LABPROT, INR in the last 72 hours. Chemistry  Recent Labs Lab 12/17/16 1038  NA 140  K 4.4  CL 104  CO2 21  BUN 17  CREATININE 1.62*  CALCIUM 9.4  PROT 7.1  BILITOT 0.6  ALKPHOS 48  ALT 14  AST 23  GLUCOSE 135*   Lipids Lab Results  Component Value Date   CHOL 111 06/25/2016   HDL 26 (L) 06/25/2016   LDLCALC 63 06/25/2016   TRIG 108 06/25/2016   BNP Pro B Natriuretic peptide (BNP)  Date/Time Value Ref Range Status  01/11/2008 08:55 PM 345.0 (H)  Final   Thyroid Function  Tests: No results for input(s): TSH, T4TOTAL, T3FREE, THYROIDAB in the last 72 hours.  Invalid input(s): FREET3 Miscellaneous No results found for: DDIMER  Radiology/Studies:  No results found.  EKG: sinus rhythm with frequent PVCS AT NINETY four bpm iNTERVALS 17/10/35   Assessment and Plan  Ischemic cardiomyopathy  Ventricular tachycardia  Medtronic defibrillator  Morbid obesity  PVCs  Congestive heart failure-chronic-systolic-class III  Abnormal sleeping patterns question sleep apnea  His episode of ventricular tachycardia is isolated. His electrolytes checked 4 weeks afterwards were normal. This occurred temporally related to Thanksgiving and I suspect that his LVEDP was quite high following his dietary indiscretion and electromechanical affects may have been responsible  It does not look as if there were ischemia  I don't think there is anything specifically to do for his VT at this time.  I am worried about his morbid obesity  We discussed exercise programs that are not  Weight bearing  We discussed diets including  [place]  I wonder whether he would benefit from a sleep study  His PVC burden appears quite hi I wonder how it may be contributing to his cardiomyopathy  I will be in touch with Dr. Mitchell Heir to do a Holter monitor. Antiarrhythmic suppression may be of benefit    Sherryl Manges

## 2016-12-22 NOTE — Patient Instructions (Signed)
Medication Instructions: - Your physician has recommended you make the following change in your medication:  1) lasix (furosemide) 40 mg- take 2 tablets (80 mg) once every other day alternating with one tablet (40 mg) once every other day x 10 days, then resume one tablet (40 mg) once daily  Labwork: - none ordered  Procedures/Testing: - none ordered  Follow-Up: - Your physician wants you to follow-up in: 6 months with Matthew BalsamAmber Seiler, NP for Dr. Graciela Gallegos. You will receive a reminder letter in the mail two months in advance. If you don't receive a letter, please call our office to schedule the follow-up appointment.  Any Additional Special Instructions Will Be Listed Below (If Applicable).     If you need a refill on your cardiac medications before your next appointment, please call your pharmacy.

## 2016-12-23 NOTE — Progress Notes (Signed)
Thanks, Brett CanalesSteve. I'll order the Holter. [place] - what did he respond? MCr

## 2017-01-14 ENCOUNTER — Telehealth: Payer: Self-pay

## 2017-01-14 DIAGNOSIS — I472 Ventricular tachycardia, unspecified: Secondary | ICD-10-CM

## 2017-01-14 DIAGNOSIS — I493 Ventricular premature depolarization: Secondary | ICD-10-CM

## 2017-01-14 NOTE — Telephone Encounter (Signed)
Called patient. He is agreeable to wear a 24h Holter monitor. [facility]

## 2017-01-14 NOTE — Telephone Encounter (Signed)
-----   Message from Thurmon FairMihai Croitoru, MD sent at 12/23/2016 10:11 AM EST ----- Please order a 24 h Holter for VT/PVCs MCr

## 2017-02-16 ENCOUNTER — Ambulatory Visit (INDEPENDENT_AMBULATORY_CARE_PROVIDER_SITE_OTHER): Payer: Medicare Other

## 2017-02-16 DIAGNOSIS — I472 Ventricular tachycardia, unspecified: Secondary | ICD-10-CM

## 2017-02-16 DIAGNOSIS — I493 Ventricular premature depolarization: Secondary | ICD-10-CM | POA: Diagnosis not present

## 2017-03-09 ENCOUNTER — Telehealth: Payer: Self-pay

## 2017-03-09 NOTE — Telephone Encounter (Signed)
Spoke with Dr. Ladona Ridgelaylor who recommended an OV with SK at earliest available. I spoke with patient and explained the plan of care. I instructed patient to continue taking all medication as prescribed. I additionally explained the DMV driving restriction of 6months and that if he were to get an additional shock within the next 24 hours to call 911. Patient verbalized understanding.

## 2017-03-09 NOTE — Telephone Encounter (Signed)
Spoke with patient who reported that he felt his "spirit leave his body". I told him that his heart rate got fast and that his device had to shock him. He was unaware of the shock. Patient reports taking all medications as prescribed. He currently reports symptoms of diaphoresis and light headedness but states he has not had any chest pain during this time. I told him I will review this with the physician and call him back with recommendations.

## 2017-03-10 ENCOUNTER — Emergency Department (HOSPITAL_COMMUNITY): Payer: Medicare Other

## 2017-03-10 ENCOUNTER — Telehealth: Payer: Self-pay | Admitting: Cardiology

## 2017-03-10 ENCOUNTER — Encounter (HOSPITAL_COMMUNITY): Payer: Self-pay | Admitting: Emergency Medicine

## 2017-03-10 ENCOUNTER — Inpatient Hospital Stay (HOSPITAL_COMMUNITY)
Admission: EM | Admit: 2017-03-10 | Discharge: 2017-03-17 | DRG: 250 | Disposition: A | Discharge status: Home / Self Care | Payer: Medicare Other | Attending: Cardiovascular Disease | Admitting: Cardiovascular Disease

## 2017-03-10 DIAGNOSIS — Y832 Surgical operation with anastomosis, bypass or graft as the cause of abnormal reaction of the patient, or of later complication, without mention of misadventure at the time of the procedure: Secondary | ICD-10-CM | POA: Diagnosis present

## 2017-03-10 DIAGNOSIS — Z7901 Long term (current) use of anticoagulants: Secondary | ICD-10-CM

## 2017-03-10 DIAGNOSIS — Z79899 Other long term (current) drug therapy: Secondary | ICD-10-CM

## 2017-03-10 DIAGNOSIS — E785 Hyperlipidemia, unspecified: Secondary | ICD-10-CM | POA: Diagnosis present

## 2017-03-10 DIAGNOSIS — I25810 Atherosclerosis of coronary artery bypass graft(s) without angina pectoris: Secondary | ICD-10-CM | POA: Diagnosis present

## 2017-03-10 DIAGNOSIS — I2510 Atherosclerotic heart disease of native coronary artery without angina pectoris: Secondary | ICD-10-CM | POA: Diagnosis present

## 2017-03-10 DIAGNOSIS — I4901 Ventricular fibrillation: Secondary | ICD-10-CM | POA: Diagnosis present

## 2017-03-10 DIAGNOSIS — Z95810 Presence of automatic (implantable) cardiac defibrillator: Secondary | ICD-10-CM | POA: Diagnosis not present

## 2017-03-10 DIAGNOSIS — I252 Old myocardial infarction: Secondary | ICD-10-CM | POA: Diagnosis not present

## 2017-03-10 DIAGNOSIS — I4892 Unspecified atrial flutter: Secondary | ICD-10-CM | POA: Diagnosis present

## 2017-03-10 DIAGNOSIS — F17210 Nicotine dependence, cigarettes, uncomplicated: Secondary | ICD-10-CM | POA: Diagnosis present

## 2017-03-10 DIAGNOSIS — Z4502 Encounter for adjustment and management of automatic implantable cardiac defibrillator: Secondary | ICD-10-CM | POA: Diagnosis not present

## 2017-03-10 DIAGNOSIS — G4733 Obstructive sleep apnea (adult) (pediatric): Secondary | ICD-10-CM | POA: Diagnosis present

## 2017-03-10 DIAGNOSIS — E1122 Type 2 diabetes mellitus with diabetic chronic kidney disease: Secondary | ICD-10-CM | POA: Diagnosis present

## 2017-03-10 DIAGNOSIS — Z7902 Long term (current) use of antithrombotics/antiplatelets: Secondary | ICD-10-CM

## 2017-03-10 DIAGNOSIS — Z6841 Body Mass Index (BMI) 40.0 and over, adult: Secondary | ICD-10-CM

## 2017-03-10 DIAGNOSIS — I130 Hypertensive heart and chronic kidney disease with heart failure and stage 1 through stage 4 chronic kidney disease, or unspecified chronic kidney disease: Secondary | ICD-10-CM | POA: Diagnosis present

## 2017-03-10 DIAGNOSIS — I5023 Acute on chronic systolic (congestive) heart failure: Secondary | ICD-10-CM | POA: Diagnosis present

## 2017-03-10 DIAGNOSIS — Z7401 Bed confinement status: Secondary | ICD-10-CM | POA: Diagnosis not present

## 2017-03-10 DIAGNOSIS — T82857A Stenosis of cardiac prosthetic devices, implants and grafts, initial encounter: Secondary | ICD-10-CM | POA: Diagnosis present

## 2017-03-10 DIAGNOSIS — I480 Paroxysmal atrial fibrillation: Secondary | ICD-10-CM | POA: Diagnosis present

## 2017-03-10 DIAGNOSIS — Z9861 Coronary angioplasty status: Secondary | ICD-10-CM

## 2017-03-10 DIAGNOSIS — I255 Ischemic cardiomyopathy: Secondary | ICD-10-CM | POA: Diagnosis present

## 2017-03-10 DIAGNOSIS — R9431 Abnormal electrocardiogram [ECG] [EKG]: Secondary | ICD-10-CM | POA: Diagnosis not present

## 2017-03-10 DIAGNOSIS — N183 Chronic kidney disease, stage 3 (moderate): Secondary | ICD-10-CM | POA: Diagnosis present

## 2017-03-10 DIAGNOSIS — Z794 Long term (current) use of insulin: Secondary | ICD-10-CM

## 2017-03-10 DIAGNOSIS — Z951 Presence of aortocoronary bypass graft: Secondary | ICD-10-CM

## 2017-03-10 DIAGNOSIS — Z9111 Patient's noncompliance with dietary regimen: Secondary | ICD-10-CM | POA: Diagnosis not present

## 2017-03-10 DIAGNOSIS — M109 Gout, unspecified: Secondary | ICD-10-CM | POA: Diagnosis present

## 2017-03-10 DIAGNOSIS — I472 Ventricular tachycardia: Secondary | ICD-10-CM | POA: Diagnosis not present

## 2017-03-10 LAB — COMPREHENSIVE METABOLIC PANEL
ALBUMIN: 3.2 g/dL — AB (ref 3.5–5.0)
ALT: 24 U/L (ref 17–63)
AST: 33 U/L (ref 15–41)
Alkaline Phosphatase: 58 U/L (ref 38–126)
Anion gap: 9 (ref 5–15)
BILIRUBIN TOTAL: 1.2 mg/dL (ref 0.3–1.2)
BUN: 17 mg/dL (ref 6–20)
CO2: 26 mmol/L (ref 22–32)
Calcium: 9.1 mg/dL (ref 8.9–10.3)
Chloride: 102 mmol/L (ref 101–111)
Creatinine, Ser: 1.61 mg/dL — ABNORMAL HIGH (ref 0.61–1.24)
GFR calc Af Amer: 49 mL/min — ABNORMAL LOW (ref >60)
GFR calc non Af Amer: 42 mL/min — ABNORMAL LOW (ref >60)
GLUCOSE: 126 mg/dL — AB (ref 65–99)
POTASSIUM: 4.1 mmol/L (ref 3.5–5.1)
SODIUM: 137 mmol/L (ref 135–145)
TOTAL PROTEIN: 6.9 g/dL (ref 6.5–8.1)

## 2017-03-10 LAB — CBC
HEMATOCRIT: 48.9 % (ref 39.0–52.0)
HEMOGLOBIN: 15.8 g/dL (ref 13.0–17.0)
MCH: 27.2 pg (ref 26.0–34.0)
MCHC: 32.3 g/dL (ref 30.0–36.0)
MCV: 84.2 fL (ref 78.0–100.0)
Platelets: 182 10*3/uL (ref 150–400)
RBC: 5.81 MIL/uL (ref 4.22–5.81)
RDW: 16.9 % — AB (ref 11.5–15.5)
WBC: 11.8 10*3/uL — AB (ref 4.0–10.5)

## 2017-03-10 LAB — PROTIME-INR
INR: 1.21
Prothrombin Time: 15.4 seconds — ABNORMAL HIGH (ref 11.4–15.2)

## 2017-03-10 LAB — TROPONIN I: TROPONIN I: 0.06 ng/mL — AB (ref <0.03)

## 2017-03-10 LAB — MAGNESIUM: Magnesium: 2.1 mg/dL (ref 1.7–2.4)

## 2017-03-10 MED ORDER — IPRATROPIUM-ALBUTEROL 0.5-2.5 (3) MG/3ML IN SOLN
3.0000 mL | Freq: Once | RESPIRATORY_TRACT | Status: AC
  Administered 2017-03-10: 3 mL via RESPIRATORY_TRACT
  Filled 2017-03-10: qty 3

## 2017-03-10 NOTE — Telephone Encounter (Signed)
Pt's wife called stating that he had another episode like the one he had yesterday when calling the office. States he felt like his spirit was leaving his body and then passed out. Wife thinks she saw his device shock him. He was awake and alert while talking on the phone to his wife. Has an appt next, but I advised since this was his second shock he should call 911. Wife stated she would rather drive him. Advised to go to the ED for further evaluation. They were agreeable to this.   Laverda PageLindsay Roberts NP-c

## 2017-03-10 NOTE — ED Notes (Signed)
[employer] ICD interrogated. Awaiting results.

## 2017-03-10 NOTE — ED Triage Notes (Signed)
Pt arrived ems from home with c/o his AICD firing at approx 1810 today. Pt states that he is chest pain free and reports that his AICD also fired yesterday.

## 2017-03-10 NOTE — H&P (Signed)
History & Physical    Patient ID: [name] MRN: [id number], DOB/AGE: 1946/05/25   Admit date: 03/10/2017   Primary Physician: Devra Dopp, MD Primary Cardiologist: Graciela Husbands    Past Medical History    Past Medical History:  Diagnosis Date  . CAD (coronary artery disease)    a. CABG 1995 and multiple stents. b. Known graft occlusions - NSTEMI 06/2016 s/p DES to distal SVG-diag.  . Chronic systolic CHF (congestive heart failure) (HCC)   . CKD (chronic kidney disease), stage III   . Dietary noncompliance   . DM (diabetes mellitus) (HCC)   . DM type 2 causing CKD stage 3 (HCC)   . Dyslipidemia - low HDL and high triglycerides   . Gout   . Ischemic cardiomyopathy    a. LVEF 10-15% by cath 06/2016.  Marland Kitchen Leukocytosis   . Morbid obesity (HCC)   . Myocardial infarction   . NSTEMI (non-ST elevated myocardial infarction) (HCC) 06/26/2016  . PAF (paroxysmal atrial fibrillation) (HCC)   . PONV (postoperative nausea and vomiting)   . Systemic hypertension     Past Surgical History:  Procedure Laterality Date  . CARDIAC CATHETERIZATION N/A 06/28/2016   Procedure: Left Heart Cath and Cors/Grafts Angiography;  Surgeon: Lennette Bihari, MD;  Location: [facility] INVASIVE CV LAB;  Service: Cardiovascular;  Laterality: N/A;  . CARDIAC CATHETERIZATION N/A 06/29/2016   Procedure: Coronary Stent Intervention;  Surgeon: Lennette Bihari, MD;  Location: [facility] INVASIVE CV LAB;  Service: Cardiovascular;  Laterality: N/A;  . CORONARY ANGIOPLASTY WITH STENT PLACEMENT  01/12/2008   multivessel CAD occluded vein grafts to multiple sites including RCA,obtuse marginal branch & CX.  Successful PTCA and stenting distal graft.  . CORONARY ARTERY BYPASS GRAFT  1995  . EP IMPLANTABLE DEVICE N/A 05/09/2015   Procedure: Icd Implant;  Surgeon: Thurmon Fair, MD;  Location: [facility] INVASIVE CV LAB;  Service: Cardiovascular;  Laterality: N/A;  . LEFT HEART CATHETERIZATION WITH CORONARY ANGIOGRAM N/A 01/29/2015   Procedure: LEFT  HEART CATHETERIZATION WITH CORONARY ANGIOGRAM;  Surgeon: Lennette Bihari, MD;  Location: [facility] CATH LAB;  Service: Cardiovascular;  Laterality: N/A;  . TONSILLECTOMY       Allergies  Allergies  Allergen Reactions  . Coreg [Carvedilol] Nausea And Vomiting    History of Present Illness    [name] has a history of ICM, ICD discharge in 10/2016, afib with chadsvasc of 5, LVEF of 15%. He had a CABG in the past. Native vessels were occluded. His vein graft to the RCA and circumflex were occluded. There was a vein graft to his diagonal with an 80% stenosis at the anastomosis for which she subsequently underwent stenting . Today he presents after 2 ICD shocks within 24 h.   He was awake during bot episodes and lost consciousness with both. He was in bed both times. No preceding acute events that he thinks could have precipitated the events. No missed meds. Reports to have had the flu 1 m ago and that has tipped him over. He feels more SOB since then just being in bed. He denies any extra fluid gain. Mostly bed bound. No CP.  No f/c/ns today.    Home Medications    Prior to Admission medications   Medication Sig Start Date End Date Taking? Authorizing Provider  acetaminophen (TYLENOL) 325 MG tablet Take 1-2 tablets (325-650 mg total) by mouth every 4 (four) hours as needed for mild pain. 05/10/15  Yes Abelino Derrick, PA-C  allopurinol (ZYLOPRIM)  100 MG tablet Take 100 mg by mouth daily.   Yes Historical Provider, MD  atorvastatin (LIPITOR) 80 MG tablet take 1 tablet by mouth once daily Patient taking differently: TAKES 80MG  BY MOUTH ONCE DAILY 05/25/16  Yes Mihai Croitoru, MD  cholecalciferol (VITAMIN D) 1000 UNITS tablet Take 1,000 Units by mouth every other day.    Yes Historical Provider, MD  clopidogrel (PLAVIX) 75 MG tablet Take 1 tablet (75 mg total) by mouth daily. 12/14/16  Yes Mihai Croitoru, MD  ELIQUIS 5 MG TABS tablet take 1 tablet by mouth twice a day Patient taking differently: TAKES  5MG  BY MOUTH TWICE DAILY 12/14/16  Yes Mihai Croitoru, MD  furosemide (LASIX) 40 MG tablet take 1 tablet by mouth once daily Patient taking differently: TAKES 40MG  BY MOUTH ONCE DAILY 04/27/16  Yes Mihai Croitoru, MD  insulin aspart (NOVOLOG) 100 UNIT/ML injection Inject 32 Units into the skin every morning.    Yes Historical Provider, MD  INVOKANA 100 MG TABS Take 100 mg by mouth daily. 12/28/13  Yes Historical Provider, MD  LANTUS 100 UNIT/ML injection Inject 100 Units into the skin every morning.  12/28/13  Yes Historical Provider, MD  lisinopril (PRINIVIL,ZESTRIL) 10 MG tablet take 1 tablet by mouth once daily 04/27/16  Yes Mihai Croitoru, MD  nitroGLYCERIN (NITROSTAT) 0.4 MG SL tablet place 1 tablet under the tongue if needed every 5 minutes for chest pain 06/30/16  Yes Historical Provider, MD  Omega-3 Fatty Acids (FISH OIL) 1200 MG CAPS Take 1,200 mg by mouth daily.    Yes Historical Provider, MD  VICTOZA 18 MG/3ML SOPN Inject 1.2 mg into the skin daily. 06/02/16  Yes Historical Provider, MD    Family History    Family History  Problem Relation Age of Onset  . Heart failure Mother   . Dementia Mother   . Hearing loss Other   . Hypertension Other     Social History    Social History   Social History  . Marital status: Married    Spouse name: N/A  . Number of children: N/A  . Years of education: N/A   Occupational History  . Not on file.   Social History Main Topics  . Smoking status: Current Some Day Smoker    Types: Cigarettes  . Smokeless tobacco: Never Used     Comment: "1-2 cigarettes per day"  . Alcohol use 0.0 oz/week     Comment: rare  . Drug use: No  . Sexual activity: Not on file   Other Topics Concern  . Not on file   Social History Narrative  . No narrative on file     Review of Systems    General:  No chills, fever, night sweats or weight changes.  Cardiovascular:  No chest pain, dyspnea on exertion, edema, orthopnea, palpitations, paroxysmal nocturnal  dyspnea. Dermatological: No rash, lesions/masses Respiratory: No cough, dyspnea Urologic: No hematuria, dysuria Abdominal:   No nausea, vomiting, diarrhea, bright red blood per rectum, melena, or hematemesis Neurologic:  No visual changes, wkns, changes in mental status. All other systems reviewed and are otherwise negative except as noted above.  Physical Exam    Blood pressure 126/85, pulse 76, resp. rate (!) 23, height 6\' 7"  (2.007 m), weight (!) 173.3 kg (382 lb), SpO2 98 %.  General: Pleasant, NAD Psych: Normal affect. Neuro: Alert and oriented X 3. Moves all extremities spontaneously. HEENT: Normal  Neck: Supple without bruits or JVD. Lungs:  Resp regular and unlabored, CTA. Heart:  RRR no s3, s4, or murmurs. Abdomen: Soft, non-tender, non-distended, BS + x 4.  Extremities: No clubbing, cyanosis or edema. DP/PT/Radials 2+ and equal bilaterally.  Labs    Troponin (Point of Care Test) No results for input(s): TROPIPOC in the last 72 hours.  Recent Labs  03/10/17 2044  TROPONINI 0.06*   Lab Results  Component Value Date   WBC 11.8 (H) 03/10/2017   HGB 15.8 03/10/2017   HCT 48.9 03/10/2017   MCV 84.2 03/10/2017   PLT 182 03/10/2017    Recent Labs Lab 03/10/17 2044  NA 137  K 4.1  CL 102  CO2 26  BUN 17  CREATININE 1.61*  CALCIUM 9.1  PROT 6.9  BILITOT 1.2  ALKPHOS 58  ALT 24  AST 33  GLUCOSE 126*   Lab Results  Component Value Date   CHOL 111 06/25/2016   HDL 26 (L) 06/25/2016   LDLCALC 63 06/25/2016   TRIG 108 06/25/2016   No results found for: [facility]   Radiology Studies    Dg Chest 2 View  Result Date: 03/10/2017 CLINICAL DATA:  Pt c/o chronic SOB and states his pacemaker/defibullator went off twice today. Hx of CHF, DM, NSTEMI, HTN, AND AFIB. Pt is a current smoker. EXAM: CHEST - 2 VIEW COMPARISON:  06/24/2016 FINDINGS: Lungs are clear. Heart size upper limits normal.  Atheromatous aorta. No effusion. Stable left subclavian AICD.  Previous  median sternotomy and   CABG. IMPRESSION: No acute cardiopulmonary disease post CABG. Electronically Signed   By: Corlis Leak  Hassell M.D.   On: 03/10/2017 20:25    ECG & Cardiac Imaging    NSR with IVCD  Assessment & Plan    [name] has a complicated PMH with ICM, and poor vascular targets. Now with 2 ICD shocks in 24 h. His device interrogation (Medtronic) showed 2 VF episodes with rate of >300. Both aborted with shock. ON closer review of his interrogation he had optivol alert starting in Feb. Though he denies fluid retention I do think that his "flu" might have tipped his balance. He is so large that he can hide extra fluid easily even though his neck veins are flat.   Plan: - Likely a HF exacerbation leading to his VF. Will give lasix iv 40mg  BID for now.  - Would benefit from BB. He is allergic to carvedilol but I havent found contra indication to BB. This might require a discussion in the morning abut if no CI can start.    Signed, Macario GoldsMarat Alayziah Tangeman, MD 03/10/2017, 10:53 PM

## 2017-03-10 NOTE — ED Notes (Signed)
Per medtronic Pt intterogation shows 2 shocks for fast VT and an other wise regular functioning AICD

## 2017-03-10 NOTE — ED Provider Notes (Signed)
[facility]-EMERGENCY DEPT Provider Note   CSN: [id number] Arrival date & time: 03/10/17  4696     History   Chief Complaint Chief Complaint  Patient presents with  . AICD Problem    HPI [name] is a 71 y.o. male.  Pt presents to the ED today with AICD firing.  The pt said he was sitting in bed yesterday watching TV when he felt like he was going to pass out.  His wife was with him and said he could tell something was wrong right before it shocked him.  She said he was a little dazed after the shock.  It shocked him again today when he was sitting on the couch watching TV.  The pt was again dazed after the shock.  He did call his cardiologist (Dr. Royann Shivers) who advised him to come in for eval.  Pt has been sob the last few days.  He denies any cp.  He does remember feeling dizzy like he is going to pass out before he was shocked both times.      Past Medical History:  Diagnosis Date  . CAD (coronary artery disease)    a. CABG 1995 and multiple stents. b. Known graft occlusions - NSTEMI 06/2016 s/p DES to distal SVG-diag.  . Chronic systolic CHF (congestive heart failure) (HCC)   . CKD (chronic kidney disease), stage III   . Dietary noncompliance   . DM (diabetes mellitus) (HCC)   . DM type 2 causing CKD stage 3 (HCC)   . Dyslipidemia - low HDL and high triglycerides   . Gout   . Ischemic cardiomyopathy    a. LVEF 10-15% by cath 06/2016.  Marland Kitchen Leukocytosis   . Morbid obesity (HCC)   . Myocardial infarction   . NSTEMI (non-ST elevated myocardial infarction) (HCC) 06/26/2016  . PAF (paroxysmal atrial fibrillation) (HCC)   . PONV (postoperative nausea and vomiting)   . Systemic hypertension     Patient Active Problem List   Diagnosis Date Noted  . Ventricular fibrillation (HCC) 03/10/2017  . NSVT (nonsustained ventricular tachycardia) (HCC) 08/25/2016  . Dietary noncompliance 06/30/2016  . Ischemic cardiomyopathy 06/30/2016  . CKD (chronic kidney disease), stage III   .  NSTEMI (non-ST elevated myocardial infarction) (HCC) 06/26/2016  . Essential hypertension 12/03/2015  . Paroxysmal atrial fibrillation (HCC) 12/03/2015  . At risk for sudden cardiac death 2015/05/26  . AICD (automatic cardioverter/defibrillator) present 2015/05/26  . STEMI (ST elevation myocardial infarction) (HCC) 01/29/2015  . ST elevation myocardial infarction (STEMI) involving other coronary artery of anterior wall (HCC)   . Chronic kidney disease   . CAD s/p CABG 1995, graft dependent (LIMA to LAD, SVG to diagonal stented, other grafts occluded) 01/25/2014  . Cardiomyopathy, ischemic 01/25/2014  . Chronic combined systolic and diastolic CHF (congestive heart failure) (HCC) 01/25/2014  . Morbid obesity (HCC) 01/25/2014  . Diabetes mellitus due to underlying condition, controlled, with stage 3 chronic kidney disease (HCC) 01/25/2014  . Gout 01/25/2014  . Dyslipidemia - low HDL and high triglycerides 01/25/2014    Past Surgical History:  Procedure Laterality Date  . CARDIAC CATHETERIZATION N/A 06/28/2016   Procedure: Left Heart Cath and Cors/Grafts Angiography;  Surgeon: Lennette Bihari, MD;  Location: [facility] INVASIVE CV LAB;  Service: Cardiovascular;  Laterality: N/A;  . CARDIAC CATHETERIZATION N/A 06/29/2016   Procedure: Coronary Stent Intervention;  Surgeon: Lennette Bihari, MD;  Location: [facility] INVASIVE CV LAB;  Service: Cardiovascular;  Laterality: N/A;  . CORONARY ANGIOPLASTY WITH STENT  PLACEMENT  01/12/2008   multivessel CAD occluded vein grafts to multiple sites including RCA,obtuse marginal branch & CX.  Successful PTCA and stenting distal graft.  . CORONARY ARTERY BYPASS GRAFT  1995  . EP IMPLANTABLE DEVICE N/A 05/09/2015   Procedure: Icd Implant;  Surgeon: Thurmon Fair, MD;  Location: [facility] INVASIVE CV LAB;  Service: Cardiovascular;  Laterality: N/A;  . LEFT HEART CATHETERIZATION WITH CORONARY ANGIOGRAM N/A 01/29/2015   Procedure: LEFT HEART CATHETERIZATION WITH CORONARY ANGIOGRAM;   Surgeon: Lennette Bihari, MD;  Location: [facility] CATH LAB;  Service: Cardiovascular;  Laterality: N/A;  . TONSILLECTOMY         Home Medications    Prior to Admission medications   Medication Sig Start Date End Date Taking? Authorizing Provider  acetaminophen (TYLENOL) 325 MG tablet Take 1-2 tablets (325-650 mg total) by mouth every 4 (four) hours as needed for mild pain. 05/10/15  Yes Luke K Kilroy, PA-C  allopurinol (ZYLOPRIM) 100 MG tablet Take 100 mg by mouth daily.   Yes Historical Provider, MD  atorvastatin (LIPITOR) 80 MG tablet take 1 tablet by mouth once daily Patient taking differently: TAKES 80MG  BY MOUTH ONCE DAILY 05/25/16  Yes Mihai Croitoru, MD  cholecalciferol (VITAMIN D) 1000 UNITS tablet Take 1,000 Units by mouth every other day.    Yes Historical Provider, MD  clopidogrel (PLAVIX) 75 MG tablet Take 1 tablet (75 mg total) by mouth daily. 12/14/16  Yes Mihai Croitoru, MD  ELIQUIS 5 MG TABS tablet take 1 tablet by mouth twice a day Patient taking differently: TAKES 5MG  BY MOUTH TWICE DAILY 12/14/16  Yes Mihai Croitoru, MD  furosemide (LASIX) 40 MG tablet take 1 tablet by mouth once daily Patient taking differently: TAKES 40MG  BY MOUTH ONCE DAILY 04/27/16  Yes Mihai Croitoru, MD  insulin aspart (NOVOLOG) 100 UNIT/ML injection Inject 32 Units into the skin every morning.    Yes Historical Provider, MD  INVOKANA 100 MG TABS Take 100 mg by mouth daily. 12/28/13  Yes Historical Provider, MD  LANTUS 100 UNIT/ML injection Inject 100 Units into the skin every morning.  12/28/13  Yes Historical Provider, MD  lisinopril (PRINIVIL,ZESTRIL) 10 MG tablet take 1 tablet by mouth once daily 04/27/16  Yes Mihai Croitoru, MD  nitroGLYCERIN (NITROSTAT) 0.4 MG SL tablet place 1 tablet under the tongue if needed every 5 minutes for chest pain 06/30/16  Yes Historical Provider, MD  Omega-3 Fatty Acids (FISH OIL) 1200 MG CAPS Take 1,200 mg by mouth daily.    Yes Historical Provider, MD  VICTOZA 18 MG/3ML SOPN  Inject 1.2 mg into the skin daily. 06/02/16  Yes Historical Provider, MD    Family History Family History  Problem Relation Age of Onset  . Heart failure Mother   . Dementia Mother   . Hearing loss Other   . Hypertension Other     Social History Social History  Substance Use Topics  . Smoking status: Current Some Day Smoker    Types: Cigarettes  . Smokeless tobacco: Never Used     Comment: "1-2 cigarettes per day"  . Alcohol use 0.0 oz/week     Comment: rare     Allergies   Coreg [carvedilol]   Review of Systems Review of Systems  Respiratory: Positive for shortness of breath.   All other systems reviewed and are negative.    Physical Exam Updated Vital Signs BP 126/85   Pulse 76   Resp (!) 23   Ht 6\' 7"  (2.007 m)  Wt (!) 382 lb (173.3 kg)   SpO2 98%   BMI 43.03 kg/m   Physical Exam  Constitutional: He is oriented to person, place, and time. He appears well-developed and well-nourished.  HENT:  Head: Normocephalic and atraumatic.  Right Ear: External ear normal.  Left Ear: External ear normal.  Nose: Nose normal.  Mouth/Throat: Oropharynx is clear and moist.  Eyes: Conjunctivae and EOM are normal. Pupils are equal, round, and reactive to light.  Neck: Normal range of motion. Neck supple.  Cardiovascular: Normal rate, regular rhythm, normal heart sounds and intact distal pulses.   Pulmonary/Chest: Tachypnea noted. He has wheezes.  Abdominal: Soft. Bowel sounds are normal.  Musculoskeletal: Normal range of motion.  Neurological: He is alert and oriented to person, place, and time.  Skin: Skin is warm.  Psychiatric: He has a normal mood and affect. His behavior is normal. Judgment and thought content normal.  Nursing note and vitals reviewed.    ED Treatments / Results  Labs (all labs ordered are listed, but only abnormal results are displayed) Labs Reviewed  CBC - Abnormal; Notable for the following:       Result Value   WBC 11.8 (*)    RDW 16.9  (*)    All other components within normal limits  COMPREHENSIVE METABOLIC PANEL - Abnormal; Notable for the following:    Glucose, Bld 126 (*)    Creatinine, Ser 1.61 (*)    Albumin 3.2 (*)    GFR calc non Af Amer 42 (*)    GFR calc Af Amer 49 (*)    All other components within normal limits  PROTIME-INR - Abnormal; Notable for the following:    Prothrombin Time 15.4 (*)    All other components within normal limits  TROPONIN I - Abnormal; Notable for the following:    Troponin I 0.06 (*)    All other components within normal limits  MAGNESIUM  TROPONIN I  TROPONIN I  URINALYSIS, ROUTINE W REFLEX MICROSCOPIC    EKG  EKG Interpretation None       Radiology Dg Chest 2 View  Result Date: 03/10/2017 CLINICAL DATA:  Pt c/o chronic SOB and states his pacemaker/defibullator went off twice today. Hx of CHF, DM, NSTEMI, HTN, AND AFIB. Pt is a current smoker. EXAM: CHEST - 2 VIEW COMPARISON:  06/24/2016 FINDINGS: Lungs are clear. Heart size upper limits normal.  Atheromatous aorta. No effusion. Stable left subclavian AICD.  Previous median sternotomy and   CABG. IMPRESSION: No acute cardiopulmonary disease post CABG. Electronically Signed   By: Corlis Leak M.D.   On: 03/10/2017 20:25    Procedures Procedures (including critical care time)  Medications Ordered in ED Medications  ipratropium-albuterol (DUONEB) 0.5-2.5 (3) MG/3ML nebulizer solution 3 mL (3 mLs Nebulization Given 03/10/17 2030)     Initial Impression / Assessment and Plan / ED Course  I have reviewed the triage vital signs and the nursing notes.  Pertinent labs & imaging results that were available during my care of the patient were reviewed by me and considered in my medical decision making (see chart for details).    Pt d/w Dr. Royann Shivers (cardiology) who requested an interrogation of the AICD.  The pt did have 2 episodes of vf for which he was shocked.  He had 10 episodes of NSVT.    Pt d/w Dr. Clarnce Flock (cards fellow)  who will see pt and admit.  Final Clinical Impressions(s) / ED Diagnoses   Final diagnoses:  [facility]  AICD discharge    New Prescriptions New Prescriptions   No medications on file     Jacalyn LefevreJulie Drenda Sobecki, MD 03/10/17 2254

## 2017-03-11 ENCOUNTER — Other Ambulatory Visit (HOSPITAL_COMMUNITY): Payer: Medicare Other

## 2017-03-11 ENCOUNTER — Other Ambulatory Visit: Payer: Self-pay | Admitting: Internal Medicine

## 2017-03-11 DIAGNOSIS — I4901 Ventricular fibrillation: Principal | ICD-10-CM

## 2017-03-11 LAB — GLUCOSE, CAPILLARY
GLUCOSE-CAPILLARY: 126 mg/dL — AB (ref 65–99)
GLUCOSE-CAPILLARY: 138 mg/dL — AB (ref 65–99)
Glucose-Capillary: 65 mg/dL (ref 65–99)
Glucose-Capillary: 100 mg/dL — ABNORMAL HIGH (ref 65–99)
Glucose-Capillary: 165 mg/dL — ABNORMAL HIGH (ref 65–99)

## 2017-03-11 LAB — TROPONIN I: TROPONIN I: 0.07 ng/mL — AB (ref <0.03)

## 2017-03-11 LAB — URINALYSIS, ROUTINE W REFLEX MICROSCOPIC
Bacteria, UA: NONE SEEN
Bilirubin Urine: NEGATIVE
Glucose, UA: >=500 mg/dL — AB
KETONES UR: NEGATIVE mg/dL
Leukocytes, UA: NEGATIVE
Nitrite: NEGATIVE
PH: 5 (ref 5.0–8.0)
Protein, ur: 100 mg/dL — AB
SPECIFIC GRAVITY, URINE: 1.024 (ref 1.005–1.030)

## 2017-03-11 LAB — BASIC METABOLIC PANEL
ANION GAP: 10 (ref 5–15)
BUN: 20 mg/dL (ref 6–20)
CHLORIDE: 101 mmol/L (ref 101–111)
CO2: 25 mmol/L (ref 22–32)
Calcium: 8.9 mg/dL (ref 8.9–10.3)
Creatinine, Ser: 1.59 mg/dL — ABNORMAL HIGH (ref 0.61–1.24)
GFR calc Af Amer: 49 mL/min — ABNORMAL LOW (ref >60)
GFR calc non Af Amer: 43 mL/min — ABNORMAL LOW (ref >60)
Glucose, Bld: 148 mg/dL — ABNORMAL HIGH (ref 65–99)
POTASSIUM: 3.9 mmol/L (ref 3.5–5.1)
SODIUM: 136 mmol/L (ref 135–145)

## 2017-03-11 LAB — APTT: APTT: 38 s — AB (ref 24–36)

## 2017-03-11 LAB — BRAIN NATRIURETIC PEPTIDE: B Natriuretic Peptide: 296.1 pg/mL — ABNORMAL HIGH (ref 0.0–100.0)

## 2017-03-11 LAB — HEPARIN LEVEL (UNFRACTIONATED)

## 2017-03-11 LAB — MRSA PCR SCREENING: MRSA BY PCR: NEGATIVE

## 2017-03-11 MED ORDER — CANAGLIFLOZIN 100 MG PO TABS
100.0000 mg | ORAL_TABLET | Freq: Every day | ORAL | Status: DC
  Administered 2017-03-11 – 2017-03-17 (×6): 100 mg via ORAL
  Filled 2017-03-11 (×9): qty 1

## 2017-03-11 MED ORDER — INSULIN GLARGINE 100 UNIT/ML ~~LOC~~ SOLN
100.0000 [IU] | Freq: Every morning | SUBCUTANEOUS | Status: DC
  Administered 2017-03-11 – 2017-03-15 (×5): 100 [IU] via SUBCUTANEOUS
  Filled 2017-03-11 (×7): qty 1

## 2017-03-11 MED ORDER — FUROSEMIDE 10 MG/ML IJ SOLN
40.0000 mg | Freq: Two times a day (BID) | INTRAMUSCULAR | Status: DC
  Administered 2017-03-11 – 2017-03-14 (×7): 40 mg via INTRAVENOUS
  Filled 2017-03-11 (×7): qty 4

## 2017-03-11 MED ORDER — ALLOPURINOL 100 MG PO TABS
100.0000 mg | ORAL_TABLET | Freq: Every day | ORAL | Status: DC
  Administered 2017-03-11 – 2017-03-17 (×7): 100 mg via ORAL
  Filled 2017-03-11 (×7): qty 1

## 2017-03-11 MED ORDER — AMIODARONE HCL 200 MG PO TABS
400.0000 mg | ORAL_TABLET | Freq: Two times a day (BID) | ORAL | Status: DC
  Administered 2017-03-11: 400 mg via ORAL
  Filled 2017-03-11: qty 2

## 2017-03-11 MED ORDER — SODIUM CHLORIDE 0.9% FLUSH: 3.0000 mL | INTRAVENOUS | Status: DC | PRN

## 2017-03-11 MED ORDER — APIXABAN 5 MG PO TABS
5.0000 mg | ORAL_TABLET | Freq: Two times a day (BID) | ORAL | Status: DC
  Administered 2017-03-11 (×2): 5 mg via ORAL
  Filled 2017-03-11 (×2): qty 1

## 2017-03-11 MED ORDER — ATORVASTATIN CALCIUM 80 MG PO TABS
80.0000 mg | ORAL_TABLET | Freq: Every day | ORAL | Status: DC
  Administered 2017-03-11 – 2017-03-17 (×7): 80 mg via ORAL
  Filled 2017-03-11 (×7): qty 1

## 2017-03-11 MED ORDER — ACETAMINOPHEN 325 MG PO TABS
650.0000 mg | ORAL_TABLET | ORAL | Status: DC | PRN
  Administered 2017-03-11 – 2017-03-12 (×2): 650 mg via ORAL
  Filled 2017-03-11 (×2): qty 2

## 2017-03-11 MED ORDER — SODIUM CHLORIDE 0.9% FLUSH
3.0000 mL | Freq: Two times a day (BID) | INTRAVENOUS | Status: DC
  Administered 2017-03-11 – 2017-03-16 (×8): 3 mL via INTRAVENOUS

## 2017-03-11 MED ORDER — CLOPIDOGREL BISULFATE 75 MG PO TABS
75.0000 mg | ORAL_TABLET | Freq: Every day | ORAL | Status: DC
  Administered 2017-03-11 – 2017-03-16 (×6): 75 mg via ORAL
  Filled 2017-03-11 (×5): qty 1

## 2017-03-11 MED ORDER — AMIODARONE LOAD VIA INFUSION
150.0000 mg | Freq: Once | INTRAVENOUS | Status: AC
  Administered 2017-03-11: 150 mg via INTRAVENOUS
  Filled 2017-03-11: qty 83.34

## 2017-03-11 MED ORDER — METOPROLOL SUCCINATE ER 50 MG PO TB24
50.0000 mg | ORAL_TABLET | Freq: Every day | ORAL | Status: DC
  Administered 2017-03-11 – 2017-03-17 (×7): 50 mg via ORAL
  Filled 2017-03-11 (×7): qty 1

## 2017-03-11 MED ORDER — TRAMADOL HCL 50 MG PO TABS
25.0000 mg | ORAL_TABLET | Freq: Four times a day (QID) | ORAL | Status: DC | PRN
  Administered 2017-03-11: 25 mg via ORAL
  Filled 2017-03-11: qty 1

## 2017-03-11 MED ORDER — AMIODARONE HCL IN DEXTROSE 360-4.14 MG/200ML-% IV SOLN
INTRAVENOUS | Status: AC
  Administered 2017-03-11: 60 mg/h
  Filled 2017-03-11: qty 200

## 2017-03-11 MED ORDER — AMIODARONE HCL IN DEXTROSE 360-4.14 MG/200ML-% IV SOLN
60.0000 mg/h | INTRAVENOUS | Status: AC
  Administered 2017-03-11: 60 mg/h via INTRAVENOUS
  Filled 2017-03-11: qty 200

## 2017-03-11 MED ORDER — DEXTROSE 50 % IV SOLN
INTRAVENOUS | Status: AC
  Administered 2017-03-11: 50 mL
  Filled 2017-03-11: qty 50

## 2017-03-11 MED ORDER — INSULIN ASPART 100 UNIT/ML ~~LOC~~ SOLN
32.0000 [IU] | Freq: Every day | SUBCUTANEOUS | Status: DC
  Administered 2017-03-11 – 2017-03-15 (×2): 32 [IU] via SUBCUTANEOUS

## 2017-03-11 MED ORDER — LISINOPRIL 10 MG PO TABS
10.0000 mg | ORAL_TABLET | Freq: Every day | ORAL | Status: DC
  Administered 2017-03-11 – 2017-03-17 (×7): 10 mg via ORAL
  Filled 2017-03-11 (×7): qty 1

## 2017-03-11 MED ORDER — NITROGLYCERIN 0.4 MG SL SUBL: 0.4000 mg | SUBLINGUAL_TABLET | SUBLINGUAL | Status: DC | PRN

## 2017-03-11 MED ORDER — ONDANSETRON HCL 4 MG/2ML IJ SOLN
4.0000 mg | Freq: Four times a day (QID) | INTRAMUSCULAR | Status: DC | PRN
  Administered 2017-03-11 – 2017-03-13 (×2): 4 mg via INTRAVENOUS
  Filled 2017-03-11 (×2): qty 2

## 2017-03-11 MED ORDER — LORAZEPAM 0.5 MG PO TABS
0.5000 mg | ORAL_TABLET | Freq: Four times a day (QID) | ORAL | Status: DC | PRN
  Administered 2017-03-12 – 2017-03-15 (×3): 0.5 mg via ORAL
  Filled 2017-03-11 (×3): qty 1

## 2017-03-11 MED ORDER — AMIODARONE HCL IN DEXTROSE 360-4.14 MG/200ML-% IV SOLN
30.0000 mg/h | INTRAVENOUS | Status: DC
  Administered 2017-03-11 – 2017-03-13 (×3): 30 mg/h via INTRAVENOUS
  Filled 2017-03-11 (×4): qty 200

## 2017-03-11 MED ORDER — SODIUM CHLORIDE 0.9 % IV SOLN: 250.0000 mL | INTRAVENOUS | Status: DC | PRN

## 2017-03-11 MED ORDER — ACETAMINOPHEN 325 MG PO TABS: 325.0000 mg | ORAL_TABLET | ORAL | Status: DC | PRN

## 2017-03-11 MED ORDER — HEPARIN (PORCINE) IN NACL 100-0.45 UNIT/ML-% IJ SOLN
1200.0000 [IU]/h | INTRAMUSCULAR | Status: DC
  Administered 2017-03-11: 1400 [IU]/h via INTRAVENOUS
  Administered 2017-03-13: 1200 [IU]/h via INTRAVENOUS
  Filled 2017-03-11 (×3): qty 250

## 2017-03-11 MED ORDER — LIRAGLUTIDE 18 MG/3ML ~~LOC~~ SOPN
1.2000 mg | PEN_INJECTOR | Freq: Every day | SUBCUTANEOUS | Status: DC
  Administered 2017-03-11 – 2017-03-17 (×5): 1.2 mg via SUBCUTANEOUS
  Filled 2017-03-11 (×4): qty 3

## 2017-03-11 NOTE — Progress Notes (Signed)
Inpatient Diabetes Program Recommendations  AACE/ADA: New Consensus Statement on Inpatient Glycemic Control (2015)  Target Ranges:  Prepandial:   less than 140 mg/dL      Peak postprandial:   less than 180 mg/dL (1-2 hours)      Critically ill patients:  140 - 180 mg/dL   Lab Results  Component Value Date   GLUCAP 100 (H) 03/11/2017   HGBA1C 6.0 (H) 06/24/2016    Review of Glycemic Control  Diabetes history: DM2 Outpatient Diabetes medications: Novolog 32 units QAM, Lantus 100 units QAM, Invokana 100 units QAM, Victoza 1.8 mg QD Current orders for Inpatient glycemic control: Same as above  Hypoglycemia this am. Likely d/t Novolog 32 units QAM  Inpatient Diabetes Program Recommendations:    Reduce Novolog to 20 units QAM. Need updated HgbA1C to assess glycemic control prior to hospitalization.  Will follow. Thank you. Ailene Ardshonda Rainee Sweatt, RD, LDN, CDE Inpatient Diabetes Coordinator [phone]

## 2017-03-11 NOTE — Progress Notes (Signed)
Physician notified: Camnitz At: 1035  Regarding: Pt sleeping on R side, had run of VT, AICD fired while Charity fundraiserN running into room.  Now afib with PVC  Awaiting return response.   Returned Response at: 1037  Order(s): start amio gtt with bolus.

## 2017-03-11 NOTE — Care Management Note (Addendum)
Case Management Note  Patient Details  Name: [name] MRN: [id number] Date of Birth: 10-Jan-1946  Subjective/Objective:    From home with wife, presents with 2 ICD shocks within 24 hrs, he was awake during both episodes and lost consciousness with both. Patient is sedentary at home per wife.  Patient states he does not want HH services and does not want a pt eval.     NCM will cont to follow for dc needs.    3/26 1406 Letha Capeeborah Jaquavis Felmlee RN, BSN - s/p heart cath, on plavix, NCM will cont to follow for dc needs.              Action/Plan:   Expected Discharge Date:  03/12/17               Expected Discharge Plan:  Home w Home Health Services  In-House Referral:     Discharge planning Services  CM Consult  Post Acute Care Choice:    Choice offered to:     DME Arranged:    DME Agency:     HH Arranged:    HH Agency:     Status of Service:  In process, will continue to follow  If discussed at Long Length of Stay Meetings, dates discussed:    Additional Comments:  [name], [name], RN 03/11/2017, 4:37 PM

## 2017-03-11 NOTE — Consult Note (Addendum)
ELECTROPHYSIOLOGY CONSULT NOTE    Patient ID: [name] MRN: [id number], DOB/AGE: Jun 19, 1946 71 y.o.  Admit date: 03/10/2017 Date of Consult: 03/11/2017   Primary Physician: Devra Dopp, MD Primary Cardiologist: Dr. Royann Shivers Electrophysiolgist: Dr. Graciela Husbands  Reason for Consultation: VT/ICD shocks  HPI: [name] is a 71 y.o. male with PMHx of severe CAD, most recent intervention July 2017, severe ICM w/ICD, morbid obesity, strong likelyhood of OSA but patient unwilling to get evaluated, chronic CHF (systolic), DM, HTN, HLD, CRI (stage III), reported dietary noncompliance comes to the hospital after receiving 2 shocks from his device.  He reports feeling well of late, since his last cath in July has not had any kind of CP, palpitations, no dizziness, near syncope or syncope.  He denies any noted edema, rapid weight gain or change to his baseline exertional capacity, but has a very sedentary life without much physical activity at all.    He reports the day prior to coming in and the event yesterday that prompted him to come, he was laying in bed, felt suddenly dizzy and was shocked.  No other symptoms.  Device information: MDT dual chamber ICD, implanted 2016  Past Medical History:  Diagnosis Date  . CAD (coronary artery disease)    a. CABG 1995 and multiple stents. b. Known graft occlusions - NSTEMI 06/2016 s/p DES to distal SVG-diag.  . Chronic systolic CHF (congestive heart failure) (HCC)   . CKD (chronic kidney disease), stage III   . Dietary noncompliance   . DM (diabetes mellitus) (HCC)   . DM type 2 causing CKD stage 3 (HCC)   . Dyslipidemia - low HDL and high triglycerides   . Gout   . Ischemic cardiomyopathy    a. LVEF 10-15% by cath 06/2016.  Marland Kitchen Leukocytosis   . Morbid obesity (HCC)   . Myocardial infarction   . NSTEMI (non-ST elevated myocardial infarction) (HCC) 06/26/2016  . PAF (paroxysmal atrial fibrillation) (HCC)   . PONV (postoperative nausea and  vomiting)   . Systemic hypertension      Surgical History:  Past Surgical History:  Procedure Laterality Date  . CARDIAC CATHETERIZATION N/A 06/28/2016   Procedure: Left Heart Cath and Cors/Grafts Angiography;  Surgeon: Lennette Bihari, MD;  Location: [facility] INVASIVE CV LAB;  Service: Cardiovascular;  Laterality: N/A;  . CARDIAC CATHETERIZATION N/A 06/29/2016   Procedure: Coronary Stent Intervention;  Surgeon: Lennette Bihari, MD;  Location: [facility] INVASIVE CV LAB;  Service: Cardiovascular;  Laterality: N/A;  . CORONARY ANGIOPLASTY WITH STENT PLACEMENT  01/12/2008   multivessel CAD occluded vein grafts to multiple sites including RCA,obtuse marginal branch & CX.  Successful PTCA and stenting distal graft.  . CORONARY ARTERY BYPASS GRAFT  1995  . EP IMPLANTABLE DEVICE N/A 05/09/2015   Procedure: Icd Implant;  Surgeon: Thurmon Fair, MD;  Location: [facility] INVASIVE CV LAB;  Service: Cardiovascular;  Laterality: N/A;  . LEFT HEART CATHETERIZATION WITH CORONARY ANGIOGRAM N/A 01/29/2015   Procedure: LEFT HEART CATHETERIZATION WITH CORONARY ANGIOGRAM;  Surgeon: Lennette Bihari, MD;  Location: [facility] CATH LAB;  Service: Cardiovascular;  Laterality: N/A;  . TONSILLECTOMY       Prescriptions Prior to Admission  Medication Sig Dispense Refill Last Dose  . acetaminophen (TYLENOL) 325 MG tablet Take 1-2 tablets (325-650 mg total) by mouth every 4 (four) hours as needed for mild pain.   Past Month at Unknown time  . allopurinol (ZYLOPRIM) 100 MG tablet Take 100 mg by mouth daily.  03/10/2017 at Unknown time  . atorvastatin (LIPITOR) 80 MG tablet take 1 tablet by mouth once daily (Patient taking differently: TAKES 80MG  BY MOUTH ONCE DAILY) 90 tablet 3 03/10/2017 at Unknown time  . cholecalciferol (VITAMIN D) 1000 UNITS tablet Take 1,000 Units by mouth every other day.    03/10/2017 at Unknown time  . clopidogrel (PLAVIX) 75 MG tablet Take 1 tablet (75 mg total) by mouth daily. 30 tablet 2 03/10/2017 at Unknown time  . ELIQUIS 5  MG TABS tablet take 1 tablet by mouth twice a day (Patient taking differently: TAKES 5MG  BY MOUTH TWICE DAILY) 60 tablet 2 03/10/2017 at 1000  . furosemide (LASIX) 40 MG tablet take 1 tablet by mouth once daily (Patient taking differently: TAKES 40MG  BY MOUTH ONCE DAILY) 90 tablet 3 03/10/2017 at Unknown time  . insulin aspart (NOVOLOG) 100 UNIT/ML injection Inject 32 Units into the skin every morning.    03/10/2017 at Unknown time  . INVOKANA 100 MG TABS Take 100 mg by mouth daily.   03/10/2017 at Unknown time  . LANTUS 100 UNIT/ML injection Inject 100 Units into the skin every morning.    03/10/2017 at Unknown time  . lisinopril (PRINIVIL,ZESTRIL) 10 MG tablet take 1 tablet by mouth once daily 90 tablet 3 03/10/2017 at Unknown time  . nitroGLYCERIN (NITROSTAT) 0.4 MG SL tablet place 1 tablet under the tongue if needed every 5 minutes for chest pain  0 PRN  . Omega-3 Fatty Acids (FISH OIL) 1200 MG CAPS Take 1,200 mg by mouth daily.    03/10/2017 at Unknown time  . VICTOZA 18 MG/3ML SOPN Inject 1.2 mg into the skin daily.  0 03/10/2017 at Unknown time    Inpatient Medications:  . allopurinol  100 mg Oral Daily  . amiodarone  400 mg Oral BID  . apixaban  5 mg Oral BID  . atorvastatin  80 mg Oral Daily  . canagliflozin  100 mg Oral Daily  . clopidogrel  75 mg Oral Daily  . furosemide  40 mg Intravenous BID  . insulin aspart  32 Units Subcutaneous Q breakfast  . insulin glargine  100 Units Subcutaneous q morning - 10a  . liraglutide  1.2 mg Subcutaneous Daily  . lisinopril  10 mg Oral Daily  . metoprolol succinate  50 mg Oral Daily  . sodium chloride flush  3 mL Intravenous Q12H    Allergies:  Allergies  Allergen Reactions  . Coreg [Carvedilol] Nausea And Vomiting    Social History   Social History  . Marital status: Married    Spouse name: N/A  . Number of children: N/A  . Years of education: N/A   Occupational History  . Not on file.   Social History Main Topics  . Smoking status:  Current Some Day Smoker    Types: Cigarettes  . Smokeless tobacco: Never Used     Comment: "1-2 cigarettes per day"  . Alcohol use 0.0 oz/week     Comment: rare  . Drug use: No  . Sexual activity: Not on file   Other Topics Concern  . Not on file   Social History Narrative  . No narrative on file     Family History  Problem Relation Age of Onset  . Heart failure Mother   . Dementia Mother   . Hearing loss Other   . Hypertension Other      Review of Systems: All other systems reviewed and are otherwise negative except as noted above.  Physical Exam: Vitals:   03/11/17 0341 03/11/17 0700 03/11/17 0950 03/11/17 1036  BP:   (!) 153/100 (!) 171/129  Pulse:   76 73  Resp:   (!) 23 (!) 21  Temp:  97.7 F (36.5 C)    TempSrc:  Oral    SpO2:   97% 96%  Weight: (!) 373 lb 9.6 oz (169.5 kg)     Height:        GEN- The patient morbidly obese, is well appearing, alert and oriented x 3 today, anxious post-shock  HEENT: normocephalic, atraumatic; sclera clear, conjunctiva pink; hearing intact; oropharynx clear; neck supple, no JVP Lymph- no cervical lymphadenopathy Lungs- CTA b/l, normal work of breathing.  No wheezes, rales, rhonchi Heart- IRRR, no murmurs, rubs or gallops, PMI not laterally displaced GI- soft, non-tender, non-distended, obese Extremities- no clubbing, cyanosis, or edema MS- no significant deformity or atrophy Skin- warm and dry, no rash or lesion Psych- euthymic mood, full affect Neuro- no gross deficits observed  Labs:   Lab Results  Component Value Date   WBC 11.8 (H) 03/10/2017   HGB 15.8 03/10/2017   HCT 48.9 03/10/2017   MCV 84.2 03/10/2017   PLT 182 03/10/2017    Recent Labs Lab 03/10/17 2044 03/11/17 0148  NA 137 136  K 4.1 3.9  CL 102 101  CO2 26 25  BUN 17 20  CREATININE 1.61* 1.59*  CALCIUM 9.1 8.9  PROT 6.9  --   BILITOT 1.2  --   ALKPHOS 58  --   ALT 24  --   AST 33  --   GLUCOSE 126* 148*      Radiology/Studies: Dg  Chest 2 View  Result Date: 03/10/2017 CLINICAL DATA:  Pt c/o chronic SOB and states his pacemaker/defibullator went off twice today. Hx of CHF, DM, NSTEMI, HTN, AND AFIB. Pt is a current smoker. EXAM: CHEST - 2 VIEW COMPARISON:  06/24/2016 FINDINGS: Lungs are clear. Heart size upper limits normal.  Atheromatous aorta. No effusion. Stable left subclavian AICD.  Previous median sternotomy and   CABG. IMPRESSION: No acute cardiopulmonary disease post CABG. Electronically Signed   By: Corlis Leak M.D.   On: 03/10/2017 20:25    EKG: SR, PVC's, appears unchanged from last, no ischemic changes TELEMETRY: SR >> fast VT >> shock>> AFlutter, 90's with occassional PVC's  06/28/16: LHC Conclusion   Ost LAD lesion, 100% stenosed.  Prox Cx lesion, 100% stenosed.  Mid RCA lesion, 100% stenosed.  LIMA was injected is normal in caliber, and is anatomically normal.  Prox LAD to Dist LAD lesion, 100% stenosed.  Dist LAD lesion, 99% stenosed.  SVG was injected .  Origin lesion, 100% stenosed.  SVG was injected .  Origin lesion, 100% stenosed.  SVG was injected .  Dist Graft lesion, 80% stenosed.  There is severe left ventricular systolic dysfunction.  RECOMMENDATION: The patient is not a candidate for redo surgical revascularization.  Angiograms Jovan Schickling be reviewed with colleagues with consideration for attempt at opening the 80% distal SVG to diagonal stenosis.  He Eboney Claybrook need diuresis post procedure.  06/29/16: coronary intervention Successful percutaneous coronary intervention involving an 85% distal SVG stenosis beyond a previously placed 2.538 mm Promus premier stent treated with PTCA/ DES stenting requiring a buddy wire technique with insertion of a 2.2512 mm Synergy DES stent dilated to 2.38 mm.  The 85% stenosis was reduced to less than 10%.  There was brisk TIMI-3 flow. There is no evidence for dissection. RECOMMENDATION: The  patient should stay on long-term antiplatelet therapy  indefinitely.  He has a severe ischemic cardiomyopathy with an ejection fraction less than 15% and is not felt to be a candidate for surgical revascularization.    Assessment and Plan:   1. ICD shocks     Appropriate for fast VT [facility] reviewed, 03/09/17 and 03/10/17 once each day, fast VT failed ATP with successful shocks x1 each event, and again today  I Ej Pinson ask his device be checked here  The patient is not while here or of late having any kind of CP or anginal symptoms.  He has been feeling quite well, no DOE, or rest SOB either.  His EKG appears unchanged from last without ischemic changes, and troponins only minimally elevated and likely secondary to shocks/VT, and likely secondary to his very severe ICM. His last cath/intervention as noted above.  The patient was seen and examined early this morning by Dr. Elberta Fortisamnitz and PO amiodarone load and BB were initiated, he had an additional VT episode with failed ATP/successful shock x1.  IV amiodarone bolus/load infusion ordered and already initiated.  He is somewhat anxious and c/o pain at his device site, Jacilyn Sanpedro order ativan and tramadol for him PRN.  The patient is in a step-down bed currently, no need for transfer at this time  The patient has been informed of [place] law, no driving for 6 months  2. CAD    Low suspicion of acute ischemic event Dr. Elberta Fortisamnitz has reviewed the case with interventional, Shamon Lobo plan for cath Monday, stop his Eliquis and use heparin gtt given AFlutter currently.     BB added (reported nausea/vomiting episode after coreg once), not true allergy, continue plavix, statin  3. PAFib/flutter     CHA2DS2Vasc is 5, on Eliquis  4. HTN     Follow with current meds  5. CRI     Appears at his baseline  6. ICM     LVEF 10-15% by LV gram at his cath last year     Exam/CXR without evidence of fluid OL     Continue BB/ACE  7. DM     Continue current   Signed, Francis DowseRenee Ursuy, PA-C 03/11/2017 10:48 AM    I have  seen and examined this patient with Francis Dowseenee Ursuy.  Agree with above, note added to reflect my findings.  On exam, regular rhythm, no murmurs, lungs clear. Has a history of ischemic cardiomyopathy with an EF of around 15% and is status post CABG with a stents to the graft to the diagonal in July 2017. He presented to the hospital with ICD shocks that were appropriate. He has had 3 shocks in the last 36 hours. His most recent shock was today while in bed sleeping. All of which have been for ventricular tachycardia that failed to convert with ATP. He Thai Hemrick be loaded on IV amiodarone today, and potentially switch to by mouth 24 hours after the IV load. He has had one dose of Eliquis today. Due to his significant coronary artery disease and graft disease in the past, Ursula Dermody plan left heart catheterization with possible stenting on Monday to allow for his Eliquis to wear off. Should he continue to have ventricular tachycardia, continued IV amiodarone versus lidocaine may be indicated.    Sumayya Muha M. Osie Amparo MD 03/11/2017 12:17 PM    03/11/17: NOTE AMMENDED ONLY TO CORRECT DATE OF LAST CATH/INTERVENTION TO LAST YEAR 2017 Francis Dowseenee Ursuy, PA-C

## 2017-03-11 NOTE — Progress Notes (Signed)
Hypoglycemic Event  CBG: 65  Treatment: 15 GM carbohydrate snack, pt then became nauseous. Gave 1/2 amp dextrose per IV and zofran. Continue to monitor.   Symptoms: Sweaty and Shaky  Follow-up CBG: Time:1228 CBG Result:100  Possible Reasons for Event: Change in activity, pt had episode of VT with AICD shock. Family states his BG drops after the last two AICD fires.     Ozella RocksSchmitz, Freddie Nghiem P

## 2017-03-11 NOTE — Progress Notes (Signed)
ANTICOAGULATION CONSULT NOTE - Initial Consult  Pharmacy Consult for heparin Indication: atrial fibrillation  Allergies  Allergen Reactions  . Coreg [Carvedilol] Nausea And Vomiting    Patient Measurements: Height: 6' 7.5" (201.9 cm) Weight: (!) 373 lb 9.6 oz (169.5 kg) IBW/kg (Calculated) : 94.85 Heparin Dosing Weight: 133 kg  Vital Signs: Temp: 98.3 F (36.8 C) (03/23 1100) Temp Source: Oral (03/23 1100) BP: 171/129 (03/23 1036) Pulse Rate: 73 (03/23 1036)  Labs:  Recent Labs  03/10/17 2044 03/11/17 0148  HGB 15.8  --   HCT 48.9  --   PLT 182  --   LABPROT 15.4*  --   INR 1.21  --   CREATININE 1.61* 1.59*  TROPONINI 0.06* 0.07*    Estimated Creatinine Clearance: 77.3 mL/min (A) (by C-G formula based on SCr of 1.59 mg/dL (H)).   Medical History: Past Medical History:  Diagnosis Date  . CAD (coronary artery disease)    a. CABG 1995 and multiple stents. b. Known graft occlusions - NSTEMI 06/2016 s/p DES to distal SVG-diag.  . Chronic systolic CHF (congestive heart failure) (HCC)   . CKD (chronic kidney disease), stage III   . Dietary noncompliance   . DM (diabetes mellitus) (HCC)   . DM type 2 causing CKD stage 3 (HCC)   . Dyslipidemia - low HDL and high triglycerides   . Gout   . Ischemic cardiomyopathy    a. LVEF 10-15% by cath 06/2016.  Marland Kitchen. Leukocytosis   . Morbid obesity (HCC)   . Myocardial infarction   . NSTEMI (non-ST elevated myocardial infarction) (HCC) 06/26/2016  . PAF (paroxysmal atrial fibrillation) (HCC)   . PONV (postoperative nausea and vomiting)   . Systemic hypertension     Assessment: 89 yom presented to the ED with SOB and VT with ICD shocks. He is on chronic apixaban for history of afib. Baseline H/H and platelets are WNL. Pt last took his apixaban this morning at 0945. He will be transitioned to IV heparin in case he needs cardiac cath.    Goal of Therapy:  Heparin level 0.3-0.7 units/ml aPTT 66-102 seconds Monitor platelets by  anticoagulation protocol: Yes   Plan:  - Check a baseline heparin level and aPTT - Heparin gtt 1400 units/hr starting tonight at 22:00 (when his next dose of apixaban would have been due) - Check an 8 hr heparin level and aPTT - Daily heparin level, aPTT and CBC  Pollyann SamplesAndy Bladen Umar, PharmD, BCPS 03/11/2017, 12:22 PM

## 2017-03-12 LAB — BASIC METABOLIC PANEL
Anion gap: 11 (ref 5–15)
BUN: 19 mg/dL (ref 6–20)
CALCIUM: 9 mg/dL (ref 8.9–10.3)
CO2: 24 mmol/L (ref 22–32)
Chloride: 102 mmol/L (ref 101–111)
Creatinine, Ser: 1.55 mg/dL — ABNORMAL HIGH (ref 0.61–1.24)
GFR calc Af Amer: 51 mL/min — ABNORMAL LOW (ref >60)
GFR, EST NON AFRICAN AMERICAN: 44 mL/min — AB (ref >60)
Glucose, Bld: 83 mg/dL (ref 65–99)
Potassium: 4.5 mmol/L (ref 3.5–5.1)
SODIUM: 137 mmol/L (ref 135–145)

## 2017-03-12 LAB — GLUCOSE, CAPILLARY
GLUCOSE-CAPILLARY: 109 mg/dL — AB (ref 65–99)
GLUCOSE-CAPILLARY: 91 mg/dL (ref 65–99)
Glucose-Capillary: 96 mg/dL (ref 65–99)
Glucose-Capillary: 88 mg/dL (ref 65–99)

## 2017-03-12 LAB — APTT
APTT: 77 s — AB (ref 24–36)
aPTT: 112 seconds — ABNORMAL HIGH (ref 24–36)
aPTT: 95 seconds — ABNORMAL HIGH (ref 24–36)

## 2017-03-12 LAB — CBC
HCT: 47.8 % (ref 39.0–52.0)
Hemoglobin: 15 g/dL (ref 13.0–17.0)
MCH: 26.8 pg (ref 26.0–34.0)
MCHC: 31.4 g/dL (ref 30.0–36.0)
MCV: 85.4 fL (ref 78.0–100.0)
Platelets: 199 10*3/uL (ref 150–400)
RBC: 5.6 MIL/uL (ref 4.22–5.81)
RDW: 17 % — AB (ref 11.5–15.5)
WBC: 12.6 10*3/uL — AB (ref 4.0–10.5)

## 2017-03-12 LAB — HEPARIN LEVEL (UNFRACTIONATED): Heparin Unfractionated: 1.3 IU/mL — ABNORMAL HIGH (ref 0.30–0.70)

## 2017-03-12 NOTE — Progress Notes (Signed)
ANTICOAGULATION CONSULT NOTE - Follow-up Consult  Pharmacy Consult for heparin Indication: atrial fibrillation  Allergies  Allergen Reactions  . Coreg [Carvedilol] Nausea And Vomiting    Patient Measurements: Height: 6' 7.5" (201.9 cm) Weight: (!) 373 lb 9.6 oz (169.5 kg) IBW/kg (Calculated) : 94.85 Heparin Dosing Weight: 133 kg  Vital Signs: Temp: 98.1 F (36.7 C) (03/24 0402) Temp Source: Oral (03/24 0402) BP: 138/80 (03/24 0402) Pulse Rate: 68 (03/24 0402)  Labs:  Recent Labs  03/10/17 2044 03/11/17 0148 03/11/17 1434 03/12/17 0241  HGB 15.8  --   --   --   HCT 48.9  --   --   --   PLT 182  --   --   --   APTT  --   --  38* 77*  LABPROT 15.4*  --   --   --   INR 1.21  --   --   --   HEPARINUNFRC  --   --  >2.20*  --   CREATININE 1.61* 1.59*  --   --   TROPONINI 0.06* 0.07*  --   --     Estimated Creatinine Clearance: 77.3 mL/min (A) (by C-G formula based on SCr of 1.59 mg/dL (H)).  Assessment: [age] yom presented to the ED with SOB and VT with ICD shocks. He is on chronic apixaban for history of afib - last dose 3/23 0945. Pt on IV heparin in case he needs cardiac cath. Utilizing PTT to monitor heparin for now until heparin levels and PTT correlating (apixaban affects heparin level).   PTT 77 sec (therapeutic) on gtt at 1400 units/hr. No bleeding noted.  Goal of Therapy:  Heparin level 0.3-0.7 units/ml aPTT 66-102 seconds Monitor platelets by anticoagulation protocol: Yes   Plan:  - Continue heparin gtt 1400 units/hr  - Check a 6hr confirmatory PTT  Christoper Fabianaron Mersedes Alber, PharmD, BCPS Clinical pharmacist, pager [phone] 03/12/2017, 4:34 AM

## 2017-03-12 NOTE — Progress Notes (Signed)
ANTICOAGULATION CONSULT NOTE - Follow-up Consult  Pharmacy Consult for heparin Indication: atrial fibrillation  Allergies  Allergen Reactions  . Coreg [Carvedilol] Nausea And Vomiting    Patient Measurements: Height: 6' 7.5" (201.9 cm) Weight: (!) 373 lb 3.8 oz (169.3 kg) IBW/kg (Calculated) : 94.85 Heparin Dosing Weight: 133 kg  Vital Signs: Temp: 97.7 F (36.5 C) (03/24 1214) Temp Source: Oral (03/24 1214) BP: 140/82 (03/24 1214) Pulse Rate: 61 (03/24 1214)  Labs:  Recent Labs  03/10/17 2044 03/11/17 0148 03/11/17 1434 03/12/17 0241 03/12/17 1033  HGB 15.8  --   --   --   --   HCT 48.9  --   --   --   --   PLT 182  --   --   --   --   APTT  --   --  38* 77* 112*  LABPROT 15.4*  --   --   --   --   INR 1.21  --   --   --   --   HEPARINUNFRC  --   --  >2.20* 1.30*  --   CREATININE 1.61* 1.59*  --  1.55*  --   TROPONINI 0.06* 0.07*  --   --   --     Estimated Creatinine Clearance: 79.3 mL/min (A) (by C-G formula based on SCr of 1.55 mg/dL (H)).  Assessment: [age] yom presented to the ED with SOB and VT with ICD shocks. He is on chronic apixaban for history of afib - last dose 3/23 0945. Pt on IV heparin in case he needs cardiac cath. Utilizing PTT to monitor heparin for now until heparin levels and PTT correlating (apixaban affects heparin level).   His heparin is now supratherapeutic with aPTT 112 sec on 1400 units/hr. No bleeding noted.  I do not see a CBC for today so will order with his next lab draw.  Goal of Therapy:  Heparin level 0.3-0.7 units/ml aPTT 66-102 seconds Monitor platelets by anticoagulation protocol: Yes   Plan:  - Decrease Heparin to 1200 units/hr - Check aPTT and CBC in 6 hours  Estella HuskMichelle Jeriah Skufca, Pharm.D., BCPS, AAHIVP Clinical Pharmacist Phone: [phone] 03/12/2017, 12:49 PM

## 2017-03-12 NOTE — Progress Notes (Signed)
ANTICOAGULATION CONSULT NOTE - Follow-up Consult  Pharmacy Consult for heparin Indication: atrial fibrillation  Allergies  Allergen Reactions  . Coreg [Carvedilol] Nausea And Vomiting    Patient Measurements: Height: 6' 7.5" (201.9 cm) Weight: (!) 373 lb 3.8 oz (169.3 kg) IBW/kg (Calculated) : 94.85 Heparin Dosing Weight: 133 kg  Vital Signs: Temp: 98.1 F (36.7 C) (03/24 1937) Temp Source: Oral (03/24 1937) BP: 115/76 (03/24 1937) Pulse Rate: 62 (03/24 1937)  Labs:  Recent Labs  03/10/17 2044 03/11/17 0148  03/11/17 1434 03/12/17 0241 03/12/17 1033 03/12/17 1946  HGB 15.8  --   --   --   --   --  15.0  HCT 48.9  --   --   --   --   --  47.8  PLT 182  --   --   --   --   --  199  APTT  --   --   < > 38* 77* 112* 95*  LABPROT 15.4*  --   --   --   --   --   --   INR 1.21  --   --   --   --   --   --   HEPARINUNFRC  --   --   --  >2.20* 1.30*  --   --   CREATININE 1.61* 1.59*  --   --  1.55*  --   --   TROPONINI 0.06* 0.07*  --   --   --   --   --   < > = values in this interval not displayed.  Estimated Creatinine Clearance: 79.3 mL/min (A) (by C-G formula based on SCr of 1.55 mg/dL (H)).  Assessment: [age] yom presented to the ED with SOB and VT with ICD shocks. He is on chronic apixaban for history of afib - last dose 3/23 0945. Pt on IV heparin in case he needs cardiac cath. Utilizing PTT to monitor heparin for now until heparin levels and PTT correlating (apixaban affects heparin level).   PM PTT therapeutic  Goal of Therapy:  Heparin level 0.3-0.7 units/ml aPTT 66-102 seconds Monitor platelets by anticoagulation protocol: Yes   Plan:  Continue heparin at 1200 units / hr Follow up AM level  Thank you Okey RegalLisa Ednah Hammock, PharmD [phone]  03/12/2017, 9:20 PM

## 2017-03-12 NOTE — Progress Notes (Signed)
Progress Note  Patient Name: [name] Date of Encounter: 03/12/2017  Primary Cardiologist: Croitrou/Klein  Subjective   Feeling well this AM. No chest pain or SOB. Slept through the night without ICD shocks.  Inpatient Medications    Scheduled Meds: . allopurinol  100 mg Oral Daily  . atorvastatin  80 mg Oral Daily  . canagliflozin  100 mg Oral Daily  . clopidogrel  75 mg Oral Daily  . furosemide  40 mg Intravenous BID  . insulin aspart  32 Units Subcutaneous Q breakfast  . insulin glargine  100 Units Subcutaneous q morning - 10a  . liraglutide  1.2 mg Subcutaneous Daily  . lisinopril  10 mg Oral Daily  . metoprolol succinate  50 mg Oral Daily  . sodium chloride flush  3 mL Intravenous Q12H   Continuous Infusions: . amiodarone 30 mg/hr (03/12/17 0609)  . heparin 1,400 Units/hr (03/12/17 0609)   PRN Meds: sodium chloride, acetaminophen, LORazepam, nitroGLYCERIN, ondansetron (ZOFRAN) IV, sodium chloride flush, traMADol   Vital Signs    Vitals:   03/11/17 1943 03/12/17 0000 03/12/17 0402 03/12/17 0500  BP: (!) 129/97 (!) 106/94 138/80   Pulse:  63 68   Resp:  (!) 23 20   Temp: 98.1 F (36.7 C) 98.3 F (36.8 C) 98.1 F (36.7 C)   TempSrc: Oral Oral Oral   SpO2:  100% 97%   Weight:    (!) 373 lb 3.8 oz (169.3 kg)  Height:        Intake/Output Summary (Last 24 hours) at 03/12/17 0743 Last data filed at 03/12/17 1610  Gross per 24 hour  Intake          1092.21 ml  Output             1425 ml  Net          -332.79 ml   Filed Weights   03/11/17 0021 03/11/17 0341 03/12/17 0500  Weight: (!) 373 lb (169.2 kg) (!) 373 lb 9.6 oz (169.5 kg) (!) 373 lb 3.8 oz (169.3 kg)    Telemetry    Sinus rhythm with PVCs and NSVT. ICD shock for fast VT at 10:30 on 3/23 - Personally Reviewed  ECG    Sinus rhythm with PVCs, old anterior infarct - Personally Reviewed  Physical Exam   GEN: No acute distress.   Neck: No JVD Cardiac: RRR, no murmurs, rubs, or  gallops.  Respiratory: Clear to auscultation bilaterally. GI: Soft, nontender, non-distended  MS: No edema; No deformity. Neuro:  Nonfocal  Psych: Normal affect   Labs    Chemistry Recent Labs Lab 03/10/17 2044 03/11/17 0148 03/12/17 0241  NA 137 136 137  K 4.1 3.9 4.5  CL 102 101 102  CO2 26 25 24   GLUCOSE 126* 148* 83  BUN 17 20 19   CREATININE 1.61* 1.59* 1.55*  CALCIUM 9.1 8.9 9.0  PROT 6.9  --   --   ALBUMIN 3.2*  --   --   AST 33  --   --   ALT 24  --   --   ALKPHOS 58  --   --   BILITOT 1.2  --   --   GFRNONAA 42* 43* 44*  GFRAA 49* 49* 51*  ANIONGAP 9 10 11      Hematology Recent Labs Lab 03/10/17 2044  WBC 11.8*  RBC 5.81  HGB 15.8  HCT 48.9  MCV 84.2  MCH 27.2  MCHC 32.3  RDW 16.9*  PLT 182    Cardiac Enzymes Recent Labs Lab 03/10/17 2044 03/11/17 0148  TROPONINI 0.06* 0.07*   No results for input(s): TROPIPOC in the last 168 hours.   BNP Recent Labs Lab 03/11/17 0953  BNP 296.1*     DDimer No results for input(s): DDIMER in the last 168 hours.   Radiology    Dg Chest 2 View  Result Date: 03/10/2017 CLINICAL DATA:  Pt c/o chronic SOB and states his pacemaker/defibullator went off twice today. Hx of CHF, DM, NSTEMI, HTN, AND AFIB. Pt is a current smoker. EXAM: CHEST - 2 VIEW COMPARISON:  06/24/2016 FINDINGS: Lungs are clear. Heart size upper limits normal.  Atheromatous aorta. No effusion. Stable left subclavian AICD.  Previous median sternotomy and   CABG. IMPRESSION: No acute cardiopulmonary disease post CABG. Electronically Signed   By: Corlis Leak M.D.   On: 03/10/2017 20:25    Cardiac Studies   TTE 2016 - This is a limited study to look at LV systolic function. Definity was used. Technically difficult study even with Definity.Left ventricular function appears to be moderately depressed especially at the apex.  Cath 06/2016  Ost LAD lesion, 100% stenosed.  Prox Cx lesion, 100% stenosed.  Mid RCA lesion, 100%  stenosed.  LIMA was injected is normal in caliber, and is anatomically normal.  Prox LAD to Dist LAD lesion, 100% stenosed.  Dist LAD lesion, 99% stenosed.  SVG was injected .  Origin lesion, 100% stenosed.  SVG was injected .  Origin lesion, 100% stenosed.  SVG was injected .  Dist Graft lesion, 80% stenosed.  There is severe left ventricular systolic dysfunction.   Severe ischemic cardiomyopathy with a dilated left ventricle and an ejection fraction of 10-15%.  LVEDP 37 mm Hg.   Severe native CAD with total occlusion of the LAD at the ostium; total occlusion of the proximal circumflex; and total proximal occlusion of the proximal RCA.  There are bridging collaterals supplying the distal portion of the RCA as well as collaterals from a conus like branch supplying the mid circumflex.  Old occlusion of the vein graft which is supplied the circumflex and the vein graft which had supplied the RCA.  Patent vein graft supplying the diagonal vessel with the previously placed 2.538 mm stent that is patent but with evidence for a distal 80% eccentric stenosis beyond the stented segment immediately proximal to the anastomosis.  Patent LIMA graft supplying the mid LAD but with  subtotal diffuse distal LAD stenosis beyond the LIMA insertion.   Successful percutaneous coronary intervention involving an 85% distal SVG stenosis beyond a previously placed 2.538 mm Promus premier stent treated with PTCA/ DES stenting requiring a buddy wire technique with insertion of a 2.2512 mm Synergy DES stent dilated to 2.38 mm.  The 85% stenosis was reduced to less than 10%.  There was brisk TIMI-3 flow. There is no evidence for dissection.  Patient Profile     71 y.o. male is a 71 y.o. male with PMHx of severe CAD, most recent intervention July 2017, severe ICM w/ICD, morbid obesity, strong likelyhood of OSA but patient unwilling to get evaluated, chronic CHF (systolic), DM, HTN, HLD, CRI (stage  III), reported dietary noncompliance comes to the hospital after receiving 2 shocks from his device.   Assessment & Plan    1. ICD shocks     Appropriate for fast VT Carelink reviewed, 03/09/17 and 03/10/17 once each day, fast VT failed ATP with successful shocks x1 each event,  and again today  Patient has not had chest pain, but due to his severe CAD and graft disease, would benefit from cath to determine if worsening bypass disease is contributing. Plan for Monday.  IV amiodarone load started yesterday after ICD shock after PO. Tieshia Rettinger continue IV today and switch to PO tomorrow.  The patient has been informed of [place] law, no driving for 6 months  2. CAD Reviewed the case with interventional, Noelani Harbach plan for cath Monday, stop his Eliquis and use heparin gtt given AFlutter currently.     BB added (reported nausea/vomiting episode after coreg once), not true allergy, continue plavix, statin  3. PAFib/flutter     CHA2DS2Vasc is 5, on Eliquis  4. HTN     Follow with current meds  5. CRI     Appears at his baseline  6. ICM     LVEF 10-15% by LV gram at his cath last year     Exam/CXR without evidence of fluid OL     Continue BB/ACE  7. DM     Continue current  Signed, Dmarcus Decicco Jorja LoaMartin Betzaida Cremeens, MD  03/12/2017, 7:43 AM

## 2017-03-13 ENCOUNTER — Other Ambulatory Visit (HOSPITAL_COMMUNITY): Payer: Medicare Other

## 2017-03-13 LAB — CBC
HCT: 46.5 % (ref 39.0–52.0)
Hemoglobin: 14.8 g/dL (ref 13.0–17.0)
MCH: 27.1 pg (ref 26.0–34.0)
MCHC: 31.8 g/dL (ref 30.0–36.0)
MCV: 85 fL (ref 78.0–100.0)
Platelets: 190 10*3/uL (ref 150–400)
RBC: 5.47 MIL/uL (ref 4.22–5.81)
RDW: 16.9 % — AB (ref 11.5–15.5)
WBC: 11.6 10*3/uL — ABNORMAL HIGH (ref 4.0–10.5)

## 2017-03-13 LAB — HEPARIN LEVEL (UNFRACTIONATED): Heparin Unfractionated: 0.82 IU/mL — ABNORMAL HIGH (ref 0.30–0.70)

## 2017-03-13 LAB — BASIC METABOLIC PANEL
Anion gap: 8 (ref 5–15)
BUN: 19 mg/dL (ref 6–20)
CALCIUM: 8.6 mg/dL — AB (ref 8.9–10.3)
CHLORIDE: 104 mmol/L (ref 101–111)
CO2: 25 mmol/L (ref 22–32)
CREATININE: 1.67 mg/dL — AB (ref 0.61–1.24)
GFR calc Af Amer: 47 mL/min — ABNORMAL LOW (ref >60)
GFR calc non Af Amer: 40 mL/min — ABNORMAL LOW (ref >60)
GLUCOSE: 102 mg/dL — AB (ref 65–99)
Potassium: 4 mmol/L (ref 3.5–5.1)
Sodium: 137 mmol/L (ref 135–145)

## 2017-03-13 LAB — GLUCOSE, CAPILLARY
GLUCOSE-CAPILLARY: 93 mg/dL (ref 65–99)
GLUCOSE-CAPILLARY: 139 mg/dL — AB (ref 65–99)
Glucose-Capillary: 92 mg/dL (ref 65–99)
Glucose-Capillary: 88 mg/dL (ref 65–99)

## 2017-03-13 LAB — APTT: aPTT: 71 seconds — ABNORMAL HIGH (ref 24–36)

## 2017-03-13 LAB — MAGNESIUM: Magnesium: 2.1 mg/dL (ref 1.7–2.4)

## 2017-03-13 MED ORDER — AMIODARONE HCL 200 MG PO TABS
400.0000 mg | ORAL_TABLET | Freq: Two times a day (BID) | ORAL | Status: DC
  Administered 2017-03-13 – 2017-03-17 (×9): 400 mg via ORAL
  Filled 2017-03-13 (×9): qty 2

## 2017-03-13 NOTE — Progress Notes (Signed)
ANTICOAGULATION CONSULT NOTE - Follow-up Consult  Pharmacy Consult for heparin Indication: atrial fibrillation  Allergies  Allergen Reactions  . Coreg [Carvedilol] Nausea And Vomiting    Patient Measurements: Height: 6' 7.5" (201.9 cm) Weight: (!) 373 lb 3.8 oz (169.3 kg) IBW/kg (Calculated) : 94.85 Heparin Dosing Weight: 133 kg  Vital Signs: Temp: 98.6 F (37 C) (03/25 0723) Temp Source: Oral (03/25 0723) BP: 137/80 (03/25 0723) Pulse Rate: 62 (03/25 0723)  Labs:  Recent Labs  03/10/17 2044 03/11/17 0148  03/11/17 1434 03/12/17 0241 03/12/17 1033 03/12/17 1946 03/13/17 0248  HGB 15.8  --   --   --   --   --  15.0 14.8  HCT 48.9  --   --   --   --   --  47.8 46.5  PLT 182  --   --   --   --   --  199 190  APTT  --   --   < > 38* 77* 112* 95* 71*  LABPROT 15.4*  --   --   --   --   --   --   --   INR 1.21  --   --   --   --   --   --   --   HEPARINUNFRC  --   --   --  >2.20* 1.30*  --   --  0.82*  CREATININE 1.61* 1.59*  --   --  1.55*  --   --  1.67*  TROPONINI 0.06* 0.07*  --   --   --   --   --   --   < > = values in this interval not displayed.  Estimated Creatinine Clearance: 73.6 mL/min (A) (by C-G formula based on SCr of 1.67 mg/dL (H)).  Assessment: [age] yom presented to the ED with SOB and VT with ICD shocks. He is on chronic apixaban for history of afib - last dose 3/23 0945. Apixaban held and pharmacy to dose heparin in anticipation of cardiac cath on Monday. Utilizing PTT to monitor heparin for now until heparin levels and PTT correlating (apixaban affects heparin level). PTT therapeutic this AM. CBC stable, no s/s bleeding noted.   Goal of Therapy:  Heparin level 0.3-0.7 units/ml aPTT 66-102 seconds Monitor platelets by anticoagulation protocol: Yes   Plan:  Continue heparin at 1200 units / hr Daily PTT, heparin level, CBC Monitor for s/s bleeding F/u plans post-cath (Monday)   York CeriseKatherine Cook, PharmD Pharmacy Resident  Pager  [phone] 03/13/17 8:15 AM

## 2017-03-13 NOTE — Progress Notes (Signed)
Progress Note  Patient Name: [name] Date of Encounter: 03/13/2017  Primary Cardiologist: Croitrou/Klein  Subjective   Feeling well today, no further ICD shocks since the AM of admission.  Inpatient Medications    Scheduled Meds: . allopurinol  100 mg Oral Daily  . atorvastatin  80 mg Oral Daily  . canagliflozin  100 mg Oral Daily  . clopidogrel  75 mg Oral Daily  . furosemide  40 mg Intravenous BID  . insulin aspart  32 Units Subcutaneous Q breakfast  . insulin glargine  100 Units Subcutaneous q morning - 10a  . liraglutide  1.2 mg Subcutaneous Daily  . lisinopril  10 mg Oral Daily  . metoprolol succinate  50 mg Oral Daily  . sodium chloride flush  3 mL Intravenous Q12H   Continuous Infusions: . amiodarone 30 mg/hr (03/13/17 0648)  . heparin 1,200 Units/hr (03/13/17 4782)   PRN Meds: sodium chloride, acetaminophen, LORazepam, nitroGLYCERIN, ondansetron (ZOFRAN) IV, sodium chloride flush, traMADol   Vital Signs    Vitals:   03/12/17 1621 03/12/17 1937 03/12/17 2351 03/13/17 0408  BP: 121/85 115/76 128/72 (!) 144/81  Pulse: (!) 59 62 61 64  Resp: (!) 24 16 (!) 24 (!) 28  Temp: 97.6 F (36.4 C) 98.1 F (36.7 C) 98 F (36.7 C) 98.6 F (37 C)  TempSrc: Oral Oral Oral Oral  SpO2: 98% 100% 100% 95%  Weight:      Height:        Intake/Output Summary (Last 24 hours) at 03/13/17 0745 Last data filed at 03/13/17 0648  Gross per 24 hour  Intake           1681.1 ml  Output             2200 ml  Net           -518.9 ml   Filed Weights   03/11/17 0021 03/11/17 0341 03/12/17 0500  Weight: (!) 373 lb (169.2 kg) (!) 373 lb 9.6 oz (169.5 kg) (!) 373 lb 3.8 oz (169.3 kg)    Telemetry    Sinus rhythm with PVCs and NSVT. ICD shock for fast VT at 10:30 on 3/23 - Personally Reviewed  ECG    Sinus rhythm with PVCs, old anterior infarct - Personally Reviewed  Physical Exam   GEN: No acute distress.   Neck: No JVD Cardiac: RRR, no murmurs, rubs, or gallops.   Respiratory: Clear to auscultation bilaterally. GI: Soft, nontender, non-distended  MS: No edema; No deformity. Neuro:  Nonfocal  Psych: Normal affect   Labs    Chemistry  Recent Labs Lab 03/10/17 2044 03/11/17 0148 03/12/17 0241 03/13/17 0248  NA 137 136 137 137  K 4.1 3.9 4.5 4.0  CL 102 101 102 104  CO2 26 25 24 25   GLUCOSE 126* 148* 83 102*  BUN 17 20 19 19   CREATININE 1.61* 1.59* 1.55* 1.67*  CALCIUM 9.1 8.9 9.0 8.6*  PROT 6.9  --   --   --   ALBUMIN 3.2*  --   --   --   AST 33  --   --   --   ALT 24  --   --   --   ALKPHOS 58  --   --   --   BILITOT 1.2  --   --   --   GFRNONAA 42* 43* 44* 40*  GFRAA 49* 49* 51* 47*  ANIONGAP 9 10 11 8      Hematology  Recent Labs Lab 03/10/17 2044 03/12/17 1946 03/13/17 0248  WBC 11.8* 12.6* 11.6*  RBC 5.81 5.60 5.47  HGB 15.8 15.0 14.8  HCT 48.9 47.8 46.5  MCV 84.2 85.4 85.0  MCH 27.2 26.8 27.1  MCHC 32.3 31.4 31.8  RDW 16.9* 17.0* 16.9*  PLT 182 199 190    Cardiac Enzymes  Recent Labs Lab 03/10/17 2044 03/11/17 0148  TROPONINI 0.06* 0.07*   No results for input(s): TROPIPOC in the last 168 hours.   BNP  Recent Labs Lab 03/11/17 0953  BNP 296.1*     DDimer No results for input(s): DDIMER in the last 168 hours.   Radiology    No results found.  Cardiac Studies   TTE 2016 - This is a limited study to look at LV systolic function. Definity was used. Technically difficult study even with Definity.Left ventricular function appears to be moderately depressed especially at the apex.  Cath 06/2016  Ost LAD lesion, 100% stenosed.  Prox Cx lesion, 100% stenosed.  Mid RCA lesion, 100% stenosed.  LIMA was injected is normal in caliber, and is anatomically normal.  Prox LAD to Dist LAD lesion, 100% stenosed.  Dist LAD lesion, 99% stenosed.  SVG was injected .  Origin lesion, 100% stenosed.  SVG was injected .  Origin lesion, 100% stenosed.  SVG was injected .  Dist Graft  lesion, 80% stenosed.  There is severe left ventricular systolic dysfunction.   Severe ischemic cardiomyopathy with a dilated left ventricle and an ejection fraction of 10-15%.  LVEDP 37 mm Hg.   Severe native CAD with total occlusion of the LAD at the ostium; total occlusion of the proximal circumflex; and total proximal occlusion of the proximal RCA.  There are bridging collaterals supplying the distal portion of the RCA as well as collaterals from a conus like branch supplying the mid circumflex.  Old occlusion of the vein graft which is supplied the circumflex and the vein graft which had supplied the RCA.  Patent vein graft supplying the diagonal vessel with the previously placed 2.538 mm stent that is patent but with evidence for a distal 80% eccentric stenosis beyond the stented segment immediately proximal to the anastomosis.  Patent LIMA graft supplying the mid LAD but with  subtotal diffuse distal LAD stenosis beyond the LIMA insertion.   Successful percutaneous coronary intervention involving an 85% distal SVG stenosis beyond a previously placed 2.538 mm Promus premier stent treated with PTCA/ DES stenting requiring a buddy wire technique with insertion of a 2.2512 mm Synergy DES stent dilated to 2.38 mm.  The 85% stenosis was reduced to less than 10%.  There was brisk TIMI-3 flow. There is no evidence for dissection.  Patient Profile     71 y.o. male is a 71 y.o. male with PMHx of severe CAD, most recent intervention July 2017, severe ICM w/ICD, morbid obesity, strong likelyhood of OSA but patient unwilling to get evaluated, chronic CHF (systolic), DM, HTN, HLD, CRI (stage III), reported dietary noncompliance comes to the hospital after receiving 2 shocks from his device.   Assessment & Plan    1. ICD shocks     Appropriate for fast VT Carelink reviewed, 03/09/17 and 03/10/17 once each day, fast VT failed ATP with successful shocks x1 each event, and again today  Patient  has not had chest pain, but due to his severe CAD and graft disease, would benefit from cath to determine if worsening bypass disease is contributing. Plan for Monday.  IV  amiodarone load started yesterday after ICD shock after PO. Plan to stop IV today and start him on 400 mg BID today.   The patient has been informed of [place] law, no driving for 6 months  2. CAD Reviewed the case with interventional, Demontrez Rindfleisch plan for cath Monday, stop his Eliquis and use heparin gtt given AFlutter currently.     BB added (reported nausea/vomiting episode after coreg once), not true allergy, continue plavix, statin  3. PAFib/flutter     CHA2DS2Vasc is 5, on heparin, Eliquis on hold  4. HTN     Follow with current meds  5. CRI     Appears at his baseline  6. ICM     LVEF 10-15% by LV gram at his cath last year     Exam/CXR without evidence of fluid OL     Continue BB/ACE  7. DM     Continue current  Signed, Jeanise Durfey Jorja LoaMartin Taige Housman, MD  03/13/2017, 7:45 AM

## 2017-03-13 NOTE — Progress Notes (Signed)
Pt had 7 beat run of Vtach. Nonsymptomatic. PA paged. Will continue to monitor.

## 2017-03-14 ENCOUNTER — Ambulatory Visit (HOSPITAL_COMMUNITY): Admit: 2017-03-14 | Payer: Medicare Other | Admitting: Cardiovascular Disease

## 2017-03-14 ENCOUNTER — Inpatient Hospital Stay (HOSPITAL_COMMUNITY): Payer: Medicare Other

## 2017-03-14 ENCOUNTER — Telehealth: Payer: Self-pay | Admitting: Internal Medicine

## 2017-03-14 ENCOUNTER — Encounter (HOSPITAL_COMMUNITY): Payer: Self-pay | Admitting: Interventional Cardiology

## 2017-03-14 ENCOUNTER — Encounter (HOSPITAL_COMMUNITY)
Admission: EM | Disposition: A | Discharge status: Home / Self Care | Payer: Self-pay | Source: Home / Self Care | Attending: Cardiovascular Disease

## 2017-03-14 DIAGNOSIS — R9431 Abnormal electrocardiogram [ECG] [EKG]: Secondary | ICD-10-CM

## 2017-03-14 DIAGNOSIS — Z4502 Encounter for adjustment and management of automatic implantable cardiac defibrillator: Secondary | ICD-10-CM

## 2017-03-14 DIAGNOSIS — I2510 Atherosclerotic heart disease of native coronary artery without angina pectoris: Secondary | ICD-10-CM

## 2017-03-14 HISTORY — PX: LEFT HEART CATH AND CORS/GRAFTS ANGIOGRAPHY: CATH118250

## 2017-03-14 LAB — GLUCOSE, CAPILLARY
Glucose-Capillary: 88 mg/dL (ref 65–99)
Glucose-Capillary: 127 mg/dL — ABNORMAL HIGH (ref 65–99)
Glucose-Capillary: 85 mg/dL (ref 65–99)
Glucose-Capillary: 122 mg/dL — ABNORMAL HIGH (ref 65–99)

## 2017-03-14 LAB — BASIC METABOLIC PANEL
Anion gap: 7 (ref 5–15)
BUN: 17 mg/dL (ref 6–20)
CO2: 29 mmol/L (ref 22–32)
Calcium: 8.9 mg/dL (ref 8.9–10.3)
Chloride: 102 mmol/L (ref 101–111)
Creatinine, Ser: 1.58 mg/dL — ABNORMAL HIGH (ref 0.61–1.24)
GFR calc Af Amer: 50 mL/min — ABNORMAL LOW (ref >60)
GFR calc non Af Amer: 43 mL/min — ABNORMAL LOW (ref >60)
GLUCOSE: 91 mg/dL (ref 65–99)
POTASSIUM: 3.7 mmol/L (ref 3.5–5.1)
Sodium: 138 mmol/L (ref 135–145)

## 2017-03-14 LAB — CBC
HEMATOCRIT: 46.4 % (ref 39.0–52.0)
HEMOGLOBIN: 14.4 g/dL (ref 13.0–17.0)
MCH: 26.6 pg (ref 26.0–34.0)
MCHC: 31 g/dL (ref 30.0–36.0)
MCV: 85.6 fL (ref 78.0–100.0)
Platelets: 186 10*3/uL (ref 150–400)
RBC: 5.42 MIL/uL (ref 4.22–5.81)
RDW: 16.8 % — ABNORMAL HIGH (ref 11.5–15.5)
WBC: 9.6 10*3/uL (ref 4.0–10.5)

## 2017-03-14 LAB — APTT
APTT: 67 s — AB (ref 24–36)
aPTT: 80 seconds — ABNORMAL HIGH (ref 24–36)

## 2017-03-14 LAB — HEPARIN LEVEL (UNFRACTIONATED): Heparin Unfractionated: 0.75 IU/mL — ABNORMAL HIGH (ref 0.30–0.70)

## 2017-03-14 LAB — ECHOCARDIOGRAM COMPLETE
Height: 79.5 in
Weight: 6003.57 oz

## 2017-03-14 SURGERY — LEFT HEART CATH AND CORS/GRAFTS ANGIOGRAPHY
Anesthesia: LOCAL

## 2017-03-14 MED ORDER — ASPIRIN 81 MG PO CHEW
CHEWABLE_TABLET | ORAL | Status: AC
  Filled 2017-03-14: qty 1

## 2017-03-14 MED ORDER — LIDOCAINE HCL (PF) 1 % IJ SOLN
INTRAMUSCULAR | Status: DC | PRN
  Administered 2017-03-14: 2 mL

## 2017-03-14 MED ORDER — SODIUM CHLORIDE 0.9% FLUSH: 3.0000 mL | Freq: Two times a day (BID) | INTRAVENOUS | Status: DC

## 2017-03-14 MED ORDER — FENTANYL CITRATE (PF) 100 MCG/2ML IJ SOLN
INTRAMUSCULAR | Status: AC
  Filled 2017-03-14: qty 2

## 2017-03-14 MED ORDER — SODIUM CHLORIDE 0.9% FLUSH: 3.0000 mL | INTRAVENOUS | Status: DC | PRN

## 2017-03-14 MED ORDER — ONDANSETRON HCL 4 MG/2ML IJ SOLN: 4.0000 mg | Freq: Four times a day (QID) | INTRAMUSCULAR | Status: DC | PRN

## 2017-03-14 MED ORDER — HEPARIN (PORCINE) IN NACL 100-0.45 UNIT/ML-% IJ SOLN
1200.0000 [IU]/h | INTRAMUSCULAR | Status: DC
  Administered 2017-03-14: 1200 [IU]/h via INTRAVENOUS
  Administered 2017-03-15 – 2017-03-16 (×2): 1500 [IU]/h via INTRAVENOUS
  Filled 2017-03-14 (×3): qty 250

## 2017-03-14 MED ORDER — PERFLUTREN LIPID MICROSPHERE
1.0000 mL | INTRAVENOUS | Status: AC | PRN
Start: 2017-03-14 — End: 2017-03-14
  Administered 2017-03-14: 4 mL via INTRAVENOUS
  Filled 2017-03-14: qty 10

## 2017-03-14 MED ORDER — LIDOCAINE HCL (PF) 1 % IJ SOLN
INTRAMUSCULAR | Status: AC
  Filled 2017-03-14: qty 30

## 2017-03-14 MED ORDER — SODIUM CHLORIDE 0.9 % IV SOLN: INTRAVENOUS | Status: DC

## 2017-03-14 MED ORDER — VERAPAMIL HCL 2.5 MG/ML IV SOLN
INTRAVENOUS | Status: AC
  Filled 2017-03-14: qty 2

## 2017-03-14 MED ORDER — HEPARIN (PORCINE) IN NACL 2-0.9 UNIT/ML-% IJ SOLN
INTRAMUSCULAR | Status: AC
  Filled 2017-03-14: qty 1000

## 2017-03-14 MED ORDER — FENTANYL CITRATE (PF) 100 MCG/2ML IJ SOLN
INTRAMUSCULAR | Status: DC | PRN
  Administered 2017-03-14: 25 ug via INTRAVENOUS

## 2017-03-14 MED ORDER — MIDAZOLAM HCL 2 MG/2ML IJ SOLN
INTRAMUSCULAR | Status: DC | PRN
  Administered 2017-03-14: 1 mg via INTRAVENOUS

## 2017-03-14 MED ORDER — ACETAMINOPHEN 325 MG PO TABS: 650.0000 mg | ORAL_TABLET | ORAL | Status: DC | PRN

## 2017-03-14 MED ORDER — ASPIRIN 81 MG PO CHEW: 81.0000 mg | CHEWABLE_TABLET | ORAL | Status: DC

## 2017-03-14 MED ORDER — SODIUM CHLORIDE 0.9 % IV SOLN: 250.0000 mL | INTRAVENOUS | Status: DC | PRN

## 2017-03-14 MED ORDER — ASPIRIN 81 MG PO CHEW
CHEWABLE_TABLET | ORAL | Status: DC | PRN
  Administered 2017-03-14: 81 mg via ORAL

## 2017-03-14 MED ORDER — CLOPIDOGREL BISULFATE 75 MG PO TABS
ORAL_TABLET | ORAL | Status: DC | PRN
  Administered 2017-03-14: 75 mg via ORAL

## 2017-03-14 MED ORDER — SODIUM CHLORIDE 0.9 % WEIGHT BASED INFUSION: 1.0000 mL/kg/h | INTRAVENOUS | Status: DC

## 2017-03-14 MED ORDER — MIDAZOLAM HCL 2 MG/2ML IJ SOLN
INTRAMUSCULAR | Status: AC
  Filled 2017-03-14: qty 2

## 2017-03-14 MED ORDER — ASPIRIN 81 MG PO CHEW
81.0000 mg | CHEWABLE_TABLET | ORAL | Status: AC
  Administered 2017-03-14: 81 mg via ORAL

## 2017-03-14 MED ORDER — VERAPAMIL HCL 2.5 MG/ML IV SOLN
INTRAVENOUS | Status: DC | PRN
  Administered 2017-03-14: 10 mL via INTRA_ARTERIAL

## 2017-03-14 MED ORDER — HEPARIN SODIUM (PORCINE) 1000 UNIT/ML IJ SOLN
INTRAMUSCULAR | Status: DC | PRN
  Administered 2017-03-14: 6000 [IU] via INTRAVENOUS

## 2017-03-14 MED ORDER — HEPARIN SODIUM (PORCINE) 1000 UNIT/ML IJ SOLN
INTRAMUSCULAR | Status: AC
  Filled 2017-03-14: qty 1

## 2017-03-14 MED ORDER — SODIUM CHLORIDE 0.9 % WEIGHT BASED INFUSION: 3.0000 mL/kg/h | INTRAVENOUS | Status: DC

## 2017-03-14 MED ORDER — HEPARIN (PORCINE) IN NACL 2-0.9 UNIT/ML-% IJ SOLN
INTRAMUSCULAR | Status: DC | PRN
  Administered 2017-03-14: 1000 mL

## 2017-03-14 MED ORDER — SODIUM CHLORIDE 0.9% FLUSH
3.0000 mL | Freq: Two times a day (BID) | INTRAVENOUS | Status: DC
  Administered 2017-03-14 – 2017-03-16 (×4): 3 mL via INTRAVENOUS

## 2017-03-14 MED ORDER — IOPAMIDOL (ISOVUE-370) INJECTION 76%
INTRAVENOUS | Status: DC | PRN
  Administered 2017-03-14: 150 mL via INTRA_ARTERIAL

## 2017-03-14 MED ORDER — CLOPIDOGREL BISULFATE 75 MG PO TABS
ORAL_TABLET | ORAL | Status: AC
  Filled 2017-03-14: qty 1

## 2017-03-14 SURGICAL SUPPLY — 13 items
CATH INFINITI 5 FR IM (CATHETERS) ×1 IMPLANT
CATH INFINITI 5 FR LCB (CATHETERS) ×1 IMPLANT
CATH INFINITI 5FR AL1 (CATHETERS) ×1 IMPLANT
CATH INFINITI 5FR MULTPACK ANG (CATHETERS) ×1 IMPLANT
DEVICE RAD COMP TR BAND LRG (VASCULAR PRODUCTS) ×1 IMPLANT
GLIDESHEATH SLEND SS 6F .021 (SHEATH) ×1 IMPLANT
GUIDEWIRE INQWIRE 1.5J.035X260 (WIRE) IMPLANT
HOVERMATT SINGLE USE (MISCELLANEOUS) ×1 IMPLANT
INQWIRE 1.5J .035X260CM (WIRE) ×2
KIT HEART LEFT (KITS) ×2 IMPLANT
PACK CARDIAC CATHETERIZATION (CUSTOM PROCEDURE TRAY) ×2 IMPLANT
TRANSDUCER W/STOPCOCK (MISCELLANEOUS) ×2 IMPLANT
TUBING CIL FLEX 10 FLL-RA (TUBING) ×2 IMPLANT

## 2017-03-14 NOTE — Telephone Encounter (Signed)
Returned call to patient's wife.She stated [facility] was [facility] week due to ICD going off 3 times.[facility]Stated she wanted Dr.Klein to know.Advised I will send message to him.

## 2017-03-14 NOTE — Progress Notes (Signed)
SUBJECTIVE: The patient is doing well today.  At this time, he denies chest pain, shortness of breath, or any new concerns.  Mitzi Hansen. [MAR Hold] allopurinol  100 mg Oral Daily  . [MAR Hold] amiodarone  400 mg Oral BID  . aspirin  81 mg Oral Pre-Cath  . [MAR Hold] atorvastatin  80 mg Oral Daily  . [MAR Hold] canagliflozin  100 mg Oral Daily  . [MAR Hold] clopidogrel  75 mg Oral Daily  . [MAR Hold] furosemide  40 mg Intravenous BID  . [MAR Hold] insulin aspart  32 Units Subcutaneous Q breakfast  . [MAR Hold] insulin glargine  100 Units Subcutaneous q morning - 10a  . [MAR Hold] liraglutide  1.2 mg Subcutaneous Daily  . [MAR Hold] lisinopril  10 mg Oral Daily  . [MAR Hold] metoprolol succinate  50 mg Oral Daily  . [MAR Hold] sodium chloride flush  3 mL Intravenous Q12H  . sodium chloride flush  3 mL Intravenous Q12H  . sodium chloride flush  3 mL Intravenous Q12H   . [START ON 03/15/2017] sodium chloride    . sodium chloride     Followed by  . sodium chloride    . amiodarone Stopped (03/13/17 1354)  . heparin Stopped (03/14/17 0645)    OBJECTIVE: Physical Exam: Vitals:   03/14/17 0056 03/14/17 0400 03/14/17 0500 03/14/17 0737  BP: 120/76 133/85    Pulse: 76 67    Resp: 19 17    Temp: 97.6 F (36.4 C) 98.1 F (36.7 C)    TempSrc: Oral Oral    SpO2: 99% 100%  100%  Weight:   (!) 375 lb 3.6 oz (170.2 kg)   Height:        Intake/Output Summary (Last 24 hours) at 03/14/17 0744 Last data filed at 03/14/17 0700  Gross per 24 hour  Intake           177.94 ml  Output             1950 ml  Net         -1772.06 ml    Telemetry reviewed by myself, is SR, occ PVCs, infrequent NSVT longest 7 beats in last 24hours  GEN- The patient is morbidly obese, well appearing, alert and oriented x 3 today.   Head- normocephalic, atraumatic Eyes-  Sclera clear, conjunctiva pink Ears- hearing intact Oropharynx- clear Neck- supple, no JVP Lungs- CTA b/l. normal work of breathing Heart- RRR,  no significant murmurs, no rubs or gallops GI- soft, NT, ND Extremities- no clubbing, cyanosis, no edema Skin- no rash or lesion Psych- euthymic mood, full affect Neuro- no gross deficits appreciated  LABS: Basic Metabolic Panel:  Recent Labs  16/09/9602/25/18 0248 03/13/17 1640 03/14/17 0359  NA 137  --  138  K 4.0  --  3.7  CL 104  --  102  CO2 25  --  29  GLUCOSE 102*  --  91  BUN 19  --  17  CREATININE 1.67*  --  1.58*  CALCIUM 8.6*  --  8.9  MG  --  2.1  --     CBC:  Recent Labs  03/13/17 0248 03/14/17 0359  WBC 11.6* 9.6  HGB 14.8 14.4  HCT 46.5 46.4  MCV 85.0 85.6  PLT 190 186    ASSESSMENT AND PLAN:   1. ICD shocks     Appropriate for fast VT     Transitioned to PO amiodarone yesterday  NSVT only, longest 7 beats  The patient was seen/examined this morning by Dr. Elberta Fortis,      For cath this morning  The patient has been informed of [place] law, no driving for 6 months  2. CAD    Low suspicion of acute ischemic event    BB added on admission (reported nausea/vomiting episode after coreg once), not true allergy, continue plavix, statin  3. PAFib/flutter     CHA2DS2Vasc is 5, on Eliquis held given plans for cath >> heparin gtt     Resume OAC post cath  4. HTN     Follow with current meds  5. CRI     Appears at his baseline  6. ICM     LVEF 10-15% by LV gram at his cath last year     Exam/CXR without evidence of fluid OL     Continue BB/ACE     Echo this admission still pending  7. DM     Continue current  Francis Dowse, Cordelia Poche 03/14/2017 7:44 AM  I have seen and examined this patient with Francis Dowse.  Agree with above, note added to reflect my findings.  On exam, regular rhythm, no murmurs, lungs clear. Plan for LHC today to further assess graft anatomy after multiple VT shocks. Should he remain in sinus rhythm overnight, may be able to be discharged tomorrow with PO amiodarone load.    Toure Edmonds M. Rhyland Hinderliter MD 03/14/2017 8:41 AM

## 2017-03-14 NOTE — Progress Notes (Signed)
ANTICOAGULATION CONSULT NOTE - Follow-up Consult  Pharmacy Consult for heparin Indication: atrial fibrillation  Allergies  Allergen Reactions  . Coreg [Carvedilol] Nausea And Vomiting    Patient Measurements: Height: 6' 7.5" (201.9 cm) Weight: (!) 375 lb 3.6 oz (170.2 kg) IBW/kg (Calculated) : 94.85 Heparin Dosing Weight: 133 kg  Vital Signs: Temp: 98.1 F (36.7 C) (03/26 0400) Temp Source: Oral (03/26 0400) BP: 113/63 (03/26 1000) Pulse Rate: 59 (03/26 1000)  Labs:  Recent Labs  03/12/17 0241  03/12/17 1946 03/13/17 0248 03/14/17 0359  HGB  --   < > 15.0 14.8 14.4  HCT  --   --  47.8 46.5 46.4  PLT  --   --  199 190 186  APTT 77*  < > 95* 71* 80*  HEPARINUNFRC 1.30*  --   --  0.82* 0.75*  CREATININE 1.55*  --   --  1.67* 1.58*  < > = values in this interval not displayed.  Estimated Creatinine Clearance: 78 mL/min (A) (by C-G formula based on SCr of 1.58 mg/dL (H)).  Assessment: [age] yom presented to the ED with SOB and VT with ICD shocks. He is on chronic apixaban for history of afib - last dose 3/23 0945. Apixaban held and pharmacy dosing heparin drip. He is s/p cath with restenosis of SVG to diagonal stent. Plan is for cath in 1-2 days. Consulted to resume heparin drip 6 hrs after sheath removal. TR band placed ~08:50 today.  aPTT was therapeutic this morning at 80 sec, heparin level not correlating but is close at 0.75. Utilizing aPTT to monitor heparin for now until heparin levels and aPTT correlating (apixaban affects heparin level). CBC stable, no s/s bleeding noted.   Goal of Therapy:  Heparin level 0.3-0.7 units/ml aPTT 66-102 seconds Monitor platelets by anticoagulation protocol: Yes   Plan:  Resume heparin drip at 1200 units/hr without bolus ~15:00, will also ensure TR band removed 8 hr aPTT Daily aPTT, heparin level, CBC Monitor for s/s bleeding   Loura BackJennifer Eitzen, PharmD, BCPS Clinical Pharmacist Phone for today [phone] Main pharmacy -  [phone] 03/14/2017 10:10 AM

## 2017-03-14 NOTE — Telephone Encounter (Signed)
New message     Per Johna SheriffSharon , [name] was hospitalized , his defib, device has gone off twice , just wanted to make Dr Graciela HusbandsKlein aware

## 2017-03-14 NOTE — Progress Notes (Signed)
Patient to cath lab at shift change this AM and not seen by AM nurse. Patient immediately transferred to [facility] post cath. This RN attempted to call report, but 6C RN unavailable as patient had just arrived to unit. Left name/number.   Leanna BattlesEckelmann, Mykell Rawl Eileen, RN.

## 2017-03-14 NOTE — Interval H&P Note (Signed)
Cath Lab Visit (complete for each Cath Lab visit)  Clinical Evaluation Leading to the Procedure:   ACS: Yes.    Non-ACS:    Anginal Classification: CCS IV  Anti-ischemic medical therapy: Minimal Therapy (1 class of medications)  Non-Invasive Test Results: No non-invasive testing performed  Prior CABG: Previous CABG  Recurrent VT.    History and Physical Interval Note:  03/14/2017 7:32 AM  Matthew GanserGeorge V Gallegos  has presented today for surgery, with the diagnosis of VT  The various methods of treatment have been discussed with the patient and family. After consideration of risks, benefits and other options for treatment, the patient has consented to  Procedure(s): Left Heart Cath and Cors/Grafts Angiography (N/A) as a surgical intervention .  The patient's history has been reviewed, patient examined, no change in status, stable for surgery.  I have reviewed the patient's chart and labs.  Questions were answered to the patient's satisfaction.     Lance MussJayadeep Aidynn Krenn

## 2017-03-14 NOTE — H&P (View-Only) (Signed)
 Progress Note  Patient Name: [name] Date of Encounter: 03/13/2017  Primary Cardiologist: Croitrou/Klein  Subjective   Feeling well today, no further ICD shocks since the AM of admission.  Inpatient Medications    Scheduled Meds: . allopurinol  100 mg Oral Daily  . atorvastatin  80 mg Oral Daily  . canagliflozin  100 mg Oral Daily  . clopidogrel  75 mg Oral Daily  . furosemide  40 mg Intravenous BID  . insulin aspart  32 Units Subcutaneous Q breakfast  . insulin glargine  100 Units Subcutaneous q morning - 10a  . liraglutide  1.2 mg Subcutaneous Daily  . lisinopril  10 mg Oral Daily  . metoprolol succinate  50 mg Oral Daily  . sodium chloride flush  3 mL Intravenous Q12H   Continuous Infusions: . amiodarone 30 mg/hr (03/13/17 0648)  . heparin 1,200 Units/hr (03/13/17 0648)   PRN Meds: sodium chloride, acetaminophen, LORazepam, nitroGLYCERIN, ondansetron (ZOFRAN) IV, sodium chloride flush, traMADol   Vital Signs    Vitals:   03/12/17 1621 03/12/17 1937 03/12/17 2351 03/13/17 0408  BP: 121/85 115/76 128/72 (!) 144/81  Pulse: (!) 59 62 61 64  Resp: (!) 24 16 (!) 24 (!) 28  Temp: 97.6 F (36.4 C) 98.1 F (36.7 C) 98 F (36.7 C) 98.6 F (37 C)  TempSrc: Oral Oral Oral Oral  SpO2: 98% 100% 100% 95%  Weight:      Height:        Intake/Output Summary (Last 24 hours) at 03/13/17 0745 Last data filed at 03/13/17 0648  Gross per 24 hour  Intake           1681.1 ml  Output             2200 ml  Net           -518.9 ml   Filed Weights   03/11/17 0021 03/11/17 0341 03/12/17 0500  Weight: (!) 373 lb (169.2 kg) (!) 373 lb 9.6 oz (169.5 kg) (!) 373 lb 3.8 oz (169.3 kg)    Telemetry    Sinus rhythm with PVCs and NSVT. ICD shock for fast VT at 10:30 on 3/23 - Personally Reviewed  ECG    Sinus rhythm with PVCs, old anterior infarct - Personally Reviewed  Physical Exam   GEN: No acute distress.   Neck: No JVD Cardiac: RRR, no murmurs, rubs, or gallops.   Respiratory: Clear to auscultation bilaterally. GI: Soft, nontender, non-distended  MS: No edema; No deformity. Neuro:  Nonfocal  Psych: Normal affect   Labs    Chemistry  Recent Labs Lab 03/10/17 2044 03/11/17 0148 03/12/17 0241 03/13/17 0248  NA 137 136 137 137  K 4.1 3.9 4.5 4.0  CL 102 101 102 104  CO2 26 25 24 25  GLUCOSE 126* 148* 83 102*  BUN 17 20 19 19  CREATININE 1.61* 1.59* 1.55* 1.67*  CALCIUM 9.1 8.9 9.0 8.6*  PROT 6.9  --   --   --   ALBUMIN 3.2*  --   --   --   AST 33  --   --   --   ALT 24  --   --   --   ALKPHOS 58  --   --   --   BILITOT 1.2  --   --   --   GFRNONAA 42* 43* 44* 40*  GFRAA 49* 49* 51* 47*  ANIONGAP 9 10 11 8     Hematology    Recent Labs Lab 03/10/17 2044 03/12/17 1946 03/13/17 0248  WBC 11.8* 12.6* 11.6*  RBC 5.81 5.60 5.47  HGB 15.8 15.0 14.8  HCT 48.9 47.8 46.5  MCV 84.2 85.4 85.0  MCH 27.2 26.8 27.1  MCHC 32.3 31.4 31.8  RDW 16.9* 17.0* 16.9*  PLT 182 199 190    Cardiac Enzymes  Recent Labs Lab 03/10/17 2044 03/11/17 0148  TROPONINI 0.06* 0.07*   No results for input(s): TROPIPOC in the last 168 hours.   BNP  Recent Labs Lab 03/11/17 0953  BNP 296.1*     DDimer No results for input(s): DDIMER in the last 168 hours.   Radiology    No results found.  Cardiac Studies   TTE 2016 - This is a limited study to look at LV systolic function. Definity was used. Technically difficult study even with Definity.Left ventricular function appears to be moderately depressed especially at the apex.  Cath 06/2016  Ost LAD lesion, 100% stenosed.  Prox Cx lesion, 100% stenosed.  Mid RCA lesion, 100% stenosed.  LIMA was injected is normal in caliber, and is anatomically normal.  Prox LAD to Dist LAD lesion, 100% stenosed.  Dist LAD lesion, 99% stenosed.  SVG was injected .  Origin lesion, 100% stenosed.  SVG was injected .  Origin lesion, 100% stenosed.  SVG was injected .  Dist Graft  lesion, 80% stenosed.  There is severe left ventricular systolic dysfunction.   Severe ischemic cardiomyopathy with a dilated left ventricle and an ejection fraction of 10-15%.  LVEDP 37 mm Hg.   Severe native CAD with total occlusion of the LAD at the ostium; total occlusion of the proximal circumflex; and total proximal occlusion of the proximal RCA.  There are bridging collaterals supplying the distal portion of the RCA as well as collaterals from a conus like branch supplying the mid circumflex.  Old occlusion of the vein graft which is supplied the circumflex and the vein graft which had supplied the RCA.  Patent vein graft supplying the diagonal vessel with the previously placed 2.538 mm stent that is patent but with evidence for a distal 80% eccentric stenosis beyond the stented segment immediately proximal to the anastomosis.  Patent LIMA graft supplying the mid LAD but with  subtotal diffuse distal LAD stenosis beyond the LIMA insertion.   Successful percutaneous coronary intervention involving an 85% distal SVG stenosis beyond a previously placed 2.538 mm Promus premier stent treated with PTCA/ DES stenting requiring a buddy wire technique with insertion of a 2.2512 mm Synergy DES stent dilated to 2.38 mm.  The 85% stenosis was reduced to less than 10%.  There was brisk TIMI-3 flow. There is no evidence for dissection.  Patient Profile     71 y.o. male is a 71 y.o. male with PMHx of severe CAD, most recent intervention July 2017, severe ICM w/ICD, morbid obesity, strong likelyhood of OSA but patient unwilling to get evaluated, chronic CHF (systolic), DM, HTN, HLD, CRI (stage III), reported dietary noncompliance comes to the hospital after receiving 2 shocks from his device.   Assessment & Plan    1. ICD shocks     Appropriate for fast VT [facility] reviewed, 03/09/17 and 03/10/17 once each day, fast VT failed ATP with successful shocks x1 each event, and again today  Patient  has not had chest pain, but due to his severe CAD and graft disease, would benefit from cath to determine if worsening bypass disease is contributing. Plan for Monday.  IV   amiodarone load started yesterday after ICD shock after PO. Plan to stop IV today and start him on 400 mg BID today.   The patient has been informed of [place] law, no driving for 6 months  2. CAD Reviewed the case with interventional, Taydon Nasworthy plan for cath Monday, stop his Eliquis and use heparin gtt given AFlutter currently.     BB added (reported nausea/vomiting episode after coreg once), not true allergy, continue plavix, statin  3. PAFib/flutter     CHA2DS2Vasc is 5, on heparin, Eliquis on hold  4. HTN     Follow with current meds  5. CRI     Appears at his baseline  6. ICM     LVEF 10-15% by LV gram at his cath last year     Exam/CXR without evidence of fluid OL     Continue BB/ACE  7. DM     Continue current  Signed, Nam Vossler Martin Anshu Wehner, MD  03/13/2017, 7:45 AM    

## 2017-03-14 NOTE — Progress Notes (Signed)
  Echocardiogram 2D Echocardiogram has been performed.  Matthew RochENNINGTON, [name] 03/14/2017, 12:03 PM

## 2017-03-14 NOTE — Progress Notes (Signed)
TR BAND REMOVAL  LOCATION:    right radial  DEFLATED PER PROTOCOL:    Yes.    TIME BAND OFF / DRESSING APPLIED:    1300   SITE UPON ARRIVAL:    Level 0  SITE AFTER BAND REMOVAL:    Level 0  CIRCULATION SENSATION AND MOVEMENT:    Within Normal Limits   Yes.    Rechecked at 1315 with no change in assessment, dressing dry and intact, CSMs wnls. Radial and ulnar pulses palpable.

## 2017-03-15 DIAGNOSIS — Z4502 Encounter for adjustment and management of automatic implantable cardiac defibrillator: Secondary | ICD-10-CM

## 2017-03-15 LAB — CBC
HEMATOCRIT: 45.1 % (ref 39.0–52.0)
HEMOGLOBIN: 14.2 g/dL (ref 13.0–17.0)
MCH: 26.9 pg (ref 26.0–34.0)
MCHC: 31.5 g/dL (ref 30.0–36.0)
MCV: 85.4 fL (ref 78.0–100.0)
Platelets: 168 10*3/uL (ref 150–400)
RBC: 5.28 MIL/uL (ref 4.22–5.81)
RDW: 16.9 % — ABNORMAL HIGH (ref 11.5–15.5)
WBC: 9.2 10*3/uL (ref 4.0–10.5)

## 2017-03-15 LAB — GLUCOSE, CAPILLARY
GLUCOSE-CAPILLARY: 75 mg/dL (ref 65–99)
Glucose-Capillary: 48 mg/dL — ABNORMAL LOW (ref 65–99)
Glucose-Capillary: 68 mg/dL (ref 65–99)
Glucose-Capillary: 96 mg/dL (ref 65–99)
Glucose-Capillary: 79 mg/dL (ref 65–99)
Glucose-Capillary: 132 mg/dL — ABNORMAL HIGH (ref 65–99)

## 2017-03-15 LAB — HEPARIN LEVEL (UNFRACTIONATED): HEPARIN UNFRACTIONATED: 0.46 [IU]/mL (ref 0.30–0.70)

## 2017-03-15 LAB — BASIC METABOLIC PANEL
ANION GAP: 8 (ref 5–15)
BUN: 15 mg/dL (ref 6–20)
CALCIUM: 8.9 mg/dL (ref 8.9–10.3)
CO2: 27 mmol/L (ref 22–32)
Chloride: 102 mmol/L (ref 101–111)
Creatinine, Ser: 1.53 mg/dL — ABNORMAL HIGH (ref 0.61–1.24)
GFR calc non Af Amer: 45 mL/min — ABNORMAL LOW (ref >60)
GFR, EST AFRICAN AMERICAN: 52 mL/min — AB (ref >60)
GLUCOSE: 99 mg/dL (ref 65–99)
POTASSIUM: 3.5 mmol/L (ref 3.5–5.1)
Sodium: 137 mmol/L (ref 135–145)

## 2017-03-15 LAB — APTT
APTT: 82 s — AB (ref 24–36)
aPTT: 61 seconds — ABNORMAL HIGH (ref 24–36)

## 2017-03-15 MED ORDER — SODIUM CHLORIDE 0.9% FLUSH: 3.0000 mL | INTRAVENOUS | Status: DC | PRN

## 2017-03-15 MED ORDER — INSULIN ASPART 100 UNIT/ML ~~LOC~~ SOLN: 0.0000 [IU] | Freq: Every day | SUBCUTANEOUS | Status: DC

## 2017-03-15 MED ORDER — POTASSIUM CHLORIDE CRYS ER 20 MEQ PO TBCR
30.0000 meq | EXTENDED_RELEASE_TABLET | ORAL | Status: AC
  Administered 2017-03-15 (×2): 30 meq via ORAL
  Filled 2017-03-15 (×3): qty 1

## 2017-03-15 MED ORDER — INSULIN GLARGINE 100 UNIT/ML ~~LOC~~ SOLN
50.0000 [IU] | Freq: Every morning | SUBCUTANEOUS | Status: DC
  Administered 2017-03-17: 50 [IU] via SUBCUTANEOUS
  Filled 2017-03-15 (×2): qty 0.5

## 2017-03-15 MED ORDER — SODIUM CHLORIDE 0.9% FLUSH
3.0000 mL | Freq: Two times a day (BID) | INTRAVENOUS | Status: DC
Start: 2017-03-15 — End: 2017-03-16

## 2017-03-15 MED ORDER — FUROSEMIDE 10 MG/ML IJ SOLN
40.0000 mg | Freq: Two times a day (BID) | INTRAMUSCULAR | Status: DC
  Administered 2017-03-15 – 2017-03-17 (×5): 40 mg via INTRAVENOUS
  Filled 2017-03-15 (×5): qty 4

## 2017-03-15 MED ORDER — SODIUM CHLORIDE 0.9 % IV SOLN
250.0000 mL | INTRAVENOUS | Status: DC | PRN
Start: 2017-03-15 — End: 2017-03-16

## 2017-03-15 MED ORDER — INSULIN ASPART 100 UNIT/ML ~~LOC~~ SOLN: 0.0000 [IU] | Freq: Three times a day (TID) | SUBCUTANEOUS | Status: DC

## 2017-03-15 MED ORDER — SODIUM CHLORIDE 0.9 % IV SOLN
INTRAVENOUS | Status: DC
  Administered 2017-03-16: 08:00:00 via INTRAVENOUS

## 2017-03-15 MED ORDER — POTASSIUM CHLORIDE CRYS ER 20 MEQ PO TBCR: 30.0000 meq | EXTENDED_RELEASE_TABLET | ORAL | Status: DC

## 2017-03-15 MED ORDER — ASPIRIN 81 MG PO CHEW
81.0000 mg | CHEWABLE_TABLET | ORAL | Status: AC
  Administered 2017-03-16: 81 mg via ORAL
  Filled 2017-03-15: qty 1

## 2017-03-15 NOTE — Progress Notes (Signed)
SUBJECTIVE: The patient is doing well today.  At this time, he denies chest pain, shortness of breath, or any new concerns. Cath yesterday with restenosis of the SVT to the diagonal stent. Had 15 beat run of NSVT overnight, asymptomatic.  Marland Kitchen allopurinol  100 mg Oral Daily  . amiodarone  400 mg Oral BID  . atorvastatin  80 mg Oral Daily  . canagliflozin  100 mg Oral Daily  . clopidogrel  75 mg Oral Daily  . furosemide  40 mg Intravenous BID  . insulin aspart  32 Units Subcutaneous Q breakfast  . insulin glargine  100 Units Subcutaneous q morning - 10a  . liraglutide  1.2 mg Subcutaneous Daily  . lisinopril  10 mg Oral Daily  . metoprolol succinate  50 mg Oral Daily  . potassium chloride  30 mEq Oral Q2H  . sodium chloride flush  3 mL Intravenous Q12H  . sodium chloride flush  3 mL Intravenous Q12H   . heparin 1,500 Units/hr (03/15/17 0310)    OBJECTIVE: Physical Exam: Vitals:   03/14/17 1000 03/14/17 1951 03/14/17 2000 03/15/17 0647  BP: 113/63 (!) 113/96  106/76  Pulse: (!) 59 63  61  Resp: 19 19 (!) 21 13  Temp:  97.7 F (36.5 C)  97.8 F (36.6 C)  TempSrc:  Oral  Oral  SpO2: 96% 98%  100%  Weight:    (!) 372 lb (168.7 kg)  Height:        Intake/Output Summary (Last 24 hours) at 03/15/17 0820 Last data filed at 03/15/17 1610  Gross per 24 hour  Intake              484 ml  Output             1900 ml  Net            -1416 ml    Telemetry reviewed by myself, is SR, occ PVCs, infrequent NSVT longest 15 beats in last 24hours  GEN- The patient is morbidly obese, well appearing, alert and oriented x 3 today.   Head- normocephalic, atraumatic Eyes-  Sclera clear, conjunctiva pink Ears- hearing intact Oropharynx- clear Neck- supple, no JVP Lungs- CTA b/l. normal work of breathing Heart- RRR, no significant murmurs, no rubs or gallops GI- soft, NT, ND Extremities- no clubbing, cyanosis, no edema Skin- no rash or lesion Psych- euthymic mood, full affect Neuro- no  gross deficits appreciated  LABS: Basic Metabolic Panel:  Recent Labs  96/04/54 1640 03/14/17 0359 03/15/17 0150  NA  --  138 137  K  --  3.7 3.5  CL  --  102 102  CO2  --  29 27  GLUCOSE  --  91 99  BUN  --  17 15  CREATININE  --  1.58* 1.53*  CALCIUM  --  8.9 8.9  MG 2.1  --   --     CBC:  Recent Labs  03/14/17 0359 03/15/17 0150  WBC 9.6 9.2  HGB 14.4 14.2  HCT 46.4 45.1  MCV 85.6 85.4  PLT 186 168    ASSESSMENT AND PLAN:   1. ICD shocks     Appropriate for fast VT     Transitioned to PO amiodarone yesterday      NSVT only, longest 15 beats, may continue to improve with diuresis      Cath with severe disease in the SVG stent. Plan for further diuresis today with possible balloon angioplasty tomorrow. The patient  has been informed of [place] law, no driving for 6 months  2. CAD    Cath showed severe disease in the stent in the SVT to diagonal. Plan to diurese today further as LVEDP was quite elevated last night and cath tomorrow pending kidney function.    BB added on admission (reported nausea/vomiting episode after coreg once), not true allergy, continue plavix, statin  3. PAFib/flutter     CHA2DS2Vasc is 5, on Eliquis held given plans for cath >> heparin gtt      4. HTN     Follow with current meds  5. CRI     Plan for further diuresis as LVEDP was severely elevated.  6. ICM     LVEF 10-15% by LV gram at his cath last year     Exam/CXR without evidence of fluid OL, LVEDP severely elevated. Out 4L since admission. Continue IV diuresis     Continue BB/ACE     TTE stable without changes from prior  7. DM     Continue current  Nellene Courtois Elberta Fortisamnitz, MD 03/15/2017 8:20 AM

## 2017-03-15 NOTE — Progress Notes (Signed)
Spoke with PA, Janean Sarkene Ursuy regarding diabetes coordinator's recommendations. Orders received and placed per verbal order.  Will follow.   Thanks, Beryl MeagerJenny Soleia Badolato, RN, BC-ADM Inpatient Diabetes Coordinator Pager [phone] (8a-5p)

## 2017-03-15 NOTE — Care Management Important Message (Signed)
Important Message  Patient Details  Name: [name] MRN: [id number] Date of Birth: 08/12/1946   Medicare Important Message Given:  Yes    [name] 03/15/2017, 11:57 AM

## 2017-03-15 NOTE — Progress Notes (Signed)
ANTICOAGULATION CONSULT NOTE - Follow-up Consult  Pharmacy Consult for heparin Indication: atrial fibrillation  Allergies  Allergen Reactions  . Coreg [Carvedilol] Nausea And Vomiting    Patient Measurements: Height: 6' 7.5" (201.9 cm) Weight: (!) 372 lb (168.7 kg) IBW/kg (Calculated) : 94.85 Heparin Dosing Weight: 133 kg  Vital Signs: Temp: 98 F (36.7 C) (03/27 1200) Temp Source: Oral (03/27 1200) BP: 98/51 (03/27 1200) Pulse Rate: 62 (03/27 1200)  Labs:  Recent Labs  03/13/17 0248 03/14/17 0359 03/14/17 2227 03/15/17 0150 03/15/17 1032  HGB 14.8 14.4  --  14.2  --   HCT 46.5 46.4  --  45.1  --   PLT 190 186  --  168  --   APTT 71* 80* 67* 61* 82*  HEPARINUNFRC 0.82* 0.75*  --  0.46  --   CREATININE 1.67* 1.58*  --  1.53*  --     Estimated Creatinine Clearance: 80.2 mL/min (A) (by C-G formula based on SCr of 1.53 mg/dL (H)).  Assessment: 71yo male with PTT now up and therapeutic (82 sec) on heparin gtt at 1500 units/hr. Apixaban still affecting heparin level so utilizing PTT to monitor. No issues with line or bleeding reported per RN.  Heparin levels likely to correlate by the am, will stop aptt monitoring.   Goal of Therapy:  Heparin level 0.3-0.7 units/ml aPTT 66-102 seconds Monitor platelets by anticoagulation protocol: Yes   Plan:  Continue heparin gtt at 1500 units/hr Daily heparin levels for now  Sheppard CoilFrank Wilson PharmD., BCPS Clinical Pharmacist Pager [phone] 03/15/2017 2:26 PM

## 2017-03-15 NOTE — Progress Notes (Signed)
HYPOGLYCEMIC EVENT  CBG: 48  Treatment: 15 GM carbohydrate snack  Symptoms: None  Follow-up CBG: Time:1152 CBG Result:68  Possible Reasons for Event: insulin regimen  Comments/MD notified:Renee Keitha ButteUrsuy PA notified. Discussed patient insulin regimen, consulted diabetes management coordinator.

## 2017-03-15 NOTE — Progress Notes (Signed)
ANTICOAGULATION CONSULT NOTE - Follow Up Consult  Pharmacy Consult for heparin Indication: atrial fibrillation  Labs:  Recent Labs  03/12/17 0241  03/12/17 1946 03/13/17 0248 03/14/17 0359 03/14/17 2227  HGB  --   < > 15.0 14.8 14.4  --   HCT  --   --  47.8 46.5 46.4  --   PLT  --   --  199 190 186  --   APTT 77*  < > 95* 71* 80* 67*  HEPARINUNFRC 1.30*  --   --  0.82* 0.75*  --   CREATININE 1.55*  --   --  1.67* 1.58*  --   < > = values in this interval not displayed.   Assessment/Plan:  71yo male therapeutic on heparin after resumed post-cath. Will continue gtt at current rate and confirm stable with am labs.   Vernard GamblesVeronda Kalanie Fewell, PharmD, BCPS  03/15/2017,12:11 AM

## 2017-03-15 NOTE — Progress Notes (Signed)
Inpatient Diabetes Program Recommendations  AACE/ADA: New Consensus Statement on Inpatient Glycemic Control (2015)  Target Ranges:  Prepandial:   less than 140 mg/dL      Peak postprandial:   less than 180 mg/dL (1-2 hours)      Critically ill patients:  140 - 180 mg/dL   Lab Results  Component Value Date   GLUCAP 96 03/15/2017   HGBA1C 6.0 (H) 06/24/2016    Review of Glycemic Control Results for Matthew GanserORWOOD, [name] (MRN [id number]) as of 03/15/2017 14:06  Ref. Range 03/14/2017 21:34 03/15/2017 06:50 03/15/2017 11:28 03/15/2017 11:52 03/15/2017 12:17  Glucose-Capillary Latest Ref Range: 65 - 99 mg/dL 782122 (H) 75 48 (L) 68 96   Diabetes history: DM2 Outpatient Diabetes medications: Lantus 100 units QAM, Victoza 1.2 mg QD, Invokana 100 mg QD, Novolog 32 units QAM Current orders for Inpatient glycemic control: Lantus 100 units + Novolog 32 units q am + Liraglutide 1.2 mg + Invokana 100 mg  Inpatient Diabetes Program Recommendations:   Noted hypoglycemia and scheduled for PCI for am. Please consider: -Give 50% Lantus insulin dose tomorrow -Hold am Novolog due to NPO status -A1c to determine prehospital glycemic control  Thank you, Darel HongJudy E. Beauregard Jarrells, RN, MSN, CDE Inpatient Glycemic Control Team Team Pager [phone] (8am-5pm) 03/15/2017 2:13 PM

## 2017-03-15 NOTE — Care Management Note (Addendum)
Case Management Note Original Note created by Letha Capeeborah Natalyn Szymanowski 03/11/17  Patient Details  Name: Matthew GanserGeorge V Gallegos MRN: [id number] Date of Birth: 1946/02/24  Subjective/Objective:    From home with wife, presents with 2 ICD shocks within 24 hrs, he was awake during both episodes and lost consciousness with both. Patient is sedentary at home per wife.  Patient states he does not want HH services and does not want a pt eval.     NCM will cont to follow for dc needs.    3/26 1406 Letha Capeeborah Carloyn Lahue RN, BSN - s/p heart cath, on plavix, NCM will cont to follow for dc needs.     3/28 1217 Letha Capeeborah Loney Domingo RN, BSN CCM - patient is for pci today, was waiting for renal function to get better. For possible dc tomorrow, NCM will cont to follow for dc needs.     3/29 1218 Letha Capeeborah Shontay Wallner RN, BSN -patient for dc today, he is already on eliquis also.         Action/Plan:   Expected Discharge Date:  03/12/17               Expected Discharge Plan:  Home w Home Health Services  In-House Referral:     Discharge planning Services  CM Consult  Post Acute Care Choice:    Choice offered to:     DME Arranged:    DME Agency:     HH Arranged:    HH Agency:     Status of Service:  In process, will continue to follow  If discussed at Long Length of Stay Meetings, dates discussed:    Additional Comments: Pt continues to refuse PT/OT eval.  Pt educated on daily weights and adhering to low salt diet.   Per pts wife - pt is completely independent at home and his barrier for daily weights is due to not having a scale that can handle his current weight.  CM provided obesity scale options for pt including [employer] for less than $60.   CM spoke with bedside nurse and requested her to follow up with CM once pt has been mobilized (pt has not been out of bed since procedure). Cherylann ParrClaxton, Samantha S, RN 03/15/2017, 11:00 AM

## 2017-03-15 NOTE — Progress Notes (Signed)
ANTICOAGULATION CONSULT NOTE - Follow-up Consult  Pharmacy Consult for heparin Indication: atrial fibrillation  Allergies  Allergen Reactions  . Coreg [Carvedilol] Nausea And Vomiting    Patient Measurements: Height: 6' 7.5" (201.9 cm) Weight: (!) 375 lb 3.6 oz (170.2 kg) IBW/kg (Calculated) : 94.85 Heparin Dosing Weight: 133 kg  Vital Signs: Temp: 97.7 F (36.5 C) (03/26 1951) Temp Source: Oral (03/26 1951) BP: 113/96 (03/26 1951) Pulse Rate: 63 (03/26 1951)  Labs:  Recent Labs  03/13/17 0248 03/14/17 0359 03/14/17 2227 03/15/17 0150  HGB 14.8 14.4  --  14.2  HCT 46.5 46.4  --  45.1  PLT 190 186  --  168  APTT 71* 80* 67* 61*  HEPARINUNFRC 0.82* 0.75*  --  0.46  CREATININE 1.67* 1.58*  --  1.53*    Estimated Creatinine Clearance: 80.6 mL/min (A) (by C-G formula based on SCr of 1.53 mg/dL (H)).  Assessment: 71yo male with PTT now down to subtherapeutic (61 sec) on heparin gtt at 1200 units/hr. Apixaban still affecting heparin level so utilizing PTT to monitor. No issues with line or bleeding reported per RN.  Goal of Therapy:  Heparin level 0.3-0.7 units/ml aPTT 66-102 seconds Monitor platelets by anticoagulation protocol: Yes   Plan:  Increase heparin gtt to 1500 units/hr F/u 8 hr aPTT  Christoper Fabianaron Damaya Channing, PharmD, BCPS Clinical pharmacist, pager [phone] 03/15/2017 3:08 AM

## 2017-03-15 NOTE — Progress Notes (Signed)
Pt had run of vtach,( 13 beats ) asymptomaticwhile sitting. Francis Dowseenee Ursuy PA notified.

## 2017-03-16 ENCOUNTER — Encounter (HOSPITAL_COMMUNITY)
Admission: EM | Disposition: A | Discharge status: Home / Self Care | Payer: Self-pay | Source: Home / Self Care | Attending: Cardiovascular Disease

## 2017-03-16 DIAGNOSIS — Z951 Presence of aortocoronary bypass graft: Secondary | ICD-10-CM

## 2017-03-16 DIAGNOSIS — I25810 Atherosclerosis of coronary artery bypass graft(s) without angina pectoris: Secondary | ICD-10-CM

## 2017-03-16 HISTORY — PX: CORONARY BALLOON ANGIOPLASTY: CATH118233

## 2017-03-16 HISTORY — PX: THORACIC AORTOGRAM: CATH118269

## 2017-03-16 LAB — BASIC METABOLIC PANEL
ANION GAP: 8 (ref 5–15)
BUN: 15 mg/dL (ref 6–20)
CALCIUM: 9 mg/dL (ref 8.9–10.3)
CO2: 29 mmol/L (ref 22–32)
CREATININE: 1.47 mg/dL — AB (ref 0.61–1.24)
Chloride: 102 mmol/L (ref 101–111)
GFR, EST AFRICAN AMERICAN: 54 mL/min — AB (ref >60)
GFR, EST NON AFRICAN AMERICAN: 47 mL/min — AB (ref >60)
Glucose, Bld: 90 mg/dL (ref 65–99)
Potassium: 3.5 mmol/L (ref 3.5–5.1)
SODIUM: 139 mmol/L (ref 135–145)

## 2017-03-16 LAB — GLUCOSE, CAPILLARY
GLUCOSE-CAPILLARY: 115 mg/dL — AB (ref 65–99)
GLUCOSE-CAPILLARY: 76 mg/dL (ref 65–99)
Glucose-Capillary: 61 mg/dL — ABNORMAL LOW (ref 65–99)
Glucose-Capillary: 76 mg/dL (ref 65–99)
Glucose-Capillary: 73 mg/dL (ref 65–99)

## 2017-03-16 LAB — CBC
HCT: 44.4 % (ref 39.0–52.0)
Hemoglobin: 14.1 g/dL (ref 13.0–17.0)
MCH: 26.7 pg (ref 26.0–34.0)
MCHC: 31.8 g/dL (ref 30.0–36.0)
MCV: 83.9 fL (ref 78.0–100.0)
PLATELETS: 166 10*3/uL (ref 150–400)
RBC: 5.29 MIL/uL (ref 4.22–5.81)
RDW: 16.5 % — AB (ref 11.5–15.5)
WBC: 9.3 10*3/uL (ref 4.0–10.5)

## 2017-03-16 LAB — HEMOGLOBIN A1C
Hgb A1c MFr Bld: 5.7 % — ABNORMAL HIGH (ref 4.8–5.6)
MEAN PLASMA GLUCOSE: 117 mg/dL

## 2017-03-16 LAB — HEPARIN LEVEL (UNFRACTIONATED)
HEPARIN UNFRACTIONATED: 0.81 [IU]/mL — AB (ref 0.30–0.70)
Heparin Unfractionated: 0.73 IU/mL — ABNORMAL HIGH (ref 0.30–0.70)

## 2017-03-16 LAB — APTT: APTT: 117 s — AB (ref 24–36)

## 2017-03-16 LAB — POCT ACTIVATED CLOTTING TIME: ACTIVATED CLOTTING TIME: 367 s

## 2017-03-16 SURGERY — ABDOMINAL AORTAGRAM

## 2017-03-16 MED ORDER — FENTANYL CITRATE (PF) 100 MCG/2ML IJ SOLN
INTRAMUSCULAR | Status: DC | PRN
  Administered 2017-03-16 (×2): 25 ug via INTRAVENOUS

## 2017-03-16 MED ORDER — SODIUM CHLORIDE 0.9 % IV SOLN: 250.0000 mL | INTRAVENOUS | Status: DC | PRN

## 2017-03-16 MED ORDER — ONDANSETRON HCL 4 MG/2ML IJ SOLN: 4.0000 mg | Freq: Four times a day (QID) | INTRAMUSCULAR | Status: DC | PRN

## 2017-03-16 MED ORDER — ACETAMINOPHEN 325 MG PO TABS: 650.0000 mg | ORAL_TABLET | ORAL | Status: DC | PRN

## 2017-03-16 MED ORDER — IOPAMIDOL (ISOVUE-370) INJECTION 76%
INTRAVENOUS | Status: AC
Start: 2017-03-16 — End: 2017-03-16
  Filled 2017-03-16: qty 50

## 2017-03-16 MED ORDER — HEPARIN SODIUM (PORCINE) 1000 UNIT/ML IJ SOLN
INTRAMUSCULAR | Status: AC
  Filled 2017-03-16: qty 1

## 2017-03-16 MED ORDER — LABETALOL HCL 5 MG/ML IV SOLN: 10.0000 mg | INTRAVENOUS | Status: AC | PRN

## 2017-03-16 MED ORDER — IOPAMIDOL (ISOVUE-370) INJECTION 76%
INTRAVENOUS | Status: AC
  Filled 2017-03-16: qty 125

## 2017-03-16 MED ORDER — HEPARIN (PORCINE) IN NACL 2-0.9 UNIT/ML-% IJ SOLN
INTRAMUSCULAR | Status: DC | PRN
  Administered 2017-03-16: 1000 mL

## 2017-03-16 MED ORDER — MIDAZOLAM HCL 2 MG/2ML IJ SOLN
INTRAMUSCULAR | Status: AC
  Filled 2017-03-16: qty 2

## 2017-03-16 MED ORDER — FENTANYL CITRATE (PF) 100 MCG/2ML IJ SOLN
INTRAMUSCULAR | Status: AC
  Filled 2017-03-16: qty 2

## 2017-03-16 MED ORDER — HEPARIN SODIUM (PORCINE) 1000 UNIT/ML IJ SOLN
INTRAMUSCULAR | Status: DC | PRN
  Administered 2017-03-16: 12000 [IU] via INTRAVENOUS

## 2017-03-16 MED ORDER — VERAPAMIL HCL 2.5 MG/ML IV SOLN
INTRAVENOUS | Status: DC | PRN
  Administered 2017-03-16: 10 mL via INTRA_ARTERIAL

## 2017-03-16 MED ORDER — HEPARIN SODIUM (PORCINE) 1000 UNIT/ML IJ SOLN
INTRAMUSCULAR | Status: AC
Start: 2017-03-16 — End: 2017-03-16
  Filled 2017-03-16: qty 1

## 2017-03-16 MED ORDER — SODIUM CHLORIDE 0.9% FLUSH
3.0000 mL | Freq: Two times a day (BID) | INTRAVENOUS | Status: DC
Start: 2017-03-16 — End: 2017-03-17

## 2017-03-16 MED ORDER — CLOPIDOGREL BISULFATE 75 MG PO TABS
75.0000 mg | ORAL_TABLET | Freq: Every day | ORAL | Status: DC
  Administered 2017-03-17: 10:00:00 75 mg via ORAL
  Filled 2017-03-16: qty 1

## 2017-03-16 MED ORDER — IOPAMIDOL (ISOVUE-370) INJECTION 76%
INTRAVENOUS | Status: DC | PRN
  Administered 2017-03-16: 150 mL via INTRAVENOUS

## 2017-03-16 MED ORDER — LIDOCAINE HCL (PF) 1 % IJ SOLN
INTRAMUSCULAR | Status: DC | PRN
  Administered 2017-03-16: 4 mL

## 2017-03-16 MED ORDER — MIDAZOLAM HCL 2 MG/2ML IJ SOLN
INTRAMUSCULAR | Status: DC | PRN
  Administered 2017-03-16 (×2): 1 mg via INTRAVENOUS

## 2017-03-16 MED ORDER — SODIUM CHLORIDE 0.9% FLUSH: 3.0000 mL | INTRAVENOUS | Status: DC | PRN

## 2017-03-16 MED ORDER — IOPAMIDOL (ISOVUE-370) INJECTION 76%
INTRAVENOUS | Status: AC
  Filled 2017-03-16: qty 50

## 2017-03-16 MED ORDER — HEPARIN (PORCINE) IN NACL 100-0.45 UNIT/ML-% IJ SOLN
1100.0000 [IU]/h | INTRAMUSCULAR | Status: DC
  Administered 2017-03-16: 1100 [IU]/h via INTRAVENOUS
  Filled 2017-03-16: qty 250

## 2017-03-16 MED ORDER — HYDRALAZINE HCL 20 MG/ML IJ SOLN: 5.0000 mg | INTRAMUSCULAR | Status: AC | PRN

## 2017-03-16 MED ORDER — NITROGLYCERIN 1 MG/10 ML FOR IR/CATH LAB
INTRA_ARTERIAL | Status: DC | PRN
  Administered 2017-03-16: 200 ug via INTRA_ARTERIAL

## 2017-03-16 SURGICAL SUPPLY — 21 items
BALLN EMERGE MR 2.5X15 (BALLOONS) ×4
BALLOON EMERGE MR 2.5X15 (BALLOONS) IMPLANT
CATH INFINITI 5FR ANG PIGTAIL (CATHETERS) ×2 IMPLANT
CATH VISTA GUIDE 6FR AL2 (CATHETERS) ×2 IMPLANT
DEVICE RAD COMP TR BAND LRG (VASCULAR PRODUCTS) ×2 IMPLANT
GLIDESHEATH SLEND SS 6F .021 (SHEATH) ×2 IMPLANT
GUIDE CATH RUNWAY 6FR AL3 (CATHETERS) ×2 IMPLANT
GUIDE CATH RUNWAY 6FR CLS3.5 (CATHETERS) ×2 IMPLANT
GUIDE CATH RUNWAY 6FR CLS4 (CATHETERS) ×2 IMPLANT
GUIDEWIRE INQWIRE 1.5J.035X260 (WIRE) IMPLANT
HOVERMATT SINGLE USE (MISCELLANEOUS) ×2 IMPLANT
INQWIRE 1.5J .035X260CM (WIRE) ×4
KIT ENCORE 26 ADVANTAGE (KITS) ×2 IMPLANT
KIT HEART LEFT (KITS) ×4 IMPLANT
PACK CARDIAC CATHETERIZATION (CUSTOM PROCEDURE TRAY) ×4 IMPLANT
SYR MEDRAD MARK V 150ML (SYRINGE) ×2 IMPLANT
TRANSDUCER W/STOPCOCK (MISCELLANEOUS) ×4 IMPLANT
TUBING CIL FLEX 10 FLL-RA (TUBING) ×4 IMPLANT
VALVE GUARDIAN II ~~LOC~~ HEMO (MISCELLANEOUS) ×4 IMPLANT
WIRE ASAHI PROWATER 180CM (WIRE) ×2 IMPLANT
WIRE FIGHTER CROSSING 190CM (WIRE) ×2 IMPLANT

## 2017-03-16 NOTE — Progress Notes (Signed)
ANTICOAGULATION CONSULT NOTE - Follow Up Consult  Pharmacy Consult for heparin Indication: atrial fibrillation  Labs:  Recent Labs  03/14/17 0359  03/15/17 0150 03/15/17 1032 03/16/17 0133  HGB 14.4  --  14.2  --  14.1  HCT 46.4  --  45.1  --  44.4  PLT 186  --  168  --  166  APTT 80*  < > 61* 82* 117*  HEPARINUNFRC 0.75*  --  0.46  --  0.81*  CREATININE 1.58*  --  1.53*  --  1.47*  < > = values in this interval not displayed.   Assessment: 71yo male now above goal on heparin after one PTT at goal after rate increase.  Goal of Therapy:  Heparin level 0.3-0.7 units/ml   Plan:  Will decrease heparin gtt by 1 unit/kg/hr to 1300 units/hr and check level in 6hr.  Vernard GamblesVeronda Anzley Dibbern, PharmD, BCPS  03/16/2017,3:26 AM

## 2017-03-16 NOTE — Progress Notes (Signed)
ANTICOAGULATION CONSULT NOTE - Follow-up Consult  Pharmacy Consult for heparin Indication: atrial fibrillation  Allergies  Allergen Reactions  . Coreg [Carvedilol] Nausea And Vomiting    Patient Measurements: Height: 6' 7.5" (201.9 cm) Weight: (!) 372 lb 12.8 oz (169.1 kg) IBW/kg (Calculated) : 94.85 Heparin Dosing Weight: 133 kg  Vital Signs: Temp: 97.5 F (36.4 C) (03/28 0800) Temp Source: Oral (03/28 0800) BP: 106/58 (03/28 0800) Pulse Rate: 60 (03/28 0800)  Labs:  Recent Labs  03/14/17 0359  03/15/17 0150 03/15/17 1032 03/16/17 0133 03/16/17 0909  HGB 14.4  --  14.2  --  14.1  --   HCT 46.4  --  45.1  --  44.4  --   PLT 186  --  168  --  166  --   APTT 80*  < > 61* 82* 117*  --   HEPARINUNFRC 0.75*  --  0.46  --  0.81* 0.73*  CREATININE 1.58*  --  1.53*  --  1.47*  --   < > = values in this interval not displayed.  Estimated Creatinine Clearance: 83.6 mL/min (A) (by C-G formula based on SCr of 1.47 mg/dL (H)).  Assessment: 71yo male with on heparin gtt for afib. For procedure this afternoon, after which apixaban likely to be restarted.   Heparin level continues to be elevated this morning after rate adjustment. Will drop rate to 1200 units/hr and follow up after procedure this afternoon.   Goal of Therapy:  Heparin level 0.3-0.7 units/ml Monitor platelets by anticoagulation protocol: Yes   Plan:  Reduce heparin gtt to 1200 units/hr Daily heparin levels for now F/up after cath this afternoon  Sheppard CoilFrank Wilson PharmD., BCPS Clinical Pharmacist Pager [phone] 03/16/2017 11:22 AM

## 2017-03-16 NOTE — Progress Notes (Signed)
ANTICOAGULATION CONSULT NOTE - Follow-up Consult  Pharmacy Consult for heparin Indication: atrial fibrillation  Allergies  Allergen Reactions  . Coreg [Carvedilol] Nausea And Vomiting    Patient Measurements: Height: 6' 7.5" (201.9 cm) Weight: (!) 372 lb 12.8 oz (169.1 kg) IBW/kg (Calculated) : 94.85 Heparin Dosing Weight: 133 kg  Vital Signs: Temp: 97.9 F (36.6 C) (03/28 1800) Temp Source: Oral (03/28 1800) BP: 148/114 (03/28 2005) Pulse Rate: 60 (03/28 2005)  Labs:  Recent Labs  03/14/17 0359  03/15/17 0150 03/15/17 1032 03/16/17 0133 03/16/17 0909  HGB 14.4  --  14.2  --  14.1  --   HCT 46.4  --  45.1  --  44.4  --   PLT 186  --  168  --  166  --   APTT 80*  < > 61* 82* 117*  --   HEPARINUNFRC 0.75*  --  0.46  --  0.81* 0.73*  CREATININE 1.58*  --  1.53*  --  1.47*  --   < > = values in this interval not displayed.  Estimated Creatinine Clearance: 83.6 mL/min (A) (by C-G formula based on SCr of 1.47 mg/dL (H)).  Assessment: 71yo male with on heparin gtt for afib while apixaban held for cath.  S/p cath start heaprin drip 8 hr after sheath out - out 1400- TR band air out no bleeding per RN - plan removal at 2100 - start heparin at 2200 - plan for apixaban to restart in am per cath note     Goal of Therapy:  Heparin level 0.3-0.7 units/ml Monitor platelets by anticoagulation protocol: Yes   Plan:  Restart heparin gtt 1100 units/hr at 2200  Daily heparin levels for now F/up plan for apixaban restart   Leota SauersLisa Chauntae Hults Pharm.D. CPP, BCPS Clinical Pharmacist [phone] 03/16/2017 9:13 PM

## 2017-03-16 NOTE — Progress Notes (Signed)
SUBJECTIVE: The patient is doing well today.  At this time, he continues to deny any kind of chest pain, shortness of breath, or any new concerns. His only concern today is not eating this morning for his procedure this afternoon.   Marland Kitchen. allopurinol  100 mg Oral Daily  . amiodarone  400 mg Oral BID  . atorvastatin  80 mg Oral Daily  . canagliflozin  100 mg Oral Daily  . clopidogrel  75 mg Oral Daily  . furosemide  40 mg Intravenous BID WC  . insulin aspart  0-15 Units Subcutaneous TID WC  . insulin aspart  0-5 Units Subcutaneous QHS  . insulin glargine  50 Units Subcutaneous q morning - 10a  . liraglutide  1.2 mg Subcutaneous Daily  . lisinopril  10 mg Oral Daily  . metoprolol succinate  50 mg Oral Daily  . sodium chloride flush  3 mL Intravenous Q12H  . sodium chloride flush  3 mL Intravenous Q12H  . sodium chloride flush  3 mL Intravenous Q12H   . sodium chloride 10 mL/hr at 03/16/17 0800  . heparin 1,300 Units/hr (03/16/17 0700)    OBJECTIVE: Physical Exam: Vitals:   03/15/17 2000 03/15/17 2001 03/16/17 0630 03/16/17 0800  BP: (!) 101/53 (!) 101/53 132/79 (!) 106/58  Pulse:  67 65 60  Resp: (!) 30 (!) 30 20 14   Temp:  98.2 F (36.8 C) 97.8 F (36.6 C) 97.5 F (36.4 C)  TempSrc:  Oral Oral Oral  SpO2: 96% 95% 97% 95%  Weight:   (!) 372 lb 12.8 oz (169.1 kg)   Height:        Intake/Output Summary (Last 24 hours) at 03/16/17 1022 Last data filed at 03/16/17 0700  Gross per 24 hour  Intake          1612.37 ml  Output             3100 ml  Net         -1487.63 ml    Telemetry reviewed by myself, is SR, occ PVCs/couplets, infrequent NSVT longest 5 beats since late morning yesterday  GEN- The patient is morbidly obese, well appearing, alert and oriented x 3 today.   Head- normocephalic, atraumatic Eyes-  Sclera clear, conjunctiva pink Ears- hearing intact Oropharynx- clear Neck- supple, no JVP Lungs- CTA b/l. normal work of breathing Heart- RRR, no significant  murmurs, no rubs or gallops GI- soft, NT, ND Extremities- no clubbing, cyanosis, no edema Skin- no rash or lesion Psych- euthymic mood, full affect Neuro- no gross deficits appreciated  LABS: Basic Metabolic Panel:  Recent Labs  16/09/9602/25/18 1640  03/15/17 0150 03/16/17 0133  NA  --   < > 137 139  K  --   < > 3.5 3.5  CL  --   < > 102 102  CO2  --   < > 27 29  GLUCOSE  --   < > 99 90  BUN  --   < > 15 15  CREATININE  --   < > 1.53* 1.47*  CALCIUM  --   < > 8.9 9.0  MG 2.1  --   --   --   < > = values in this interval not displayed.  CBC:  Recent Labs  03/15/17 0150 03/16/17 0133  WBC 9.2 9.3  HGB 14.2 14.1  HCT 45.1 44.4  MCV 85.4 83.9  PLT 168 166    ASSESSMENT AND PLAN:   1. ICD shocks  Appropriate for fast VT     Transitioned to PO amiodarone      NSVT only, longest 5beats since yesterday,       The patient has been informed of [place] law, no driving for 6 months   2. CAD/ICM     BB added on admission (reported nausea/vomiting episode after coreg once), not true allergy, continue plavix, statin    Cath showed severe disease in the stent in the SVG to diagonal, planned to diurese further as LVEDP was quite elevated at the time of his cath Monday and attempt intervention today pending his response to diuresis          Renal function is stable     I/O notes is cumulatively fluid negative -     Weight is down 10lbs from admission     VSS       3. PAFib/flutter     CHA2DS2Vasc is 5, on Eliquis held given plans for cath >> heparin gtt with planned procedure     Plan to resume OAC post PCI     Will defer to Dr. Eldridge Dace, his recommendation post intervention duration of DAPT + his Eliquis      4. HTN     Follow with current meds  5. CRI     Plan for further diuresis as LVEDP was severely elevated.  6. ICM     LVEF 10-15% by LV gram at his cath last year      LVEDP severely elevated. Out 5L since admission.      Continue IV diuresis      Continue BB/ACE     TTE stable without changes from prior  7. DM     appreciate diabetes RN help, continue as per their recommendations with noted low BS yesterday AM   Francis Dowse, PA-C 03/16/2017 10:22 AM

## 2017-03-16 NOTE — H&P (View-Only) (Signed)
SUBJECTIVE: The patient is doing well today.  At this time, he denies chest pain, shortness of breath, or any new concerns. Cath yesterday with restenosis of the SVT to the diagonal stent. Had 15 beat run of NSVT overnight, asymptomatic.  Marland Kitchen allopurinol  100 mg Oral Daily  . amiodarone  400 mg Oral BID  . atorvastatin  80 mg Oral Daily  . canagliflozin  100 mg Oral Daily  . clopidogrel  75 mg Oral Daily  . furosemide  40 mg Intravenous BID  . insulin aspart  32 Units Subcutaneous Q breakfast  . insulin glargine  100 Units Subcutaneous q morning - 10a  . liraglutide  1.2 mg Subcutaneous Daily  . lisinopril  10 mg Oral Daily  . metoprolol succinate  50 mg Oral Daily  . potassium chloride  30 mEq Oral Q2H  . sodium chloride flush  3 mL Intravenous Q12H  . sodium chloride flush  3 mL Intravenous Q12H   . heparin 1,500 Units/hr (03/15/17 0310)    OBJECTIVE: Physical Exam: Vitals:   03/14/17 1000 03/14/17 1951 03/14/17 2000 03/15/17 0647  BP: 113/63 (!) 113/96  106/76  Pulse: (!) 59 63  61  Resp: 19 19 (!) 21 13  Temp:  97.7 F (36.5 C)  97.8 F (36.6 C)  TempSrc:  Oral  Oral  SpO2: 96% 98%  100%  Weight:    (!) 372 lb (168.7 kg)  Height:        Intake/Output Summary (Last 24 hours) at 03/15/17 0820 Last data filed at 03/15/17 1610  Gross per 24 hour  Intake              484 ml  Output             1900 ml  Net            -1416 ml    Telemetry reviewed by myself, is SR, occ PVCs, infrequent NSVT longest 15 beats in last 24hours  GEN- The patient is morbidly obese, well appearing, alert and oriented x 3 today.   Head- normocephalic, atraumatic Eyes-  Sclera clear, conjunctiva pink Ears- hearing intact Oropharynx- clear Neck- supple, no JVP Lungs- CTA b/l. normal work of breathing Heart- RRR, no significant murmurs, no rubs or gallops GI- soft, NT, ND Extremities- no clubbing, cyanosis, no edema Skin- no rash or lesion Psych- euthymic mood, full affect Neuro- no  gross deficits appreciated  LABS: Basic Metabolic Panel:  Recent Labs  96/04/54 1640 03/14/17 0359 03/15/17 0150  NA  --  138 137  K  --  3.7 3.5  CL  --  102 102  CO2  --  29 27  GLUCOSE  --  91 99  BUN  --  17 15  CREATININE  --  1.58* 1.53*  CALCIUM  --  8.9 8.9  MG 2.1  --   --     CBC:  Recent Labs  03/14/17 0359 03/15/17 0150  WBC 9.6 9.2  HGB 14.4 14.2  HCT 46.4 45.1  MCV 85.6 85.4  PLT 186 168    ASSESSMENT AND PLAN:   1. ICD shocks     Appropriate for fast VT     Transitioned to PO amiodarone yesterday      NSVT only, longest 15 beats, may continue to improve with diuresis      Cath with severe disease in the SVG stent. Plan for further diuresis today with possible balloon angioplasty tomorrow. The patient  has been informed of [place] law, no driving for 6 months  2. CAD    Cath showed severe disease in the stent in the SVT to diagonal. Plan to diurese today further as LVEDP was quite elevated last night and cath tomorrow pending kidney function.    BB added on admission (reported nausea/vomiting episode after coreg once), not true allergy, continue plavix, statin  3. PAFib/flutter     CHA2DS2Vasc is 5, on Eliquis held given plans for cath >> heparin gtt      4. HTN     Follow with current meds  5. CRI     Plan for further diuresis as LVEDP was severely elevated.  6. ICM     LVEF 10-15% by LV gram at his cath last year     Exam/CXR without evidence of fluid OL, LVEDP severely elevated. Out 4L since admission. Continue IV diuresis     Continue BB/ACE     TTE stable without changes from prior  7. DM     Continue current  Kyleah Pensabene Elberta Fortisamnitz, MD 03/15/2017 8:20 AM

## 2017-03-16 NOTE — Interval H&P Note (Signed)
Cath Lab Visit (complete for each Cath Lab visit)  Clinical Evaluation Leading to the Procedure:   ACS: Yes.    Non-ACS:    Anginal Classification: CCS IV  Anti-ischemic medical therapy: Minimal Therapy (1 class of medications)  Non-Invasive Test Results: No non-invasive testing performed  Prior CABG: Previous CABG   Recent VTach   History and Physical Interval Note:  03/16/2017 2:42 PM  [name]  has presented today for surgery, with the diagnosis of cad  The various methods of treatment have been discussed with the patient and family. After consideration of risks, benefits and other options for treatment, the patient has consented to  Procedure(s): Coronary Stent Intervention (N/A) as a surgical intervention .  The patient's history has been reviewed, patient examined, no change in status, stable for surgery.  I have reviewed the patient's chart and labs.  Questions were answered to the patient's satisfaction.     Lance MussJayadeep Lockie Bothun

## 2017-03-17 ENCOUNTER — Encounter (HOSPITAL_COMMUNITY): Payer: Self-pay | Admitting: Interventional Cardiology

## 2017-03-17 DIAGNOSIS — Z951 Presence of aortocoronary bypass graft: Secondary | ICD-10-CM

## 2017-03-17 LAB — GLUCOSE, CAPILLARY
GLUCOSE-CAPILLARY: 113 mg/dL — AB (ref 65–99)
GLUCOSE-CAPILLARY: 122 mg/dL — AB (ref 65–99)
Glucose-Capillary: 197 mg/dL — ABNORMAL HIGH (ref 65–99)

## 2017-03-17 LAB — BASIC METABOLIC PANEL
ANION GAP: 9 (ref 5–15)
BUN: 17 mg/dL (ref 6–20)
CALCIUM: 9 mg/dL (ref 8.9–10.3)
CO2: 27 mmol/L (ref 22–32)
Chloride: 100 mmol/L — ABNORMAL LOW (ref 101–111)
Creatinine, Ser: 1.61 mg/dL — ABNORMAL HIGH (ref 0.61–1.24)
GFR calc non Af Amer: 42 mL/min — ABNORMAL LOW (ref >60)
GFR, EST AFRICAN AMERICAN: 49 mL/min — AB (ref >60)
GLUCOSE: 183 mg/dL — AB (ref 65–99)
POTASSIUM: 3.4 mmol/L — AB (ref 3.5–5.1)
Sodium: 136 mmol/L (ref 135–145)

## 2017-03-17 LAB — CBC
HEMATOCRIT: 44.1 % (ref 39.0–52.0)
HEMOGLOBIN: 14.1 g/dL (ref 13.0–17.0)
MCH: 26.9 pg (ref 26.0–34.0)
MCHC: 32 g/dL (ref 30.0–36.0)
MCV: 84.2 fL (ref 78.0–100.0)
Platelets: 171 10*3/uL (ref 150–400)
RBC: 5.24 MIL/uL (ref 4.22–5.81)
RDW: 16.6 % — ABNORMAL HIGH (ref 11.5–15.5)
WBC: 11.2 10*3/uL — AB (ref 4.0–10.5)

## 2017-03-17 LAB — HEPARIN LEVEL (UNFRACTIONATED): Heparin Unfractionated: 0.32 IU/mL (ref 0.30–0.70)

## 2017-03-17 MED ORDER — METOPROLOL SUCCINATE ER 50 MG PO TB24: 50.0000 mg | ORAL_TABLET | Freq: Every day | ORAL | 1 refills | Status: DC

## 2017-03-17 MED ORDER — FUROSEMIDE 40 MG PO TABS: 80.0000 mg | ORAL_TABLET | Freq: Two times a day (BID) | ORAL | 1 refills | Status: DC

## 2017-03-17 MED ORDER — INSULIN ASPART 100 UNIT/ML ~~LOC~~ SOLN: 0.0000 [IU] | Freq: Every day | SUBCUTANEOUS | 11 refills | Status: DC

## 2017-03-17 MED ORDER — INSULIN ASPART 100 UNIT/ML ~~LOC~~ SOLN: 0.0000 [IU] | Freq: Three times a day (TID) | SUBCUTANEOUS | 11 refills | Status: DC

## 2017-03-17 MED ORDER — AMIODARONE HCL 400 MG PO TABS: 400.0000 mg | ORAL_TABLET | Freq: Two times a day (BID) | ORAL | 1 refills | Status: DC

## 2017-03-17 MED ORDER — ASPIRIN EC 81 MG PO TBEC: 81.0000 mg | DELAYED_RELEASE_TABLET | Freq: Every day | ORAL | Status: DC

## 2017-03-17 MED ORDER — APIXABAN 5 MG PO TABS
5.0000 mg | ORAL_TABLET | Freq: Two times a day (BID) | ORAL | Status: DC
  Administered 2017-03-17: 10:00:00 5 mg via ORAL
  Filled 2017-03-17: qty 1

## 2017-03-17 MED ORDER — INSULIN GLARGINE 100 UNIT/ML ~~LOC~~ SOLN: 50.0000 [IU] | Freq: Every morning | SUBCUTANEOUS | 11 refills | Status: AC

## 2017-03-17 NOTE — Discharge Summary (Signed)
ELECTROPHYSIOLOGY PROCEDURE DISCHARGE SUMMARY    Patient ID: [name],  MRN: [id number], DOB/AGE: 21-Nov-1946 71 y.o.  Admit date: 03/10/2017 Discharge date: 03/17/2017  Primary Care Physician: Devra Dopp, MD Primary Cardiologist: Croitoru Electrophysiolgist: Graciela Husbands  Primary Discharge Diagnosis:  1.  Ventricular fibrillation with appropriate ICD therapy 2.  Acute on chronic systolic heart failure 3.  CAD s/p PTCA this admission  Secondary Discharge Diagnosis: 1.  Obesity 2.  Diabetes 3.  CKD 4.  Hypertension 5.  Paroxysmal atrial fibrillation   Allergies  Allergen Reactions  . Coreg [Carvedilol] Nausea And Vomiting    Procedures This Admission:  1.  Cardiac catheterization on 03/14/17 by Dr Eldridge Dace. This study demonstrated severely elevated LVEDP.  Restenosis of the SVG to diagonal stent.   2.  PTCA of free graft to 1st diag on 03/16/17 by Dr Eldridge Dace. There were no early apparent complications.   Brief HPI/Hospital Course:  [name] is a 71 y.o. male with a past medical history as outlined above. He was admitted for ICD shocks. Device interrogation demonstrated VF.  He was found to be volume overloaded and diuresed. He was also started on amiodarone for control of ventricular arrhythmias. Because of concerns for ischemia as the cause of VF, he underwent catheterization which demonstrated restenosis of the SVG to diagonal stent. He was diuresed and underwent subsequent PTCA.  He had no further sustained ventricular arrhythmias on telemetry. He was seen by Dr Graciela Husbands and considered stable for discharge to home.  His CBGs were consistently low this admission and he was seen by diabetes coordinator who recommended decreasing lantus as well as discontinuing scheduled Humalog and adding sliding scale instead.  He should have follow up with PCP as an outpatient.  Amiodarone will be continued at 400mg  twice daily for 2 weeks then decrease to 400mg  daily. At follow up,  consideration can be given to decreasing further. He will also need follow up amiodarone surveillance labs. No driving x6 months.   Physical Exam: Vitals:   03/16/17 2110 03/17/17 0206 03/17/17 0700 03/17/17 1228  BP:  122/68 120/85 124/77  Pulse: 64 64 60 63  Resp: (!) 24 18 (!) 22 19  Temp:  97.6 F (36.4 C) 98.2 F (36.8 C) 98.2 F (36.8 C)  TempSrc:  Oral Oral Oral  SpO2: 98% 95% 94% 98%  Weight:  (!) 373 lb 3.8 oz (169.3 kg)    Height:        Labs:   Lab Results  Component Value Date   WBC 11.2 (H) 03/17/2017   HGB 14.1 03/17/2017   HCT 44.1 03/17/2017   MCV 84.2 03/17/2017   PLT 171 03/17/2017    Recent Labs Lab 03/10/17 2044  03/17/17 0344  NA 137  < > 136  K 4.1  < > 3.4*  CL 102  < > 100*  CO2 26  < > 27  BUN 17  < > 17  CREATININE 1.61*  < > 1.61*  CALCIUM 9.1  < > 9.0  PROT 6.9  --   --   BILITOT 1.2  --   --   ALKPHOS 58  --   --   ALT 24  --   --   AST 33  --   --   GLUCOSE 126*  < > 183*  < > = values in this interval not displayed.   Discharge Medications:  Current Discharge Medication List    START taking these medications  Details  amiodarone (PACERONE) 400 MG tablet Take 1 tablet (400 mg total) by mouth 2 (two) times daily. Take 1 tablet twice daily for 2 weeks then take 1 tablet daily for 2 weeks Qty: 60 tablet, Refills: 1    aspirin EC 81 MG tablet Take 1 tablet (81 mg total) by mouth daily.    metoprolol succinate (TOPROL-XL) 50 MG 24 hr tablet Take 1 tablet (50 mg total) by mouth daily. Take with or immediately following a meal. Qty: 30 tablet, Refills: 1      CONTINUE these medications which have CHANGED   Details  furosemide (LASIX) 40 MG tablet Take 2 tablets (80 mg total) by mouth 2 (two) times daily. Qty: 180 tablet, Refills: 1    !! insulin aspart (NOVOLOG) 100 UNIT/ML injection Inject 0-15 Units into the skin 3 (three) times daily with meals. Sliding scale as directed by diabetes coordinator. Follow up with PCP within 1  week Qty: 10 mL, Refills: 11    !! insulin aspart (NOVOLOG) 100 UNIT/ML injection Inject 0-5 Units into the skin at bedtime. Sliding scale as directed by diabetes coordinator. Follow up with PCP within 1 week Qty: 10 mL, Refills: 11    insulin glargine (LANTUS) 100 UNIT/ML injection Inject 0.5 mLs (50 Units total) into the skin every morning. Qty: 10 mL, Refills: 11     !! - Potential duplicate medications found. Please discuss with provider.    CONTINUE these medications which have NOT CHANGED   Details  acetaminophen (TYLENOL) 325 MG tablet Take 1-2 tablets (325-650 mg total) by mouth every 4 (four) hours as needed for mild pain.    allopurinol (ZYLOPRIM) 100 MG tablet Take 100 mg by mouth daily.    atorvastatin (LIPITOR) 80 MG tablet take 1 tablet by mouth once daily Qty: 90 tablet, Refills: 3    cholecalciferol (VITAMIN D) 1000 UNITS tablet Take 1,000 Units by mouth every other day.     clopidogrel (PLAVIX) 75 MG tablet Take 1 tablet (75 mg total) by mouth daily. Qty: 30 tablet, Refills: 2    ELIQUIS 5 MG TABS tablet take 1 tablet by mouth twice a day Qty: 60 tablet, Refills: 2    INVOKANA 100 MG TABS Take 100 mg by mouth daily.    lisinopril (PRINIVIL,ZESTRIL) 10 MG tablet take 1 tablet by mouth once daily Qty: 90 tablet, Refills: 3    nitroGLYCERIN (NITROSTAT) 0.4 MG SL tablet place 1 tablet under the tongue if needed every 5 minutes for chest pain Refills: 0    Omega-3 Fatty Acids (FISH OIL) 1200 MG CAPS Take 1,200 mg by mouth daily.     VICTOZA 18 MG/3ML SOPN Inject 1.2 mg into the skin daily. Refills: 0        Disposition: Pt is being discharged home today in good condition. Discharge Instructions    Amb Referral to Cardiac Rehabilitation    Complete by:  As directed    Pt has attempted program before and could not do.   Diagnosis:  PTCA   Diet - low sodium heart healthy    Complete by:  As directed    Increase activity slowly    Complete by:  As  directed      Follow-up Information    Thurmon FairMihai Croitoru, MD Follow up on 04/13/2017.   Specialty:  Cardiology Why:  at 9:20AM  Contact information: [address] [place] [address] [phone]        Sherryl MangesSteven Klein, MD Follow up  on 05/03/2017.   Specialty:  Cardiology Why:  at 3:30PM Contact information: [address] [place] [address] [phone]        Devra Dopp, MD Follow up in 1 week(s).   Specialty:  Family Medicine Contact information: [address] [place] [address] [phone]           Duration of Discharge Encounter: Greater than 30 minutes including physician time.  Signed, Gypsy Balsam, NP 03/17/2017 12:45 PM

## 2017-03-17 NOTE — Care Management Note (Signed)
Case Management Note  Patient Details  Name: [name] MRN: [id number] Date of Birth: [birth year]  Subjective/Objective:   From home with wife, presents with 2 ICD shocks within 24 hrs, he was awake during both episodes and lost consciousness with both. Patient is sedentary at home per wife.  Patient states he does not want HH services and does not want a pt eval.     NCM will cont to follow for dc needs.    3/26 1406 Letha Capeeborah Tyreque Finken RN, BSN - s/p heart cath, on plavix, NCM will cont to follow for dc needs.     3/28 1217 Letha Capeeborah Tiffanny Lamarche RN, BSN CCM - patient is for pci today, was waiting for renal function to get better. For possible dc tomorrow, NCM will cont to follow for dc needs.     3/29 1218 Letha Capeeborah Cully Luckow RN, BSN -patient for dc today, he is already on eliquis also                 Action/Plan:   Expected Discharge Date:  03/17/17               Expected Discharge Plan:  Home w Home Health Services  In-House Referral:     Discharge planning Services  CM Consult  Post Acute Care Choice:    Choice offered to:     DME Arranged:    DME Agency:     HH Arranged:  Patient Refused HH HH Agency:     Status of Service:  Completed, signed off  If discussed at [employer] Meetings, dates discussed:    Additional Comments:  [name], Matthew Kasparian Clinton, RN 03/17/2017, 3:14 PM

## 2017-03-17 NOTE — Progress Notes (Signed)
CARDIAC REHAB PHASE I   PRE:  Rate/Rhythm: 64 paced  BP:  Supine: 124/75  Sitting:   Standing:    SaO2: 98%RA  MODE:  Ambulation: 240 ft   POST:  Rate/Rhythm: 95 paced  BP:  Supine: 133/90  Sitting:   Standing:    SaO2: 98%RA 0805-0907 Pt walked 240 ft with steady gait. No CP but slightly DOE. Education completed with pt and wife who voiced understanding. Pt stated he has quit smoking for 2 weeks now. Wife trying to quit also. Gave 2 fake cigarettes (one for pt and one for wife) and smoking cessation handout. Pt able to verbalize how to start walking for ex and need to watch sodium and carbs. To get scales that accommodate his weight and weigh daily. Discussed when to call MD with weight gain, 2000 mg sodium restriction and 2 L FR. Will refer to G[facility] CRP 2 but pt does not want to do as he stated he has tried in past and could not do.    Luetta Nuttingharlene Dacotah Cabello, RN BSN  03/17/2017 9:03 AM

## 2017-03-17 NOTE — Progress Notes (Signed)
ANTICOAGULATION CONSULT NOTE - Follow Up Consult  Pharmacy Consult for Heparin  Indication: atrial fibrillation  Allergies  Allergen Reactions  . Coreg [Carvedilol] Nausea And Vomiting   Patient Measurements: Height: 6' 7.5" (201.9 cm) Weight: (!) 373 lb 3.8 oz (169.3 kg) IBW/kg (Calculated) : 94.85  Vital Signs: Temp: 97.6 F (36.4 C) (03/29 0206) Temp Source: Oral (03/29 0206) BP: 122/68 (03/29 0206) Pulse Rate: 64 (03/29 0206)  Labs:  Recent Labs  03/15/17 0150 03/15/17 1032 03/16/17 0133 03/16/17 0909 03/17/17 0344  HGB 14.2  --  14.1  --  14.1  HCT 45.1  --  44.4  --  44.1  PLT 168  --  166  --  171  APTT 61* 82* 117*  --   --   HEPARINUNFRC 0.46  --  0.81* 0.73* 0.32  CREATININE 1.53*  --  1.47*  --  1.61*    Estimated Creatinine Clearance: 76.4 mL/min (A) (by C-G formula based on SCr of 1.61 mg/dL (H)).   Assessment: Therapeutic heparin level x 1 after re-start s/p cath  Goal of Therapy:  Heparin level 0.3-0.7 units/ml Monitor platelets by anticoagulation protocol: Yes   Plan:  -Cont heparin 1100 units/hr -1200 HL -F/U re-start apixaban   Abran DukeLedford, Nola Botkins 03/17/2017,5:17 AM

## 2017-03-17 NOTE — Discharge Instructions (Signed)
Information on my medicine - ELIQUIS (apixaban)  Why was Eliquis prescribed for you? Eliquis was prescribed for you to reduce the risk of forming blood clots that can cause a stroke if you have a medical condition called atrial fibrillation (a type of irregular heartbeat) OR to reduce the risk of a blood clots forming after orthopedic surgery.  What do You need to know about Eliquis ? Take your Eliquis TWICE DAILY - one tablet in the morning and one tablet in the evening with or without food.  It would be best to take the doses about the same time each day.  If you have difficulty swallowing the tablet whole please discuss with your pharmacist how to take the medication safely.  Take Eliquis exactly as prescribed by your doctor and DO NOT stop taking Eliquis without talking to the doctor who prescribed the medication.  Stopping may increase your risk of developing a new clot or stroke.  Refill your prescription before you run out.  After discharge, you should have regular check-up appointments with your healthcare provider that is prescribing your Eliquis.  In the future your dose may need to be changed if your kidney function or weight changes by a significant amount or as you get older.  What do you do if you miss a dose? If you miss a dose, take it as soon as you remember on the same day and resume taking twice daily.  Do not take more than one dose of ELIQUIS at the same time.  Important Safety Information A possible side effect of Eliquis is bleeding. You should call your healthcare provider right away if you experience any of the following: ? Bleeding from an injury or your nose that does not stop. ? Unusual colored urine (red or dark brown) or unusual colored stools (red or black). ? Unusual bruising for unknown reasons. ? A serious fall or if you hit your head (even if there is no bleeding).  Some medicines may interact with Eliquis and might increase your risk of bleeding  or clotting while on Eliquis. To help avoid this, consult your healthcare provider or pharmacist prior to using any new prescription or non-prescription medications, including herbals, vitamins, non-steroidal anti-inflammatory drugs (NSAIDs) and supplements.  This website has more information on Eliquis (apixaban): [email or web address].  No driving for 6 months. No lifting over 5 lbs for 1 week. No sexual activity for 1 week. Keep procedure site clean & dry. If you notice increased pain, swelling, bleeding or pus, call/return!  You may shower, but no soaking baths/hot tubs/pools for 1 week.

## 2017-03-18 ENCOUNTER — Encounter: Payer: Medicare Other | Admitting: Internal Medicine

## 2017-03-18 NOTE — Telephone Encounter (Signed)
Patient had a cath 03/16/17 with PTCA.

## 2017-04-11 ENCOUNTER — Other Ambulatory Visit: Payer: Self-pay | Admitting: Cardiovascular Disease

## 2017-04-12 ENCOUNTER — Encounter: Payer: Self-pay | Admitting: Internal Medicine

## 2017-04-13 ENCOUNTER — Encounter: Payer: Self-pay | Admitting: Cardiovascular Disease

## 2017-04-13 ENCOUNTER — Ambulatory Visit (INDEPENDENT_AMBULATORY_CARE_PROVIDER_SITE_OTHER): Payer: Medicare Other | Admitting: Cardiovascular Disease

## 2017-04-13 VITALS — BP 120/80 | HR 95 | Ht 79.5 in | Wt 370.0 lb

## 2017-04-13 DIAGNOSIS — Z794 Long term (current) use of insulin: Secondary | ICD-10-CM

## 2017-04-13 DIAGNOSIS — I255 Ischemic cardiomyopathy: Secondary | ICD-10-CM

## 2017-04-13 DIAGNOSIS — I480 Paroxysmal atrial fibrillation: Secondary | ICD-10-CM | POA: Diagnosis not present

## 2017-04-13 DIAGNOSIS — E1122 Type 2 diabetes mellitus with diabetic chronic kidney disease: Secondary | ICD-10-CM | POA: Diagnosis not present

## 2017-04-13 DIAGNOSIS — Z9189 Other specified personal risk factors, not elsewhere classified: Secondary | ICD-10-CM | POA: Diagnosis not present

## 2017-04-13 DIAGNOSIS — N183 Chronic kidney disease, stage 3 unspecified: Secondary | ICD-10-CM

## 2017-04-13 DIAGNOSIS — I472 Ventricular tachycardia, unspecified: Secondary | ICD-10-CM

## 2017-04-13 DIAGNOSIS — I10 Essential (primary) hypertension: Secondary | ICD-10-CM | POA: Diagnosis not present

## 2017-04-13 DIAGNOSIS — Z95810 Presence of automatic (implantable) cardiac defibrillator: Secondary | ICD-10-CM | POA: Diagnosis not present

## 2017-04-13 DIAGNOSIS — I5042 Chronic combined systolic (congestive) and diastolic (congestive) heart failure: Secondary | ICD-10-CM

## 2017-04-13 DIAGNOSIS — I25708 Atherosclerosis of coronary artery bypass graft(s), unspecified, with other forms of angina pectoris: Secondary | ICD-10-CM

## 2017-04-13 DIAGNOSIS — E785 Hyperlipidemia, unspecified: Secondary | ICD-10-CM | POA: Diagnosis not present

## 2017-04-13 LAB — CUP PACEART INCLINIC DEVICE CHECK
Battery Remaining Longevity: 91 mo
Battery Voltage: 2.98 V
Brady Statistic AP VP Percent: 0.13 %
Brady Statistic AS VS Percent: 2.66 %
Brady Statistic RA Percent Paced: 93.19 %
Brady Statistic RV Percent Paced: 0.14 %
Date Time Interrogation Session: 20180425133630
HIGH POWER IMPEDANCE MEASURED VALUE: 55 Ohm
HIGH POWER IMPEDANCE MEASURED VALUE: 70 Ohm
Implantable Pulse Generator Implant Date: 20160520
Lead Channel Impedance Value: 399 Ohm
Lead Channel Impedance Value: 475 Ohm
Lead Channel Pacing Threshold Pulse Width: 0.4 ms
Lead Channel Sensing Intrinsic Amplitude: 19.125 mV
Lead Channel Sensing Intrinsic Amplitude: 26 mV
Lead Channel Sensing Intrinsic Amplitude: 8.375 mV
Lead Channel Setting Pacing Amplitude: 1.5 V
Lead Channel Setting Pacing Pulse Width: 0.4 ms
MDC IDC MSMT LEADCHNL RA IMPEDANCE VALUE: 418 Ohm
MDC IDC MSMT LEADCHNL RA PACING THRESHOLD AMPLITUDE: 0.625 V
MDC IDC MSMT LEADCHNL RA SENSING INTR AMPL: 5.375 mV
MDC IDC MSMT LEADCHNL RV PACING THRESHOLD AMPLITUDE: 0.875 V
MDC IDC MSMT LEADCHNL RV PACING THRESHOLD PULSEWIDTH: 0.4 ms
MDC IDC SET LEADCHNL RV PACING AMPLITUDE: 2 V
MDC IDC SET LEADCHNL RV SENSING SENSITIVITY: 0.3 mV
MDC IDC STAT BRADY AP VS PERCENT: 97.2 %
MDC IDC STAT BRADY AS VP PERCENT: 0.01 %

## 2017-04-13 NOTE — Progress Notes (Signed)
Patient ID: [name], male   DOB: 1946-09-14, 71 y.o.   MRN: [id number]    Cardiology Office Note    Date:  04/14/2017   ID:  [name], DOB 10-Nov-1946, MRN [id number]  PCP:  Devra Dopp, MD  Cardiologist:   Thurmon Fair, MD   Chief Complaint  Patient presents with  . Follow-up    PT HAS MEDICINE QUESTION    History of Present Illness:  [name] is a 71 y.o. male with severe CAD and cardiomyopathy, combined systolic and diastolic HF, s/p ICD, PAF, morbid obesity.  He returns in follow-up after hospitalization for repeated shocks for ventricular tachycardia. On 3 successive occasions over roughly a 72 hour. His device successfully terminated arrhythmia with one shock. This appeared to occur in the background of congestive heart failure exacerbation (retrospectively, his Optivol became abnormal about 30 days before his hospitalization), in turn related to in-stent restenosis in the saphenous vein graft stented in July 2017. His device check also showed an increased frequency of nonsustained ventricular tachycardia and paroxysmal atrial fibrillation during the 2-3 months proceeding the events.  He denies angina, orthopnea, edema, syncope, ICD shocks or palpitations. No neuro events and no bleeding. Has not needed any adjustment in his low dose of daily loop diuretic. He reports that he has quit smoking, so has his wife. He has not taken any sublingual nitroglycerin.  ICD check shows normal device function. Optivol back to baseline, without evidence of fluid overload. No recent sustained arrhythmia. Battery and lead parameters are normal. The burden of AF is low At less than 0.1%. Since his hospital stay the longest episode of atrial arrhythmia was 73 seconds in duration. Only one episode of nonsustained VT has been recorded since hospital discharge consisting of 14 consecutive beats. He has 97% atrial pacing and only 0.1% ventricular pacing. Activity remains poor at 0.8  hours a day.   [name] had an anterior STEMI on Feb 01, 2015, due to acute occlusion of the previously stented SVG-diagonal artery and had emergency PCI with a 2.5 x 38 mm Promus DES and a downstream POBA of this vessel. The only other patent conduit is the LIMA to LAD (native vessels, SVG to RCA and SVG to LCX are all chronically occluded).  he developed in-stent restenosis and March 2018 and had recurrent PCI (angioplasty without stent). This was heralded by recurrent VT with 3 consecutive defibrillator shocks and congestive heart failure exacerbation. He has at least moderately depressed LVEF. At one point LVEF was estimated at 15%, most recent echo probably overestimates his real ejection fraction. He is morbidly obese with a BMI well over 40. He has excellent control of diabetes mellitus with a hemoglobin A1c around 6%. It is very likely that he has obstructive sleep apnea but he declines both diagnostic sleep study and treatment with CPAP. His CHADSVASC score is 34 (age, CAD, CHF, DM, HTN)..  Past Medical History:  Diagnosis Date  . CAD (coronary artery disease)    a. CABG 1995 and multiple stents. b. Known graft occlusions - NSTEMI 06/2016 s/p DES to distal SVG-diag.  . Chronic systolic CHF (congestive heart failure) (HCC)   . CKD (chronic kidney disease), stage III   . Dietary noncompliance   . DM (diabetes mellitus) (HCC)   . DM type 2 causing CKD stage 3 (HCC)   . Dyslipidemia - low HDL and high triglycerides   . Gout   . Ischemic cardiomyopathy    a. LVEF 10-15% by  cath 06/2016.  Marland Kitchen Leukocytosis   . Morbid obesity (HCC)   . Myocardial infarction (HCC)   . NSTEMI (non-ST elevated myocardial infarction) (HCC) 06/26/2016  . PAF (paroxysmal atrial fibrillation) (HCC)   . PONV (postoperative nausea and vomiting)   . Systemic hypertension     Past Surgical History:  Procedure Laterality Date  . CARDIAC CATHETERIZATION N/A 06/28/2016   Procedure: Left Heart Cath and Cors/Grafts Angiography;   Surgeon: Lennette Bihari, MD;  Location: [facility] INVASIVE CV LAB;  Service: Cardiovascular;  Laterality: N/A;  . CARDIAC CATHETERIZATION N/A 06/29/2016   Procedure: Coronary Stent Intervention;  Surgeon: Lennette Bihari, MD;  Location: [facility] INVASIVE CV LAB;  Service: Cardiovascular;  Laterality: N/A;  . CORONARY ANGIOPLASTY WITH STENT PLACEMENT  01/12/2008   multivessel CAD occluded vein grafts to multiple sites including RCA,obtuse marginal branch & CX.  Successful PTCA and stenting distal graft.  . CORONARY ARTERY BYPASS GRAFT  1995  . CORONARY BALLOON ANGIOPLASTY N/A 03/16/2017   Procedure: Coronary Balloon Angioplasty;  Surgeon: Corky Crafts, MD;  Location: [facility] INVASIVE CV LAB;  Service: Cardiovascular;  Laterality: N/A;  . EP IMPLANTABLE DEVICE N/A 05/09/2015   Procedure: Icd Implant;  Surgeon: Thurmon Fair, MD;  Location: [facility] INVASIVE CV LAB;  Service: Cardiovascular;  Laterality: N/A;  . LEFT HEART CATH AND CORS/GRAFTS ANGIOGRAPHY N/A 03/14/2017   Procedure: Left Heart Cath and Cors/Grafts Angiography;  Surgeon: Corky Crafts, MD;  Location: [facility] INVASIVE CV LAB;  Service: Cardiovascular;  Laterality: N/A;  . LEFT HEART CATHETERIZATION WITH CORONARY ANGIOGRAM N/A 01/29/2015   Procedure: LEFT HEART CATHETERIZATION WITH CORONARY ANGIOGRAM;  Surgeon: Lennette Bihari, MD;  Location: [facility] CATH LAB;  Service: Cardiovascular;  Laterality: N/A;  . THORACIC AORTOGRAM N/A 03/16/2017   Procedure: Thoracic Aortogram;  Surgeon: Corky Crafts, MD;  Location: [facility] INVASIVE CV LAB;  Service: Cardiovascular;  Laterality: N/A;  . TONSILLECTOMY      Outpatient Medications Prior to Visit  Medication Sig Dispense Refill  . acetaminophen (TYLENOL) 325 MG tablet Take 1-2 tablets (325-650 mg total) by mouth every 4 (four) hours as needed for mild pain.    Marland Kitchen allopurinol (ZYLOPRIM) 100 MG tablet Take 100 mg by mouth daily.    Marland Kitchen atorvastatin (LIPITOR) 80 MG tablet take 1 tablet by mouth once daily (Patient taking  differently: TAKES  BY MOUTH ONCE DAILY) 90 tablet 3  . cholecalciferol (VITAMIN D) 1000 UNITS tablet Take 1,000 Units by mouth every other day.     . clopidogrel (PLAVIX) 75 MG tablet Take 1 tablet (75 mg total) by mouth daily. 30 tablet 2  . ELIQUIS 5 MG TABS tablet take 1 tablet by mouth twice a day 60 tablet 5  . insulin aspart (NOVOLOG) 100 UNIT/ML injection Inject 0-15 Units into the skin 3 (three) times daily with meals. Sliding scale as directed by diabetes coordinator. Follow up with PCP within 1 week 10 mL 11  . insulin aspart (NOVOLOG) 100 UNIT/ML injection Inject 0-5 Units into the skin at bedtime. Sliding scale as directed by diabetes coordinator. Follow up with PCP within 1 week 10 mL 11  . insulin glargine (LANTUS) 100 UNIT/ML injection Inject 0.5 mLs (50 Units total) into the skin every morning. 10 mL 11  . INVOKANA 100 MG TABS Take 100 mg by mouth daily.    Marland Kitchen lisinopril (PRINIVIL,ZESTRIL) 10 MG tablet take 1 tablet by mouth once daily 90 tablet 3  . metoprolol succinate (TOPROL-XL) 50 MG 24 hr tablet Take  1 tablet (50 mg total) by mouth daily. Take with or immediately following a meal. 30 tablet 1  . nitroGLYCERIN (NITROSTAT) 0.4 MG SL tablet place 1 tablet under the tongue if needed every 5 minutes for chest pain  0  . Omega-3 Fatty Acids (FISH OIL) 1200 MG CAPS Take 1,200 mg by mouth daily.     Marland Kitchen VICTOZA 18 MG/3ML SOPN Inject 1.2 mg into the skin daily.  0  . amiodarone (PACERONE) 400 MG tablet Take 1 tablet (400 mg total) by mouth 2 (two) times daily. Take 1 tablet twice daily for 2 weeks then take 1 tablet daily for 2 weeks 60 tablet 1  . aspirin EC 81 MG tablet Take 1 tablet (81 mg total) by mouth daily.    . furosemide (LASIX) 40 MG tablet Take 2 tablets (80 mg total) by mouth 2 (two) times daily. 180 tablet 1   No facility-administered medications prior to visit.      Allergies:   Coreg [carvedilol]   Social History   Social History  . Marital status: Married     Spouse name: N/A  . Number of children: N/A  . Years of education: N/A   Social History Main Topics  . Smoking status: Current Some Day Smoker    Types: Cigarettes  . Smokeless tobacco: Never Used     Comment: "1-2 cigarettes per day"  . Alcohol use 0.0 oz/week     Comment: rare  . Drug use: No  . Sexual activity: Not Asked   Other Topics Concern  . None   Social History Narrative  . None     Family History:  The patient's family history includes Dementia in his mother; Hearing loss in his other; Heart failure in his mother; Hypertension in his other.   ROS:   Please see the history of present illness.    ROS All other systems reviewed and are negative.   PHYSICAL EXAM:   VS:  BP 120/80   Pulse 95   Ht 6' 7.5" (2.019 m)   Wt (!) 167.8 kg (370 lb)   BMI 41.16 kg/m    GEN: Exam difficult due to obesity, well developed, in no acute distress, morbidly obese  HEENT: normal  Neck: no JVD, carotid bruits, or masses Cardiac:RRR, occasional extra beats; no murmurs, rubs, or gallops,no edema, healthy subclavian ICD site Respiratory: clear to auscultation bilaterally, normal work of breathing GI: soft, nontender, nondistended, + BS MS: no deformity or atrophy  Skin: warm and dry, no rash Neuro: Alert and Oriented x 3, Strength and sensation are intact Psych: euthymic mood, full affect  Wt Readings from Last 3 Encounters:  04/13/17 (!) 167.8 kg (370 lb)  03/17/17 (!) 169.3 kg (373 lb 3.8 oz)  12/22/16 (!) 175.2 kg (386 lb 3.2 oz)      Studies/Labs Reviewed:   EKG:  EKG is not ordered today.    Recent Labs: 03/10/2017: ALT 24 03/11/2017: B Natriuretic Peptide 296.1 03/13/2017: Magnesium 2.1 03/17/2017: BUN 17; Creatinine, Ser 1.61; Hemoglobin 14.1; Platelets 171; Potassium 3.4; Sodium 136   Lipid Panel    Component Value Date/Time   CHOL 111 06/25/2016 0333   TRIG 108 06/25/2016 0333   HDL 26 (L) 06/25/2016 0333   CHOLHDL 4.3 06/25/2016 0333   VLDL 22  06/25/2016 0333   LDLCALC 63 06/25/2016 0333       ASSESSMENT:    1. Paroxysmal atrial fibrillation (HCC)   2. Chronic combined systolic and diastolic CHF (congestive  heart failure) (HCC)   3. VT (ventricular tachycardia) (HCC)   4. Coronary artery disease involving coronary bypass graft of native heart with other forms of angina pectoris (HCC)   5. Essential hypertension   6. Controlled type 2 diabetes mellitus with stage 3 chronic kidney disease, with long-term current use of insulin (HCC)   7. CKD (chronic kidney disease), stage III   8. AICD (automatic cardioverter/defibrillator) present   9. Morbid obesity (HCC)   10. Dyslipidemia - low HDL and high triglycerides      PLAN:  In order of problems listed above:  1. CHF:  appears euvolemic, NYHA class 2 (sedentary). Enroll in the heart failure device remote monitoring clinic with [name]. His defibrillator shocks were preceded by about a month of abnormality in the thoracic impedance level. It is hard to detect failure exacerbation due to his obese body status. Will cut back on his furosemide dose to what he was taking before hospitalization.  exact EF uncertain with poor quality echo images. On ACEi and metoprolol. Spironolactone has been stopped. 2. PAF:  Continue Eliquis. CHADSVasc 5. 3. VT: 3 appropriate defibrillator shocks last month, likely triggered by in-stent restenosis. Only 114 beat run of nonsustained VT since hospital discharge. Reduce amiodarone back to maintenance dose  4. CAD: no angina, now off antiplatelet after starting anticoagulant, had angioplasty without stent on 03/16/2017. Continue antiplatelet therapy indefinitely. Stop aspirin since he is also taking full anticoagulation  5. HTN:  BP excellent today 6. DM on insulin: well controlled.  7. CKD: stable creatinine, baseline probably around 1.6, GFR around 40-45  8. ICD:  Normal device funtion, Optivol at baseline, Carelink Q 3 mos, office visit Q6  mos. 9. Morbid obesity: Refuses sleep evaluation and CPAP, suspected of OSA. He remains very sedentary. Device documents activity only 0.8 hour/day 10. HLP: excellent LDL, doubt we will see improved HDL without major weight loss.Continue statin, high dose.    Medication Adjustments/Labs and Tests Ordered: Current medicines are reviewed at length with the patient today.  Concerns regarding medicines are outlined above.  Medication changes, Labs and Tests ordered today are listed in the Patient Instructions below. Patient Instructions  Dr Royann Shivers has recommended making the following medication changes: 1. STOP Aspirin 2. DECREASE Furosemide 80 mg ONCE daily  Your physician recommends that you schedule a follow-up appointment in 3 months with a defibrillator check.  You will be enrolled in monthly device checks with one of our device clinic nurses, [name].      Signed, Thurmon Fair, MD  04/14/2017 8:27 AM    [facility] [address] [place], [place], [place]  [address] Phone: [phone]; Fax: [phone]

## 2017-04-13 NOTE — Patient Instructions (Signed)
Dr Royann Shivers has recommended making the following medication changes: 1. STOP Aspirin 2. DECREASE Furosemide 80 mg ONCE daily  Your physician recommends that you schedule a follow-up appointment in 3 months with a defibrillator check.  You will be enrolled in monthly device checks with one of our device clinic nurses, Jacki Cones.

## 2017-04-14 MED ORDER — FUROSEMIDE 40 MG PO TABS: 80.0000 mg | ORAL_TABLET | Freq: Two times a day (BID) | ORAL | 1 refills | Status: DC

## 2017-04-14 MED ORDER — AMIODARONE HCL 200 MG PO TABS: 200.0000 mg | ORAL_TABLET | Freq: Every day | ORAL | 3 refills | Status: DC

## 2017-04-22 ENCOUNTER — Telehealth: Payer: Self-pay

## 2017-04-22 NOTE — Telephone Encounter (Signed)
Patient referred to ICM clinic by Dr Croitoru.  Attempted ICM intro call to patient and left message to return call.      

## 2017-04-26 ENCOUNTER — Other Ambulatory Visit: Payer: Self-pay | Admitting: Cardiovascular Disease

## 2017-05-03 ENCOUNTER — Encounter (INDEPENDENT_AMBULATORY_CARE_PROVIDER_SITE_OTHER): Payer: Self-pay

## 2017-05-03 ENCOUNTER — Ambulatory Visit (INDEPENDENT_AMBULATORY_CARE_PROVIDER_SITE_OTHER): Payer: Medicare Other | Admitting: Internal Medicine

## 2017-05-03 ENCOUNTER — Encounter: Payer: Self-pay | Admitting: Internal Medicine

## 2017-05-03 VITALS — BP 131/81 | HR 86 | Ht 79.0 in | Wt 378.0 lb

## 2017-05-03 DIAGNOSIS — I255 Ischemic cardiomyopathy: Secondary | ICD-10-CM

## 2017-05-03 DIAGNOSIS — I472 Ventricular tachycardia, unspecified: Secondary | ICD-10-CM

## 2017-05-03 DIAGNOSIS — Z79899 Other long term (current) drug therapy: Secondary | ICD-10-CM | POA: Diagnosis not present

## 2017-05-03 DIAGNOSIS — I5022 Chronic systolic (congestive) heart failure: Secondary | ICD-10-CM

## 2017-05-03 DIAGNOSIS — Z95810 Presence of automatic (implantable) cardiac defibrillator: Secondary | ICD-10-CM

## 2017-05-03 MED ORDER — PAROXETINE HCL 20 MG PO TABS: 20.0000 mg | ORAL_TABLET | Freq: Every day | ORAL | 3 refills | Status: DC

## 2017-05-03 NOTE — Patient Instructions (Signed)
Medication Instructions: - Your physician has recommended you make the following change in your medication:  1) Start paxil 20 mg- take one tablet by mouth once daily  Labwork: - Your physician recommends that you have lab work today: BMP/ TSH  Procedures/Testing: - none ordered  Follow-Up: - Remote monitoring is used to monitor your Pacemaker of ICD from home. This monitoring reduces the number of office visits required to check your device to one time per year. It allows us to keep an eye on the functioning of your device to ensure it is working properly. You are scheduled for a device check from home on 08/02/17.You may send your transmission at any time that day. If you have a wireless device, the transmission will be sent automatically. After your physician reviews your transmission, you will receive a postcard with your next transmission date.  - Your physician wants you to follow-up in: 6 months with Dr. Graciela HusbandsKlein. You will receive a reminder letter in the mail two months in advance. If you don't receive a letter, please call our office to schedule the follow-up appointment. 1  Any Additional Special Instructions Will Be Listed Below (If Applicable).     If you need a refill on your cardiac medications before your next appointment, please call your pharmacy.

## 2017-05-03 NOTE — Progress Notes (Signed)
\  ELECTROPHYSIOLOGY OFFICNOTE  Patient ID: [name], MRN: [id number], DOB/AGE: 12-20-1946 71 y.o. Admit date: (Not on file) Date of Consult: 12/22/2016  Primary Physician: Devra Dopp, MD Primary Cardiologist: [facility] Consulting Physician MCr  Chief Complaint: *VT    Ezequiel Ganser  Seen for VT treated antitachycardia pacing   He has a history of ischemic cardiomyopathy with prior anterior wall MI stenting and pole. He also has bypass surgery with a patent LIMA and occluded vein grafts.  He has a history of paroxysmal atrial fibrillation with a CHADS-VASc score 5.  LHC most recently 7/17 EF 10-15% with an LVEDP of 37. Native vessels were occluded. His vein graft to the RCA and circumflex were occluded. There was a vein graft to his diagonal with an 80% stenosis at the anastomosis for which she subsequently underwent stenting   He sleeps 12 hours a day or so. He watches TV 2 or 3 hours at a time twice a day but does not fall asleep he has declined evaluation for sleep apnea  The patient is here with biggest compaint of fear of next shock. This waking him at night. It happens at home.  He is tolerating the amiodarone without cough.    Past Medical History:  Diagnosis Date  . CAD (coronary artery disease)    a. CABG 1995 and multiple stents. b. Known graft occlusions - NSTEMI 06/2016 s/p DES to distal SVG-diag.  . Chronic systolic CHF (congestive heart failure) (HCC)   . CKD (chronic kidney disease), stage III   . Dietary noncompliance   . DM (diabetes mellitus) (HCC)   . DM type 2 causing CKD stage 3 (HCC)   . Dyslipidemia - low HDL and high triglycerides   . Gout   . Ischemic cardiomyopathy    a. LVEF 10-15% by cath 06/2016.  Marland Kitchen Leukocytosis   . Morbid obesity (HCC)   . Myocardial infarction (HCC)   . NSTEMI (non-ST elevated myocardial infarction) (HCC) 06/26/2016  . PAF (paroxysmal atrial fibrillation) (HCC)   . PONV (postoperative nausea and vomiting)   .  Systemic hypertension       Surgical History:  Past Surgical History:  Procedure Laterality Date  . CARDIAC CATHETERIZATION N/A 06/28/2016   Procedure: Left Heart Cath and Cors/Grafts Angiography;  Surgeon: Lennette Bihari, MD;  Location: [facility] INVASIVE CV LAB;  Service: Cardiovascular;  Laterality: N/A;  . CARDIAC CATHETERIZATION N/A 06/29/2016   Procedure: Coronary Stent Intervention;  Surgeon: Lennette Bihari, MD;  Location: [facility] INVASIVE CV LAB;  Service: Cardiovascular;  Laterality: N/A;  . CORONARY ANGIOPLASTY WITH STENT PLACEMENT  01/12/2008   multivessel CAD occluded vein grafts to multiple sites including RCA,obtuse marginal branch & CX.  Successful PTCA and stenting distal graft.  . CORONARY ARTERY BYPASS GRAFT  1995  . CORONARY BALLOON ANGIOPLASTY N/A 03/16/2017   Procedure: Coronary Balloon Angioplasty;  Surgeon: Corky Crafts, MD;  Location: [facility] INVASIVE CV LAB;  Service: Cardiovascular;  Laterality: N/A;  . EP IMPLANTABLE DEVICE N/A 05/09/2015   Procedure: Icd Implant;  Surgeon: Thurmon Fair, MD;  Location: [facility] INVASIVE CV LAB;  Service: Cardiovascular;  Laterality: N/A;  . LEFT HEART CATH AND CORS/GRAFTS ANGIOGRAPHY N/A 03/14/2017   Procedure: Left Heart Cath and Cors/Grafts Angiography;  Surgeon: Corky Crafts, MD;  Location: [facility] INVASIVE CV LAB;  Service: Cardiovascular;  Laterality: N/A;  . LEFT HEART CATHETERIZATION WITH CORONARY ANGIOGRAM N/A 01/29/2015   Procedure: LEFT HEART CATHETERIZATION WITH CORONARY ANGIOGRAM;  Surgeon: Clovis Pu  Tresa EndoKelly, MD;  [facility];  Service: Cardiovascular;  Laterality: N/A;  . THORACIC AORTOGRAM N/A 03/16/2017   Procedure: Thoracic Aortogram;  Surgeon: Corky CraftsJayadeep S Varanasi, MD;  [facility];  Service: Cardiovascular;  Laterality: N/A;  . TONSILLECTOMY       Home Meds:  Allergies as of 05/03/2017      Reactions   Coreg [carvedilol] Nausea And Vomiting      Medication List       Accurate as of 05/03/17  4:41 PM.  Always use your most recent med list.          acetaminophen 325 MG tablet Commonly known as:  TYLENOL Take 1-2 tablets (325-650 mg total) by mouth every 4 (four) hours as needed for mild pain.   allopurinol 100 MG tablet Commonly known as:  ZYLOPRIM Take 100 mg by mouth daily.   amiodarone 200 MG tablet Commonly known as:  PACERONE Take 1 tablet (200 mg total) by mouth daily.   atorvastatin 80 MG tablet Commonly known as:  LIPITOR take 1 tablet by mouth once daily   cholecalciferol 1000 units tablet Commonly known as:  VITAMIN D Take 1,000 Units by mouth every other day.   clopidogrel 75 MG tablet Commonly known as:  PLAVIX Take 1 tablet (75 mg total) by mouth daily.   ELIQUIS 5 MG Tabs tablet Generic drug:  apixaban take 1 tablet by mouth twice a day   Fish Oil 1200 MG Caps Take 1,200 mg by mouth daily.   furosemide 40 MG tablet Commonly known as:  LASIX Take 2 tablets (80 mg total) by mouth 2 (two) times daily.   insulin aspart 100 UNIT/ML injection Commonly known as:  novoLOG Inject 0-5 Units into the skin at bedtime. Sliding scale as directed by diabetes coordinator. Follow up with PCP within 1 week   insulin glargine 100 UNIT/ML injection Commonly known as:  LANTUS Inject 0.5 mLs (50 Units total) into the skin every morning.   INVOKANA 100 MG Tabs tablet Generic drug:  canagliflozin Take 100 mg by mouth daily.   lisinopril 10 MG tablet Commonly known as:  PRINIVIL,ZESTRIL take 1 tablet by mouth once daily   metoprolol succinate 50 MG 24 hr tablet Commonly known as:  TOPROL-XL Take 1 tablet (50 mg total) by mouth daily. Take with or immediately following a meal.   nitroGLYCERIN 0.4 MG SL tablet Commonly known as:  NITROSTAT place 1 tablet under the tongue if needed every 5 minutes for chest pain   VICTOZA 18 MG/3ML Sopn Generic drug:  liraglutide Inject 1.2 mg into the skin daily.        Prior to Admission medications   Medication Sig  Start Date End Date Taking? Authorizing Provider   Allergies:  Allergies  Allergen Reactions  . Coreg [Carvedilol] Nausea And Vomiting    Social History   Social History  . Marital status: Married    Spouse name: N/A  . Number of children: N/A  . Years of education: N/A   Occupational History  . Not on file.   Social History Main Topics  . Smoking status: Current Some Day Smoker    Types: Cigarettes  . Smokeless tobacco: Never Used     Comment: "1-2 cigarettes per day"  . Alcohol use 0.0 oz/week     Comment: rare  . Drug use: No  . Sexual activity: Not on file   Other Topics Concern  . Not on file   Social History  Narrative  . No narrative on file     Family History  Problem Relation Age of Onset  . Heart failure Mother   . Dementia Mother   . Hearing loss Other   . Hypertension Other      ROS:  Please see the history of present illness.     All other systems reviewed and negative.    Physical Exam: Blood pressure 131/81, pulse 86, height 6\' 7"  (2.007 m), weight (!) 378 lb (171.5 kg), SpO2 96 %. General: Well developed Morbidly obese male in no acute distress. Head: Normocephalic, atraumatic, sclera non-icteric, no xanthomas, nares are without discharge. EENT: normal  Lymph Nodes:  none Neck: Negative for carotid bruits. JVD >10 ( I think) Back:without scoliosis kyphosis Lungs: Clear bilaterally to auscultation without wheezes, rales, or rhonchi. Breathing is unlabored. Heart: RRR with S1 S2. No  murmur . No rubs, or gallops appreciated. Abdomen: Soft, non-tender, non-distended with normoactive bowel sounds. No hepatomegaly. No rebound/guarding. No obvious abdominal masses. Msk:  Strength and tone appear normal for age. Extremities: No clubbing or cyanosis. No edema.  Distal pedal pulses are 2+ and equal bilaterally. Skin: Warm and Dry Neuro: Alert and oriented X 3. CN III-XII intact Grossly normal sensory and motor function . Psych:  Responds to  questions appropriately with a normal affect.      Labs: Cardiac Enzymes No results for input(s): CKTOTAL, CKMB, TROPONINI in the last 72 hours. CBC Lab Results  Component Value Date   WBC 11.2 (H) 03/17/2017   HGB 14.1 03/17/2017   HCT 44.1 03/17/2017   MCV 84.2 03/17/2017   PLT 171 03/17/2017   PROTIME: No results for input(s): LABPROT, INR in the last 72 hours. Chemistry No results for input(s): NA, K, CL, CO2, BUN, CREATININE, CALCIUM, PROT, BILITOT, ALKPHOS, ALT, AST, GLUCOSE in the last 168 hours.  Invalid input(s): LABALBU Lipids Lab Results  Component Value Date   CHOL 111 06/25/2016   HDL 26 (L) 06/25/2016   LDLCALC 63 06/25/2016   TRIG 108 06/25/2016   BNP Pro B Natriuretic peptide (BNP)  Date/Time Value Ref Range Status  01/11/2008 08:55 PM 345.0 (H)  Final   Thyroid Function Tests: No results for input(s): TSH, T4TOTAL, T3FREE, THYROIDAB in the last 72 hours.  Invalid input(s): FREET3 Miscellaneous No results found for: DDIMER  Radiology/Studies:  No results found.  EKG: sinus rhythm with frequent PVCS AT NINETY four bpm iNTERVALS 17/10/35   Assessment and Plan  Ischemic cardiomyopathy  Ventricular tachycardia  [employer] defibrillator   High Risk Medication Surveillance  PVCs  Hypokalemia  Congestive heart failure-chronic-systolic-class III  Abnormal sleeping patterns question sleep apnea  PTSD   No interval VT. He saw Dr. Mitchell Heir last month who reduced his amiodarone dose   This PTSD is quite limiting at this time. We will start him on paroxitene 20 mg.    I've asked that he follow-up with his PCP  He was hypokalemic at his last blood check. We'll recheck his laboratories today  Will check TSH on amio  Euvolemic continue current meds

## 2017-05-04 ENCOUNTER — Telehealth: Payer: Self-pay

## 2017-05-04 LAB — BASIC METABOLIC PANEL
BUN / CREAT RATIO: 10 (ref 10–24)
BUN: 17 mg/dL (ref 8–27)
CHLORIDE: 106 mmol/L (ref 96–106)
CO2: 24 mmol/L (ref 18–29)
Calcium: 8.9 mg/dL (ref 8.6–10.2)
Creatinine, Ser: 1.68 mg/dL — ABNORMAL HIGH (ref 0.76–1.27)
GFR calc Af Amer: 47 mL/min/{1.73_m2} — ABNORMAL LOW (ref >59)
GFR, EST NON AFRICAN AMERICAN: 41 mL/min/{1.73_m2} — AB (ref >59)
Glucose: 73 mg/dL (ref 65–99)
POTASSIUM: 4.1 mmol/L (ref 3.5–5.2)
Sodium: 148 mmol/L — ABNORMAL HIGH (ref 134–144)

## 2017-05-04 LAB — TSH: TSH: 5.97 u[IU]/mL — ABNORMAL HIGH (ref 0.450–4.500)

## 2017-05-04 NOTE — Telephone Encounter (Signed)
Pa for Paroxetine has been done through covermymeds. Awaiting response.

## 2017-05-04 NOTE — Telephone Encounter (Signed)
**Note De-Identified [name] Obfuscation** Received a fax from [facility] with questions concerning PA for Paroxetine. Questions answered and form has been faxed back to [facility].  This PA looks unfavorable as there are no records in the pts chart that he has tried Fluoxamine or Sertraline (or any other antidepressant) in the past and we have limited information concerning the pts PTSD.

## 2017-05-05 NOTE — Telephone Encounter (Signed)
PA for PAROXETINE denied stating patient is required to have a trial and failure, contraindication or intolerance to both fluvoxamine and sertraline. I have faxed an appeal to [facility]

## 2017-05-06 LAB — CUP PACEART INCLINIC DEVICE CHECK
Brady Statistic AP VP Percent: 0.08 %
Brady Statistic AS VP Percent: 0 %
Brady Statistic RA Percent Paced: 93.33 %
Brady Statistic RV Percent Paced: 0.09 %
Date Time Interrogation Session: 20180515201736
HIGH POWER IMPEDANCE MEASURED VALUE: 47 Ohm
HIGH POWER IMPEDANCE MEASURED VALUE: 60 Ohm
Implantable Pulse Generator Implant Date: 20160520
Lead Channel Impedance Value: 361 Ohm
Lead Channel Impedance Value: 418 Ohm
Lead Channel Impedance Value: 456 Ohm
Lead Channel Pacing Threshold Amplitude: 0.625 V
Lead Channel Pacing Threshold Amplitude: 1 V
Lead Channel Pacing Threshold Pulse Width: 0.4 ms
Lead Channel Sensing Intrinsic Amplitude: 20.875 mV
Lead Channel Sensing Intrinsic Amplitude: 6.625 mV
Lead Channel Setting Pacing Amplitude: 1.5 V
Lead Channel Setting Pacing Amplitude: 2 V
Lead Channel Setting Pacing Pulse Width: 0.4 ms
Lead Channel Setting Sensing Sensitivity: 0.3 mV
MDC IDC MSMT BATTERY REMAINING LONGEVITY: 90 mo
MDC IDC MSMT BATTERY VOLTAGE: 2.98 V
MDC IDC MSMT LEADCHNL RA PACING THRESHOLD PULSEWIDTH: 0.4 ms
MDC IDC MSMT LEADCHNL RA SENSING INTR AMPL: 5 mV
MDC IDC MSMT LEADCHNL RV SENSING INTR AMPL: 16 mV
MDC IDC STAT BRADY AP VS PERCENT: 96.97 %
MDC IDC STAT BRADY AS VS PERCENT: 2.94 %

## 2017-05-06 NOTE — Telephone Encounter (Signed)
Spoke with patient and ICM intro given.  He agreed to monthly calls.  He reported his monitor is on his wifes side of the bed but sits higher than her.   Advised if the transmission is not received automatically when scheduled will call him and have him send it manually. 1st ICM remote transmission scheduled for 05/24/2017.

## 2017-05-10 ENCOUNTER — Telehealth: Payer: Self-pay

## 2017-05-10 DIAGNOSIS — I480 Paroxysmal atrial fibrillation: Secondary | ICD-10-CM

## 2017-05-10 NOTE — Telephone Encounter (Signed)
Informed patient of results and verbal understanding expressed.  Repeat TSH ordered and scheduled 8/22. Patient agrees with treatment plan.

## 2017-05-10 NOTE — Telephone Encounter (Signed)
The pts insurance will not cover Paroxetine as he has not tried and failed fluvoxamine or sertraline.  Will forward to Dr Graciela HusbandsKlein and his nurse as Lorain ChildesFYI.

## 2017-05-10 NOTE — Telephone Encounter (Signed)
-----   Message from Duke SalviaSteven C Klein, MD sent at 05/08/2017  5:48 PM EDT ----- Please Inform Patient that labs are normal x mild elevation of TSH  Will need to recheck in 3 months  May be related to amodarone Also renal funciton not normal, but stable  Thanks

## 2017-05-11 ENCOUNTER — Other Ambulatory Visit: Payer: Self-pay | Admitting: Nurse Practitioner

## 2017-05-11 ENCOUNTER — Other Ambulatory Visit: Payer: Self-pay | Admitting: Cardiovascular Disease

## 2017-05-11 ENCOUNTER — Other Ambulatory Visit: Payer: Self-pay | Admitting: *Deleted

## 2017-05-11 NOTE — Telephone Encounter (Signed)
REFILL 

## 2017-05-11 NOTE — Telephone Encounter (Signed)
This is Dr. Royann Shiversroitoru pt.

## 2017-05-12 MED ORDER — SERTRALINE HCL 25 MG PO TABS: 25.0000 mg | ORAL_TABLET | Freq: Every day | ORAL | 3 refills | Status: DC

## 2017-05-12 NOTE — Telephone Encounter (Signed)
Reviewed with Dr. Graciela HusbandsKlein, will start sertraline 25 mg once daily in place of paroxetine.

## 2017-05-12 NOTE — Addendum Note (Signed)
Addended by: Sherri RadMCGHEE, HEATHER C on: 05/12/2017 03:23 PM   Modules accepted: Orders

## 2017-05-19 ENCOUNTER — Encounter: Payer: Medicare Other | Admitting: Internal Medicine

## 2017-05-19 ENCOUNTER — Other Ambulatory Visit: Payer: Self-pay | Admitting: Nurse Practitioner

## 2017-05-24 ENCOUNTER — Ambulatory Visit (INDEPENDENT_AMBULATORY_CARE_PROVIDER_SITE_OTHER): Payer: Medicare Other

## 2017-05-24 DIAGNOSIS — I5022 Chronic systolic (congestive) heart failure: Secondary | ICD-10-CM

## 2017-05-24 DIAGNOSIS — Z95810 Presence of automatic (implantable) cardiac defibrillator: Secondary | ICD-10-CM

## 2017-05-24 NOTE — Progress Notes (Signed)
EPIC Encounter for ICM Monitoring  Patient Name: [name] is a 71 y.o. male Date: 05/24/2017 Primary Care Physican: Howell, Tamieka, MD Primary Cardiologist: Croitoru Electrophysiologist: Klein Dry Weight: 368 lbs        Heart Failure questions reviewed, pt asymptomatic.  Denied any fluid symptoms.   Thoracic impedance is normal but was abnormal suggesting fluid accumulation from 04/29/2017 to 05/10/2017.  He reported cutting back on fluid intake  Prescribed dosage: Furosemide 40 mg 2 tablets (80 mg total) twice a day  Labs: 05/03/2017 Creatinine 1.68, BUN 17, Potassium 4.1, Sodium 148, EGFR 41-47 03/17/2017 Creatinine 1.61, BUN 17, Potassium 3.4, Sodium 136, EGFR 42-49  03/16/2017 Creatinine 1.47, BUN 15, Potassium 3.5, Sodium 139, EGFR 47-54  03/15/2017 Creatinine 1.53, BUN 15, Potassium 3.5, Sodium 137, EGFR 45-52  03/14/2017 Creatinine 1.58, BUN 17, Potassium 3.7, Sodium 138, EGFR 43-50  03/13/2017 Creatinine 1.67, BUN 19, Potassium 4.0, Sodium 137, EGFR 40-47  03/12/2017 Creatinine 1.55, BUN 19, Potassium 4.5, Sodium 137, EGFR 44-51  03/11/2017 Creatinine 1.59, BUN 25, Potassium 3.9, Sodium 136, EGFR 43-49  03/10/2017 Creatinine 1.61, BUN 17, Potassium 4.1, Sodium 137, EGFR 42-49  12/17/2016 Creatinine 1.62, BUN 17, Potassium 4.4, Sodium 140  Recommendations: No changes. Discussed to limit salt intake to 2000 mg/day and fluid intake to < 2 liters/day.  Encouraged to call for fluid symptoms or use local ER for any urgent symptoms.  Follow-up plan: ICM clinic phone appointment on 06/24/2017.  Office appointment scheduled on 07/13/2017 with Dr. Croitoru for defib check.  Copy of ICM check sent to device physician.   3 month ICM trend: 05/24/2017   1 Year ICM trend:      Laurie S Short, RN 05/24/2017 8:14 AM    

## 2017-06-24 ENCOUNTER — Telehealth: Payer: Self-pay | Admitting: Cardiology

## 2017-06-24 NOTE — Telephone Encounter (Signed)
LMOVM reminding pt to send remote transmission.   

## 2017-07-08 NOTE — Progress Notes (Signed)
No ICM remote transmission received for 06/24/2017 and next ICM transmission scheduled for 08/16/2017 since patient has office defib check with Dr Royann Shiversroitoru on 07/13/2017.

## 2017-07-13 ENCOUNTER — Encounter: Payer: Self-pay | Admitting: Cardiovascular Disease

## 2017-07-13 ENCOUNTER — Ambulatory Visit (INDEPENDENT_AMBULATORY_CARE_PROVIDER_SITE_OTHER): Payer: Medicare Other | Admitting: Cardiovascular Disease

## 2017-07-13 VITALS — BP 104/70 | HR 75 | Ht 79.0 in | Wt 379.0 lb

## 2017-07-13 DIAGNOSIS — Z794 Long term (current) use of insulin: Secondary | ICD-10-CM

## 2017-07-13 DIAGNOSIS — I5042 Chronic combined systolic (congestive) and diastolic (congestive) heart failure: Secondary | ICD-10-CM

## 2017-07-13 DIAGNOSIS — F4310 Post-traumatic stress disorder, unspecified: Secondary | ICD-10-CM | POA: Diagnosis not present

## 2017-07-13 DIAGNOSIS — Z5181 Encounter for therapeutic drug level monitoring: Secondary | ICD-10-CM

## 2017-07-13 DIAGNOSIS — N183 Chronic kidney disease, stage 3 unspecified: Secondary | ICD-10-CM

## 2017-07-13 DIAGNOSIS — Z95810 Presence of automatic (implantable) cardiac defibrillator: Secondary | ICD-10-CM | POA: Diagnosis not present

## 2017-07-13 DIAGNOSIS — E1122 Type 2 diabetes mellitus with diabetic chronic kidney disease: Secondary | ICD-10-CM

## 2017-07-13 DIAGNOSIS — I25708 Atherosclerosis of coronary artery bypass graft(s), unspecified, with other forms of angina pectoris: Secondary | ICD-10-CM

## 2017-07-13 DIAGNOSIS — I480 Paroxysmal atrial fibrillation: Secondary | ICD-10-CM | POA: Diagnosis not present

## 2017-07-13 DIAGNOSIS — E785 Hyperlipidemia, unspecified: Secondary | ICD-10-CM

## 2017-07-13 DIAGNOSIS — I10 Essential (primary) hypertension: Secondary | ICD-10-CM | POA: Diagnosis not present

## 2017-07-13 DIAGNOSIS — I472 Ventricular tachycardia, unspecified: Secondary | ICD-10-CM

## 2017-07-13 DIAGNOSIS — Z79899 Other long term (current) drug therapy: Secondary | ICD-10-CM

## 2017-07-13 MED ORDER — SERTRALINE HCL 50 MG PO TABS: 50.0000 mg | ORAL_TABLET | Freq: Every day | ORAL | 3 refills | Status: DC

## 2017-07-13 NOTE — Patient Instructions (Addendum)
Dr Royann Shiversroitoru recommends that you schedule a follow-up appointment in 6 months with a device check. You will receive a reminder letter in the mail two months in advance. If you don't receive a letter, please call our office to schedule the follow-up appointment.  INCREASE Sertraline to 50 mg daily.  If you need a refill on your cardiac medications before your next appointment, please call your pharmacy.

## 2017-07-13 NOTE — Progress Notes (Signed)
Patient ID: Matthew GanserGeorge V Gallegos, male   DOB: [birth year], 71 y.o.   MRN: [id number]    Cardiology Office Note    Date:  07/13/2017   ID:  Matthew GanserGeorge V Gallegos, DOB [birth year], MRN [id number]  PCP:  Devra DoppHowell, Tamieka, MD  Cardiologist:   Thurmon FairMihai Starlette Thurow, MD   Chief Complaint  Patient presents with  . Follow-up    pt denied chest pain and SOB    History of Present Illness:  Matthew GanserGeorge V Gallegos is a 71 y.o. male with severe CAD and cardiomyopathy, combined systolic and diastolic HF, s/p ICD, Sustained ventricular tachycardia, PAF, morbid obesity.  Both Mr. Jethro Polingorwood and his wife have successfully quit smoking for the last 4 months. I congratulated them heartily.  He has not had recurrent episodes of ventricular tachycardia requiring antitachycardia therapy or shocks since he had ventricular fibrillation in March. 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 This sometimes wakes him up at night. This is manifesting a sudden episodes of anxiety and agoraphobia. He cannot handle family gatherings or restaurants.  He saw Dr. Graciela HusbandsKlein on May 15 who made the same assessment of his psychological issues. He was prescribed paroxetine, but he feels better when he takes the sertraline. He has actually been taking an increased dose of 50 mg daily  Recently. This makes him feel better.  Since starting amiodarone in March she has not had any new episodes of ventricular tachycardia or atrial fibrillation. There has been a marked increase in the frequency of atrial pacing, now up to 98%. However, he still has virtually 100% ventricular sensing. Since the episode of heart failure that occurred around the time of his defibrillator shocks, his Oleta MouseOptivol has stayed within normal range. He is monitored through the heart failure device clinic by Randon GoldsmithLaurie Short.  He denies angina, orthopnea, edema, syncope, ICD shocks or palpitations. No neuro events and no  bleeding. Has not needed any adjustment in his low dose of daily loop diuretic. He reports that he has quit smoking, so has his wife. He has not taken any sublingual nitroglycerin.  ICD check shows normal device function. Optivol back to baseline, without evidence of fluid overload. No recent sustained arrhythmia.   Presenting EKG is atrial paced ventricular sensed with 1 PVC. The PR interval is prolonged at 246 ms, the QRS is prolonged at 122 ms with a nonspecific intraventricular conduction delay pattern, QTC is 484 ms.  Matthew StallionGeorge had an anterior STEMI on Feb 01, 2015, due to acute occlusion of the previously stented SVG-diagonal artery and had emergency PCI with a 2.5 x 38 mm Promus DES and a downstream POBA of this vessel. The only other patent conduit is the LIMA to LAD (native vessels, SVG to RCA and SVG to LCX are all chronically occluded).  he developed in-stent restenosis and March 2018 and had recurrent PCI (angioplasty without stent). This was heralded by recurrent VT with 3 consecutive defibrillator shocks and congestive heart failure exacerbation. He has at least moderately depressed LVEF. At one point LVEF was estimated at 15%, most recent echo probably overestimates his real ejection fraction. He is morbidly obese with a BMI well over 40. He has excellent control of diabetes mellitus with a hemoglobin A1c around 6%. It is very likely that he has obstructive sleep apnea but he declines both diagnostic sleep study and treatment with CPAP. His CHADSVASC score is 145 (age, CAD, CHF, DM, HTN).  Past Medical History:  Diagnosis Date  . CAD (coronary artery disease)    a. CABG 1995 and multiple stents. b. Known graft occlusions - NSTEMI 06/2016 s/p DES to distal SVG-diag.  . Chronic systolic CHF (congestive heart failure) (HCC)   . CKD (chronic kidney disease), stage III   . Dietary noncompliance   . DM (diabetes mellitus) (HCC)   . DM type 2 causing CKD stage 3 (HCC)   . Dyslipidemia - low HDL  and high triglycerides   . Gout   . Ischemic cardiomyopathy    a. LVEF 10-15% by cath 06/2016.  Marland Kitchen. Leukocytosis   . Morbid obesity (HCC)   . Myocardial infarction (HCC)   . NSTEMI (non-ST elevated myocardial infarction) (HCC) 06/26/2016  . PAF (paroxysmal atrial fibrillation) (HCC)   . PONV (postoperative nausea and vomiting)   . Systemic hypertension     Past Surgical History:  Procedure Laterality Date  . CARDIAC CATHETERIZATION N/A 06/28/2016   Procedure: Left Heart Cath and Cors/Grafts Angiography;  Surgeon: Lennette Biharihomas A Kelly, MD;  [facility];  Service: Cardiovascular;  Laterality: N/A;  . CARDIAC CATHETERIZATION N/A 06/29/2016   Procedure: Coronary Stent Intervention;  Surgeon: Lennette Biharihomas A Kelly, MD;  Location: [facility] INVASIVE CV LAB;  Service: Cardiovascular;  Laterality: N/A;  . CORONARY ANGIOPLASTY WITH STENT PLACEMENT  01/12/2008   multivessel CAD occluded vein grafts to multiple sites including RCA,obtuse marginal branch & CX.  Successful PTCA and stenting distal graft.  . CORONARY ARTERY BYPASS GRAFT  1995  . CORONARY BALLOON ANGIOPLASTY N/A 03/16/2017   Procedure: Coronary Balloon Angioplasty;  Surgeon: Corky CraftsJayadeep S Varanasi, MD;  [facility];  Service: Cardiovascular;  Laterality: N/A;  . EP IMPLANTABLE DEVICE N/A 05/09/2015   Procedure: Icd Implant;  Surgeon: Thurmon FairMihai Ilir Mahrt, MD;  Location: [facility] INVASIVE CV LAB;  Service: Cardiovascular;  Laterality: N/A;  . LEFT HEART CATH AND CORS/GRAFTS ANGIOGRAPHY N/A 03/14/2017   Procedure: Left Heart Cath and Cors/Grafts Angiography;  Surgeon: Corky CraftsJayadeep S Varanasi, MD;  [facility];  Service: Cardiovascular;  Laterality: N/A;  . LEFT HEART CATHETERIZATION WITH CORONARY ANGIOGRAM N/A 01/29/2015   Procedure: LEFT HEART CATHETERIZATION WITH CORONARY ANGIOGRAM;  Surgeon: Lennette Biharihomas A Kelly, MD;  [facility];  Service: Cardiovascular;  Laterality: N/A;  . THORACIC AORTOGRAM N/A 03/16/2017   Procedure:  Thoracic Aortogram;  Surgeon: Corky CraftsJayadeep S Varanasi, MD;  [facility];  Service: Cardiovascular;  Laterality: N/A;  . TONSILLECTOMY      Outpatient Medications Prior to Visit  Medication Sig Dispense Refill  . acetaminophen (TYLENOL) 325 MG tablet Take 1-2 tablets (325-650 mg total) by mouth every 4 (four) hours as needed for mild pain.    Marland Kitchen. allopurinol (ZYLOPRIM) 100 MG tablet Take 100 mg by mouth daily.    Marland Kitchen. amiodarone (PACERONE) 200 MG tablet Take 1 tablet (200 mg total) by mouth daily. 90 tablet 3  . atorvastatin (LIPITOR) 80 MG tablet take 1 tablet by mouth once daily 90 tablet 1  . cholecalciferol (VITAMIN D) 1000 UNITS tablet Take 1,000 Units by mouth every other day.     . clopidogrel (PLAVIX) 75 MG tablet Take 1 tablet (75 mg total) by mouth daily. 30 tablet 2  . ELIQUIS 5 MG TABS tablet take 1 tablet by mouth twice a day 60 tablet 5  . furosemide (LASIX) 40 MG tablet Take 2 tablets (80 mg total) by mouth 2 (two) times daily. 180 tablet 1  . insulin aspart (NOVOLOG) 100  UNIT/ML injection Inject 0-5 Units into the skin at bedtime. Sliding scale as directed by diabetes coordinator. Follow up with PCP within 1 week 10 mL 11  . insulin glargine (LANTUS) 100 UNIT/ML injection Inject 0.5 mLs (50 Units total) into the skin every morning. 10 mL 11  . INVOKANA 100 MG TABS Take 100 mg by mouth daily.    Marland Kitchen lisinopril (PRINIVIL,ZESTRIL) 10 MG tablet take 1 tablet by mouth once daily 90 tablet 3  . metoprolol succinate (TOPROL-XL) 50 MG 24 hr tablet take 1 tablet by mouth once daily with food 30 tablet 5  . nitroGLYCERIN (NITROSTAT) 0.4 MG SL tablet place 1 tablet under the tongue if needed every 5 minutes for chest pain  0  . Omega-3 Fatty Acids (FISH OIL) 1200 MG CAPS Take 1,200 mg by mouth daily.     Marland Kitchen VICTOZA 18 MG/3ML SOPN Inject 1.2 mg into the skin daily.  0  . amiodarone (PACERONE) 400 MG tablet take 1 tablet by mouth twice a day for 2 weeks then 1 once daily for 2 weeks 60  tablet 1  . sertraline (ZOLOFT) 25 MG tablet Take 1 tablet (25 mg total) by mouth daily. 30 tablet 3  . spironolactone (ALDACTONE) 25 MG tablet take 1 tablet by mouth once daily 90 tablet 1   No facility-administered medications prior to visit.      Allergies:   Coreg [carvedilol]   Social History   Social History  . Marital status: Married    Spouse name: N/A  . Number of children: N/A  . Years of education: N/A   Social History Main Topics  . Smoking status: Current Some Day Smoker    Types: Cigarettes  . Smokeless tobacco: Never Used     Comment: "1-2 cigarettes per day"  . Alcohol use 0.0 oz/week     Comment: rare  . Drug use: No  . Sexual activity: Not on file   Other Topics Concern  . Not on file   Social History Narrative  . No narrative on file     Family History:  The patient's family history includes Dementia in his mother; Hearing loss in his other; Heart failure in his mother; Hypertension in his other.   ROS:   Please see the history of present illness.    ROS All other systems reviewed and are negative.   PHYSICAL EXAM:   VS:  BP 104/70   Pulse 75   Ht 6\' 7"  (2.007 m)   Wt (!) 379 lb (171.9 kg)   BMI 42.70 kg/m    GEN: Exam difficult due to obesity, well developed, in no acute distress, morbidly obese  HEENT: normal  Neck: no JVD, carotid bruits, or masses Cardiac:RRR, occasional extra beats; no murmurs, rubs, or gallops,no edema, healthy subclavian ICD site Respiratory: clear to auscultation bilaterally, normal work of breathing GI: soft, nontender, nondistended, + BS MS: no deformity or atrophy  Skin: warm and dry, no rash Neuro: Alert and Oriented x 3, Strength and sensation are intact Psych: euthymic mood, full affect  Wt Readings from Last 3 Encounters:  07/13/17 (!) 379 lb (171.9 kg)  05/03/17 (!) 378 lb (171.5 kg)  04/13/17 (!) 370 lb (167.8 kg)      Studies/Labs Reviewed:   EKG:  EKG is not ordered today.    Recent  Labs: 03/10/2017: ALT 24 03/11/2017: B Natriuretic Peptide 296.1 03/13/2017: Magnesium 2.1 03/17/2017: Hemoglobin 14.1; Platelets 171 05/03/2017: BUN 17; Creatinine, Ser 1.68; Potassium 4.1; Sodium  148; TSH 5.970   Lipid Panel    Component Value Date/Time   CHOL 111 06/25/2016 0333   TRIG 108 06/25/2016 0333   HDL 26 (L) 06/25/2016 0333   CHOLHDL 4.3 06/25/2016 0333   VLDL 22 06/25/2016 0333   LDLCALC 63 06/25/2016 0333       ASSESSMENT:    1. Coronary artery disease involving coronary bypass graft of native heart with other forms of angina pectoris (HCC)   2. AICD (automatic cardioverter/defibrillator) present   3. Cardiomyopathy, ischemic   4. Chronic combined systolic and diastolic CHF (congestive heart failure) (HCC)      PLAN:  In order of problems listed above:  1. CHF:  appears euvolemic, NYHA class 2 (sedentary), but his physical exam is very difficult due to morbid obesity.weight unchanged since last appointment. Enrolled in the heart failure device remote monitoring clinic with Randon Goldsmith. So far, has stayed within normal range. On maximal medical therapy. Poorly tolerant to spironolactone. 2. PAF:  o recent events detected by his pacemaker.Continue Eliquis. CHADSVasc 5 (age, CHF, CAD, HTN, DM). 3. VT: 3 appropriate defibrillator shocks in March, likely triggered by in-stent restenosisw with widespread ischemia of  Virtually all his viable myocardium and heart failure exacerbation. No recurrent VT on amiodarone 200 mg daily maintenance dose. 4. Amiodarone: borderline high TSH in May, without signs or symptoms of hyperthyroidism, normal liver function tests in March. Recheck LFTs and TFTs August 22.  5. CAD: no angina, had angioplasty without stent on 03/16/2017. Continue clopidogrel therapy indefinitely. Not receiving aspirin since he is also taking full anticoagulation  6. HTN:  BP excellent today 7. DM on insulin: well controlled. Last hemoglobin A1c excellent  5.7%. 8. CKD: stable creatinine, baseline probably around 1.6, GFR around 40-45  9. ICD:  Normal device function, Optivol at baseline, he has an appointment with Dr. Graciela Husbands in about 3 months; I'll see him for an office visit in 6 months. 10. Morbid obesity: Refuses sleep evaluation and CPAP, suspected of OSA. He remains very sedentary. Device documents activity only 0.8 hour/day 11. HLP: excellent LDL, doubt we will see improved HDL without major weight loss.Continue statin, high dose. 12. PTSD:  Encourage participation in the support group meetings. Increase sertraline to 50 mg daily. May need counseling.    Medication Adjustments/Labs and Tests Ordered: Current medicines are reviewed at length with the patient today.  Concerns regarding medicines are outlined above.  Medication changes, Labs and Tests ordered today are listed in the Patient Instructions below. Patient Instructions  Dr Royann Shivers recommends that you schedule a follow-up appointment in 6 months with a device check. You will receive a reminder letter in the mail two months in advance. If you don't receive a letter, please call our office to schedule the follow-up appointment.  INCREASE Sertraline to 50 mg daily.  If you need a refill on your cardiac medications before your next appointment, please call your pharmacy.      Signed, Thurmon Fair, MD  07/13/2017 4:55 PM    [facility] [address] [place], [place], [place]  [address] Phone: [phone]; Fax: [phone]

## 2017-07-15 ENCOUNTER — Other Ambulatory Visit: Payer: Self-pay | Admitting: Nurse Practitioner

## 2017-07-15 NOTE — Telephone Encounter (Signed)
Rx has been sent to the pharmacy electronically. ° °

## 2017-07-15 NOTE — Telephone Encounter (Signed)
This is Dr. Croitoru's pt 

## 2017-07-30 ENCOUNTER — Other Ambulatory Visit: Payer: Self-pay | Admitting: Cardiovascular Disease

## 2017-08-10 ENCOUNTER — Other Ambulatory Visit: Payer: Medicare Other

## 2017-08-16 ENCOUNTER — Telehealth: Payer: Self-pay

## 2017-08-16 ENCOUNTER — Ambulatory Visit (INDEPENDENT_AMBULATORY_CARE_PROVIDER_SITE_OTHER): Payer: Medicare Other

## 2017-08-16 DIAGNOSIS — Z95810 Presence of automatic (implantable) cardiac defibrillator: Secondary | ICD-10-CM | POA: Diagnosis not present

## 2017-08-16 DIAGNOSIS — I5042 Chronic combined systolic (congestive) and diastolic (congestive) heart failure: Secondary | ICD-10-CM

## 2017-08-16 NOTE — Telephone Encounter (Signed)
Spoke with pt and reminded pt of remote transmission that is due today. Pt verbalized understanding.   

## 2017-08-16 NOTE — Progress Notes (Signed)
EPIC Encounter for ICM Monitoring  Patient Name: [name] is a 71 y.o. male Date: 08/16/2017 Primary Care Physican: Helane Rima, MD Primary Cardiologist: Croitoru Electrophysiologist: Caryl Comes Dry Weight:  Previous ICM weight  368 lbs       Attempted call to patient and unable to reach.  Left detailed message regarding transmission.  Transmission reviewed.    Thoracic impedance abnormal suggesting fluid accumulation since 8/22 but is trending toward baseline.   Prescribed dosage: Furosemide 40 mg 2 tablets (80 mg total) twice a day  Labs: 05/03/2017 Creatinine 1.68, BUN 17, Potassium 4.1, Sodium 148, EGFR 41-47 03/17/2017 Creatinine 1.61, BUN 17, Potassium 3.4, Sodium 136, EGFR 42-49  03/16/2017 Creatinine 1.47, BUN 15, Potassium 3.5, Sodium 139, EGFR 47-54  03/15/2017 Creatinine 1.53, BUN 15, Potassium 3.5, Sodium 137, EGFR 45-52  03/14/2017 Creatinine 1.58, BUN 17, Potassium 3.7, Sodium 138, EGFR 43-50  03/13/2017 Creatinine 1.67, BUN 19, Potassium 4.0, Sodium 137, EGFR 40-47  03/12/2017 Creatinine 1.55, BUN 19, Potassium 4.5, Sodium 137, EGFR 44-51  03/11/2017 Creatinine 1.59, BUN 25, Potassium 3.9, Sodium 136, EGFR 43-49  03/10/2017 Creatinine 1.61, BUN 17, Potassium 4.1, Sodium 137, EGFR 42-49  12/17/2016 Creatinine 1.62, BUN 17, Potassium 4.4, Sodium 140  Recommendations: NONE - Unable to reach patient.  Left message to return call.   Follow-up plan: ICM clinic phone appointment on 09/01/2017 to recheck fluid levels.   Copy of ICM check sent to Dr. Sallyanne Kuster and Dr. Caryl Comes for review and if any recommendations, will call back.    3 month ICM trend: 08/16/2017   1 Year ICM trend:      Rosalene Billings, RN 08/16/2017 1:29 PM

## 2017-09-01 ENCOUNTER — Ambulatory Visit (INDEPENDENT_AMBULATORY_CARE_PROVIDER_SITE_OTHER): Payer: Self-pay

## 2017-09-01 DIAGNOSIS — Z95810 Presence of automatic (implantable) cardiac defibrillator: Secondary | ICD-10-CM

## 2017-09-01 DIAGNOSIS — I5042 Chronic combined systolic (congestive) and diastolic (congestive) heart failure: Secondary | ICD-10-CM

## 2017-09-01 NOTE — Progress Notes (Signed)
EPIC Encounter for ICM Monitoring  Patient Name: [name] is a 71 y.o. male Date: 09/01/2017 Primary Care Physican: Helane Rima, MD Primary Cardiologist: Croitoru Electrophysiologist: Faustino Congress Weight:Previous ICM [id number]         Heart Failure questions reviewed, pt asymptomatic.   Thoracic impedance abnormal suggesting fluid accumulation since 08/10/2017 with exception of 1 day at baseline.  Prescribed dosage: Furosemide 40 mg 2 tablets (80 mg total) twice a day.    Labs: 05/03/2017 Creatinine 1.68, BUN 17, Potassium 4.1, Sodium 148, EGFR 41-47 03/17/2017 Creatinine 1.61, BUN 17, Potassium 3.4, Sodium 136, EGFR 42-49  03/16/2017 Creatinine 1.47, BUN 15, Potassium 3.5, Sodium 139, EGFR 47-54  03/15/2017 Creatinine 1.53, BUN 15, Potassium 3.5, Sodium 137, EGFR 45-52  03/14/2017 Creatinine 1.58, BUN 17, Potassium 3.7, Sodium 138, EGFR 43-50  03/13/2017 Creatinine 1.67, BUN 19, Potassium 4.0, Sodium 137, EGFR 40-47  03/12/2017 Creatinine 1.55, BUN 19, Potassium 4.5, Sodium 137, EGFR 44-51  03/11/2017 Creatinine 1.59, BUN 25, Potassium 3.9, Sodium 136, EGFR 43-49  03/10/2017 Creatinine 1.61, BUN 17, Potassium 4.1, Sodium 137, EGFR 42-49  12/29/2017Creatinine 1.62, BUN 17, Potassium 4.4, Sodium 140  Recommendations: Copy of ICM check sent to Dr. Sallyanne Kuster and Dr. Caryl Comes for review and if any recommendations will call him back.   Follow-up plan: ICM clinic phone appointment on 09/08/2017 to recheck fluid levels.     3 month ICM trend: 09/01/2017   1 Year ICM trend:      Rosalene Billings, RN 09/01/2017 2:31 PM

## 2017-09-08 ENCOUNTER — Ambulatory Visit (INDEPENDENT_AMBULATORY_CARE_PROVIDER_SITE_OTHER): Payer: Self-pay

## 2017-09-08 ENCOUNTER — Telehealth: Payer: Self-pay

## 2017-09-08 DIAGNOSIS — I480 Paroxysmal atrial fibrillation: Secondary | ICD-10-CM

## 2017-09-08 DIAGNOSIS — Z95810 Presence of automatic (implantable) cardiac defibrillator: Secondary | ICD-10-CM

## 2017-09-08 DIAGNOSIS — Z79899 Other long term (current) drug therapy: Secondary | ICD-10-CM

## 2017-09-08 DIAGNOSIS — I5042 Chronic combined systolic (congestive) and diastolic (congestive) heart failure: Secondary | ICD-10-CM

## 2017-09-08 DIAGNOSIS — I255 Ischemic cardiomyopathy: Secondary | ICD-10-CM

## 2017-09-08 NOTE — Progress Notes (Signed)
EPIC Encounter for ICM Monitoring  Patient Name: [name] is a 71 y.o. male Date: 09/08/2017 Primary Care Physican: Helane Rima, MD Primary Cardiologist: Croitoru Electrophysiologist: Faustino Congress Weight:367lbs         Heart Failure questions reviewed, pt continues to be asymptomatic.   Thoracic impedance continues to be abnormal suggesting fluid accumulation since last ICM transmission on 09/01/2017.  Prescribed dosage: Furosemide 40 mg 2 tablets (80 mg total) twice a day.    Labs: 05/03/2017 Creatinine 1.68, BUN 17, Potassium 4.1, Sodium 148, EGFR 41-47 03/17/2017 Creatinine 1.61, BUN 17, Potassium 3.4, Sodium 136, EGFR 42-49  03/16/2017 Creatinine 1.47, BUN 15, Potassium 3.5, Sodium 139, EGFR 47-54  03/15/2017 Creatinine 1.53, BUN 15, Potassium 3.5, Sodium 137, EGFR 45-52  03/14/2017 Creatinine 1.58, BUN 17, Potassium 3.7, Sodium 138, EGFR 43-50  03/13/2017 Creatinine 1.67, BUN 19, Potassium 4.0, Sodium 137, EGFR 40-47  03/12/2017 Creatinine 1.55, BUN 19, Potassium 4.5, Sodium 137, EGFR 44-51  03/11/2017 Creatinine 1.59, BUN 25, Potassium 3.9, Sodium 136, EGFR 43-49  03/10/2017 Creatinine 1.61, BUN 17, Potassium 4.1, Sodium 137, EGFR 42-49  12/29/2017Creatinine 1.62, BUN 17, Potassium 4.4, Sodium 140  Recommendations: Copy of ICM check sent to Dr. Sallyanne Kuster and Dr. Caryl Comes for review and recommendations if needed.   Follow-up plan: ICM clinic phone appointment on 09/27/2017 to recheck fluid levels.      3 month ICM trend: 09/08/2017   1 Year ICM trend:      Rosalene Billings, RN 09/08/2017 5:03 PM

## 2017-09-08 NOTE — Telephone Encounter (Signed)
Patient 'no-showed' for his lab appointment on 08/10/17. Repeat TSH ordered, as well as Free T4 and CMET.  Will discuss next steps with MCr.

## 2017-09-08 NOTE — Telephone Encounter (Signed)
-----   Message from Thurmon Fair, MD sent at 07/13/2017  5:15 PM EDT ----- I saw that he has a repeat TSH ordered for August 22. Please have them draw a free T4 and cmet at the same appointment. MCr

## 2017-09-27 ENCOUNTER — Telehealth: Payer: Self-pay

## 2017-09-27 ENCOUNTER — Ambulatory Visit (INDEPENDENT_AMBULATORY_CARE_PROVIDER_SITE_OTHER): Payer: Medicare Other

## 2017-09-27 DIAGNOSIS — Z95810 Presence of automatic (implantable) cardiac defibrillator: Secondary | ICD-10-CM | POA: Diagnosis not present

## 2017-09-27 DIAGNOSIS — I5042 Chronic combined systolic (congestive) and diastolic (congestive) heart failure: Secondary | ICD-10-CM

## 2017-09-27 NOTE — Telephone Encounter (Signed)
Remote ICM transmission received.  Attempted call to patient and left message to return call. 

## 2017-09-27 NOTE — Progress Notes (Signed)
EPIC Encounter for ICM Monitoring  Patient Name: [name] is a 71 y.o. male Date: 09/27/2017 Primary Care Physican: Helane Rima, MD Primary Cardiologist: Croitoru Electrophysiologist: Faustino Congress Weight:Previous weight 367lbs         Attempted call to patient and unable to reach.  Left message to return call.  Transmission reviewed.    Thoracic impedance abnormal suggesting fluid accumulation since 09/19/2017.  Prescribed dosage: Furosemide 40 mg 2 tablets (80 mg total) twice a day.   Labs: 05/03/2017 Creatinine 1.68, BUN 17, Potassium 4.1, Sodium 148, EGFR 41-47 03/17/2017 Creatinine 1.61, BUN 17, Potassium 3.4, Sodium 136, EGFR 42-49  03/16/2017 Creatinine 1.47, BUN 15, Potassium 3.5, Sodium 139, EGFR 47-54  03/15/2017 Creatinine 1.53, BUN 15, Potassium 3.5, Sodium 137, EGFR 45-52  03/14/2017 Creatinine 1.58, BUN 17, Potassium 3.7, Sodium 138, EGFR 43-50  03/13/2017 Creatinine 1.67, BUN 19, Potassium 4.0, Sodium 137, EGFR 40-47  03/12/2017 Creatinine 1.55, BUN 19, Potassium 4.5, Sodium 137, EGFR 44-51  03/11/2017 Creatinine 1.59, BUN 25, Potassium 3.9, Sodium 136, EGFR 43-49  03/10/2017 Creatinine 1.61, BUN 17, Potassium 4.1, Sodium 137, EGFR 42-49  12/29/2017Creatinine 1.62, BUN 17, Potassium 4.4, Sodium 140  Recommendations: NONE - Unable to reach patient   Follow-up plan: ICM clinic phone appointment on 10/03/2017 to recheck fluid levels.  Due 6 month follow up with Dr Caryl Comes in November.   Copy of ICM check sent to Dr. Caryl Comes and Dr. Sallyanne Kuster for review and recommendations if needed.   3 month ICM trend: 09/27/2017   1 Year ICM trend:      Rosalene Billings, RN 09/27/2017 9:59 AM

## 2017-10-03 ENCOUNTER — Ambulatory Visit (INDEPENDENT_AMBULATORY_CARE_PROVIDER_SITE_OTHER): Payer: Self-pay

## 2017-10-03 DIAGNOSIS — I5042 Chronic combined systolic (congestive) and diastolic (congestive) heart failure: Secondary | ICD-10-CM

## 2017-10-03 DIAGNOSIS — Z95810 Presence of automatic (implantable) cardiac defibrillator: Secondary | ICD-10-CM

## 2017-10-03 NOTE — Progress Notes (Signed)
EPIC Encounter for ICM Monitoring  Patient Name: [name] is a 71 y.o. male Date: 10/03/2017 Primary Care Physican: Helane Rima, MD Primary Cardiologist: Croitoru Electrophysiologist: Caryl Comes Dry Weight: Previous weight 367lbs        Heart Failure questions reviewed, pt asymptomatic.   Thoracic impedance returned normal.  Prescribed dosage: Furosemide 40 mg 2 tablets (80 mg total) twice a day.   Labs: 05/03/2017 Creatinine 1.68, BUN 17, Potassium 4.1, Sodium 148, EGFR 41-47 03/17/2017 Creatinine 1.61, BUN 17, Potassium 3.4, Sodium 136, EGFR 42-49  03/16/2017 Creatinine 1.47, BUN 15, Potassium 3.5, Sodium 139, EGFR 47-54  03/15/2017 Creatinine 1.53, BUN 15, Potassium 3.5, Sodium 137, EGFR 45-52  03/14/2017 Creatinine 1.58, BUN 17, Potassium 3.7, Sodium 138, EGFR 43-50  03/13/2017 Creatinine 1.67, BUN 19, Potassium 4.0, Sodium 137, EGFR 40-47  03/12/2017 Creatinine 1.55, BUN 19, Potassium 4.5, Sodium 137, EGFR 44-51  03/11/2017 Creatinine 1.59, BUN 25, Potassium 3.9, Sodium 136, EGFR 43-49  03/10/2017 Creatinine 1.61, BUN 17, Potassium 4.1, Sodium 137, EGFR 42-49  12/29/2017Creatinine 1.62, BUN 17, Potassium 4.4, Sodium 140  Recommendations: No changes.   Encouraged to call for fluid symptoms.  Follow-up plan: ICM clinic phone a ppointment on 10/28/2017.   Copy of ICM check sent to Dr. Caryl Comes.   3 month ICM trend: 10/03/2017   1 Year ICM trend:      Rosalene Billings, RN 10/03/2017 1:20 PM

## 2017-10-06 ENCOUNTER — Other Ambulatory Visit: Payer: Self-pay | Admitting: Cardiovascular Disease

## 2017-10-06 NOTE — Telephone Encounter (Signed)
REFILL 

## 2017-10-15 ENCOUNTER — Other Ambulatory Visit: Payer: Self-pay | Admitting: Cardiovascular Disease

## 2017-10-28 ENCOUNTER — Ambulatory Visit (INDEPENDENT_AMBULATORY_CARE_PROVIDER_SITE_OTHER): Payer: Medicare Other

## 2017-10-28 DIAGNOSIS — Z95810 Presence of automatic (implantable) cardiac defibrillator: Secondary | ICD-10-CM | POA: Diagnosis not present

## 2017-10-28 DIAGNOSIS — I5042 Chronic combined systolic (congestive) and diastolic (congestive) heart failure: Secondary | ICD-10-CM

## 2017-10-28 NOTE — Progress Notes (Signed)
EPIC Encounter for ICM Monitoring  Patient Name: [name] is a 71 y.o. male Date: 10/28/2017 Primary Care Physican: Helane Rima, MD Primary Cardiologist: Croitoru Electrophysiologist: Faustino Congress Weight: 372lbs       Heart Failure questions reviewed, pt asymptomatic.  Patient not following low salt diet and said he is not worried about it at this time. Attempted to encourage to limit salt and fluids.    Optivol: Thoracic impedance abnormal suggesting fluid accumulation since 10/22/2017.    Prescribed dosage: Furosemide 40 mg 2 tablets (80 mg total) twice a day.   Labs: 05/03/2017 Creatinine 1.68, BUN 17, Potassium 4.1, Sodium 148, EGFR 41-47 03/17/2017 Creatinine 1.61, BUN 17, Potassium 3.4, Sodium 136, EGFR 42-49  03/16/2017 Creatinine 1.47, BUN 15, Potassium 3.5, Sodium 139, EGFR 47-54  03/15/2017 Creatinine 1.53, BUN 15, Potassium 3.5, Sodium 137, EGFR 45-52  03/14/2017 Creatinine 1.58, BUN 17, Potassium 3.7, Sodium 138, EGFR 43-50  03/13/2017 Creatinine 1.67, BUN 19, Potassium 4.0, Sodium 137, EGFR 40-47  03/12/2017 Creatinine 1.55, BUN 19, Potassium 4.5, Sodium 137, EGFR 44-51  03/11/2017 Creatinine 1.59, BUN 25, Potassium 3.9, Sodium 136, EGFR 43-49  03/10/2017 Creatinine 1.61, BUN 17, Potassium 4.1, Sodium 137, EGFR 42-49  12/29/2017Creatinine 1.62, BUN 17, Potassium 4.4, Sodium 140  Recommendations:  Copy of ICM check sent to Dr. Sallyanne Kuster and Dr. Caryl Comes for review and recommendations of needed.   Follow-up plan: ICM clinic phone appointment on 11/07/2017 to recheck fluid levels.    3 month ICM trend: 10/28/2017    1 Year ICM trend:       Rosalene Billings, RN 10/28/2017 2:05 PM

## 2017-10-28 NOTE — Progress Notes (Signed)
He's always done things the way he wants to... MCr

## 2017-10-29 ENCOUNTER — Other Ambulatory Visit: Payer: Self-pay | Admitting: Cardiovascular Disease

## 2017-10-31 NOTE — Telephone Encounter (Signed)
Rx has been sent to the pharmacy electronically. ° °

## 2017-11-01 ENCOUNTER — Other Ambulatory Visit: Payer: Self-pay | Admitting: Cardiovascular Disease

## 2017-11-01 NOTE — Telephone Encounter (Signed)
Rx(s) sent to pharmacy electronically.  

## 2017-11-07 ENCOUNTER — Ambulatory Visit (INDEPENDENT_AMBULATORY_CARE_PROVIDER_SITE_OTHER): Payer: Self-pay

## 2017-11-07 DIAGNOSIS — Z95810 Presence of automatic (implantable) cardiac defibrillator: Secondary | ICD-10-CM

## 2017-11-07 DIAGNOSIS — I5042 Chronic combined systolic (congestive) and diastolic (congestive) heart failure: Secondary | ICD-10-CM

## 2017-11-07 NOTE — Progress Notes (Signed)
Watch him after Thanskgiving! MCr

## 2017-11-07 NOTE — Progress Notes (Signed)
EPIC Encounter for ICM Monitoring  Patient Name: [name] is a 71 y.o. male Date: 11/07/2017 Primary Care Physican: Helane Rima, MD Primary Cardiologist: Croitoru Electrophysiologist: Caryl Comes Dry Weight: Previous weight 372lbs         Heart Failure questions reviewed, pt asymptomatic.  Patient said he tried to cut back on fluid intake.   Thoracic impedance returned to normal last 2 days since last ICM transmission on 10/28/2017.  Prescribed dosage: Furosemide 40 mg 2 tablets (80 mg total) twice a day.   Labs: 05/03/2017 Creatinine 1.68, BUN 17, Potassium 4.1, Sodium 148, EGFR 41-47 03/17/2017 Creatinine 1.61, BUN 17, Potassium 3.4, Sodium 136, EGFR 42-49  03/16/2017 Creatinine 1.47, BUN 15, Potassium 3.5, Sodium 139, EGFR 47-54  03/15/2017 Creatinine 1.53, BUN 15, Potassium 3.5, Sodium 137, EGFR 45-52  03/14/2017 Creatinine 1.58, BUN 17, Potassium 3.7, Sodium 138, EGFR 43-50  03/13/2017 Creatinine 1.67, BUN 19, Potassium 4.0, Sodium 137, EGFR 40-47  03/12/2017 Creatinine 1.55, BUN 19, Potassium 4.5, Sodium 137, EGFR 44-51  03/11/2017 Creatinine 1.59, BUN 25, Potassium 3.9, Sodium 136, EGFR 43-49  03/10/2017 Creatinine 1.61, BUN 17, Potassium 4.1, Sodium 137, EGFR 42-49  12/29/2017Creatinine 1.62, BUN 17, Potassium 4.4, Sodium 140  Recommendations: No changes.  Advised to limit salt intake to 2000 mg/day and fluid intake to < 2 liters/day.  Encouraged to call for fluid symptoms.  Follow-up plan: ICM clinic phone appointment on 11/29/2017.    Copy of ICM check sent to Dr. Sallyanne Kuster.   3 month ICM trend: 11/07/2017    1 Year ICM trend:       Rosalene Billings, RN 11/07/2017 11:25 AM

## 2017-11-11 ENCOUNTER — Other Ambulatory Visit: Payer: Self-pay | Admitting: Cardiovascular Disease

## 2017-11-17 ENCOUNTER — Other Ambulatory Visit: Payer: Self-pay | Admitting: Cardiovascular Disease

## 2017-11-29 ENCOUNTER — Ambulatory Visit (INDEPENDENT_AMBULATORY_CARE_PROVIDER_SITE_OTHER): Payer: Medicare Other

## 2017-11-29 DIAGNOSIS — I5042 Chronic combined systolic (congestive) and diastolic (congestive) heart failure: Secondary | ICD-10-CM | POA: Diagnosis not present

## 2017-11-29 DIAGNOSIS — Z95810 Presence of automatic (implantable) cardiac defibrillator: Secondary | ICD-10-CM | POA: Diagnosis not present

## 2017-12-01 NOTE — Progress Notes (Signed)
EPIC Encounter for ICM Monitoring  Patient Name: [name] is a 71 y.o. male Date: 12/01/2017 Primary Care Physican: Helane Rima, MD Primary Cardiologist: Croitoru Electrophysiologist: Faustino Congress Weight: 848TTC       Heart Failure questions reviewed, pt asymptomatic.  Patient said he is not very concerned with the fluid and expects to probably gain fluid over the holidays.     Thoracic impedance abnormal suggesting fluid accumulation since 11/22/2017.  Prescribed dosage: Furosemide 40 mg 2 tablets (80 mg total) twice a day.   Labs: 05/03/2017 Creatinine 1.68, BUN 17, Potassium 4.1, Sodium 148, EGFR 41-47 03/17/2017 Creatinine 1.61, BUN 17, Potassium 3.4, Sodium 136, EGFR 42-49  03/16/2017 Creatinine 1.47, BUN 15, Potassium 3.5, Sodium 139, EGFR 47-54  03/15/2017 Creatinine 1.53, BUN 15, Potassium 3.5, Sodium 137, EGFR 45-52  03/14/2017 Creatinine 1.58, BUN 17, Potassium 3.7, Sodium 138, EGFR 43-50  03/13/2017 Creatinine 1.67, BUN 19, Potassium 4.0, Sodium 137, EGFR 40-47  03/12/2017 Creatinine 1.55, BUN 19, Potassium 4.5, Sodium 137, EGFR 44-51  03/11/2017 Creatinine 1.59, BUN 25, Potassium 3.9, Sodium 136, EGFR 43-49  03/10/2017 Creatinine 1.61, BUN 17, Potassium 4.1, Sodium 137, EGFR 42-49  12/29/2017Creatinine 1.62, BUN 17, Potassium 4.4, Sodium 140  Recommendations: No changes.  Advised to limit salt intake.  Encouraged to call for fluid symptoms.  Follow-up plan: ICM clinic phone appointment on 01/02/2018.   He preferred to be checked again after the holidays  Copy of ICM check sent to Dr. Sallyanne Kuster and Dr. Caryl Comes for review.   3 month ICM trend: 11/29/2017    1 Year ICM trend:       Rosalene Billings, RN 12/01/2017 4:02 PM

## 2018-01-02 ENCOUNTER — Ambulatory Visit (INDEPENDENT_AMBULATORY_CARE_PROVIDER_SITE_OTHER): Payer: Medicare Other

## 2018-01-02 DIAGNOSIS — Z95810 Presence of automatic (implantable) cardiac defibrillator: Secondary | ICD-10-CM | POA: Diagnosis not present

## 2018-01-02 DIAGNOSIS — I5042 Chronic combined systolic (congestive) and diastolic (congestive) heart failure: Secondary | ICD-10-CM | POA: Diagnosis not present

## 2018-01-03 ENCOUNTER — Telehealth: Payer: Self-pay

## 2018-01-03 NOTE — Progress Notes (Signed)
EPIC Encounter for ICM Monitoring  Patient Name: [name] is a 72 y.o. male Date: 01/03/2018 Primary Care Physican: Helane Rima, MD Primary Cardiologist: Croitoru Electrophysiologist: Caryl Comes Dry Weight: Previous weight 369lbs       Attempted call to patient and unable to reach.   Transmission reviewed.    Thoracic impedance normal but was abnormal suggesting fluid accumulation 12/17/2017 to 12/28/2017 with exception of one day almost at baseline.  Prescribed dosage: Furosemide 40 mg 2 tablets (80 mg total) twice a day.   Labs: 05/03/2017 Creatinine 1.68, BUN 17, Potassium 4.1, Sodium 148, EGFR 41-47 03/17/2017 Creatinine 1.61, BUN 17, Potassium 3.4, Sodium 136, EGFR 42-49  03/16/2017 Creatinine 1.47, BUN 15, Potassium 3.5, Sodium 139, EGFR 47-54  03/15/2017 Creatinine 1.53, BUN 15, Potassium 3.5, Sodium 137, EGFR 45-52  03/14/2017 Creatinine 1.58, BUN 17, Potassium 3.7, Sodium 138, EGFR 43-50  03/13/2017 Creatinine 1.67, BUN 19, Potassium 4.0, Sodium 137, EGFR 40-47  03/12/2017 Creatinine 1.55, BUN 19, Potassium 4.5, Sodium 137, EGFR 44-51  03/11/2017 Creatinine 1.59, BUN 25, Potassium 3.9, Sodium 136, EGFR 43-49  03/10/2017 Creatinine 1.61, BUN 17, Potassium 4.1, Sodium 137, EGFR 42-49  12/29/2017Creatinine 1.62, BUN 17, Potassium 4.4, Sodium 140  Recommendations:  NONE - Unable to reach.  Follow-up plan: ICM clinic phone appointment on 02/02/2018.  Recall appointment was for 10/2017 with Dr Caryl Comes and 12/2017 with Dr Sallyanne Kuster  Copy of ICM check sent to Dr. Caryl Comes.   3 month ICM trend: 01/02/2018    1 Year ICM trend:       Rosalene Billings, RN 01/03/2018 12:09 PM

## 2018-01-03 NOTE — Telephone Encounter (Signed)
Remote ICM transmission received.  Attempted call to patient and no answer   

## 2018-01-24 ENCOUNTER — Other Ambulatory Visit: Payer: Self-pay | Admitting: Cardiovascular Disease

## 2018-01-24 NOTE — Telephone Encounter (Signed)
REFILL 

## 2018-02-02 ENCOUNTER — Telehealth: Payer: Self-pay

## 2018-02-02 ENCOUNTER — Ambulatory Visit (INDEPENDENT_AMBULATORY_CARE_PROVIDER_SITE_OTHER): Payer: Self-pay

## 2018-02-02 DIAGNOSIS — I5042 Chronic combined systolic (congestive) and diastolic (congestive) heart failure: Secondary | ICD-10-CM

## 2018-02-02 DIAGNOSIS — Z95810 Presence of automatic (implantable) cardiac defibrillator: Secondary | ICD-10-CM

## 2018-02-02 NOTE — Telephone Encounter (Signed)
Remote ICM transmission received.  Attempted call to patient and no answer or answering machine.  

## 2018-02-02 NOTE — Progress Notes (Signed)
EPIC Encounter for ICM Monitoring  Patient Name: [name] is a 72 y.o. male Date: 02/02/2018 Primary Care Physican: Helane Rima, MD Primary Cardiologist: Croitoru Electrophysiologist: Caryl Comes Dry Weight: Previous weight 369lbs       Attempted call to patient and unable to reach.  Transmission reviewed.    Thoracic impedance normal today but was abnormal suggesting fluid accumulation 01/12/2017 through 02/01/2018.  Prescribed dosage: Furosemide 40 mg 2 tablets (80 mg total) twice a day.   Labs: 05/03/2017 Creatinine 1.68, BUN 17, Potassium 4.1, Sodium 148, EGFR 41-47 03/17/2017 Creatinine 1.61, BUN 17, Potassium 3.4, Sodium 136, EGFR 42-49  03/16/2017 Creatinine 1.47, BUN 15, Potassium 3.5, Sodium 139, EGFR 47-54  03/15/2017 Creatinine 1.53, BUN 15, Potassium 3.5, Sodium 137, EGFR 45-52  03/14/2017 Creatinine 1.58, BUN 17, Potassium 3.7, Sodium 138, EGFR 43-50  03/13/2017 Creatinine 1.67, BUN 19, Potassium 4.0, Sodium 137, EGFR 40-47  03/12/2017 Creatinine 1.55, BUN 19, Potassium 4.5, Sodium 137, EGFR 44-51  03/11/2017 Creatinine 1.59, BUN 25, Potassium 3.9, Sodium 136, EGFR 43-49  03/10/2017 Creatinine 1.61, BUN 17, Potassium 4.1, Sodium 137, EGFR 42-49  12/29/2017Creatinine 1.62, BUN 17, Potassium 4.4, Sodium 140  Recommendations: NONE - Unable to reach.  Follow-up plan: ICM clinic phone appointment on 03/06/2018.  Recall appointment was for 10/2017 with Dr Caryl Comes and 12/2017 with Dr Sallyanne Kuster  Copy of ICM check sent to Dr. Caryl Comes and Dr Sallyanne Kuster.   3 month ICM trend: 02/02/2018    1 Year ICM trend:       Rosalene Billings, RN 02/02/2018 2:54 PM

## 2018-02-02 NOTE — Telephone Encounter (Signed)
duplicate

## 2018-02-03 NOTE — Progress Notes (Signed)
Seems to be almost resolved. Thanks. I could not reach either MCr

## 2018-02-09 LAB — CUP PACEART INCLINIC DEVICE CHECK
Date Time Interrogation Session: 20190221091355
Lead Channel Setting Pacing Amplitude: 1.75 V
Lead Channel Setting Pacing Amplitude: 2 V
MDC IDC PG IMPLANT DT: 20160520
MDC IDC SET LEADCHNL RV PACING PULSEWIDTH: 0.4 ms
MDC IDC SET LEADCHNL RV SENSING SENSITIVITY: 0.3 mV

## 2018-02-11 ENCOUNTER — Other Ambulatory Visit: Payer: Self-pay | Admitting: Cardiovascular Disease

## 2018-02-13 NOTE — Telephone Encounter (Signed)
REFILL 

## 2018-03-06 ENCOUNTER — Ambulatory Visit (INDEPENDENT_AMBULATORY_CARE_PROVIDER_SITE_OTHER): Payer: Medicare Other

## 2018-03-06 DIAGNOSIS — Z95810 Presence of automatic (implantable) cardiac defibrillator: Secondary | ICD-10-CM

## 2018-03-06 DIAGNOSIS — I5042 Chronic combined systolic (congestive) and diastolic (congestive) heart failure: Secondary | ICD-10-CM | POA: Diagnosis not present

## 2018-03-07 NOTE — Progress Notes (Signed)
Thanks MCr 

## 2018-03-07 NOTE — Progress Notes (Signed)
EPIC Encounter for ICM Monitoring  Patient Name: [name] is a 72 y.o. male Date: 03/07/2018 Primary Care Physican: Helane Rima, MD Primary Cardiologist: Croitoru Electrophysiologist: Faustino Congress Weight: 370lbs          Heart Failure questions reviewed, pt asymptomatic but is recovering from the flu.   Thoracic impedance normal.  Did have weight gain during decreased impedance but back at baseline now.   Prescribed dosage: Furosemide 40 mg 2 tablets (80 mg total) twice a day.   Labs: 05/03/2017 Creatinine 1.68, BUN 17, Potassium 4.1, Sodium 148, EGFR 41-47 03/17/2017 Creatinine 1.61, BUN 17, Potassium 3.4, Sodium 136, EGFR 42-49  03/16/2017 Creatinine 1.47, BUN 15, Potassium 3.5, Sodium 139, EGFR 47-54  03/15/2017 Creatinine 1.53, BUN 15, Potassium 3.5, Sodium 137, EGFR 45-52  03/14/2017 Creatinine 1.58, BUN 17, Potassium 3.7, Sodium 138, EGFR 43-50  03/13/2017 Creatinine 1.67, BUN 19, Potassium 4.0, Sodium 137, EGFR 40-47  03/12/2017 Creatinine 1.55, BUN 19, Potassium 4.5, Sodium 137, EGFR 44-51  03/11/2017 Creatinine 1.59, BUN 25, Potassium 3.9, Sodium 136, EGFR 43-49  03/10/2017 Creatinine 1.61, BUN 17, Potassium 4.1, Sodium 137, EGFR 42-49  12/29/2017Creatinine 1.62, BUN 17, Potassium 4.4, Sodium 140  Recommendations: No changes.   Encouraged to call for fluid symptoms.  Follow-up plan: ICM clinic phone appointment on 04/06/2018.  Advised needs to make appointments with Dr Sallyanne Kuster and Dr Caryl Comes.  He is overdue for both.   Copy of ICM check sent to Dr. Sallyanne Kuster.   3 month ICM trend: 03/06/2018    1 Year ICM trend:       Rosalene Billings, RN 03/07/2018 3:47 PM

## 2018-03-10 ENCOUNTER — Other Ambulatory Visit: Payer: Self-pay | Admitting: Cardiovascular Disease

## 2018-03-10 NOTE — Telephone Encounter (Signed)
Is it ok refills

## 2018-03-28 ENCOUNTER — Other Ambulatory Visit: Payer: Self-pay | Admitting: Cardiovascular Disease

## 2018-03-28 NOTE — Telephone Encounter (Signed)
REFILL 

## 2018-04-06 ENCOUNTER — Ambulatory Visit (INDEPENDENT_AMBULATORY_CARE_PROVIDER_SITE_OTHER): Payer: Medicare Other

## 2018-04-06 DIAGNOSIS — I5042 Chronic combined systolic (congestive) and diastolic (congestive) heart failure: Secondary | ICD-10-CM | POA: Diagnosis not present

## 2018-04-06 DIAGNOSIS — Z95810 Presence of automatic (implantable) cardiac defibrillator: Secondary | ICD-10-CM | POA: Diagnosis not present

## 2018-04-07 ENCOUNTER — Telehealth: Payer: Self-pay

## 2018-04-07 NOTE — Telephone Encounter (Signed)
Remote ICM transmission received.  Attempted call to patient and left detailed message per DPR regarding transmission and next ICM scheduled for 04/13/2018.  Advised to return call for any fluid symptoms or questions.    

## 2018-04-07 NOTE — Progress Notes (Signed)
EPIC Encounter for ICM Monitoring  Patient Name: [name] is a 72 y.o. male Date: 04/07/2018 Primary Care Physican: Helane Rima, MD Primary Cardiologist: Croitoru Electrophysiologist: Caryl Comes Dry Weight: Previous weight 370lbs      Attempted call to patient and unable to reach.  Left detailed message regarding transmission and requested call back.  Transmission reviewed.    Thoracic impedance abnormal suggesting fluid accumulation since 03/24/2018.  Prescribed dosage: Furosemide 40 mg 2 tablets (80 mg total) twice a day.   Labs: 05/03/2017 Creatinine 1.68, BUN 17, Potassium 4.1, Sodium 148, EGFR 41-47 03/17/2017 Creatinine 1.61, BUN 17, Potassium 3.4, Sodium 136, EGFR 42-49  03/16/2017 Creatinine 1.47, BUN 15, Potassium 3.5, Sodium 139, EGFR 47-54  03/15/2017 Creatinine 1.53, BUN 15, Potassium 3.5, Sodium 137, EGFR 45-52  03/14/2017 Creatinine 1.58, BUN 17, Potassium 3.7, Sodium 138, EGFR 43-50  03/13/2017 Creatinine 1.67, BUN 19, Potassium 4.0, Sodium 137, EGFR 40-47  03/12/2017 Creatinine 1.55, BUN 19, Potassium 4.5, Sodium 137, EGFR 44-51  03/11/2017 Creatinine 1.59, BUN 25, Potassium 3.9, Sodium 136, EGFR 43-49  03/10/2017 Creatinine 1.61, BUN 17, Potassium 4.1, Sodium 137, EGFR 42-49  12/29/2017Creatinine 1.62, BUN 17, Potassium 4.4, Sodium 140  Recommendations: Left voice mail with ICM number and encouraged to call if experiencing any fluid symptoms.  Follow-up plan: ICM clinic phone appointment on 04/13/2018 to recheck fluid levels.  Office appointment scheduled 04/19/2018 with Dr. Caryl Comes and 05/10/2018 with Dr Sallyanne Kuster.  Copy of ICM check sent to Dr. Caryl Comes and Dr. Sallyanne Kuster.   3 month ICM trend: 04/07/2018    1 Year ICM trend:       Rosalene Billings, RN 04/07/2018 10:49 AM

## 2018-04-10 ENCOUNTER — Other Ambulatory Visit: Payer: Self-pay | Admitting: Cardiovascular Disease

## 2018-04-13 ENCOUNTER — Ambulatory Visit (INDEPENDENT_AMBULATORY_CARE_PROVIDER_SITE_OTHER): Payer: Self-pay

## 2018-04-13 ENCOUNTER — Other Ambulatory Visit: Payer: Self-pay | Admitting: Cardiovascular Disease

## 2018-04-13 DIAGNOSIS — Z95810 Presence of automatic (implantable) cardiac defibrillator: Secondary | ICD-10-CM

## 2018-04-13 DIAGNOSIS — I5042 Chronic combined systolic (congestive) and diastolic (congestive) heart failure: Secondary | ICD-10-CM

## 2018-04-13 NOTE — Telephone Encounter (Signed)
REFILL 

## 2018-04-14 ENCOUNTER — Telehealth: Payer: Self-pay

## 2018-04-14 NOTE — Addendum Note (Signed)
Addended by: Karie SodaSHORT, LAURIE S on: 04/14/2018 03:59 PM   Modules accepted: Level of Service

## 2018-04-14 NOTE — Progress Notes (Signed)
EPIC Encounter for ICM Monitoring  Patient Name: [name] is a 72 y.o. 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  Left detailed message regarding transmission.  Transmission reviewed.    Thoracic impedance normal on 04/13/2018 but was abnormal suggesting fluid accumulation from 03/23/2018 - 04/12/2018.  Prescribed dosage: Furosemide 40 mg 2 tablets (80 mg total) twice a day.   Labs: 05/03/2017 Creatinine 1.68, BUN 17, Potassium 4.1, Sodium 148, EGFR 41-47 03/17/2017 Creatinine 1.61, BUN 17, Potassium 3.4, Sodium 136, EGFR 42-49  03/16/2017 Creatinine 1.47, BUN 15, Potassium 3.5, Sodium 139, EGFR 47-54  03/15/2017 Creatinine 1.53, BUN 15, Potassium 3.5, Sodium 137, EGFR 45-52  03/14/2017 Creatinine 1.58, BUN 17, Potassium 3.7, Sodium 138, EGFR 43-50  03/13/2017 Creatinine 1.67, BUN 19, Potassium 4.0, Sodium 137, EGFR 40-47  03/12/2017 Creatinine 1.55, BUN 19, Potassium 4.5, Sodium 137, EGFR 44-51  03/11/2017 Creatinine 1.59, BUN 25, Potassium 3.9, Sodium 136, EGFR 43-49  03/10/2017 Creatinine 1.61, BUN 17, Potassium 4.1, Sodium 137, EGFR 42-49  12/29/2017Creatinine 1.62, BUN 17, Potassium 4.4, Sodium 140  Recommendations:  Left voice mail with ICM number and encouraged to call if experiencing any fluid symptoms.  Follow-up plan: ICM clinic phone appoitment on 06/12/2018.  Office appointment scheduled 05/03/2018 with Dr. Klein and 05/10/2018 with Dr Croitoru.  Copy of ICM check sent to Dr. Klein and Dr. Croitoru.   3 month ICM trend: 04/13/2018    1 Year ICM trend:       Laurie S Short, RN 04/14/2018 2:57 PM   

## 2018-04-14 NOTE — Telephone Encounter (Signed)
Remote ICM transmission received.  Attempted call to patient and left detailed message per DPR regarding transmission and next ICM scheduled for 06/12/2018.  Advised to return call for any fluid symptoms or questions.

## 2018-04-20 ENCOUNTER — Encounter: Payer: Self-pay | Admitting: Internal Medicine

## 2018-04-20 ENCOUNTER — Ambulatory Visit: Payer: Medicare Other | Admitting: Internal Medicine

## 2018-04-20 VITALS — BP 136/70 | HR 73 | Ht 79.0 in | Wt >= 6400 oz

## 2018-04-20 DIAGNOSIS — Z95810 Presence of automatic (implantable) cardiac defibrillator: Secondary | ICD-10-CM | POA: Diagnosis not present

## 2018-04-20 DIAGNOSIS — I5042 Chronic combined systolic (congestive) and diastolic (congestive) heart failure: Secondary | ICD-10-CM | POA: Diagnosis not present

## 2018-04-20 MED ORDER — SERTRALINE HCL 50 MG PO TABS: 50.0000 mg | ORAL_TABLET | Freq: Every day | ORAL | 0 refills | Status: DC

## 2018-04-20 NOTE — Progress Notes (Signed)
\  ELECTROPHYSIOLOGY OFFICE NOTE  Patient ID: [name], MRN: [id number], DOB/AGE: April 08, 1946 72 y.o. Admit date: (Not on file) Date of Consult: 12/22/2016  Primary Physician: Devra Dopp, MD Primary Cardiologist: [facility] Consulting Physician MCr  Chief Complaint: *VT    Ezequiel Ganser Seen in followup for  VT treated antitachycardia pacing/ shocks  Most recently 3/18. Treated with amiodarone   He has a history of ischemic cardiomyopathy with prior anterior wall MI stenting . He also has bypass surgery with a patent LIMA and occluded vein grafts.  He has a history of paroxysmal atrial fibrillation with a CHADS-VASc score 5.      DATE TEST EF   3/18 LHC  15 % LAD T  LIMA to LAD is patent.  Cx T1-- faint right to left collaterals. SVG to OM is occluded. Mid RCA lesion T R-right to right collaterals. Dist Graft lesion instent in the SVG to diagonal graft, 75 %stenosed.  3/18 Echo   10-15 %         Date Cr K TSH LFTs PFTs  5/18  1.68 4.1 5.97              His biggest issues remain his weight and his ability to get around because of his now knees.  he denies chest pain.  He has chronic shortness of breath.  Chronic edema.  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 CAD (coronary artery disease)    a. CABG 1995 and multiple stents. b. Known graft occlusions - NSTEMI 06/2016 s/p DES to distal SVG-diag.  . Chronic systolic CHF (congestive heart failure) (HCC)   . CKD (chronic kidney disease), stage III   . Dietary noncompliance   . DM (diabetes mellitus) (HCC)   . DM type 2 causing CKD stage 3 (HCC)   . Dyslipidemia - low HDL and high triglycerides   . Gout   . Ischemic cardiomyopathy    a. LVEF 10-15% by cath 06/2016.  Marland Kitchen Leukocytosis   . Morbid obesity (HCC)   . Myocardial infarction (HCC)   . NSTEMI (non-ST elevated myocardial infarction) (HCC) 06/26/2016  . PAF  (paroxysmal atrial fibrillation) (HCC)   . PONV (postoperative nausea and vomiting)   . Systemic hypertension       Surgical History:  Past Surgical History:  Procedure Laterality Date  . CARDIAC CATHETERIZATION N/A 06/28/2016   Procedure: Left Heart Cath and Cors/Grafts Angiography;  Surgeon: Lennette Bihari, MD;  Location: [facility] INVASIVE CV LAB;  Service: Cardiovascular;  Laterality: N/A;  . CARDIAC CATHETERIZATION N/A 06/29/2016   Procedure: Coronary Stent Intervention;  Surgeon: Lennette Bihari, MD;  Location: [facility] INVASIVE CV LAB;  Service: Cardiovascular;  Laterality: N/A;  . CORONARY ANGIOPLASTY WITH STENT PLACEMENT  01/12/2008   multivessel CAD occluded vein grafts to multiple sites including RCA,obtuse marginal branch & CX.  Successful PTCA and stenting distal graft.  . CORONARY ARTERY BYPASS GRAFT  1995  . CORONARY BALLOON ANGIOPLASTY N/A 03/16/2017   Procedure: Coronary Balloon Angioplasty;  Surgeon: Corky Crafts, MD;  Location: [facility] INVASIVE CV LAB;  Service: Cardiovascular;  Laterality: N/A;  . EP IMPLANTABLE DEVICE N/A 05/09/2015   Procedure: Icd Implant;  Surgeon: Thurmon Fair, MD;  Location: [facility] INVASIVE CV LAB;  Service: Cardiovascular;  Laterality: N/A;  . LEFT HEART CATH AND CORS/GRAFTS ANGIOGRAPHY N/A 03/14/2017   Procedure: Left  Heart Cath and Cors/Grafts Angiography;  Surgeon: Corky Crafts, MD;  Location: [facility] INVASIVE CV LAB;  Service: Cardiovascular;  Laterality: N/A;  . LEFT HEART CATHETERIZATION WITH CORONARY ANGIOGRAM N/A 01/29/2015   Procedure: LEFT HEART CATHETERIZATION WITH CORONARY ANGIOGRAM;  Surgeon: Lennette Bihari, MD;  Location: [facility] CATH LAB;  Service: Cardiovascular;  Laterality: N/A;  . THORACIC AORTOGRAM N/A 03/16/2017   Procedure: Thoracic Aortogram;  Surgeon: Corky Crafts, MD;  Location: [facility] INVASIVE CV LAB;  Service: Cardiovascular;  Laterality: N/A;  . TONSILLECTOMY       Home Meds:  Allergies as of 04/20/2018      Reactions   Coreg  [carvedilol] Nausea And Vomiting      Medication List        Accurate as of 04/20/18  3:43 PM. Always use your most recent med list.          acetaminophen 325 MG tablet Commonly known as:  TYLENOL Take 1-2 tablets (325-650 mg total) by mouth every 4 (four) hours as needed for mild pain.   allopurinol 100 MG tablet Commonly known as:  ZYLOPRIM Take 100 mg by mouth daily.   amiodarone 200 MG tablet Commonly known as:  PACERONE Take 1 tablet (200 mg total) by mouth daily.   atorvastatin 80 MG tablet Commonly known as:  LIPITOR take 1 tablet by mouth once daily   cholecalciferol 1000 units tablet Commonly known as:  VITAMIN D Take 1,000 Units by mouth every other day.   clopidogrel 75 MG tablet Commonly known as:  PLAVIX Take 1 tablet (75 mg total) by mouth daily. KEEP OV.   ELIQUIS 5 MG Tabs tablet Generic drug:  apixaban take 1 tablet by mouth twice a day   Fish Oil 1200 MG Caps Take 1,200 mg by mouth daily.   furosemide 40 MG tablet Commonly known as:  LASIX Take 2 tablets (80 mg total) by mouth 2 (two) times daily. NEED OV.   insulin aspart 100 UNIT/ML injection Commonly known as:  novoLOG Inject 0-5 Units into the skin at bedtime. Sliding scale as directed by diabetes coordinator. Follow up with PCP within 1 week   insulin glargine 100 UNIT/ML injection Commonly known as:  LANTUS Inject 0.5 mLs (50 Units total) into the skin every morning.   INVOKANA 100 MG Tabs tablet Generic drug:  canagliflozin Take 100 mg by mouth daily.   lisinopril 10 MG tablet Commonly known as:  PRINIVIL,ZESTRIL take 1 tablet by mouth once daily   metoprolol succinate 50 MG 24 hr tablet Commonly known as:  TOPROL-XL Take 1 tablet (50 mg total) by mouth daily. NEED OV.   nitroGLYCERIN 0.4 MG SL tablet Commonly known as:  NITROSTAT place 1 tablet under the tongue if needed every 5 minutes for chest pain   sertraline 50 MG tablet Commonly known as:  ZOLOFT Take 1 tablet (50  mg total) by mouth daily.   VICTOZA 18 MG/3ML Sopn Generic drug:  liraglutide Inject 1.2 mg into the skin daily.        Prior to Admission medications   Medication Sig Start Date End Date Taking? Authorizing Provider   Allergies:  Allergies  Allergen Reactions  . Coreg [Carvedilol] Nausea And Vomiting    Social History   Social History  . Marital status: Married    Spouse name: N/A  . Number of children: N/A  . Years of education: N/A   Occupational History  . Not on file.   Social History Main Topics  .  Smoking status: Current Some Day Smoker    Types: Cigarettes  . Smokeless tobacco: Never Used     Comment: "1-2 cigarettes per day"  . Alcohol use 0.0 oz/week     Comment: rare  . Drug use: No  . Sexual activity: Not on file   Other Topics Concern  . Not on file   Social History Narrative  . No narrative on file     Family History  Problem Relation Age of Onset  . Heart failure Mother   . Dementia Mother   . Hearing loss Other   . Hypertension Other      ROS:  Please see the history of present illness.     All other systems reviewed and negative.    Physical Exam: Blood pressure 131/81, pulse 86, height  (2.007 m), weight (!) 378 lb (171.5 kg), SpO2 96 %. Well developed and nourished in no acute distress HENT normal Neck supple   Clear Regular rate and rhythm, no murmurs or gallops Abd-soft with active BS No Clubbing cyanosis 1+edema Skin-warm and dry A & Oriented  Grossly normal sensory and motor function       Labs: Cardiac Enzymes No results for input(s): CKTOTAL, CKMB, TROPONINI in the last 72 hours. CBC Lab Results  Component Value Date   WBC 11.2 (H) 03/17/2017   HGB 14.1 03/17/2017   HCT 44.1 03/17/2017   MCV 84.2 03/17/2017   PLT 171 03/17/2017   PROTIME: No results for input(s): LABPROT, INR in the last 72 hours. Chemistry No results for input(s): NA, K, CL, CO2, BUN, CREATININE, CALCIUM, PROT, BILITOT, ALKPHOS, ALT,  AST, GLUCOSE in the last 168 hours.  Invalid input(s): LABALBU Lipids Lab Results  Component Value Date   CHOL 111 06/25/2016   HDL 26 (L) 06/25/2016   LDLCALC 63 06/25/2016   TRIG 108 06/25/2016   BNP Pro B Natriuretic peptide (BNP)  Date/Time Value Ref Range Status  01/11/2008 08:55 PM 345.0 (H)  Final   Thyroid Function Tests: No results for input(s): TSH, T4TOTAL, T3FREE, THYROIDAB in the last 72 hours.  Invalid input(s): FREET3 Miscellaneous No results found for: DDIMER  Radiology/Studies:  No results found.  EKG: sinus rhythm with frequent PVCS AT NINETY four bpm iNTERVALS 17/10/35   Assessment and Plan  Ischemic cardiomyopathy  Ventricular tachycardia  Medtronic defibrillator  The patient's device was interrogated.  The information was reviewed. No changes were made in the programming.      High Risk Medication Surveillance  PVCs  Hypokalemia  Congestive heart failure-chronic-systolic-class III  Abnormal sleeping patterns question sleep apnea    No interval VT.  Tolerating amiodarone.  We will check amiodarone surveillance laboratories.  Without symptoms of ischemia  Last LVEDP remains very elevated.  He is now being followed by renal will wait till his creatinine comes back to make a recommendation regarding his diuretics  We spent more than 50% of our >25 min visit in face to face counseling regarding the above

## 2018-04-20 NOTE — Patient Instructions (Signed)
Medication Instructions:  Your physician recommends that you continue on your current medications as directed. Please refer to the Current Medication list given to you today.  Labwork: You will have labs drawn today: BMP, CBC, LFTs, TSH  Testing/Procedures: None ordered.  Follow-Up: Your physician wants you to follow-up in: One Year with Dr Graciela Husbands. You will receive a reminder letter in the mail two months in advance. If you don't receive a letter, please call our office to schedule the follow-up appointment.  Remote monitoring is used to monitor your ICD from home. This monitoring reduces the number of office visits required to check your device to one time per year. It allows [place] to keep an eye on the functioning of your device to ensure it is working properly. You are scheduled for a device check from home on 06/12/2018. You may send your transmission at any time that day. If you have a wireless device, the transmission will be sent automatically. After your physician reviews your transmission, you will receive a postcard with your next transmission date.     Any Other Special Instructions Will Be Listed Below (If Applicable).     If you need a refill on your cardiac medications before your next appointment, please call your pharmacy.

## 2018-04-21 LAB — CBC WITH DIFFERENTIAL/PLATELET
BASOS ABS: 0 10*3/uL (ref 0.0–0.2)
BASOS: 0 %
EOS (ABSOLUTE): 0.1 10*3/uL (ref 0.0–0.4)
Eos: 1 %
Hematocrit: 43 % (ref 37.5–51.0)
Hemoglobin: 13.3 g/dL (ref 13.0–17.7)
IMMATURE GRANS (ABS): 0.1 10*3/uL (ref 0.0–0.1)
Immature Granulocytes: 1 %
LYMPHS: 27 %
Lymphocytes Absolute: 2.8 10*3/uL (ref 0.7–3.1)
MCH: 24.6 pg — AB (ref 26.6–33.0)
MCHC: 30.9 g/dL — ABNORMAL LOW (ref 31.5–35.7)
MCV: 80 fL (ref 79–97)
MONOS ABS: 0.7 10*3/uL (ref 0.1–0.9)
Monocytes: 7 %
Neutrophils Absolute: 6.8 10*3/uL (ref 1.4–7.0)
Neutrophils: 64 %
PLATELETS: 269 10*3/uL (ref 150–379)
RBC: 5.41 x10E6/uL (ref 4.14–5.80)
RDW: 17.2 % — AB (ref 12.3–15.4)
WBC: 10.5 10*3/uL (ref 3.4–10.8)

## 2018-04-21 LAB — HEPATIC FUNCTION PANEL
ALT: 24 IU/L (ref 0–44)
AST: 26 IU/L (ref 0–40)
Albumin: 4.1 g/dL (ref 3.5–4.8)
Alkaline Phosphatase: 67 IU/L (ref 39–117)
BILIRUBIN, DIRECT: 0.15 mg/dL (ref 0.00–0.40)
Bilirubin Total: 0.4 mg/dL (ref 0.0–1.2)
TOTAL PROTEIN: 7.3 g/dL (ref 6.0–8.5)

## 2018-04-21 LAB — BASIC METABOLIC PANEL
BUN/Creatinine Ratio: 9 — ABNORMAL LOW (ref 10–24)
BUN: 15 mg/dL (ref 8–27)
CALCIUM: 9.4 mg/dL (ref 8.6–10.2)
CHLORIDE: 104 mmol/L (ref 96–106)
CO2: 22 mmol/L (ref 20–29)
Creatinine, Ser: 1.71 mg/dL — ABNORMAL HIGH (ref 0.76–1.27)
GFR calc non Af Amer: 40 mL/min/{1.73_m2} — ABNORMAL LOW (ref >59)
GFR, EST AFRICAN AMERICAN: 46 mL/min/{1.73_m2} — AB (ref >59)
Glucose: 144 mg/dL — ABNORMAL HIGH (ref 65–99)
POTASSIUM: 4.5 mmol/L (ref 3.5–5.2)
SODIUM: 144 mmol/L (ref 134–144)

## 2018-04-21 LAB — TSH: TSH: 3.98 u[IU]/mL (ref 0.450–4.500)

## 2018-04-28 ENCOUNTER — Other Ambulatory Visit: Payer: Self-pay | Admitting: Cardiovascular Disease

## 2018-04-28 NOTE — Telephone Encounter (Signed)
REFILL 

## 2018-05-10 ENCOUNTER — Encounter: Payer: Self-pay | Admitting: Cardiovascular Disease

## 2018-05-10 ENCOUNTER — Ambulatory Visit (INDEPENDENT_AMBULATORY_CARE_PROVIDER_SITE_OTHER): Payer: Medicare Other | Admitting: Cardiovascular Disease

## 2018-05-10 VITALS — BP 132/66 | HR 73 | Ht 79.0 in | Wt >= 6400 oz

## 2018-05-10 DIAGNOSIS — E1122 Type 2 diabetes mellitus with diabetic chronic kidney disease: Secondary | ICD-10-CM

## 2018-05-10 DIAGNOSIS — Z5181 Encounter for therapeutic drug level monitoring: Secondary | ICD-10-CM | POA: Diagnosis not present

## 2018-05-10 DIAGNOSIS — Z95810 Presence of automatic (implantable) cardiac defibrillator: Secondary | ICD-10-CM

## 2018-05-10 DIAGNOSIS — I472 Ventricular tachycardia, unspecified: Secondary | ICD-10-CM

## 2018-05-10 DIAGNOSIS — I5042 Chronic combined systolic (congestive) and diastolic (congestive) heart failure: Secondary | ICD-10-CM

## 2018-05-10 DIAGNOSIS — I480 Paroxysmal atrial fibrillation: Secondary | ICD-10-CM | POA: Diagnosis not present

## 2018-05-10 DIAGNOSIS — I10 Essential (primary) hypertension: Secondary | ICD-10-CM | POA: Diagnosis not present

## 2018-05-10 DIAGNOSIS — E785 Hyperlipidemia, unspecified: Secondary | ICD-10-CM

## 2018-05-10 DIAGNOSIS — Z79899 Other long term (current) drug therapy: Secondary | ICD-10-CM | POA: Diagnosis not present

## 2018-05-10 DIAGNOSIS — N183 Chronic kidney disease, stage 3 unspecified: Secondary | ICD-10-CM

## 2018-05-10 DIAGNOSIS — Z794 Long term (current) use of insulin: Secondary | ICD-10-CM

## 2018-05-10 DIAGNOSIS — I25708 Atherosclerosis of coronary artery bypass graft(s), unspecified, with other forms of angina pectoris: Secondary | ICD-10-CM | POA: Diagnosis not present

## 2018-05-10 NOTE — Progress Notes (Signed)
Patient ID: [name], male   DOB: [birth year], 72 y.o.   MRN: [id number]    Cardiology Office Note    Date:  05/12/2018   ID:  NIMROD WENDT, DOB 1946-09-05, MRN [id number]  PCP:  Devra Dopp, MD  Cardiologist:   Thurmon Fair, MD   Chief Complaint  Patient presents with  . Follow-up  CHF, CAD, VT  History of Present Illness:  [name] is a 72 y.o. male with severe CAD and cardiomyopathy, combined systolic and diastolic HF, s/p ICD, Sustained ventricular tachycardia, PAF, morbid obesity.  He reports generally doing well from his acute heart failure exacerbation.  His OptiVol thoracic impedance does show fluid overload, but this is improving and has almost returned to baseline.  He has very mild ankle swelling.  He denies orthopnea and PND.  The recent gain in fluid may be related to indulgence in salty popcorn.  As always he is very sedentary.  He does not have angina pectoris, palpitations, syncope or defibrillator discharges.  He has not had any bleeding problems.  Defibrillator interrogation shows virtually 100% atrial paced, ventricular sensed rhythm.  There is only 0.1% ventricular pacing.  Estimated generator longevity is 5.2 years.  Activity level is less than half an hour a day.  Both Mr. Bonura and his wife have successfully quit smoking.  Since starting amiodarone in March 2018, he has not had any new episodes of ventricular tachycardia or atrial fibrillation. He is monitored through the heart failure device clinic by Randon Goldsmith.  Marcquis had an anterior STEMI on Feb 01, 2015, due to acute occlusion of the previously stented SVG-diagonal artery and had emergency PCI with a 2.5 x 38 mm Promus DES and a downstream POBA of this vessel. The only other patent conduit is the LIMA to LAD (native vessels, SVG to RCA and SVG to LCX are all chronically occluded).  he developed in-stent restenosis and March 2018 and had recurrent PCI (angioplasty without stent). This  was heralded by recurrent VT with 3 consecutive defibrillator shocks and congestive heart failure exacerbation. He has at least moderately depressed LVEF. At one point LVEF was estimated at 15%, most recent echo probably overestimates his real ejection fraction. He is morbidly obese with a BMI well over 40. He has excellent control of diabetes mellitus with a hemoglobin A1c around 6%. It is very likely that he has obstructive sleep apnea but he declines both diagnostic sleep study and treatment with CPAP. His CHADSVASC score is 68 (age, CAD, CHF, DM, HTN).  Past Medical History:  Diagnosis Date  . CAD (coronary artery disease)    a. CABG 1995 and multiple stents. b. Known graft occlusions - NSTEMI 06/2016 s/p DES to distal SVG-diag.  . Chronic systolic CHF (congestive heart failure) (HCC)   . CKD (chronic kidney disease), stage III (HCC)   . Dietary noncompliance   . DM (diabetes mellitus) (HCC)   . DM type 2 causing CKD stage 3 (HCC)   . Dyslipidemia - low HDL and high triglycerides   . Gout   . Ischemic cardiomyopathy    a. LVEF 10-15% by cath 06/2016.  Marland Kitchen Leukocytosis   . Morbid obesity (HCC)   . Myocardial infarction (HCC)   . NSTEMI (non-ST elevated myocardial infarction) (HCC) 06/26/2016  . PAF (paroxysmal atrial fibrillation) (HCC)   . PONV (postoperative nausea and vomiting)   . Systemic hypertension     Past Surgical History:  Procedure Laterality Date  . CARDIAC CATHETERIZATION N/A  06/28/2016   Procedure: Left Heart Cath and Cors/Grafts Angiography;  Surgeon: Lennette Bihari, MD;  Location: [facility] INVASIVE CV LAB;  Service: Cardiovascular;  Laterality: N/A;  . CARDIAC CATHETERIZATION N/A 06/29/2016   Procedure: Coronary Stent Intervention;  Surgeon: Lennette Bihari, MD;  Location: [facility] INVASIVE CV LAB;  Service: Cardiovascular;  Laterality: N/A;  . CORONARY ANGIOPLASTY WITH STENT PLACEMENT  01/12/2008   multivessel CAD occluded vein grafts to multiple sites including RCA,obtuse marginal  branch & CX.  Successful PTCA and stenting distal graft.  . CORONARY ARTERY BYPASS GRAFT  1995  . CORONARY BALLOON ANGIOPLASTY N/A 03/16/2017   Procedure: Coronary Balloon Angioplasty;  Surgeon: Corky Crafts, MD;  Location: [facility] INVASIVE CV LAB;  Service: Cardiovascular;  Laterality: N/A;  . EP IMPLANTABLE DEVICE N/A 05/09/2015   Procedure: Icd Implant;  Surgeon: Thurmon Fair, MD;  Location: [facility] INVASIVE CV LAB;  Service: Cardiovascular;  Laterality: N/A;  . LEFT HEART CATH AND CORS/GRAFTS ANGIOGRAPHY N/A 03/14/2017   Procedure: Left Heart Cath and Cors/Grafts Angiography;  Surgeon: Corky Crafts, MD;  Location: [facility] INVASIVE CV LAB;  Service: Cardiovascular;  Laterality: N/A;  . LEFT HEART CATHETERIZATION WITH CORONARY ANGIOGRAM N/A 01/29/2015   Procedure: LEFT HEART CATHETERIZATION WITH CORONARY ANGIOGRAM;  Surgeon: Lennette Bihari, MD;  Location: [facility] CATH LAB;  Service: Cardiovascular;  Laterality: N/A;  . THORACIC AORTOGRAM N/A 03/16/2017   Procedure: Thoracic Aortogram;  Surgeon: Corky Crafts, MD;  Location: [facility] INVASIVE CV LAB;  Service: Cardiovascular;  Laterality: N/A;  . TONSILLECTOMY      Outpatient Medications Prior to Visit  Medication Sig Dispense Refill  . acetaminophen (TYLENOL) 325 MG tablet Take 1-2 tablets (325-650 mg total) by mouth every 4 (four) hours as needed for mild pain.    Marland Kitchen allopurinol (ZYLOPRIM) 100 MG tablet Take 100 mg by mouth daily.    Marland Kitchen amiodarone (PACERONE) 200 MG tablet Take 1 tablet (200 mg total) by mouth daily. 90 tablet 3  . atorvastatin (LIPITOR) 80 MG tablet take 1 tablet by mouth once daily 90 tablet 2  . cholecalciferol (VITAMIN D) 1000 UNITS tablet Take 1,000 Units by mouth every other day.     . clopidogrel (PLAVIX) 75 MG tablet TAKE 1 TABLET BY MOUTH DAILY 90 tablet 3  . ELIQUIS 5 MG TABS tablet take 1 tablet by mouth twice a day 60 tablet 1  . furosemide (LASIX) 40 MG tablet Take 2 tablets (80 mg total) by mouth 2 (two) times daily.  NEED OV. 360 tablet 0  . insulin glargine (LANTUS) 100 UNIT/ML injection Inject 0.5 mLs (50 Units total) into the skin every morning. 10 mL 11  . insulin lispro (HUMALOG KWIKPEN) 100 UNIT/ML KiwkPen INJ 32 UNI Simla AT THE BEGINNING OF LARGEST MEAL    . INVOKANA 100 MG TABS Take 100 mg by mouth daily.    Marland Kitchen lisinopril (PRINIVIL,ZESTRIL) 10 MG tablet take 1 tablet by mouth once daily 90 tablet 3  . metoprolol succinate (TOPROL-XL) 50 MG 24 hr tablet Take 1 tablet (50 mg total) by mouth daily. NEED OV. 90 tablet 2  . nitroGLYCERIN (NITROSTAT) 0.4 MG SL tablet place 1 tablet under the tongue if needed every 5 minutes for chest pain  0  . Omega-3 Fatty Acids (FISH OIL) 1200 MG CAPS Take 1,200 mg by mouth daily.     . sertraline (ZOLOFT) 50 MG tablet Take 1 tablet (50 mg total) by mouth daily. 90 tablet 0  . VICTOZA 18 MG/3ML SOPN  Inject 1.2 mg into the skin daily.  0  . insulin aspart (NOVOLOG) 100 UNIT/ML injection Inject 0-5 Units into the skin at bedtime. Sliding scale as directed by diabetes coordinator. Follow up with PCP within 1 week 10 mL 11   No facility-administered medications prior to visit.      Allergies:   Coreg [carvedilol]   Social History   Socioeconomic History  . Marital status: Married    Spouse name: Not on file  . Number of children: Not on file  . Years of education: Not on file  . Highest education level: Not on file  Occupational History  . Not on file  Social Needs  . Financial resource strain: Not on file  . Food insecurity:    Worry: Not on file    Inability: Not on file  . Transportation needs:    Medical: Not on file    Non-medical: Not on file  Tobacco Use  . Smoking status: Former Smoker    Types: Cigarettes    Last attempt to quit: 03/10/2017    Years since quitting: 1.1  . Smokeless tobacco: Never Used  . Tobacco comment: "1-2 cigarettes per day"  Substance and Sexual Activity  . Alcohol use: Yes    Alcohol/week: 0.0 oz    Comment: rare  . Drug  use: No  . Sexual activity: Not on file  Lifestyle  . Physical activity:    Days per week: Not on file    Minutes per session: Not on file  . Stress: Not on file  Relationships  . Social connections:    Talks on phone: Not on file    Gets together: Not on file    Attends religious service: Not on file    Active member of club or organization: Not on file    Attends meetings of clubs or organizations: Not on file    Relationship status: Not on file  Other Topics Concern  . Not on file  Social History Narrative  . Not on file     Family History:  The patient's family history includes Dementia in his mother; Hearing loss in his other; Heart failure in his mother; Hypertension in his other.   ROS:   Please see the history of present illness.    ROS All other systems reviewed and are negative.   PHYSICAL EXAM:   VS:  BP 132/66   Pulse 73   Ht  (2.007 m)   Wt (!) 401 lb (181.9 kg)   SpO2 99%   BMI 45.17 kg/m    General: Alert, oriented x3, no distress, morbid obesity severely limits his exam Head: no evidence of trauma, PERRL, EOMI, no exophtalmos or lid lag, no myxedema, no xanthelasma; normal ears, nose and oropharynx Neck: normal jugular venous pulsations and no hepatojugular reflux; brisk carotid pulses without delay and no carotid bruits Chest: clear to auscultation, no signs of consolidation by percussion or palpation, normal fremitus, symmetrical and full respiratory excursions Cardiovascular: normal position and quality of the apical impulse, regular rhythm, normal first and second heart sounds, no murmurs, rubs or gallops Abdomen: no tenderness or distention, no masses by palpation, no abnormal pulsatility or arterial bruits, normal bowel sounds, no hepatosplenomegaly Extremities: no clubbing, cyanosis or edema; 2+ radial, ulnar and brachial pulses bilaterally; 2+ right femoral, posterior tibial and dorsalis pedis pulses; 2+ left femoral, posterior tibial and  dorsalis pedis pulses; no subclavian or femoral bruits Neurological: grossly nonfocal Psych: Normal mood and affect  Wt Readings from Last 3 Encounters:  05/10/18 (!) 401 lb (181.9 kg)  04/20/18 (!) 403 lb (182.8 kg)  07/13/17 (!) 379 lb (171.9 kg)      Studies/Labs Reviewed:   EKG:  EKG is not ordered today.    Recent Labs: 04/20/2018: ALT 24; BUN 15; Creatinine, Ser 1.71; Hemoglobin 13.3; Platelets 269; Potassium 4.5; Sodium 144; TSH 3.980   Lipid Panel    Component Value Date/Time   CHOL 111 06/25/2016 0333   TRIG 108 06/25/2016 0333   HDL 26 (L) 06/25/2016 0333   CHOLHDL 4.3 06/25/2016 0333   VLDL 22 06/25/2016 0333   LDLCALC 63 06/25/2016 0333    ASSESSMENT:    1. Chronic combined systolic and diastolic CHF (congestive heart failure) (HCC)   2. Paroxysmal atrial fibrillation (HCC)   3. Sustained VT (ventricular tachycardia) (HCC)   4. Encounter for monitoring amiodarone therapy   5. Coronary artery disease involving coronary bypass graft of native heart with other forms of angina pectoris (HCC)   6. Essential hypertension   7. Controlled type 2 diabetes mellitus with stage 3 chronic kidney disease, with long-term current use of insulin (HCC)   8. CKD (chronic kidney disease), stage III (HCC)   9. AICD (automatic cardioverter/defibrillator) present   10. Morbid obesity (HCC)   11. Dyslipidemia - low HDL and high triglycerides      PLAN:  In order of problems listed above:  1. CHF:  appears mildly hypervolemic, although weight suggests he may have gained as much as 25, NYHA class 2 (sedentary), but his physical exam is very difficult due to morbid obesity. Enrolled in the heart failure device remote monitoring clinic with Randon Goldsmith. So far, has stayed within normal range.  Blood pressure is a little higher today and will try to switch him to [facility], after 36-hour lisinopril washout.  Recheck labs in a couple of weeks.  Poorly tolerant to spironolactone.   Reviewed the importance of sodium restriction, did not make changes to his diuretics. 2. PAF:  no recent events detected by his pacemaker. Continue Eliquis. CHADSVasc 5 (age, CHF, CAD, HTN, DM). 3. VT:  No recurrent VT on amiodarone 200 mg daily maintenance dose.  Previous VT was probably a signal of ischemia 4. Amiodarone: Normal TSH and normal liver function tests earlier this month.  5. CAD: no angina, had angioplasty without stent on 03/16/2017.  Hopefully we are already beyond the likely range of restenosis.  Continue clopidogrel therapy indefinitely. Not receiving aspirin since he is also taking full anticoagulation  6. HTN:  BP is well controlled, his blood pressure offered the opportunity to try to introduce Entresto. 7. DM on insulin: well controlled. Last hemoglobin A1c excellent 5.7%. 8. CKD: stable creatinine, baseline probably around 1.6, GFR around 40-45; creatinine was 1.7 on May 2 9. ICD: Normal device function, monitored by Dr. Graciela Husbands 10. Morbid obesity: Refuses sleep evaluation and CPAP, suspected of OSA. He remains very sedentary.  11. HLP: excellent LDL on high-dose statin.  Very low HDL will not improve without weight loss 12. PTSD: Problems with anxiety after his defibrillator discharges.  Is a little hard to read sometimes, but overall he seems to be improved since his last appointment.    Medication Adjustments/Labs and Tests Ordered: Current medicines are reviewed at length with the patient today.  Concerns regarding medicines are outlined above.  Medication changes, Labs and Tests ordered today are listed in the Patient Instructions below. Patient Instructions  Dr Royann Shivers has recommended  making the following medication changes: 1. STOP Lisinopril 2. START Entresto 24-26 mg - take 1 tablet by mouth twice daily  START tomorrow evening Please call on Tuesday and let us know how you tolerate the medication  Remote monitoring is used to monitor your Pacemaker or ICD from  home. This monitoring reduces the number of office visits required to check your device to one time per year. It allows [place] to keep an eye on the functioning of your device to ensure it is working properly. You are scheduled for a device check from home on Monday, June 24th, 2019. You may send your transmission at any time that day. If you have a wireless device, the transmission will be sent automatically. After your physician reviews your transmission, you will receive a postcard with your next transmission date.  To improve our patient care and to more adequately follow your device, [facility] has decided, as a practice, to start following each patient four times a year with your home monitor. This means that you may experience a remote appointment that is close to an in-office appointment with your physician. Your insurance will apply at the same rate as other remote monitoring transmissions.  Dr Royann Shivers recommends that you schedule a follow-up appointment in 6 months with an ICD check. You will receive a reminder letter in the mail two months in advance. If you don't receive a letter, please call our office to schedule the follow-up appointment.  If you need a refill on your cardiac medications before your next appointment, please call your pharmacy.      Signed, Thurmon Fair, MD  05/12/2018 8:14 AM    [facility] [address] [place], [place], [place]  [address] Phone: [phone]; Fax: [phone]

## 2018-05-10 NOTE — Patient Instructions (Addendum)
Dr Royann Shivers has recommended making the following medication changes: 1. STOP Lisinopril 2. START Entresto 24-26 mg - take 1 tablet by mouth twice daily  START tomorrow evening Please call on Tuesday and let us know how you tolerate the medication  Remote monitoring is used to monitor your Pacemaker or ICD from home. This monitoring reduces the number of office visits required to check your device to one time per year. It allows [place] to keep an eye on the functioning of your device to ensure it is working properly. You are scheduled for a device check from home on Monday, June 24th, 2019. You may send your transmission at any time that day. If you have a wireless device, the transmission will be sent automatically. After your physician reviews your transmission, you will receive a postcard with your next transmission date.  To improve our patient care and to more adequately follow your device, [facility] has decided, as a practice, to start following each patient four times a year with your home monitor. This means that you may experience a remote appointment that is close to an in-office appointment with your physician. Your insurance will apply at the same rate as other remote monitoring transmissions.  Dr Royann Shivers recommends that you schedule a follow-up appointment in 6 months with an ICD check. You will receive a reminder letter in the mail two months in advance. If you don't receive a letter, please call our office to schedule the follow-up appointment.  If you need a refill on your cardiac medications before your next appointment, please call your pharmacy.

## 2018-05-16 ENCOUNTER — Telehealth: Payer: Self-pay | Admitting: Cardiovascular Disease

## 2018-05-16 DIAGNOSIS — Z79899 Other long term (current) drug therapy: Secondary | ICD-10-CM

## 2018-05-16 DIAGNOSIS — I255 Ischemic cardiomyopathy: Secondary | ICD-10-CM

## 2018-05-16 DIAGNOSIS — I5042 Chronic combined systolic (congestive) and diastolic (congestive) heart failure: Secondary | ICD-10-CM

## 2018-05-16 MED ORDER — SACUBITRIL-VALSARTAN 24-26 MG PO TABS: 1.0000 | ORAL_TABLET | Freq: Two times a day (BID) | ORAL | 3 refills | Status: DC

## 2018-05-16 NOTE — Telephone Encounter (Signed)
New message   Patient states he is tolerating the Entresto well. Please order medication :[facility] [phone] - [place], [place] - [address]

## 2018-05-16 NOTE — Telephone Encounter (Signed)
Spoke to patient, patient tolerating Entresto well, no issues at this time.   RX sent to preferred pharmacy.    Per OV note: patient needs labs in a couple of weeks, patient unaware of this.    Routed to primary CMA to clarify

## 2018-05-16 NOTE — Addendum Note (Signed)
Addended by: Johney Frame A on: 05/16/2018 12:28 PM   Modules accepted: Orders

## 2018-05-23 NOTE — Telephone Encounter (Signed)
Croitoru, Rachelle HoraMihai, MD  Tyra Gural, Marzella SchleinAngela M, CMA        BMET and Mg only  [facility]    Labs ordered and mailed to patient.

## 2018-05-23 NOTE — Addendum Note (Signed)
Addended by: Neta EhlersRUITT, Mivaan Corbitt M on: 05/23/2018 02:54 PM   Modules accepted: Orders

## 2018-05-24 ENCOUNTER — Other Ambulatory Visit: Payer: Self-pay

## 2018-06-09 LAB — BASIC METABOLIC PANEL
BUN / CREAT RATIO: 9 — AB (ref 10–24)
BUN: 16 mg/dL (ref 8–27)
CO2: 21 mmol/L (ref 20–29)
Calcium: 9.3 mg/dL (ref 8.6–10.2)
Chloride: 103 mmol/L (ref 96–106)
Creatinine, Ser: 1.69 mg/dL — ABNORMAL HIGH (ref 0.76–1.27)
GFR calc non Af Amer: 40 mL/min/{1.73_m2} — ABNORMAL LOW (ref >59)
GFR, EST AFRICAN AMERICAN: 47 mL/min/{1.73_m2} — AB (ref >59)
GLUCOSE: 146 mg/dL — AB (ref 65–99)
Potassium: 4.4 mmol/L (ref 3.5–5.2)
Sodium: 141 mmol/L (ref 134–144)

## 2018-06-09 LAB — MAGNESIUM: MAGNESIUM: 2.3 mg/dL (ref 1.6–2.3)

## 2018-06-12 ENCOUNTER — Ambulatory Visit (INDEPENDENT_AMBULATORY_CARE_PROVIDER_SITE_OTHER): Payer: Medicare Other

## 2018-06-12 DIAGNOSIS — I5042 Chronic combined systolic (congestive) and diastolic (congestive) heart failure: Secondary | ICD-10-CM | POA: Diagnosis not present

## 2018-06-12 DIAGNOSIS — Z95810 Presence of automatic (implantable) cardiac defibrillator: Secondary | ICD-10-CM | POA: Diagnosis not present

## 2018-06-12 NOTE — Progress Notes (Signed)
EPIC Encounter for ICM Monitoring  Patient Name: [name] is a 72 y.o. male Date: 06/12/2018 Primary Care Physican: Helane Rima, MD Primary Cardiologist: Croitoru Electrophysiologist: Faustino Congress Weight:368 lbs      Heart Failure questions reviewed, pt asymptomatic.  Advised the report suggests he has fluid accumulation the majority of days and encouraged to limit salt intake to 2000 mg.  Patient said he has a lot of urination and didn't think he could have a lot of fluid left.   Does not follow low salt diet.     Thoracic impedance continues a pattern of being abnormal suggesting fluid accumulation with minimal days at baseline. Fluid index >threshold  Prescribed dosage: Furosemide 40 mg 2 tablets (80 mg total) twice a day.   Labs: 05/03/2017 Creatinine 1.68, BUN 17, Potassium 4.1, Sodium 148, EGFR 41-47 03/17/2017 Creatinine 1.61, BUN 17, Potassium 3.4, Sodium 136, EGFR 42-49  03/16/2017 Creatinine 1.47, BUN 15, Potassium 3.5, Sodium 139, EGFR 47-54  03/15/2017 Creatinine 1.53, BUN 15, Potassium 3.5, Sodium 137, EGFR 45-52  03/14/2017 Creatinine 1.58, BUN 17, Potassium 3.7, Sodium 138, EGFR 43-50  03/13/2017 Creatinine 1.67, BUN 19, Potassium 4.0, Sodium 137, EGFR 40-47  03/12/2017 Creatinine 1.55, BUN 19, Potassium 4.5, Sodium 137, EGFR 44-51  03/11/2017 Creatinine 1.59, BUN 25, Potassium 3.9, Sodium 136, EGFR 43-49  03/10/2017 Creatinine 1.61, BUN 17, Potassium 4.1, Sodium 137, EGFR 42-49  12/29/2017Creatinine 1.62, BUN 17, Potassium 4.4, Sodium 140  Recommendations: Reinforced fluid restriction and sodium restriction.  Encouraged to call for fluid symptoms.  Follow-up plan: ICM clinic phone appointment on 07/03/2018 to recheck fluid levels.   Recall appt 04/20/2019 with Dr. Caryl Comes and 05/10/2019 with Dr Sallyanne Kuster.  Copy of ICM check sent to Dr. Sallyanne Kuster and Dr. Caryl Comes for review and will call back if any recommendations.   3 month ICM trend: 06/12/2018    1 Year ICM  trend:       Rosalene Billings, RN 06/12/2018 1:53 PM

## 2018-07-03 ENCOUNTER — Ambulatory Visit (INDEPENDENT_AMBULATORY_CARE_PROVIDER_SITE_OTHER): Payer: Medicare Other

## 2018-07-03 DIAGNOSIS — I5042 Chronic combined systolic (congestive) and diastolic (congestive) heart failure: Secondary | ICD-10-CM

## 2018-07-03 DIAGNOSIS — Z95810 Presence of automatic (implantable) cardiac defibrillator: Secondary | ICD-10-CM

## 2018-07-03 NOTE — Progress Notes (Signed)
EPIC Encounter for ICM Monitoring  Patient Name: [name] is a 72 y.o. male Date: 07/03/2018 Primary Care Physican: Helane Rima, MD Primary Cardiologist: Croitoru Electrophysiologist: Faustino Congress Weight:368 lbs       Heart Failure questions reviewed, pt asymptomatic.  Does not follow low salt diet   Thoracic impedance has improved since last ICM transmission on 6/24 but remains abnormal suggesting fluid accumulation starting 05/22/2018 with exception of ~3 days close to baseline.  Fluid index >threshold  Prescribed dosage: Furosemide 40 mg 2 tablets (80 mg total) twice a day.   Labs: 05/03/2017 Creatinine 1.68, BUN 17, Potassium 4.1, Sodium 148, EGFR 41-47 03/17/2017 Creatinine 1.61, BUN 17, Potassium 3.4, Sodium 136, EGFR 42-49  03/16/2017 Creatinine 1.47, BUN 15, Potassium 3.5, Sodium 139, EGFR 47-54  03/15/2017 Creatinine 1.53, BUN 15, Potassium 3.5, Sodium 137, EGFR 45-52  03/14/2017 Creatinine 1.58, BUN 17, Potassium 3.7, Sodium 138, EGFR 43-50  03/13/2017 Creatinine 1.67, BUN 19, Potassium 4.0, Sodium 137, EGFR 40-47  03/12/2017 Creatinine 1.55, BUN 19, Potassium 4.5, Sodium 137, EGFR 44-51  03/11/2017 Creatinine 1.59, BUN 25, Potassium 3.9, Sodium 136, EGFR 43-49  03/10/2017 Creatinine 1.61, BUN 17, Potassium 4.1, Sodium 137, EGFR 42-49   Recommendations: No changes.  Encouraged to call for fluid symptoms.  Follow-up plan: ICM clinic phone appointment on 07/24/2018.     Copy of ICM check sent to Dr. Caryl Comes and Dr Sallyanne Kuster.   3 month ICM trend: 07/03/2018    1 Year ICM trend:       Rosalene Billings, RN 07/03/2018 3:21 PM

## 2018-07-10 ENCOUNTER — Other Ambulatory Visit: Payer: Self-pay

## 2018-07-10 MED ORDER — AMIODARONE HCL 200 MG PO TABS: 200.0000 mg | ORAL_TABLET | Freq: Every day | ORAL | 3 refills | Status: DC

## 2018-07-21 ENCOUNTER — Emergency Department (HOSPITAL_COMMUNITY): Payer: Medicare Other

## 2018-07-21 ENCOUNTER — Other Ambulatory Visit: Payer: Self-pay

## 2018-07-21 ENCOUNTER — Encounter (HOSPITAL_COMMUNITY)
Admission: EM | Disposition: A | Discharge status: Home Health | Payer: Self-pay | Source: Home / Self Care | Attending: Cardiology

## 2018-07-21 ENCOUNTER — Inpatient Hospital Stay (HOSPITAL_COMMUNITY)
Admission: EM | Admit: 2018-07-21 | Discharge: 2018-07-26 | DRG: 246 | Disposition: A | Discharge status: Home Health | Payer: Medicare Other | Attending: Cardiology | Admitting: Cardiology

## 2018-07-21 ENCOUNTER — Encounter (HOSPITAL_COMMUNITY): Payer: Self-pay | Admitting: *Deleted

## 2018-07-21 DIAGNOSIS — Z7902 Long term (current) use of antithrombotics/antiplatelets: Secondary | ICD-10-CM | POA: Diagnosis not present

## 2018-07-21 DIAGNOSIS — Z87891 Personal history of nicotine dependence: Secondary | ICD-10-CM

## 2018-07-21 DIAGNOSIS — Z951 Presence of aortocoronary bypass graft: Secondary | ICD-10-CM

## 2018-07-21 DIAGNOSIS — I252 Old myocardial infarction: Secondary | ICD-10-CM | POA: Diagnosis not present

## 2018-07-21 DIAGNOSIS — E785 Hyperlipidemia, unspecified: Secondary | ICD-10-CM | POA: Diagnosis present

## 2018-07-21 DIAGNOSIS — R079 Chest pain, unspecified: Secondary | ICD-10-CM | POA: Diagnosis present

## 2018-07-21 DIAGNOSIS — I255 Ischemic cardiomyopathy: Secondary | ICD-10-CM | POA: Diagnosis present

## 2018-07-21 DIAGNOSIS — M109 Gout, unspecified: Secondary | ICD-10-CM | POA: Diagnosis present

## 2018-07-21 DIAGNOSIS — Z794 Long term (current) use of insulin: Secondary | ICD-10-CM | POA: Diagnosis not present

## 2018-07-21 DIAGNOSIS — I214 Non-ST elevation (NSTEMI) myocardial infarction: Principal | ICD-10-CM

## 2018-07-21 DIAGNOSIS — I25810 Atherosclerosis of coronary artery bypass graft(s) without angina pectoris: Secondary | ICD-10-CM | POA: Diagnosis not present

## 2018-07-21 DIAGNOSIS — N183 Chronic kidney disease, stage 3 unspecified: Secondary | ICD-10-CM | POA: Diagnosis present

## 2018-07-21 DIAGNOSIS — E1122 Type 2 diabetes mellitus with diabetic chronic kidney disease: Secondary | ICD-10-CM | POA: Diagnosis present

## 2018-07-21 DIAGNOSIS — I200 Unstable angina: Secondary | ICD-10-CM | POA: Diagnosis present

## 2018-07-21 DIAGNOSIS — I130 Hypertensive heart and chronic kidney disease with heart failure and stage 1 through stage 4 chronic kidney disease, or unspecified chronic kidney disease: Secondary | ICD-10-CM | POA: Diagnosis present

## 2018-07-21 DIAGNOSIS — Z6841 Body Mass Index (BMI) 40.0 and over, adult: Secondary | ICD-10-CM

## 2018-07-21 DIAGNOSIS — Z888 Allergy status to other drugs, medicaments and biological substances status: Secondary | ICD-10-CM | POA: Diagnosis not present

## 2018-07-21 DIAGNOSIS — I480 Paroxysmal atrial fibrillation: Secondary | ICD-10-CM | POA: Diagnosis present

## 2018-07-21 DIAGNOSIS — R072 Precordial pain: Secondary | ICD-10-CM | POA: Diagnosis present

## 2018-07-21 DIAGNOSIS — Y831 Surgical operation with implant of artificial internal device as the cause of abnormal reaction of the patient, or of later complication, without mention of misadventure at the time of the procedure: Secondary | ICD-10-CM | POA: Diagnosis present

## 2018-07-21 DIAGNOSIS — Z7901 Long term (current) use of anticoagulants: Secondary | ICD-10-CM | POA: Diagnosis not present

## 2018-07-21 DIAGNOSIS — I5043 Acute on chronic combined systolic (congestive) and diastolic (congestive) heart failure: Secondary | ICD-10-CM | POA: Diagnosis present

## 2018-07-21 DIAGNOSIS — Z8249 Family history of ischemic heart disease and other diseases of the circulatory system: Secondary | ICD-10-CM

## 2018-07-21 DIAGNOSIS — I509 Heart failure, unspecified: Secondary | ICD-10-CM

## 2018-07-21 DIAGNOSIS — Z955 Presence of coronary angioplasty implant and graft: Secondary | ICD-10-CM

## 2018-07-21 DIAGNOSIS — I25110 Atherosclerotic heart disease of native coronary artery with unstable angina pectoris: Secondary | ICD-10-CM | POA: Diagnosis present

## 2018-07-21 DIAGNOSIS — T82855A Stenosis of coronary artery stent, initial encounter: Secondary | ICD-10-CM | POA: Diagnosis present

## 2018-07-21 DIAGNOSIS — Z95810 Presence of automatic (implantable) cardiac defibrillator: Secondary | ICD-10-CM | POA: Diagnosis not present

## 2018-07-21 HISTORY — DX: Unstable angina: I20.0

## 2018-07-21 LAB — I-STAT CHEM 8, ED
BUN: 19 mg/dL (ref 8–23)
Calcium, Ion: 1.06 mmol/L — ABNORMAL LOW (ref 1.15–1.40)
Chloride: 104 mmol/L (ref 98–111)
Creatinine, Ser: 1.6 mg/dL — ABNORMAL HIGH (ref 0.61–1.24)
Glucose, Bld: 143 mg/dL — ABNORMAL HIGH (ref 70–99)
HEMATOCRIT: 42 % (ref 39.0–52.0)
Hemoglobin: 14.3 g/dL (ref 13.0–17.0)
Potassium: 5.1 mmol/L (ref 3.5–5.1)
SODIUM: 140 mmol/L (ref 135–145)
TCO2: 28 mmol/L (ref 22–32)

## 2018-07-21 LAB — BASIC METABOLIC PANEL
Anion gap: 12 (ref 5–15)
BUN: 13 mg/dL (ref 8–23)
CHLORIDE: 105 mmol/L (ref 98–111)
CO2: 24 mmol/L (ref 22–32)
Calcium: 8.9 mg/dL (ref 8.9–10.3)
Creatinine, Ser: 1.61 mg/dL — ABNORMAL HIGH (ref 0.61–1.24)
GFR calc non Af Amer: 42 mL/min — ABNORMAL LOW (ref >60)
GFR, EST AFRICAN AMERICAN: 48 mL/min — AB (ref >60)
Glucose, Bld: 146 mg/dL — ABNORMAL HIGH (ref 70–99)
POTASSIUM: 4.2 mmol/L (ref 3.5–5.1)
Sodium: 141 mmol/L (ref 135–145)

## 2018-07-21 LAB — GLUCOSE, CAPILLARY: Glucose-Capillary: 154 mg/dL — ABNORMAL HIGH (ref 70–99)

## 2018-07-21 LAB — CBC
HCT: 39.7 % (ref 39.0–52.0)
HEMATOCRIT: 43.3 % (ref 39.0–52.0)
Hemoglobin: 11.7 g/dL — ABNORMAL LOW (ref 13.0–17.0)
Hemoglobin: 11.1 g/dL — ABNORMAL LOW (ref 13.0–17.0)
MCH: 20.8 pg — ABNORMAL LOW (ref 26.0–34.0)
MCH: 21.2 pg — ABNORMAL LOW (ref 26.0–34.0)
MCHC: 27 g/dL — ABNORMAL LOW (ref 30.0–36.0)
MCHC: 28 g/dL — ABNORMAL LOW (ref 30.0–36.0)
MCV: 77 fL — AB (ref 78.0–100.0)
MCV: 75.9 fL — ABNORMAL LOW (ref 78.0–100.0)
PLATELETS: 316 10*3/uL (ref 150–400)
PLATELETS: 231 10*3/uL (ref 150–400)
RBC: 5.62 MIL/uL (ref 4.22–5.81)
RBC: 5.23 MIL/uL (ref 4.22–5.81)
RDW: 19.6 % — ABNORMAL HIGH (ref 11.5–15.5)
RDW: 18.8 % — ABNORMAL HIGH (ref 11.5–15.5)
WBC: 10.7 10*3/uL — AB (ref 4.0–10.5)
WBC: 10.6 10*3/uL — ABNORMAL HIGH (ref 4.0–10.5)

## 2018-07-21 LAB — I-STAT ARTERIAL BLOOD GAS, ED
Acid-base deficit: 1 mmol/L (ref 0.0–2.0)
Bicarbonate: 24.7 mmol/L (ref 20.0–28.0)
O2 Saturation: 98 %
PH ART: 7.377 (ref 7.350–7.450)
TCO2: 26 mmol/L (ref 22–32)
pCO2 arterial: 41.8 mmHg (ref 32.0–48.0)
pO2, Arterial: 101 mmHg (ref 83.0–108.0)

## 2018-07-21 LAB — I-STAT TROPONIN, ED: Troponin i, poc: 0.01 ng/mL (ref 0.00–0.08)

## 2018-07-21 LAB — CREATININE, SERUM
Creatinine, Ser: 1.56 mg/dL — ABNORMAL HIGH (ref 0.61–1.24)
GFR calc Af Amer: 50 mL/min — ABNORMAL LOW (ref >60)
GFR, EST NON AFRICAN AMERICAN: 43 mL/min — AB (ref >60)

## 2018-07-21 LAB — APTT: APTT: 51 s — AB (ref 24–36)

## 2018-07-21 LAB — CBG MONITORING, ED: GLUCOSE-CAPILLARY: 125 mg/dL — AB (ref 70–99)

## 2018-07-21 LAB — BRAIN NATRIURETIC PEPTIDE: B Natriuretic Peptide: 463.8 pg/mL — ABNORMAL HIGH (ref 0.0–100.0)

## 2018-07-21 LAB — TROPONIN I: TROPONIN I: 0.6 ng/mL — AB (ref <0.03)

## 2018-07-21 LAB — HEPARIN LEVEL (UNFRACTIONATED): HEPARIN UNFRACTIONATED: 0.99 [IU]/mL — AB (ref 0.30–0.70)

## 2018-07-21 LAB — MRSA PCR SCREENING: MRSA by PCR: NEGATIVE

## 2018-07-21 SURGERY — LEFT HEART CATH AND CORONARY ANGIOGRAPHY
Anesthesia: LOCAL

## 2018-07-21 MED ORDER — INSULIN GLARGINE 100 UNIT/ML ~~LOC~~ SOLN
50.0000 [IU] | Freq: Every morning | SUBCUTANEOUS | Status: DC
  Administered 2018-07-22 – 2018-07-23 (×2): 50 [IU] via SUBCUTANEOUS
  Filled 2018-07-21 (×6): qty 0.5

## 2018-07-21 MED ORDER — OMEGA-3-ACID ETHYL ESTERS 1 G PO CAPS
1000.0000 mg | ORAL_CAPSULE | Freq: Every day | ORAL | Status: DC
  Administered 2018-07-21 – 2018-07-26 (×6): 1000 mg via ORAL
  Filled 2018-07-21 (×6): qty 1

## 2018-07-21 MED ORDER — METOPROLOL SUCCINATE ER 50 MG PO TB24
50.0000 mg | ORAL_TABLET | Freq: Every day | ORAL | Status: DC
  Administered 2018-07-21 – 2018-07-26 (×6): 50 mg via ORAL
  Filled 2018-07-21 (×6): qty 1

## 2018-07-21 MED ORDER — HEPARIN (PORCINE) IN NACL 100-0.45 UNIT/ML-% IJ SOLN
1450.0000 [IU]/h | INTRAMUSCULAR | Status: DC
  Administered 2018-07-21: 1100 [IU]/h via INTRAVENOUS
  Administered 2018-07-23 (×2): 1450 [IU]/h via INTRAVENOUS
  Filled 2018-07-21 (×4): qty 250

## 2018-07-21 MED ORDER — INSULIN LISPRO 100 UNIT/ML (KWIKPEN): 32.0000 [IU] | PEN_INJECTOR | Freq: Every day | SUBCUTANEOUS | Status: DC

## 2018-07-21 MED ORDER — CANAGLIFLOZIN 100 MG PO TABS
100.0000 mg | ORAL_TABLET | Freq: Every day | ORAL | Status: DC
  Administered 2018-07-21 – 2018-07-26 (×5): 100 mg via ORAL
  Filled 2018-07-21 (×6): qty 1

## 2018-07-21 MED ORDER — FUROSEMIDE 10 MG/ML IJ SOLN
INTRAMUSCULAR | Status: AC
  Filled 2018-07-21: qty 4

## 2018-07-21 MED ORDER — IOPAMIDOL (ISOVUE-370) INJECTION 76%
INTRAVENOUS | Status: AC
  Filled 2018-07-21: qty 125

## 2018-07-21 MED ORDER — CLOPIDOGREL BISULFATE 75 MG PO TABS
75.0000 mg | ORAL_TABLET | Freq: Every day | ORAL | Status: DC
  Administered 2018-07-21 – 2018-07-24 (×4): 75 mg via ORAL
  Filled 2018-07-21 (×4): qty 1

## 2018-07-21 MED ORDER — ONDANSETRON HCL 4 MG/2ML IJ SOLN: 4.0000 mg | Freq: Four times a day (QID) | INTRAMUSCULAR | Status: DC | PRN

## 2018-07-21 MED ORDER — MORPHINE SULFATE (PF) 4 MG/ML IV SOLN
4.0000 mg | Freq: Once | INTRAVENOUS | Status: AC
  Administered 2018-07-21: 4 mg via INTRAVENOUS
  Filled 2018-07-21: qty 1

## 2018-07-21 MED ORDER — SACUBITRIL-VALSARTAN 24-26 MG PO TABS
1.0000 | ORAL_TABLET | Freq: Two times a day (BID) | ORAL | Status: DC
Start: 2018-07-21 — End: 2018-07-26
  Administered 2018-07-21 – 2018-07-25 (×8): 1 via ORAL
  Filled 2018-07-21 (×10): qty 1

## 2018-07-21 MED ORDER — ASPIRIN 81 MG PO CHEW
324.0000 mg | CHEWABLE_TABLET | Freq: Once | ORAL | Status: AC
  Administered 2018-07-21: 324 mg via ORAL
  Filled 2018-07-21: qty 4

## 2018-07-21 MED ORDER — ACETAMINOPHEN 325 MG PO TABS
650.0000 mg | ORAL_TABLET | ORAL | Status: DC | PRN
  Administered 2018-07-21 – 2018-07-24 (×6): 650 mg via ORAL
  Filled 2018-07-21 (×6): qty 2

## 2018-07-21 MED ORDER — FUROSEMIDE 10 MG/ML IJ SOLN
40.0000 mg | Freq: Once | INTRAMUSCULAR | Status: AC
  Administered 2018-07-21: 40 mg via INTRAVENOUS

## 2018-07-21 MED ORDER — INSULIN ASPART 100 UNIT/ML ~~LOC~~ SOLN
32.0000 [IU] | Freq: Every day | SUBCUTANEOUS | Status: DC
  Administered 2018-07-24 – 2018-07-25 (×2): 32 [IU] via SUBCUTANEOUS

## 2018-07-21 MED ORDER — NITROGLYCERIN 0.4 MG SL SUBL: 0.4000 mg | SUBLINGUAL_TABLET | SUBLINGUAL | Status: DC | PRN

## 2018-07-21 MED ORDER — SERTRALINE HCL 50 MG PO TABS
50.0000 mg | ORAL_TABLET | Freq: Every day | ORAL | Status: DC
  Administered 2018-07-21 – 2018-07-23 (×3): 50 mg via ORAL
  Filled 2018-07-21 (×3): qty 1

## 2018-07-21 MED ORDER — HEPARIN (PORCINE) IN NACL 1000-0.9 UT/500ML-% IV SOLN
INTRAVENOUS | Status: AC
  Filled 2018-07-21: qty 1000

## 2018-07-21 MED ORDER — ASPIRIN EC 81 MG PO TBEC
81.0000 mg | DELAYED_RELEASE_TABLET | Freq: Every day | ORAL | Status: DC
  Administered 2018-07-22 – 2018-07-23 (×2): 81 mg via ORAL
  Filled 2018-07-21 (×2): qty 1

## 2018-07-21 MED ORDER — ATORVASTATIN CALCIUM 80 MG PO TABS
80.0000 mg | ORAL_TABLET | Freq: Every day | ORAL | Status: DC
  Administered 2018-07-21 – 2018-07-26 (×5): 80 mg via ORAL
  Filled 2018-07-21 (×4): qty 1
  Filled 2018-07-21: qty 4
  Filled 2018-07-21: qty 1

## 2018-07-21 MED ORDER — AMIODARONE HCL 200 MG PO TABS
200.0000 mg | ORAL_TABLET | Freq: Every day | ORAL | Status: DC
  Administered 2018-07-21 – 2018-07-26 (×6): 200 mg via ORAL
  Filled 2018-07-21 (×6): qty 1

## 2018-07-21 MED ORDER — NITROGLYCERIN IN D5W 200-5 MCG/ML-% IV SOLN
0.0000 ug/min | INTRAVENOUS | Status: DC
  Administered 2018-07-21: 45 ug/min via INTRAVENOUS
  Administered 2018-07-21: 35 ug/min via INTRAVENOUS
  Administered 2018-07-21: 5 ug/min via INTRAVENOUS
  Administered 2018-07-22 – 2018-07-23 (×4): 50 ug/min via INTRAVENOUS
  Administered 2018-07-24 – 2018-07-25 (×2): 30 ug/min via INTRAVENOUS
  Administered 2018-07-26: 20 ug/min via INTRAVENOUS
  Filled 2018-07-21 (×6): qty 250

## 2018-07-21 MED ORDER — LIDOCAINE HCL (PF) 1 % IJ SOLN
INTRAMUSCULAR | Status: AC
  Filled 2018-07-21: qty 30

## 2018-07-21 MED ORDER — MORPHINE SULFATE (PF) 4 MG/ML IV SOLN: 4.0000 mg | Freq: Once | INTRAVENOUS | Status: DC

## 2018-07-21 MED ORDER — VITAMIN D 1000 UNITS PO TABS
1000.0000 [IU] | ORAL_TABLET | ORAL | Status: DC
  Administered 2018-07-21 – 2018-07-26 (×4): 1000 [IU] via ORAL
  Filled 2018-07-21 (×4): qty 1

## 2018-07-21 MED ORDER — ALLOPURINOL 100 MG PO TABS
100.0000 mg | ORAL_TABLET | Freq: Every day | ORAL | Status: DC
  Administered 2018-07-21 – 2018-07-26 (×6): 100 mg via ORAL
  Filled 2018-07-21 (×6): qty 1

## 2018-07-21 MED ORDER — FUROSEMIDE 10 MG/ML IJ SOLN
INTRAMUSCULAR | Status: AC
  Filled 2018-07-21: qty 2

## 2018-07-21 MED ORDER — LIRAGLUTIDE 18 MG/3ML ~~LOC~~ SOPN: 1.2000 mg | PEN_INJECTOR | Freq: Every day | SUBCUTANEOUS | Status: DC

## 2018-07-21 NOTE — Progress Notes (Signed)
ANTICOAGULATION CONSULT NOTE   Pharmacy Consult for Heparin Indication: chest pain/ACS and atrial fibrillation  Allergies  Allergen Reactions  . Coreg [Carvedilol] Nausea And Vomiting    Patient Measurements: Height: 6\' 7"  (200.7 cm) Weight: (!) 416 lb 12.8 oz (189.1 kg) IBW/kg (Calculated) : 93.7  Vital Signs: Temp: 97.6 F (36.4 C) (08/02 2128) Temp Source: Oral (08/02 2128) BP: 142/91 (08/02 2128) Pulse Rate: 62 (08/02 2128)  Labs: Recent Labs    07/21/18 1328 07/21/18 1343 07/21/18 1531 07/21/18 2016 07/21/18 2217  HGB 11.7* 14.3  --  11.1*  --   HCT 43.3 42.0  --  39.7  --   PLT 316  --   --  231  --   APTT  --   --   --   --  51*  HEPARINUNFRC  --   --   --   --  0.99*  CREATININE 1.61* 1.60*  --  1.56*  --   TROPONINI  --   --  <0.03 0.60*  --     Estimated Creatinine Clearance: 82.2 mL/min (A) (by C-G formula based on SCr of 1.56 mg/dL (H)).   Medical History: Past Medical History:  Diagnosis Date  . CAD (coronary artery disease)    a. CABG 1995 and multiple stents. b. Known graft occlusions - NSTEMI 06/2016 s/p DES to distal SVG-diag.  . Chronic systolic CHF (congestive heart failure) (HCC)   . CKD (chronic kidney disease), stage III (HCC)   . Dietary noncompliance   . DM (diabetes mellitus) (HCC)   . DM type 2 causing CKD stage 3 (HCC)   . Dyslipidemia - low HDL and high triglycerides   . Gout   . Ischemic cardiomyopathy    a. LVEF 10-15% by cath 06/2016.  Marland Kitchen. Leukocytosis   . Morbid obesity (HCC)   . Myocardial infarction (HCC)   . NSTEMI (non-ST elevated myocardial infarction) (HCC) 06/26/2016  . PAF (paroxysmal atrial fibrillation) (HCC)   . PONV (postoperative nausea and vomiting)   . Systemic hypertension   . Unstable angina (HCC) 07/21/2018    Medications:  Infusions:  . heparin 1,100 Units/hr (07/21/18 2054)  . nitroGLYCERIN 45 mcg/min (07/21/18 2208)    Assessment: 72 yom presented to the ED as a Code STEMI. To start IV heparin. Pt is  on apixaban PTA for history of afib. He reports his last dose was last night at ~9pm. Labs are pending but CBC has been normal in the past.  8/2 PM update: aPTT is low tonight, using aPTT to guide dosing for now given apixaban influence on heparin levels (which is elevated at 0.99).    Goal of Therapy:  Heparin level 0.3-0.7 units/ml aPTT 66-103 seconds Monitor platelets by anticoagulation protocol: Yes   Plan:  Inc heparin drip to 1250 units/hr 0800 aPTT/HL Daily CBC/aPTT/HL Monitor for bleeding  Abran DukeJames Shela Esses, PharmD, BCPS Clinical Pharmacist Phone: [phone]

## 2018-07-21 NOTE — ED Notes (Signed)
Cardiology paged for bed request.  

## 2018-07-21 NOTE — Progress Notes (Signed)
ANTICOAGULATION CONSULT NOTE - Initial Consult  Pharmacy Consult for heparin Indication: chest pain/ACS and atrial fibrillation  Allergies  Allergen Reactions  . Coreg [Carvedilol] Nausea And Vomiting    Patient Measurements:   Heparin Dosing Weight:   Vital Signs: Temp: 98 F (36.7 C) (08/02 1319) Temp Source: Oral (08/02 1319) BP: 157/82 (08/02 1319) Pulse Rate: 74 (08/02 1319)  Labs: No results for input(s): HGB, HCT, PLT, APTT, LABPROT, INR, HEPARINUNFRC, HEPRLOWMOCWT, CREATININE, CKTOTAL, CKMB, TROPONINI in the last 72 hours.  CrCl cannot be calculated (Patient's most recent lab result is older than the maximum 21 days allowed.).   Medical History: Past Medical History:  Diagnosis Date  . CAD (coronary artery disease)    a. CABG 1995 and multiple stents. b. Known graft occlusions - NSTEMI 06/2016 s/p DES to distal SVG-diag.  . Chronic systolic CHF (congestive heart failure) (HCC)   . CKD (chronic kidney disease), stage III (HCC)   . Dietary noncompliance   . DM (diabetes mellitus) (HCC)   . DM type 2 causing CKD stage 3 (HCC)   . Dyslipidemia - low HDL and high triglycerides   . Gout   . Ischemic cardiomyopathy    a. LVEF 10-15% by cath 06/2016.  Marland Kitchen. Leukocytosis   . Morbid obesity (HCC)   . Myocardial infarction (HCC)   . NSTEMI (non-ST elevated myocardial infarction) (HCC) 06/26/2016  . PAF (paroxysmal atrial fibrillation) (HCC)   . PONV (postoperative nausea and vomiting)   . Systemic hypertension     Medications:  Infusions:  . heparin 1,100 Units/hr (07/21/18 1344)  . nitroGLYCERIN      Assessment: 72 yom presented to the ED as a Code STEMI. To start IV heparin. Pt is on apixaban PTA for history of afib. He reports his last dose was last night at ~9pm. Labs are pending but CBC has been normal in the past.   Goal of Therapy:  Heparin level 0.3-0.7 units/ml aPTT 66-103 seconds Monitor platelets by anticoagulation protocol: Yes   Plan:  Heparin gtt  1100 units/hr - has been therapeutic on this in the past Check an 8 hr heparin level and aPTT - will likely need to use aPTT for monitoring d/t apixaban impact on heparin levels Daily heparin level, aPTT and CBC F/u cardiology plans for cath  Olanda Boughner, Drake Leachachel Lynn 07/21/2018,1:44 PM

## 2018-07-21 NOTE — ED Notes (Signed)
[facility] notified  Of Code Stemi @ [phone]. Spoke w/ Huntley DecSara.

## 2018-07-21 NOTE — ED Notes (Signed)
Other 2mg  morphine given.

## 2018-07-21 NOTE — ED Provider Notes (Signed)
[facility] EMERGENCY DEPARTMENT Provider Note   CSN: [id number] Arrival date & time: 07/21/18  1311     History   Chief Complaint Chief Complaint  Patient presents with  . Chest Pain    HPI [name] is a 72 y.o. male.  The history is provided by the patient. No language interpreter was used.  Chest Pain     [name] is a 72 y.o. male who presents to the Emergency Department complaining of chest pain.  He reports sudden onset left sided chest pain with SOB that began 45 minutes prior to ED arrival.  Sxs occurred at rest.  He took two nitro with mild improvement in sxs.  No fever, cough, abdominal pain, N/V.  No prior similar sxs.  Sxs are severe in nature.    Past Medical History:  Diagnosis Date  . CAD (coronary artery disease)    a. CABG 1995 and multiple stents. b. Known graft occlusions - NSTEMI 06/2016 s/p DES to distal SVG-diag.  . Chronic systolic CHF (congestive heart failure) (HCC)   . CKD (chronic kidney disease), stage III (HCC)   . Dietary noncompliance   . DM (diabetes mellitus) (HCC)   . DM type 2 causing CKD stage 3 (HCC)   . Dyslipidemia - low HDL and high triglycerides   . Gout   . Ischemic cardiomyopathy    a. LVEF 10-15% by cath 06/2016.  Marland Kitchen Leukocytosis   . Morbid obesity (HCC)   . Myocardial infarction (HCC)   . NSTEMI (non-ST elevated myocardial infarction) (HCC) 06/26/2016  . PAF (paroxysmal atrial fibrillation) (HCC)   . PONV (postoperative nausea and vomiting)   . Systemic hypertension   . Unstable angina (HCC) 07/21/2018    Patient Active Problem List   Diagnosis Date Noted  . Acute on chronic combined systolic (congestive) and diastolic (congestive) heart failure (HCC) 07/21/2018  . Unstable angina (HCC) 07/21/2018  . Hx of CABG   . AICD discharge   . Ventricular fibrillation (HCC) 03/10/2017  . Sustained VT (ventricular tachycardia) (HCC) 08/25/2016  . Dietary noncompliance 06/30/2016  . Ischemic  cardiomyopathy 06/30/2016  . CKD (chronic kidney disease), stage III (HCC)   . NSTEMI (non-ST elevated myocardial infarction) (HCC) 06/26/2016  . Essential hypertension 12/03/2015  . Paroxysmal atrial fibrillation (HCC) 12/03/2015  . At risk for sudden cardiac death 2015/05/13  . AICD (automatic cardioverter/defibrillator) present 13-May-2015  . STEMI (ST elevation myocardial infarction) (HCC) 01/29/2015  . ST elevation myocardial infarction (STEMI) involving other coronary artery of anterior wall (HCC)   . Chronic kidney disease   . CAD s/p CABG 1995, graft dependent (LIMA to LAD, SVG to diagonal stented, other grafts occluded) 01/25/2014  . Cardiomyopathy, ischemic 01/25/2014  . Chronic combined systolic and diastolic CHF (congestive heart failure) (HCC) 01/25/2014  . Morbid obesity (HCC) 01/25/2014  . Controlled type 2 diabetes mellitus with stage 3 chronic kidney disease (HCC) 01/25/2014  . Gout 01/25/2014  . Dyslipidemia - low HDL and high triglycerides 01/25/2014    Past Surgical History:  Procedure Laterality Date  . CARDIAC CATHETERIZATION N/A 06/28/2016   Procedure: Left Heart Cath and Cors/Grafts Angiography;  Surgeon: Lennette Bihari, MD;  Location: [facility] INVASIVE CV LAB;  Service: Cardiovascular;  Laterality: N/A;  . CARDIAC CATHETERIZATION N/A 06/29/2016   Procedure: Coronary Stent Intervention;  Surgeon: Lennette Bihari, MD;  Location: [facility] INVASIVE CV LAB;  Service: Cardiovascular;  Laterality: N/A;  . CORONARY ANGIOPLASTY WITH STENT PLACEMENT  01/12/2008  multivessel CAD occluded vein grafts to multiple sites including RCA,obtuse marginal branch & CX.  Successful PTCA and stenting distal graft.  . CORONARY ARTERY BYPASS GRAFT  1995  . CORONARY BALLOON ANGIOPLASTY N/A 03/16/2017   Procedure: Coronary Balloon Angioplasty;  Surgeon: Corky CraftsJayadeep S Varanasi, MD;  [facility];  Service: Cardiovascular;  Laterality: N/A;  . EP IMPLANTABLE DEVICE N/A 05/09/2015   Procedure:  Icd Implant;  Surgeon: Thurmon FairMihai Croitoru, MD;  Location: [facility] INVASIVE CV LAB;  Service: Cardiovascular;  Laterality: N/A;  . LEFT HEART CATH AND CORS/GRAFTS ANGIOGRAPHY N/A 03/14/2017   Procedure: Left Heart Cath and Cors/Grafts Angiography;  Surgeon: Corky CraftsJayadeep S Varanasi, MD;  [facility];  Service: Cardiovascular;  Laterality: N/A;  . LEFT HEART CATHETERIZATION WITH CORONARY ANGIOGRAM N/A 01/29/2015   Procedure: LEFT HEART CATHETERIZATION WITH CORONARY ANGIOGRAM;  Surgeon: Lennette Biharihomas A Kelly, MD;  [facility];  Service: Cardiovascular;  Laterality: N/A;  . THORACIC AORTOGRAM N/A 03/16/2017   Procedure: Thoracic Aortogram;  Surgeon: Corky CraftsJayadeep S Varanasi, MD;  [facility];  Service: Cardiovascular;  Laterality: N/A;  . TONSILLECTOMY          Home Medications    Prior to Admission medications   Medication Sig Start Date End Date Taking? Authorizing Provider  acetaminophen (TYLENOL) 325 MG tablet Take 1-2 tablets (325-650 mg total) by mouth every 4 (four) hours as needed for mild pain. 05/10/15  Yes Kilroy, Luke K, PA-C  allopurinol (ZYLOPRIM) 100 MG tablet Take 100 mg by mouth daily.   Yes [provider]  amiodarone (PACERONE) 200 MG tablet Take 1 tablet (200 mg total) by mouth daily. 07/10/18  Yes Croitoru, Mihai, MD  atorvastatin (LIPITOR) 80 MG tablet take 1 tablet by mouth once daily 11/01/17  Yes Croitoru, Mihai, MD  cholecalciferol (VITAMIN D) 1000 UNITS tablet Take 1,000 Units by mouth every other day.    Yes [provider]  clopidogrel (PLAVIX) 75 MG tablet TAKE 1 TABLET BY MOUTH DAILY 04/28/18  Yes Croitoru, Mihai, MD  ELIQUIS 5 MG TABS tablet take 1 tablet by mouth twice a day 10/17/17  Yes Croitoru, Mihai, MD  furosemide (LASIX) 40 MG tablet Take 2 tablets (80 mg total) by mouth 2 (two) times daily. NEED OV. Patient taking differently: Take 80 mg by mouth daily. NEED OV. 04/13/18  Yes Croitoru, Mihai, MD  Hyaluronan (ORTHOVISC) 30  MG/2ML SOSY Inject 30 mg into the articular space daily. 05/22/18  Yes [provider]  insulin glargine (LANTUS) 100 UNIT/ML injection Inject 0.5 mLs (50 Units total) into the skin every morning. 03/17/17  Yes Seiler, Amber K, NP  insulin lispro (HUMALOG KWIKPEN) 100 UNIT/ML KiwkPen INJ 32 UNI [place] AT THE BEGINNING OF LARGEST MEAL 03/22/18  Yes [provider]  INVOKANA 100 MG TABS Take 100 mg by mouth daily. 12/28/13  Yes [provider]  metoprolol succinate (TOPROL-XL) 50 MG 24 hr tablet Take 1 tablet (50 mg total) by mouth daily. NEED OV. 04/13/18  Yes Croitoru, Mihai, MD  nitroGLYCERIN (NITROSTAT) 0.4 MG SL tablet place 1 tablet under the tongue if needed every 5 minutes for chest pain 06/30/16  Yes [provider]  Omega-3 Fatty Acids (FISH OIL) 1200 MG CAPS Take 1,200 mg by mouth daily.    Yes [provider]  sacubitril-valsartan (ENTRESTO) 24-26 MG Take 1 tablet by mouth 2 (two) times daily. 05/16/18  Yes Croitoru, Mihai, MD  sertraline (ZOLOFT) 50 MG tablet Take 1 tablet (  50 mg total) by mouth daily. 04/20/18  Yes Duke Salvia, MD  VICTOZA 18 MG/3ML SOPN Inject 1.2 mg into the skin daily. 06/02/16  Yes [provider]    Family History Family History  Problem Relation Age of Onset  . Heart failure Mother   . Dementia Mother   . Hearing loss Other   . Hypertension Other     Social History Social History   Tobacco Use  . Smoking status: Former Smoker    Types: Cigarettes    Last attempt to quit: 03/10/2017    Years since quitting: 1.3  . Smokeless tobacco: Never Used  . Tobacco comment: "1-2 cigarettes per day"  Substance Use Topics  . Alcohol use: Yes    Alcohol/week: 0.0 oz    Comment: rare  . Drug use: No     Allergies   Coreg [carvedilol]   Review of Systems Review of Systems  Cardiovascular: Positive for chest pain.  All other systems reviewed and are negative.    Physical Exam Updated Vital Signs BP 124/77    Pulse (!) 59   Temp 98 F (36.7 C) (Oral)   Resp 14   SpO2 98%   Physical Exam  Constitutional: He is oriented to person, place, and time. He appears well-developed and well-nourished.  HENT:  Head: Normocephalic and atraumatic.  Cardiovascular: Normal rate and regular rhythm.  No murmur heard. Pulmonary/Chest: Effort normal. No respiratory distress.  Decreased air movement bilaterally  Abdominal: Soft. There is no tenderness. There is no rebound and no guarding.  Musculoskeletal: He exhibits edema. He exhibits no tenderness.  Nonpitting edema to BLE, abdominal wall  Neurological: He is alert and oriented to person, place, and time.  Skin: Skin is warm. He is diaphoretic.  Psychiatric: He has a normal mood and affect. His behavior is normal.  Nursing note and vitals reviewed.    ED Treatments / Results  Labs (all labs ordered are listed, but only abnormal results are displayed) Labs Reviewed  BASIC METABOLIC PANEL - Abnormal; Notable for the following components:      Result Value   Glucose, Bld 146 (*)    Creatinine, Ser 1.61 (*)    GFR calc non Af Amer 42 (*)    GFR calc Af Amer 48 (*)    All other components within normal limits  CBC - Abnormal; Notable for the following components:   WBC 10.7 (*)    Hemoglobin 11.7 (*)    MCV 77.0 (*)    MCH 20.8 (*)    MCHC 27.0 (*)    RDW 19.6 (*)    All other components within normal limits  I-STAT CHEM 8, ED - Abnormal; Notable for the following components:   Creatinine, Ser 1.60 (*)    Glucose, Bld 143 (*)    Calcium, Ion 1.06 (*)    All other components within normal limits  TROPONIN I  APTT  HEPARIN LEVEL (UNFRACTIONATED)  BRAIN NATRIURETIC PEPTIDE  I-STAT TROPONIN, ED  I-STAT ARTERIAL BLOOD GAS, ED    EKG EKG Interpretation  Date/Time:  Friday July 21 2018 13:17:50 EDT Ventricular Rate:  73 PR Interval:  280 QRS Duration: 122 QT Interval:  418 QTC Calculation: 460 R Axis:   53 Text Interpretation:  **  Critical Test Result: STEMI Atrial-paced rhythm with prolonged AV conduction Cannot rule out Anterior infarct , age undetermined Inferior injury pattern ** ** ACUTE MI / STEMI ** ** Consider right ventricular involvement in acute inferior infarct  Abnormal ECG Confirmed by Tilden Fossa [phone]) on 07/21/2018 1:49:48 PM   Radiology Dg Chest Port 1 View  Result Date: 07/21/2018 CLINICAL DATA:  72 year old male with chest pain onset several hours ago. Shortness of breath and nausea. EXAM: PORTABLE CHEST 1 VIEW COMPARISON:  Chest radiographs 03/10/2017 and earlier. FINDINGS: Portable AP semi upright view at 1343 hours. Prior CABG. Stable cardiomegaly and mediastinal contours. Stable left chest cardiac AICD. The patient is rotated to the left. Mildly lower lung volumes. No pneumothorax, pulmonary edema, pleural effusion or consolidation. Paucity of bowel gas in the upper abdomen. Visualized tracheal air column is within normal limits. IMPRESSION: No acute cardiopulmonary abnormality. Electronically Signed   By: Odessa Fleming M.D.   On: 07/21/2018 13:55    Procedures Procedures (including critical care time) CRITICAL CARE Performed by: Tilden Fossa   Total critical care time: 45 minutes  Critical care time was exclusive of separately billable procedures and treating other patients.  Critical care was necessary to treat or prevent imminent or life-threatening deterioration.  Critical care was time spent personally by me on the following activities: development of treatment plan with patient and/or surrogate as well as nursing, discussions with consultants, evaluation of patient's response to treatment, examination of patient, obtaining history from patient or surrogate, ordering and performing treatments and interventions, ordering and review of laboratory studies, ordering and review of radiographic studies, pulse oximetry and re-evaluation of patient's condition.  Medications Ordered in ED Medications    heparin ADULT infusion 100 units/mL (25000 units/251mL sodium chloride 0.45%) (1,100 Units/hr Intravenous New Bag/Given 07/21/18 1344)  nitroGLYCERIN 50 mg in dextrose 5 % 250 mL (0.2 mg/mL) infusion (25 mcg/min Intravenous Rate/Dose Change 07/21/18 1439)  furosemide (LASIX) 10 MG/ML injection (has no administration in time range)  allopurinol (ZYLOPRIM) tablet 100 mg (has no administration in time range)  amiodarone (PACERONE) tablet 200 mg (has no administration in time range)  atorvastatin (LIPITOR) tablet 80 mg (has no administration in time range)  cholecalciferol (VITAMIN D) tablet 1,000 Units (has no administration in time range)  clopidogrel (PLAVIX) tablet 75 mg (has no administration in time range)  insulin glargine (LANTUS) injection 50 Units (has no administration in time range)  insulin lispro (HUMALOG) KwikPen 32 Units (has no administration in time range)  canagliflozin (INVOKANA) tablet 100 mg (has no administration in time range)  metoprolol succinate (TOPROL-XL) 24 hr tablet 50 mg (has no administration in time range)  Fish Oil CAPS 1,200 mg (has no administration in time range)  sacubitril-valsartan (ENTRESTO) 24-26 mg per tablet (has no administration in time range)  sertraline (ZOLOFT) tablet 50 mg (has no administration in time range)  liraglutide (VICTOZA) SOPN 1.2 mg (has no administration in time range)  aspirin chewable tablet 324 mg (324 mg Oral Given 07/21/18 1346)  furosemide (LASIX) injection 40 mg (40 mg Intravenous Given 07/21/18 1418)  morphine 4 MG/ML injection 4 mg (4 mg Intravenous Given 07/21/18 1435)     Initial Impression / Assessment and Plan / ED Course  I have reviewed the triage vital signs and the nursing notes.  Pertinent labs & imaging results that were available during my care of the patient were reviewed by me and considered in my medical decision making (see chart for details).     Patient here for sudden onset chest pain, sob.  EKG concerning for  ACS and cardiology consulted.  He was evaluated by Cardiology in the ED, started on heparin, nitroglycerin drip, lasix.  On repeat assessment his chest  pain did resolve.  Plan to admit to Cardiology service for further treatment.     Records reviewed in Epic.  Current presentation is not c/w PE, pna, dissection.   Final Clinical Impressions(s) / ED Diagnoses   Final diagnoses:  None    ED Discharge Orders    None       Tilden Fossa, MD 07/21/18 1659

## 2018-07-21 NOTE — ED Triage Notes (Signed)
Pt in c/o central chest pain that started a few hours ago, pain is to the center of his chest, denies radiation, also shortness of breath, also nausea

## 2018-07-21 NOTE — H&P (Addendum)
Cardiology Admission History and Physical:   Patient ID: [name]; MRN: [id number]; DOB: 09/25/1946   Admission date: 07/21/2018  Primary Care Provider: Devra Dopp, MD Primary Cardiologist:  Dr Royann Shivers Primary Electrophysiologist:  Dr Graciela Husbands  Chief Complaint:  Chest pain  Patient Profile:   [name] is a 72 y.o. male with a history of  severe CAD and cardiomyopathy, combined systolic and diastolic HF, s/p ICD, Sustained ventricular tachycardia, PAF, morbid obesity.  He has been having worsening DOE and wheezing this week.  He does not weigh daily.  He has chronic orthopnea and has had some PND this week.  He denies any history of sleep apnea.  He denies lower extremity edema.  History of Present Illness:   [name] developed chest pain about 11 am today. He describes the pain as going across his chest. It was 10/10, associated w/ nausea, dry heaves, diaphoresis.  He took a sublingual nitroglycerin, abdominal better.  When it did not resolve, he came to emergency room.  In the ER, his ECG is abnormal and initially a code STEMI was called.  Dr. Okey Dupre came down to evaluate the patient.  He continues to have chest pain at a 6/10.  However, he is also short of breath and has some CHF on his chest x-ray.  His ECG, although abnormal, is only mildly different from his previous ECGs and therefore does not meet STEMI criteria.  His symptoms are improving on medical therapy.   Past Medical History:  Diagnosis Date  . CAD (coronary artery disease)    a. CABG 1995 and multiple stents. b. Known graft occlusions - NSTEMI 06/2016 s/p DES to distal SVG-diag.  . Chronic systolic CHF (congestive heart failure) (HCC)   . CKD (chronic kidney disease), stage III (HCC)   . Dietary noncompliance   . DM (diabetes mellitus) (HCC)   . DM type 2 causing CKD stage 3 (HCC)   . Dyslipidemia - low HDL and high triglycerides   . Gout   . Ischemic cardiomyopathy    a. LVEF 10-15% by cath  06/2016.  Marland Kitchen Leukocytosis   . Morbid obesity (HCC)   . Myocardial infarction (HCC)   . NSTEMI (non-ST elevated myocardial infarction) (HCC) 06/26/2016  . PAF (paroxysmal atrial fibrillation) (HCC)   . PONV (postoperative nausea and vomiting)   . Systemic hypertension   . Unstable angina (HCC) 07/21/2018    Past Surgical History:  Procedure Laterality Date  . CARDIAC CATHETERIZATION N/A 06/28/2016   Procedure: Left Heart Cath and Cors/Grafts Angiography;  Surgeon: Lennette Bihari, MD;  Location: [facility] INVASIVE CV LAB;  Service: Cardiovascular;  Laterality: N/A;  . CARDIAC CATHETERIZATION N/A 06/29/2016   Procedure: Coronary Stent Intervention;  Surgeon: Lennette Bihari, MD;  Location: [facility] INVASIVE CV LAB;  Service: Cardiovascular;  Laterality: N/A;  . CORONARY ANGIOPLASTY WITH STENT PLACEMENT  01/12/2008   multivessel CAD occluded vein grafts to multiple sites including RCA,obtuse marginal branch & CX.  Successful PTCA and stenting distal graft.  . CORONARY ARTERY BYPASS GRAFT  1995  . CORONARY BALLOON ANGIOPLASTY N/A 03/16/2017   Procedure: Coronary Balloon Angioplasty;  Surgeon: Corky Crafts, MD;  Location: [facility] INVASIVE CV LAB;  Service: Cardiovascular;  Laterality: N/A;  . EP IMPLANTABLE DEVICE N/A 05/09/2015   Procedure: Icd Implant;  Surgeon: Thurmon Fair, MD;  Location: [facility] INVASIVE CV LAB;  Service: Cardiovascular;  Laterality: N/A;  . LEFT HEART CATH AND CORS/GRAFTS ANGIOGRAPHY N/A 03/14/2017   Procedure: Left Heart Cath  and Cors/Grafts Angiography;  Surgeon: Corky Crafts, MD;  Location: [facility] INVASIVE CV LAB;  Service: Cardiovascular;  Laterality: N/A;  . LEFT HEART CATHETERIZATION WITH CORONARY ANGIOGRAM N/A 01/29/2015   Procedure: LEFT HEART CATHETERIZATION WITH CORONARY ANGIOGRAM;  Surgeon: Lennette Bihari, MD;  Location: [facility] CATH LAB;  Service: Cardiovascular;  Laterality: N/A;  . THORACIC AORTOGRAM N/A 03/16/2017   Procedure: Thoracic Aortogram;  Surgeon: Corky Crafts, MD;   Location: [facility] INVASIVE CV LAB;  Service: Cardiovascular;  Laterality: N/A;  . TONSILLECTOMY       Medications Prior to Admission: Prior to Admission medications   Medication Sig Start Date Senya Hinzman Date Taking? Authorizing Provider  acetaminophen (TYLENOL) 325 MG tablet Take 1-2 tablets (325-650 mg total) by mouth every 4 (four) hours as needed for mild pain. 05/10/15   Abelino Derrick, PA-C  allopurinol (ZYLOPRIM) 100 MG tablet Take 100 mg by mouth daily.    [provider]  amiodarone (PACERONE) 200 MG tablet Take 1 tablet (200 mg total) by mouth daily. 07/10/18   Croitoru, Mihai, MD  atorvastatin (LIPITOR) 80 MG tablet take 1 tablet by mouth once daily 11/01/17   Croitoru, Mihai, MD  cholecalciferol (VITAMIN D) 1000 UNITS tablet Take 1,000 Units by mouth every other day.     [provider]  clopidogrel (PLAVIX) 75 MG tablet TAKE 1 TABLET BY MOUTH DAILY 04/28/18   Croitoru, Mihai, MD  ELIQUIS 5 MG TABS tablet take 1 tablet by mouth twice a day 10/17/17   Croitoru, Mihai, MD  furosemide (LASIX) 40 MG tablet Take 2 tablets (80 mg total) by mouth 2 (two) times daily. NEED OV. 04/13/18   Croitoru, Mihai, MD  insulin glargine (LANTUS) 100 UNIT/ML injection Inject 0.5 mLs (50 Units total) into the skin every morning. 03/17/17   Seiler, Amber K, NP  insulin lispro (HUMALOG KWIKPEN) 100 UNIT/ML KiwkPen INJ 32 UNI [place] AT THE BEGINNING OF LARGEST MEAL 03/22/18   [provider]  INVOKANA 100 MG TABS Take 100 mg by mouth daily. 12/28/13   [provider]  metoprolol succinate (TOPROL-XL) 50 MG 24 hr tablet Take 1 tablet (50 mg total) by mouth daily. NEED OV. 04/13/18   Croitoru, Rachelle Hora, MD  nitroGLYCERIN (NITROSTAT) 0.4 MG SL tablet place 1 tablet under the tongue if needed every 5 minutes for chest pain 06/30/16   [provider]  Omega-3 Fatty Acids (FISH OIL) 1200 MG CAPS Take 1,200 mg by mouth daily.     [provider]  sacubitril-valsartan (ENTRESTO) 24-26 MG Take  1 tablet by mouth 2 (two) times daily. 05/16/18   Croitoru, Mihai, MD  sertraline (ZOLOFT) 50 MG tablet Take 1 tablet (50 mg total) by mouth daily. 04/20/18   Duke Salvia, MD  VICTOZA 18 MG/3ML SOPN Inject 1.2 mg into the skin daily. 06/02/16   [provider]     Allergies:    Allergies  Allergen Reactions  . Coreg [Carvedilol] Nausea And Vomiting    Social History:   Social History   Socioeconomic History  . Marital status: Married    Spouse name: Not on file  . Number of children: Not on file  . Years of education: Not on file  . Highest education level: Not on file  Occupational History  . Not on file  Social Needs  . Financial resource strain: Not on file  . Food insecurity:    Worry: Not on file    Inability: Not on file  . Transportation  needs:    Medical: Not on file    Non-medical: Not on file  Tobacco Use  . Smoking status: Former Smoker    Types: Cigarettes    Last attempt to quit: 03/10/2017    Years since quitting: 1.3  . Smokeless tobacco: Never Used  . Tobacco comment: "1-2 cigarettes per day"  Substance and Sexual Activity  . Alcohol use: Yes    Alcohol/week: 0.0 oz    Comment: rare  . Drug use: No  . Sexual activity: Not on file  Lifestyle  . Physical activity:    Days per week: Not on file    Minutes per session: Not on file  . Stress: Not on file  Relationships  . Social connections:    Talks on phone: Not on file    Gets together: Not on file    Attends religious service: Not on file    Active member of club or organization: Not on file    Attends meetings of clubs or organizations: Not on file    Relationship status: Not on file  . Intimate partner violence:    Fear of current or ex partner: Not on file    Emotionally abused: Not on file    Physically abused: Not on file    Forced sexual activity: Not on file  Other Topics Concern  . Not on file  Social History Narrative  . Not on file    Family History:   The patient's  family history includes Dementia in his mother; Hearing loss in his other; Heart failure in his mother; Hypertension in his other.   The patient indicated that his mother is deceased. He indicated that his father is deceased. He indicated that his maternal grandmother is deceased. He indicated that his maternal grandfather is deceased. He indicated that his paternal grandmother is deceased. He indicated that his paternal grandfather is deceased. He indicated that the status of his other is unknown.    ROS:  Please see the history of present illness.  All other ROS reviewed and negative.     Physical Exam/Data:   Vitals:   07/21/18 1345 07/21/18 1400 07/21/18 1430 07/21/18 1500  BP: (!) 159/89 (!) 144/89 (!) 148/81 133/63  Pulse: 61 60 62 63  Resp: (!) 25 20 16 18   Temp:      TempSrc:      SpO2: 99% 99% 99% 96%   General:  Well nourished, well developed, in moderate respiratory distress HEENT: normal Lymph: no adenopathy Neck:  JVD  not assessed due to body habitus Endocrine:  No thryomegaly Vascular: No carotid bruits; 4/4 extremity 2+  Cardiac:  normal S1, S2; RRR; no murmur, no rub or gallop  Lungs: Decreased breath sounds bases with some rales bilaterally, no wheezing, rhonchi   Abd: soft, nontender, no hepatomegaly  Ext: No edema Musculoskeletal:  No deformities, BUE and BLE strength normal and equal Skin: warm and dry  Neuro:  CNs 2-12 intact, no focal abnormalities noted Psych:  Normal affect    EKG:  The ECG that was done today was personally reviewed and demonstrates atrial pacing, left bundle is old, no significant change from 04/20/2018  Relevant CV Studies:  ECHO: 03/14/2017 - Left ventricle: The cavity size was mildly dilated. There was   moderate concentric hypertrophy. Systolic function was severely   reduced. The estimated ejection fraction was in the range of 15%   to 20%. Severe, diffuse hypokinesis. Doppler parameters are   consistent with a reversible  restrictive pattern, indicative of   decreased left ventricular diastolic compliance and/or increased   left atrial pressure (grade 3 diastolic dysfunction). Doppler   parameters are consistent with high ventricular filling pressure. - Aortic valve: Transvalvular velocity was within the normal range.   There was no stenosis. There was no regurgitation. - Mitral valve: There was no regurgitation. - Left atrium: The atrium was moderately dilated. - Right ventricle: Poorly visualized. - Tricuspid valve: There was no regurgitation.  CATH: PTCA  Dist Graft to Insertion lesion, 75 %stenosed. Treated with PTCA with 2.5 mm balloon.  Post intervention, there is a 10% residual stenosis.  No evidence of thoracic dissection.   Continue clopidogrel.  Restart heparin 6 hours post sheath.  Eliquis in AM.    If cath is needed in the future, would not use right radial approach for SVG to diagonal vein graft.  Post-Intervention Diagram        Laboratory Data:  Chemistry Recent Labs  Lab 07/21/18 1328 07/21/18 1343  NA 141 140  K 4.2 5.1  CL 105 104  CO2 24  --   GLUCOSE 146* 143*  BUN 13 19  CREATININE 1.61* 1.60*  CALCIUM 8.9  --   GFRNONAA 42*  --   GFRAA 48*  --   ANIONGAP 12  --     No results for input(s): PROT, ALBUMIN, AST, ALT, ALKPHOS, BILITOT in the last 168 hours. Hematology Recent Labs  Lab 07/21/18 1328 07/21/18 1343  WBC 10.7*  --   RBC 5.62  --   HGB 11.7* 14.3  HCT 43.3 42.0  MCV 77.0*  --   MCH 20.8*  --   MCHC 27.0*  --   RDW 19.6*  --   PLT 316  --    Cardiac EnzymesNo results for input(s): TROPONINI in the last 168 hours.  Recent Labs  Lab 07/21/18 1341  TROPIPOC 0.01    BNPNo results for input(s): BNP, PROBNP in the last 168 hours.  DDimer No results for input(s): DDIMER in the last 168 hours.  Radiology/Studies:  Dg Chest Port 1 View  Result Date: 07/21/2018 CLINICAL DATA:  72 year old male with chest pain onset several hours ago.  Shortness of breath and nausea. EXAM: PORTABLE CHEST 1 VIEW COMPARISON:  Chest radiographs 03/10/2017 and earlier. FINDINGS: Portable AP semi upright view at 1343 hours. Prior CABG. Stable cardiomegaly and mediastinal contours. Stable left chest cardiac AICD. The patient is rotated to the left. Mildly lower lung volumes. No pneumothorax, pulmonary edema, pleural effusion or consolidation. Paucity of bowel gas in the upper abdomen. Visualized tracheal air column is within normal limits. IMPRESSION: No acute cardiopulmonary abnormality. Electronically Signed   By: Odessa Fleming M.D.   On: 07/21/2018 13:55    Assessment and Plan:   Principal Problem: 1.  Unstable angina (HCC) -It is unclear if the respiratory issues are causing the chest pain or the other way around. - ECG with no critical changes and initial enzymes are negative. - Avoiding cath today would be preferred as he has been on Eliquis. - Initial trial of medical treatment with nitrates and pain medications and continue to cycle enzymes. - May have to cath with increased risk if his symptoms cannot be controlled.  Active Problems: 2.  Acute on chronic combined systolic (congestive) and diastolic (congestive) heart failure (HCC) - Heart failure exacerbation may be the cause of his chest pain. - Give 1 dose of Lasix 40 mg and follow results - Check a BNP -He  has been wheezing, this may have been secondary to cardiac issues - If symptoms improve, continue diuresis and follow renal function closely  3.  CKD (chronic kidney disease), stage III (HCC) - Limit dye use and any studies needed -Follow with diuresis    Severity of Illness: The appropriate patient status for this patient is INPATIENT. Inpatient status is judged to be reasonable and necessary in order to provide the required intensity of service to ensure the patient's safety. The patient's presenting symptoms, physical exam findings, and initial radiographic and laboratory data in  the context of their chronic comorbidities is felt to place them at high risk for further clinical deterioration. Furthermore, it is not anticipated that the patient will be medically stable for discharge from the hospital within 2 midnights of admission. The following factors support the patient status of inpatient.   " The patient's presenting symptoms include chest pain shortness of breath. " The worrisome physical exam findings include chest pain and shortness of breath " The initial radiographic and laboratory data are worrisome because of abnormal chest x-ray and ECG. " The chronic co-morbidities include history of coronary artery disease and CHF.   * I certify that at the point of admission it is my clinical judgment that the patient will require inpatient hospital care spanning beyond 2 midnights from the point of admission due to high intensity of service, high risk for further deterioration and high frequency of surveillance required.*    For questions or updates, please contact [facility] Please consult [email or web address] for contact info under Cardiology/STEMI.    Signed, Theodore Demark, PA-C  07/21/2018 3:13 PM    I have seen and examined the patient and agree with the findings and plan, as documented in the PA/NP's note, with the following additions/changes.  [name] is a 72 year old man with severe multivessel CAD status post CABG, ischemic cardiomyopathy with severely reduced LVEF, sustained ventricular tachycardia, paroxysmal atrial fibrillation, and morbid obesity, presenting with acute onset of chest pain and progressive wheezing and shortness of breath over the last week.  He describes severe, sharp chest pain (7/10) that began at rest earlier today.  It is different than the angina he has experienced in the past.  It seems to be somewhat related to deep inspiration.  Pain seemed to improve a little bit with sublingual nitroglycerin.  EKG shows atrial paced rhythm with  nonspecific intraventricular conduction delay.  There is 1 mm of ST elevation isolated to lead III, which was present on prior tracing from May.  ST depression is also noted in V2.  Inferolateral and anterolateral T wave inversions are present.  Physical exam is notable for a morbidly obese man lying in bed.  Heart sounds are distant but regular without murmurs.  Breath sounds are diffusely diminished with faint bibasilar crackles.  There is trace pretibial edema.  No obvious JVD, though body habitus limits evaluation.  Labs are notable for potassium of 4.2, creatinine 1.6, point-of-care troponin 0 0.01, white blood cell count 10.7, hemoglobin 11.7, platelets 316.  I reviewed his catheterization from last year demonstrating severe native CAD.  Only patent conduits are SVG to diagonal, which collateralizes much of the lateral wall and LIMA to LAD.  Significant in-stent restenosis of the SVG to diagonal was identified, which was treated with balloon angioplasty.  Certainly [name] symptoms are concerning for unstable angina.  His EKG is abnormal with subtle changes from prior tracings, though it does not meet STEMI criteria.  Additionally, isolated ST  elevation in lead III was present in May.  I favor medical therapy given his known severe CAD, severely reduced LVEF, and concurrent heart failure.  I recommend nitroglycerin infusion for relief of chest pain as well as diuresis.  If pain persists despite aggressive medical therapy or he has a significant bump in his troponin, catheterization will need to be reconsidered considered.  I recommend holding apixaban and starting heparin infusion when he would be due for his next dose of apixaban.  Yvonne Kendallhristopher Kalysta Kneisley, MD [facility] Pager: [phone]

## 2018-07-21 NOTE — ED Notes (Signed)
Attempted report x1. Left call back number with unit secretary.  

## 2018-07-22 LAB — CBC
HEMATOCRIT: 34.8 % — AB (ref 39.0–52.0)
HEMOGLOBIN: 10 g/dL — AB (ref 13.0–17.0)
MCH: 21.7 pg — ABNORMAL LOW (ref 26.0–34.0)
MCHC: 28.7 g/dL — ABNORMAL LOW (ref 30.0–36.0)
MCV: 75.7 fL — AB (ref 78.0–100.0)
Platelets: 257 10*3/uL (ref 150–400)
RBC: 4.6 MIL/uL (ref 4.22–5.81)
RDW: 18.6 % — ABNORMAL HIGH (ref 11.5–15.5)
WBC: 10.5 10*3/uL (ref 4.0–10.5)

## 2018-07-22 LAB — BASIC METABOLIC PANEL
ANION GAP: 8 (ref 5–15)
BUN: 16 mg/dL (ref 8–23)
CO2: 28 mmol/L (ref 22–32)
Calcium: 8.5 mg/dL — ABNORMAL LOW (ref 8.9–10.3)
Chloride: 106 mmol/L (ref 98–111)
Creatinine, Ser: 1.64 mg/dL — ABNORMAL HIGH (ref 0.61–1.24)
GFR calc Af Amer: 47 mL/min — ABNORMAL LOW (ref >60)
GFR calc non Af Amer: 41 mL/min — ABNORMAL LOW (ref >60)
Glucose, Bld: 107 mg/dL — ABNORMAL HIGH (ref 70–99)
Potassium: 3.6 mmol/L (ref 3.5–5.1)
Sodium: 142 mmol/L (ref 135–145)

## 2018-07-22 LAB — TROPONIN I
TROPONIN I: 6.09 ng/mL — AB (ref <0.03)
Troponin I: 4.53 ng/mL (ref <0.03)

## 2018-07-22 LAB — GLUCOSE, CAPILLARY
Glucose-Capillary: 178 mg/dL — ABNORMAL HIGH (ref 70–99)
Glucose-Capillary: 178 mg/dL — ABNORMAL HIGH (ref 70–99)

## 2018-07-22 LAB — APTT
aPTT: 62 seconds — ABNORMAL HIGH (ref 24–36)
aPTT: 73 seconds — ABNORMAL HIGH (ref 24–36)

## 2018-07-22 LAB — HEPARIN LEVEL (UNFRACTIONATED)
HEPARIN UNFRACTIONATED: 0.81 [IU]/mL — AB (ref 0.30–0.70)
HEPARIN UNFRACTIONATED: 0.75 [IU]/mL — AB (ref 0.30–0.70)

## 2018-07-22 MED ORDER — LORAZEPAM 1 MG PO TABS
1.0000 mg | ORAL_TABLET | Freq: Once | ORAL | Status: AC
  Administered 2018-07-22: 1 mg via ORAL
  Filled 2018-07-22: qty 1

## 2018-07-22 NOTE — Progress Notes (Signed)
Cardiology paged. Patient complained of CP 6/10. EKG taken. No change noted from previous EKG. BP 106/51 MAP 67. Cannot go up on nitro gtt. Cardiology notified of this information.

## 2018-07-22 NOTE — Progress Notes (Signed)
ANTICOAGULATION CONSULT NOTE   Pharmacy Consult for Heparin Indication: chest pain/ACS and atrial fibrillation  Allergies  Allergen Reactions  . Coreg [Carvedilol] Nausea And Vomiting    Patient Measurements: Height: 6\' 7"  (200.7 cm) Weight: (!) 412 lb (186.9 kg) IBW/kg (Calculated) : 93.7  HEPARIN DW (KG): 138.2  Vital Signs: BP: 113/59 (08/03 0838) Pulse Rate: 59 (08/03 0838)  Labs: Recent Labs    07/21/18 1328 07/21/18 1343  07/21/18 2016 07/21/18 2217 07/22/18 0311 07/22/18 0723 07/22/18 1542  HGB 11.7* 14.3  --  11.1*  --  10.0*  --   --   HCT 43.3 42.0  --  39.7  --  34.8*  --   --   PLT 316  --   --  231  --  257  --   --   APTT  --   --   --   --  51*  --  62* 73*  HEPARINUNFRC  --   --   --   --  0.99*  --  0.81* 0.75*  CREATININE 1.61* 1.60*  --  1.56*  --  1.64*  --   --   TROPONINI  --   --    < > 0.60*  --  4.53* 6.09*  --    < > = values in this interval not displayed.    Estimated Creatinine Clearance: 77.7 mL/min (A) (by C-G formula based on SCr of 1.64 mg/dL (H)).   Medical History: Past Medical History:  Diagnosis Date  . CAD (coronary artery disease)    a. CABG 1995 and multiple stents. b. Known graft occlusions - NSTEMI 06/2016 s/p DES to distal SVG-diag.  . Chronic systolic CHF (congestive heart failure) (HCC)   . CKD (chronic kidney disease), stage III (HCC)   . Dietary noncompliance   . DM (diabetes mellitus) (HCC)   . DM type 2 causing CKD stage 3 (HCC)   . Dyslipidemia - low HDL and high triglycerides   . Gout   . Ischemic cardiomyopathy    a. LVEF 10-15% by cath 06/2016.  Marland Kitchen. Leukocytosis   . Morbid obesity (HCC)   . Myocardial infarction (HCC)   . NSTEMI (non-ST elevated myocardial infarction) (HCC) 06/26/2016  . PAF (paroxysmal atrial fibrillation) (HCC)   . PONV (postoperative nausea and vomiting)   . Systemic hypertension   . Unstable angina (HCC) 07/21/2018    Medications:  Infusions:  . heparin 1,450 Units/hr (07/22/18 1029)   . nitroGLYCERIN 50 mcg/min (07/22/18 1107)    Assessment: 72 yom presented to the ED as a Code STEMI. To start IV heparin. Pt is on apixaban PTA for history of afib. He reports his last dose was 8/1 at ~9pm. Baseline CBC with Hgb 14.3 and platelets 316.  APTT therapeutic at 73 while heparin level elevated at 0.75. Will continue using aPTT to guide dosing for now given apixaban influence on heparin levels.  Hgb 10, but platelets stable, no documented bleeding.  Goal of Therapy:  Heparin level 0.3-0.7 units/ml aPTT 66-103 seconds Monitor platelets by anticoagulation protocol: Yes   Plan:  Continue heparin drip at 1450 units/hr Daily aPPT, heparin level, CBC, Monitor for bleeding  Ewing Schleinolton Jaxsen Bernhart, PharmD PGY1 Pharmacy Resident 07/22/2018    6:01 PM

## 2018-07-22 NOTE — Progress Notes (Signed)
Subjective:  Admitted with prolonged chest discomfort yesterday.  He is currently pain-free but his troponin has risen to 6.he is currently pain-free.  No complaints of shortness of breath.  Is currently on IV heparin.  Objective:  Vital Signs in the last 24 hours: BP (!) 113/59 (BP Location: Right Wrist)   Pulse (!) 59   Temp 97.7 F (36.5 C) (Oral)   Resp (!) 22   Ht 6\' 7"  (2.007 m)   Wt (!) 186.9 kg (412 lb)   SpO2 100%   BMI 46.41 kg/m   Physical Exam: Severely obese black male currently in no acute distress Lungs:  Clear Cardiac:  Regular rhythm, normal S1 and S2, no S3 Extremities:  Trace edema present  Intake/Output from previous day: 08/02 0701 - 08/03 0700 In: 138.2 [I.V.:138.2] Out: -   Weight Filed Weights   07/21/18 2128 07/22/18 0425  Weight: (!) 187.2 kg (412 lb 12.8 oz) (!) 186.9 kg (412 lb)    Lab Results: Basic Metabolic Panel: Recent Labs    07/21/18 1328 07/21/18 1343 07/21/18 2016 07/22/18 0311  NA 141 140  --  142  K 4.2 5.1  --  3.6  CL 105 104  --  106  CO2 24  --   --  28  GLUCOSE 146* 143*  --  107*  BUN 13 19  --  16  CREATININE 1.61* 1.60* 1.56* 1.64*   CBC: Recent Labs    07/21/18 2016 07/22/18 0311  WBC 10.6* 10.5  HGB 11.1* 10.0*  HCT 39.7 34.8*  MCV 75.9* 75.7*  PLT 231 257   Cardiac Enzymes: Troponin (Point of Care Test) Recent Labs    07/21/18 1341  TROPIPOC 0.01   Cardiac Panel (last 3 results) Recent Labs    07/21/18 2016 07/22/18 0311 07/22/18 0723  TROPONINI 0.60* 4.53* 6.09*    Telemetry: Atrial paced rhythm, IV conduction delay  Assessment/Plan:  1.  Non-STEMI 2.  Severe CAD not candidate for repeat bypass grafting with distal LAD disease and only remaining vein grafts is the diagonal 3.  Severe morbid obesity  Recommendations:  Very high risk at this point.  We'll keep on heparin over weekend and keep nothing by mouth for possible catheterization although his size and obesity makes this  technically difficult and  The fact that the remaining vein graft was stented needs to be checked.      Darden PalmerW. Spencer Lanisha Stepanian, Jr.  MD [facility]  07/22/2018, 11:31 AM

## 2018-07-22 NOTE — Progress Notes (Signed)
Checked on patient. Patient denies CP now. States that he thinks it was an episode of gas and anxiety. Cardiology notified and 1 mg of Ativan PO to be given.

## 2018-07-22 NOTE — Progress Notes (Signed)
ANTICOAGULATION CONSULT NOTE   Pharmacy Consult for Heparin Indication: chest pain/ACS and atrial fibrillation  Allergies  Allergen Reactions  . Coreg [Carvedilol] Nausea And Vomiting    Patient Measurements: Height: 6\' 7"  (200.7 cm) Weight: (!) 412 lb (186.9 kg) IBW/kg (Calculated) : 93.7  HEPARIN DW (KG): 138.2  Vital Signs: Temp: 97.7 F (36.5 C) (08/03 0425) Temp Source: Oral (08/03 0425) BP: 113/59 (08/03 0838) Pulse Rate: 59 (08/03 0838)  Labs: Recent Labs    07/21/18 1328 07/21/18 1343 07/21/18 1531 07/21/18 2016 07/21/18 2217 07/22/18 0311 07/22/18 0723  HGB 11.7* 14.3  --  11.1*  --  10.0*  --   HCT 43.3 42.0  --  39.7  --  34.8*  --   PLT 316  --   --  231  --  257  --   APTT  --   --   --   --  51*  --  62*  HEPARINUNFRC  --   --   --   --  0.99*  --  0.81*  CREATININE 1.61* 1.60*  --  1.56*  --  1.64*  --   TROPONINI  --   --  <0.03 0.60*  --  4.53*  --     Estimated Creatinine Clearance: 77.7 mL/min (A) (by C-G formula based on SCr of 1.64 mg/dL (H)).   Medical History: Past Medical History:  Diagnosis Date  . CAD (coronary artery disease)    a. CABG 1995 and multiple stents. b. Known graft occlusions - NSTEMI 06/2016 s/p DES to distal SVG-diag.  . Chronic systolic CHF (congestive heart failure) (HCC)   . CKD (chronic kidney disease), stage III (HCC)   . Dietary noncompliance   . DM (diabetes mellitus) (HCC)   . DM type 2 causing CKD stage 3 (HCC)   . Dyslipidemia - low HDL and high triglycerides   . Gout   . Ischemic cardiomyopathy    a. LVEF 10-15% by cath 06/2016.  Marland Kitchen. Leukocytosis   . Morbid obesity (HCC)   . Myocardial infarction (HCC)   . NSTEMI (non-ST elevated myocardial infarction) (HCC) 06/26/2016  . PAF (paroxysmal atrial fibrillation) (HCC)   . PONV (postoperative nausea and vomiting)   . Systemic hypertension   . Unstable angina (HCC) 07/21/2018    Medications:  Infusions:  . heparin 1,250 Units/hr (07/22/18 0020)  .  nitroGLYCERIN 50 mcg/min (07/22/18 0021)    Assessment: 72 yom presented to the ED as a Code STEMI. To start IV heparin. Pt is on apixaban PTA for history of afib. He reports his last dose was 8/1 at ~9pm. Baseline CBC with Hgb 14.3 and platelets 316.  APTT remains below goal this morning at 62 while heparin level elevated at 0.81. Will continue using aPTT to guide dosing for now given apixaban influence on heparin levels.  Hgb dropped to 10, but platelets stable, no documented bleeding.  Goal of Therapy:  Heparin level 0.3-0.7 units/ml aPTT 66-103 seconds Monitor platelets by anticoagulation protocol: Yes   Plan:  Increase heparin drip to 1450 units/hr Check aPTT/HL in 6 hours Daily CBC, Monitor for bleeding   Harlow MaresAmy Sabien Umland, PharmD PGY1 Pharmacy Resident Phone [phone]  07/22/2018   9:44 AM

## 2018-07-23 LAB — CBC
HCT: 34.3 % — ABNORMAL LOW (ref 39.0–52.0)
Hemoglobin: 9.5 g/dL — ABNORMAL LOW (ref 13.0–17.0)
MCH: 21 pg — AB (ref 26.0–34.0)
MCHC: 27.7 g/dL — ABNORMAL LOW (ref 30.0–36.0)
MCV: 75.9 fL — AB (ref 78.0–100.0)
Platelets: 218 10*3/uL (ref 150–400)
RBC: 4.52 MIL/uL (ref 4.22–5.81)
RDW: 18.7 % — AB (ref 11.5–15.5)
WBC: 10.6 10*3/uL — AB (ref 4.0–10.5)

## 2018-07-23 LAB — HEPARIN LEVEL (UNFRACTIONATED): Heparin Unfractionated: 0.68 IU/mL (ref 0.30–0.70)

## 2018-07-23 LAB — APTT: APTT: 69 s — AB (ref 24–36)

## 2018-07-23 MED ORDER — PNEUMOCOCCAL VAC POLYVALENT 25 MCG/0.5ML IJ INJ
0.5000 mL | INJECTION | INTRAMUSCULAR | Status: DC
  Filled 2018-07-23: qty 0.5

## 2018-07-23 NOTE — Progress Notes (Signed)
Subjective:  Continues to be pain-free.  Argumentative, sarcastic.  Complains of numbness and discomfort in his lower extremities.  Demanding to know what can be done if all of his grafts are occluded.  No chest discomfort suggestive of angina and not currently short of breath.  Objective:  Vital Signs in the last 24 hours: BP 107/83 (BP Location: Left Wrist)   Pulse 62   Temp 98.7 F (37.1 C) (Oral)   Resp (!) 25   Ht 6\' 7"  (2.007 m)   Wt (!) 190.1 kg (419 lb 1.6 oz)   SpO2 91%   BMI 47.21 kg/m   Physical Exam: Severely obese black male currently in no acute distress Lungs:  Clear Cardiac:  Regular rhythm, normal S1 and S2, no S3 Extremities:  Trace edema present  Intake/Output from previous day: 08/03 0701 - 08/04 0700 In: 2014.2 [P.O.:1055; I.V.:959.2] Out: 200 [Urine:200]  Weight Filed Weights   07/21/18 2128 07/22/18 0425 07/23/18 0520  Weight: (!) 187.2 kg (412 lb 12.8 oz) (!) 186.9 kg (412 lb) (!) 190.1 kg (419 lb 1.6 oz)    Lab Results: Basic Metabolic Panel: Recent Labs    07/21/18 1328 07/21/18 1343 07/21/18 2016 07/22/18 0311  NA 141 140  --  142  K 4.2 5.1  --  3.6  CL 105 104  --  106  CO2 24  --   --  28  GLUCOSE 146* 143*  --  107*  BUN 13 19  --  16  CREATININE 1.61* 1.60* 1.56* 1.64*   CBC: Recent Labs    07/22/18 0311 07/23/18 0421  WBC 10.5 10.6*  HGB 10.0* 9.5*  HCT 34.8* 34.3*  MCV 75.7* 75.9*  PLT 257 218   Cardiac Enzymes: Troponin (Point of Care Test) Recent Labs    07/21/18 1341  TROPIPOC 0.01   Cardiac Panel (last 3 results) Recent Labs    07/21/18 2016 07/22/18 0311 07/22/18 0723  TROPONINI 0.60* 4.53* 6.09*    Telemetry: Atrial paced rhythm, IV conduction delay  Assessment/Plan:  1.  Non-STEMI 2.  Severe CAD not candidate for repeat bypass grafting with distal LAD disease and only remaining vein grafts is the diagonal 3.  Severe morbid obesity  Recommendations:  Catheterization is set up for tomorrow.   Catheterization is challenging because of his severe morbid obesity all concentrated in the truncal region.  He wants to know if it can be done by the radial approach but I had told him that the catheterization doctor would best determined that because of access to the diagonal vein graft.Cardiac catheterization was discussed with the patient fully including risks of myocardial infarction, death, stroke, bleeding, arrhythmia, dye allergy, renal insufficiency or bleeding.  The patient understands and is willing to proceed.  Possibility of intervention at the same time also discussed with patient and they understand and are agreeable to proceed.       Darden PalmerW. Spencer Tilley, Jr.  MD [facility]ACC [facility]  07/23/2018, 11:50 AM

## 2018-07-23 NOTE — Progress Notes (Signed)
ANTICOAGULATION CONSULT NOTE   Pharmacy Consult for Heparin Indication: chest pain/ACS and atrial fibrillation  Allergies  Allergen Reactions  . Coreg [Carvedilol] Nausea And Vomiting    Patient Measurements: Height: 6\' 7"  (200.7 cm) Weight: (!) 419 lb 1.6 oz (190.1 kg) IBW/kg (Calculated) : 93.7  HEPARIN DW (KG): 138.2  Vital Signs: Temp: 98.7 F (37.1 C) (08/04 0520) Temp Source: Oral (08/04 0520) BP: 107/83 (08/04 1124) Pulse Rate: 62 (08/04 1124)  Labs: Recent Labs    07/21/18 1343  07/21/18 2016  07/22/18 0311 07/22/18 0723 07/22/18 1542 07/23/18 0421  HGB 14.3  --  11.1*  --  10.0*  --   --  9.5*  HCT 42.0  --  39.7  --  34.8*  --   --  34.3*  PLT  --   --  231  --  257  --   --  218  APTT  --   --   --    < >  --  62* 73* 69*  HEPARINUNFRC  --   --   --    < >  --  0.81* 0.75* 0.68  CREATININE 1.60*  --  1.56*  --  1.64*  --   --   --   TROPONINI  --    < > 0.60*  --  4.53* 6.09*  --   --    < > = values in this interval not displayed.    Estimated Creatinine Clearance: 78.4 mL/min (A) (by C-G formula based on SCr of 1.64 mg/dL (H)).   Medical History: Past Medical History:  Diagnosis Date  . CAD (coronary artery disease)    a. CABG 1995 and multiple stents. b. Known graft occlusions - NSTEMI 06/2016 s/p DES to distal SVG-diag.  . Chronic systolic CHF (congestive heart failure) (HCC)   . CKD (chronic kidney disease), stage III (HCC)   . Dietary noncompliance   . DM (diabetes mellitus) (HCC)   . DM type 2 causing CKD stage 3 (HCC)   . Dyslipidemia - low HDL and high triglycerides   . Gout   . Ischemic cardiomyopathy    a. LVEF 10-15% by cath 06/2016.  Marland Kitchen. Leukocytosis   . Morbid obesity (HCC)   . Myocardial infarction (HCC)   . NSTEMI (non-ST elevated myocardial infarction) (HCC) 06/26/2016  . PAF (paroxysmal atrial fibrillation) (HCC)   . PONV (postoperative nausea and vomiting)   . Systemic hypertension   . Unstable angina (HCC) 07/21/2018     Medications:  Infusions:  . heparin 1,450 Units/hr (07/23/18 0649)  . nitroGLYCERIN 50 mcg/min (07/23/18 13240649)    Assessment: 72 yom presented to the ED as a Code STEMI. To start IV heparin. Pt is on apixaban PTA for history of afib. He reports his last dose was 8/1 at ~9pm. Baseline CBC with Hgb 14.3 and platelets 316. Cardiac cath planned for tomorrow.  APTT therapeutic at 69 while heparin level is therapeutic at 0.68. Will continue using aPTT to guide dosing for now given apixaban influence on heparin levels, but gap seems to be closing. CBC is slowly trending down Hgb 9.5, plts 218, no documented bleeding.  Goal of Therapy:  Heparin level 0.3-0.7 units/ml aPTT 66-103 seconds Monitor platelets by anticoagulation protocol: Yes   Plan:  Continue heparin drip at 1450 units/hr Daily aPPT, heparin level, CBC, Monitor for bleeding  Ewing Schleinolton Whiteside, PharmD PGY1 Pharmacy Resident 07/23/2018    11:48 AM

## 2018-07-24 ENCOUNTER — Encounter (HOSPITAL_COMMUNITY)
Admission: EM | Disposition: A | Discharge status: Home Health | Payer: Self-pay | Source: Home / Self Care | Attending: Cardiology

## 2018-07-24 DIAGNOSIS — I214 Non-ST elevation (NSTEMI) myocardial infarction: Principal | ICD-10-CM

## 2018-07-24 HISTORY — PX: LEFT HEART CATH AND CORS/GRAFTS ANGIOGRAPHY: CATH118250

## 2018-07-24 LAB — CBC
HCT: 34.3 % — ABNORMAL LOW (ref 39.0–52.0)
Hemoglobin: 9.8 g/dL — ABNORMAL LOW (ref 13.0–17.0)
MCH: 21.4 pg — ABNORMAL LOW (ref 26.0–34.0)
MCHC: 28.6 g/dL — AB (ref 30.0–36.0)
MCV: 75.1 fL — AB (ref 78.0–100.0)
Platelets: 212 10*3/uL (ref 150–400)
RBC: 4.57 MIL/uL (ref 4.22–5.81)
RDW: 18.8 % — AB (ref 11.5–15.5)
WBC: 9.3 10*3/uL (ref 4.0–10.5)

## 2018-07-24 LAB — APTT: aPTT: 85 seconds — ABNORMAL HIGH (ref 24–36)

## 2018-07-24 LAB — GLUCOSE, CAPILLARY
GLUCOSE-CAPILLARY: 111 mg/dL — AB (ref 70–99)
Glucose-Capillary: 108 mg/dL — ABNORMAL HIGH (ref 70–99)
Glucose-Capillary: 181 mg/dL — ABNORMAL HIGH (ref 70–99)

## 2018-07-24 LAB — POCT ACTIVATED CLOTTING TIME: Activated Clotting Time: 136 seconds

## 2018-07-24 LAB — HEPARIN LEVEL (UNFRACTIONATED): Heparin Unfractionated: 0.63 IU/mL (ref 0.30–0.70)

## 2018-07-24 SURGERY — LEFT HEART CATH AND CORS/GRAFTS ANGIOGRAPHY
Anesthesia: LOCAL

## 2018-07-24 MED ORDER — LIDOCAINE HCL (PF) 1 % IJ SOLN
INTRAMUSCULAR | Status: DC | PRN
  Administered 2018-07-24: 25 mL

## 2018-07-24 MED ORDER — SODIUM CHLORIDE 0.9 % IV SOLN
INTRAVENOUS | Status: AC
  Administered 2018-07-24: 17:00:00 via INTRAVENOUS

## 2018-07-24 MED ORDER — ACETAMINOPHEN 325 MG PO TABS
650.0000 mg | ORAL_TABLET | ORAL | Status: DC | PRN
  Administered 2018-07-25: 650 mg via ORAL
  Filled 2018-07-24: qty 2

## 2018-07-24 MED ORDER — ASPIRIN 81 MG PO CHEW: 81.0000 mg | CHEWABLE_TABLET | Freq: Every day | ORAL | Status: DC

## 2018-07-24 MED ORDER — ONDANSETRON HCL 4 MG/2ML IJ SOLN: 4.0000 mg | Freq: Four times a day (QID) | INTRAMUSCULAR | Status: DC | PRN

## 2018-07-24 MED ORDER — SODIUM CHLORIDE 0.9 % IV SOLN: 250.0000 mL | INTRAVENOUS | Status: DC | PRN

## 2018-07-24 MED ORDER — MORPHINE SULFATE (PF) 2 MG/ML IV SOLN
2.0000 mg | INTRAVENOUS | Status: DC | PRN
  Administered 2018-07-24 – 2018-07-26 (×4): 2 mg via INTRAVENOUS
  Filled 2018-07-24 (×4): qty 1

## 2018-07-24 MED ORDER — SODIUM CHLORIDE 0.9% FLUSH: 3.0000 mL | INTRAVENOUS | Status: DC | PRN

## 2018-07-24 MED ORDER — ANGIOPLASTY BOOK
Freq: Once | Status: DC
  Filled 2018-07-24: qty 1

## 2018-07-24 MED ORDER — CLOPIDOGREL BISULFATE 75 MG PO TABS
75.0000 mg | ORAL_TABLET | Freq: Every day | ORAL | Status: DC
  Administered 2018-07-25: 08:00:00 75 mg via ORAL
  Filled 2018-07-24: qty 1

## 2018-07-24 MED ORDER — IOPAMIDOL (ISOVUE-370) INJECTION 76%
INTRAVENOUS | Status: AC
  Filled 2018-07-24: qty 125

## 2018-07-24 MED ORDER — SODIUM CHLORIDE 0.9% FLUSH
3.0000 mL | Freq: Two times a day (BID) | INTRAVENOUS | Status: DC
  Administered 2018-07-24: 3 mL via INTRAVENOUS

## 2018-07-24 MED ORDER — ASPIRIN 81 MG PO CHEW
81.0000 mg | CHEWABLE_TABLET | ORAL | Status: AC
  Administered 2018-07-24: 81 mg via ORAL
  Filled 2018-07-24: qty 1

## 2018-07-24 MED ORDER — HEART ATTACK BOUNCING BOOK
Freq: Once | Status: AC
  Administered 2018-07-24: 23:00:00
  Filled 2018-07-24: qty 1

## 2018-07-24 MED ORDER — HEPARIN (PORCINE) IN NACL 1000-0.9 UT/500ML-% IV SOLN
INTRAVENOUS | Status: DC | PRN
  Administered 2018-07-24 (×2): 500 mL

## 2018-07-24 MED ORDER — HEPARIN (PORCINE) IN NACL 1000-0.9 UT/500ML-% IV SOLN
INTRAVENOUS | Status: AC
  Filled 2018-07-24: qty 1000

## 2018-07-24 MED ORDER — CHLORHEXIDINE GLUCONATE CLOTH 2 % EX PADS
6.0000 | MEDICATED_PAD | Freq: Every day | CUTANEOUS | Status: DC
  Administered 2018-07-25: 6 via TOPICAL

## 2018-07-24 MED ORDER — MORPHINE SULFATE (PF) 10 MG/ML IV SOLN: 2.0000 mg | INTRAVENOUS | Status: DC | PRN

## 2018-07-24 MED ORDER — ASPIRIN 81 MG PO CHEW: 81.0000 mg | CHEWABLE_TABLET | ORAL | Status: DC

## 2018-07-24 MED ORDER — HEPARIN (PORCINE) IN NACL 100-0.45 UNIT/ML-% IJ SOLN
1450.0000 [IU]/h | INTRAMUSCULAR | Status: DC
  Administered 2018-07-24: 23:00:00 1450 [IU]/h via INTRAVENOUS
  Filled 2018-07-24: qty 250

## 2018-07-24 MED ORDER — SODIUM CHLORIDE 0.9 % IV SOLN
INTRAVENOUS | Status: AC | PRN
  Administered 2018-07-24: 50 mL/h via INTRAVENOUS

## 2018-07-24 MED ORDER — SERTRALINE HCL 50 MG PO TABS
50.0000 mg | ORAL_TABLET | Freq: Every day | ORAL | Status: DC
  Administered 2018-07-24 – 2018-07-25 (×3): 50 mg via ORAL
  Filled 2018-07-24 (×3): qty 1

## 2018-07-24 MED ORDER — LIDOCAINE HCL (PF) 1 % IJ SOLN
INTRAMUSCULAR | Status: AC
  Filled 2018-07-24: qty 30

## 2018-07-24 MED ORDER — SODIUM CHLORIDE 0.9% FLUSH: 3.0000 mL | Freq: Two times a day (BID) | INTRAVENOUS | Status: DC

## 2018-07-24 MED ORDER — SODIUM CHLORIDE 0.9 % IV SOLN: INTRAVENOUS | Status: DC

## 2018-07-24 SURGICAL SUPPLY — 12 items
CATH INFINITI 5F JL4 125CM (CATHETERS) ×1 IMPLANT
CATH INFINITI 5F PIG 125CM (CATHETERS) ×1 IMPLANT
CATH INFINITI 5FR AL1 (CATHETERS) ×1 IMPLANT
CATH INFINITI 5FR JR4 125CM (CATHETERS) ×1 IMPLANT
HOVERMATT SINGLE USE (MISCELLANEOUS) ×1 IMPLANT
KIT HEART LEFT (KITS) ×2 IMPLANT
PACK CARDIAC CATHETERIZATION (CUSTOM PROCEDURE TRAY) ×2 IMPLANT
SHEATH AVANTI 5FR 23CM (SHEATH) ×1 IMPLANT
SHEATH PINNACLE 5F 10CM (SHEATH) ×1 IMPLANT
TRANSDUCER W/STOPCOCK (MISCELLANEOUS) ×2 IMPLANT
TUBING CIL FLEX 10 FLL-RA (TUBING) ×2 IMPLANT
WIRE EMERALD 3MM-J .035X150CM (WIRE) ×1 IMPLANT

## 2018-07-24 NOTE — Interval H&P Note (Signed)
Cath Lab Visit (complete for each Cath Lab visit)  Clinical Evaluation Leading to the Procedure:   ACS: Yes.    Non-ACS:    Anginal Classification: CCS III  Anti-ischemic medical therapy: Maximal Therapy (2 or more classes of medications)  Non-Invasive Test Results: No non-invasive testing performed  Prior CABG: Previous CABG      History and Physical Interval Note:  07/24/2018 2:43 PM  [name]  has presented today for surgery, with the diagnosis of cp  The various methods of treatment have been discussed with the patient and family. After consideration of risks, benefits and other options for treatment, the patient has consented to  Procedure(s): LEFT HEART CATH AND CORS/GRAFTS ANGIOGRAPHY (N/A) as a surgical intervention .  The patient's history has been reviewed, patient examined, no change in status, stable for surgery.  I have reviewed the patient's chart and labs.  Questions were answered to the patient's satisfaction.     Nanetta BattyJonathan Berry

## 2018-07-24 NOTE — H&P (View-Only) (Signed)
Progress Note  Patient Name: [name] Date of Encounter: 07/24/2018  Primary Cardiologist: Thurmon Fair, MD   Subjective   Feeling well. No chest pain, sob or palpitations.   Inpatient Medications    Scheduled Meds: . allopurinol  100 mg Oral Daily  . amiodarone  200 mg Oral Daily  . [START ON 07/25/2018] aspirin  81 mg Oral Pre-Cath  . aspirin EC  81 mg Oral Daily  . atorvastatin  80 mg Oral Daily  . canagliflozin  100 mg Oral Daily  . cholecalciferol  1,000 Units Oral QODAY  . clopidogrel  75 mg Oral Daily  . insulin aspart  32 Units Subcutaneous Q supper  . insulin glargine  50 Units Subcutaneous q morning - 10a  . metoprolol succinate  50 mg Oral Daily  .  morphine injection  4 mg Intravenous Once  . omega-3 acid ethyl esters  1,000 mg Oral Daily  . pneumococcal 23 valent vaccine  0.5 mL Intramuscular Tomorrow-1000  . sacubitril-valsartan  1 tablet Oral BID  . sertraline  50 mg Oral QHS  . sodium chloride flush  3 mL Intravenous Q12H   Continuous Infusions: . sodium chloride    . [START ON 07/25/2018] sodium chloride    . heparin 1,450 Units/hr (07/24/18 0500)  . nitroGLYCERIN 30 mcg/min (07/24/18 0500)   PRN Meds: sodium chloride, acetaminophen, nitroGLYCERIN, ondansetron (ZOFRAN) IV, sodium chloride flush   Vital Signs    Vitals:   07/23/18 2016 07/23/18 2352 07/24/18 0500 07/24/18 0805  BP: 113/62 123/61 131/75 (!) 130/112  Pulse: 64 62 (!) 59 67  Resp: (!) 22 (!) 24 (!) 26 (!) 22  Temp: (!) 97.3 F (36.3 C) (!) 97.5 F (36.4 C) 98.1 F (36.7 C) (!) 97.4 F (36.3 C)  TempSrc: Oral Oral Axillary Oral  SpO2: 100% 100% 100% 98%  Weight:   (!) 422 lb 14.4 oz (191.8 kg)   Height:        Intake/Output Summary (Last 24 hours) at 07/24/2018 0813 Last data filed at 07/24/2018 0500 Gross per 24 hour  Intake 1719.68 ml  Output 650 ml  Net 1069.68 ml   Filed Weights   07/22/18 0425 07/23/18 0520 07/24/18 0500  Weight: (!) 412 lb (186.9 kg) (!) 419 lb  1.6 oz (190.1 kg) (!) 422 lb 14.4 oz (191.8 kg)    Telemetry    A paced  - Personally Reviewed  ECG    A paced with inferior lateral ST changes - Personally Reviewed  Physical Exam   GEN: Obese male no acute distress.   Neck: No JVD Cardiac: RRR, no murmurs, rubs, or gallops.  Respiratory: Clear to auscultation bilaterally. GI: Soft, nontender, non-distended  MS: No edema; No deformity. Neuro:  Nonfocal  Psych: Normal affect   Labs    Chemistry Recent Labs  Lab 07/21/18 1328 07/21/18 1343 07/21/18 2016 07/22/18 0311  NA 141 140  --  142  K 4.2 5.1  --  3.6  CL 105 104  --  106  CO2 24  --   --  28  GLUCOSE 146* 143*  --  107*  BUN 13 19  --  16  CREATININE 1.61* 1.60* 1.56* 1.64*  CALCIUM 8.9  --   --  8.5*  GFRNONAA 42*  --  43* 41*  GFRAA 48*  --  50* 47*  ANIONGAP 12  --   --  8    Hematology Recent Labs  Lab 07/22/18 0311 07/23/18 0421  07/24/18 0500  WBC 10.5 10.6* 9.3  RBC 4.60 4.52 4.57  HGB 10.0* 9.5* 9.8*  HCT 34.8* 34.3* 34.3*  MCV 75.7* 75.9* 75.1*  MCH 21.7* 21.0* 21.4*  MCHC 28.7* 27.7* 28.6*  RDW 18.6* 18.7* 18.8*  PLT 257 218 212    Cardiac Enzymes Recent Labs  Lab 07/21/18 1531 07/21/18 2016 07/22/18 0311 07/22/18 0723  TROPONINI <0.03 0.60* 4.53* 6.09*    Recent Labs  Lab 07/21/18 1341  TROPIPOC 0.01     BNP Recent Labs  Lab 07/21/18 1630  BNP 463.8*    Radiology    No results found.  Cardiac Studies   Pending cath  Patient Profile     72 y.o. male with a history of severe CAD and cardiomyopathy, combined systolic and diastolic HF, s/p ICD, Sustained ventricular tachycardia, PAF, morbid obesity who admitted with unstable angina/NSTEMI.   Assessment & Plan    1. NSTEMI - Troponin peaked at 6.09. Continue ASA, Plavix, IV nitro, Heparin, BB and statin. Cath today>> asking if we can do radially.   2. PAF - Eliquis on hold for cath. Continue amiodarone and BB.   3. CKD stage III - Scr stable at 1.6.    4. HLD - No results found for requested labs within last 8760 hours.  - Continue statin  5. Chronic combined CHF - Continue BB and entresto. Euvolemic.   For questions or updates, please contact [facility] Please consult [email or web address] for contact info under Cardiology/STEMI.     Signed, Manson PasseyBhavinkumar Bhagat, PA  07/24/2018, 8:13 AM     The patient was seen, examined and discussed with Bhagat,Bhavinkumar PA-C and I agree with the above.   Awaiting cath today, peak troponin 6.06, Crea 1.64 - his baseline, on iv fluids prior to cath, Eliquis on hold, on Heparin drip, asymptomatic today. Vitals are stable.  Tobias AlexanderKatarina Kerryann Allaire, MD 07/24/2018

## 2018-07-24 NOTE — Plan of Care (Signed)

## 2018-07-24 NOTE — Progress Notes (Signed)
ANTICOAGULATION CONSULT NOTE   Pharmacy Consult for Heparin Indication: atrial fibrillation and multivessel CAD  Allergies  Allergen Reactions  . Coreg [Carvedilol] Nausea And Vomiting    Patient Measurements: Height: $RemoveBef oreDEID_YRMpHAULsorWPIYLzsuDNgnZbErHhyHb$6\' 7"IBW/kg (Calculated) : 93.7  HEPARIN DW (KG): 138.2  Vital Signs: Temp: 98.1 F (36.7 C) (08/05 1145) Temp Source: Oral (08/05 1145) BP: 128/62 (08/05 1630) Pulse Rate: 59 (08/05 1630)  Labs: Recent Labs    07/21/18 2016  07/22/18 0311 07/22/18 0723 07/22/18 1542 07/23/18 0421 07/24/18 0500  HGB 11.1*  --  10.0*  --   --  9.5* 9.8*  HCT 39.7  --  34.8*  --   --  34.3* 34.3*  PLT 231  --  257  --   --  218 212  APTT  --    < >  --  62* 73* 69* 85*  HEPARINUNFRC  --    < >  --  0.81* 0.75* 0.68 0.63  CREATININE 1.56*  --  1.64*  --   --   --   --   TROPONINI 0.60*  --  4.53* 6.09*  --   --   --    < > = values in this interval not displayed.    Estimated Creatinine Clearance: 78.8 mL/min (A) (by C-G formula based on SCr of 1.64 mg/dL (H)).  Medications:  Infusions:  . sodium chloride    . sodium chloride    . heparin    . nitroGLYCERIN 30 mcg/min (07/24/18 0500)    Assessment: 72 yom presented to the ED as a Code STEMI and started on IV heparin. Pt is on apixaban PTA for history of afib. He reports his last dose was 8/1 at ~9pm.   He is s/p cath and found to have multivessel CAD. Pharmacy consulted to resume IV heparin 6 hrs after sheath removed with no bolus. Sheath out at 16:30. No bleeding noted.  Last heparin level and aPTT were therapeutic prior to cath and appeared to correlate.   Goal of Therapy:  Heparin level 0.3-0.7 units/ml Monitor platelets by anticoagulation protocol: Yes   Plan:  Resume heparin drip at 1450 units/hr at 22:30 8 hr heparin level Daily heparin level and CBC Monitor for s/sx of bleeding   Loura BackJennifer Petersburg, PharmD, BCPS Clinical Pharmacist Clinical phone  for 07/24/2018 until 10p is [phone] Please check AMION for all Pharmacist numbers by unit 07/24/2018 5:10 PM

## 2018-07-24 NOTE — Progress Notes (Signed)
ICM remote transmission rescheduled to 08/09/2018 due to patient is currently hospitalized.

## 2018-07-24 NOTE — Progress Notes (Addendum)
 Progress Note  Patient Name: [name] Date of Encounter: 07/24/2018  Primary Cardiologist: Mihai Croitoru, MD   Subjective   Feeling well. No chest pain, sob or palpitations.   Inpatient Medications    Scheduled Meds: . allopurinol  100 mg Oral Daily  . amiodarone  200 mg Oral Daily  . [START ON 07/25/2018] aspirin  81 mg Oral Pre-Cath  . aspirin EC  81 mg Oral Daily  . atorvastatin  80 mg Oral Daily  . canagliflozin  100 mg Oral Daily  . cholecalciferol  1,000 Units Oral QODAY  . clopidogrel  75 mg Oral Daily  . insulin aspart  32 Units Subcutaneous Q supper  . insulin glargine  50 Units Subcutaneous q morning - 10a  . metoprolol succinate  50 mg Oral Daily  .  morphine injection  4 mg Intravenous Once  . omega-3 acid ethyl esters  1,000 mg Oral Daily  . pneumococcal 23 valent vaccine  0.5 mL Intramuscular Tomorrow-1000  . sacubitril-valsartan  1 tablet Oral BID  . sertraline  50 mg Oral QHS  . sodium chloride flush  3 mL Intravenous Q12H   Continuous Infusions: . sodium chloride    . [START ON 07/25/2018] sodium chloride    . heparin 1,450 Units/hr (07/24/18 0500)  . nitroGLYCERIN 30 mcg/min (07/24/18 0500)   PRN Meds: sodium chloride, acetaminophen, nitroGLYCERIN, ondansetron (ZOFRAN) IV, sodium chloride flush   Vital Signs    Vitals:   07/23/18 2016 07/23/18 2352 07/24/18 0500 07/24/18 0805  BP: 113/62 123/61 131/75 (!) 130/112  Pulse: 64 62 (!) 59 67  Resp: (!) 22 (!) 24 (!) 26 (!) 22  Temp: (!) 97.3 F (36.3 C) (!) 97.5 F (36.4 C) 98.1 F (36.7 C) (!) 97.4 F (36.3 C)  TempSrc: Oral Oral Axillary Oral  SpO2: 100% 100% 100% 98%  Weight:   (!) 422 lb 14.4 oz (191.8 kg)   Height:        Intake/Output Summary (Last 24 hours) at 07/24/2018 0813 Last data filed at 07/24/2018 0500 Gross per 24 hour  Intake 1719.68 ml  Output 650 ml  Net 1069.68 ml   Filed Weights   07/22/18 0425 07/23/18 0520 07/24/18 0500  Weight: (!) 412 lb (186.9 kg) (!) 419 lb  1.6 oz (190.1 kg) (!) 422 lb 14.4 oz (191.8 kg)    Telemetry    A paced  - Personally Reviewed  ECG    A paced with inferior lateral ST changes - Personally Reviewed  Physical Exam   GEN: Obese male no acute distress.   Neck: No JVD Cardiac: RRR, no murmurs, rubs, or gallops.  Respiratory: Clear to auscultation bilaterally. GI: Soft, nontender, non-distended  MS: No edema; No deformity. Neuro:  Nonfocal  Psych: Normal affect   Labs    Chemistry Recent Labs  Lab 07/21/18 1328 07/21/18 1343 07/21/18 2016 07/22/18 0311  NA 141 140  --  142  K 4.2 5.1  --  3.6  CL 105 104  --  106  CO2 24  --   --  28  GLUCOSE 146* 143*  --  107*  BUN 13 19  --  16  CREATININE 1.61* 1.60* 1.56* 1.64*  CALCIUM 8.9  --   --  8.5*  GFRNONAA 42*  --  43* 41*  GFRAA 48*  --  50* 47*  ANIONGAP 12  --   --  8    Hematology Recent Labs  Lab 07/22/18 0311 07/23/18 0421   07/24/18 0500  WBC 10.5 10.6* 9.3  RBC 4.60 4.52 4.57  HGB 10.0* 9.5* 9.8*  HCT 34.8* 34.3* 34.3*  MCV 75.7* 75.9* 75.1*  MCH 21.7* 21.0* 21.4*  MCHC 28.7* 27.7* 28.6*  RDW 18.6* 18.7* 18.8*  PLT 257 218 212    Cardiac Enzymes Recent Labs  Lab 07/21/18 1531 07/21/18 2016 07/22/18 0311 07/22/18 0723  TROPONINI <0.03 0.60* 4.53* 6.09*    Recent Labs  Lab 07/21/18 1341  TROPIPOC 0.01     BNP Recent Labs  Lab 07/21/18 1630  BNP 463.8*    Radiology    No results found.  Cardiac Studies   Pending cath  Patient Profile     72 y.o. male with a history of severe CAD and cardiomyopathy, combined systolic and diastolic HF, s/p ICD, Sustained ventricular tachycardia, PAF, morbid obesity who admitted with unstable angina/NSTEMI.   Assessment & Plan    1. NSTEMI - Troponin peaked at 6.09. Continue ASA, Plavix, IV nitro, Heparin, BB and statin. Cath today>> asking if we can do radially.   2. PAF - Eliquis on hold for cath. Continue amiodarone and BB.   3. CKD stage III - Scr stable at 1.6.    4. HLD - No results found for requested labs within last 8760 hours.  - Continue statin  5. Chronic combined CHF - Continue BB and entresto. Euvolemic.   For questions or updates, please contact [facility] Please consult [email or web address] for contact info under Cardiology/STEMI.     Signed, Bhavinkumar Bhagat, PA  07/24/2018, 8:13 AM     The patient was seen, examined and discussed with Bhagat,Bhavinkumar PA-C and I agree with the above.   Awaiting cath today, peak troponin 6.06, Crea 1.64 - his baseline, on iv fluids prior to cath, Eliquis on hold, on Heparin drip, asymptomatic today. Vitals are stable.  Ranisha Allaire, MD 07/24/2018   

## 2018-07-24 NOTE — Care Management Note (Addendum)
Case Management Note  Patient Details  Name: [name] MRN: [id number] Date of Birth: 05-08-1946  Subjective/Objective:   From home, has history of severe CAD and cardiomyopathy, combined systolic and diastolic HF, s/p ICD, Sustained ventricular tachycardia, PAF, morbid obesity who admitted with unstable angina/NSTEMI, for cath.  On eliquis pta                  Action/Plan: NCM will follow for transition of care needs.  Expected Discharge Date:                  Expected Discharge Plan:  Home/Self Care  In-House Referral:     Discharge planning Services  CM Consult  Post Acute Care Choice:    Choice offered to:     DME Arranged:    DME Agency:     HH Arranged:    HH Agency:     Status of Service:  In process, will continue to follow  If discussed at Long Length of Stay Meetings, dates discussed:    Additional Comments:  [name], Jalaiya Oyster Clinton, RN 07/24/2018, 9:39 AM

## 2018-07-24 NOTE — Progress Notes (Signed)
ANTICOAGULATION CONSULT NOTE   Pharmacy Consult for Heparin Indication: chest pain/ACS and atrial fibrillation  Allergies  Allergen Reactions  . Coreg [Carvedilol] Nausea And Vomiting    Patient Measurements: Height: 6\' 7"  (200.7 cm) Weight: (!) 422 lb 14.4 oz (191.8 kg) IBW/kg (Calculated) : 93.7  HEPARIN DW (KG): 138.2  Vital Signs: Temp: 97.4 F (36.3 C) (08/05 0805) Temp Source: Oral (08/05 0805) BP: 130/112 (08/05 0805) Pulse Rate: 67 (08/05 0805)  Labs: Recent Labs    07/21/18 1343  07/21/18 2016  07/22/18 0311 07/22/18 0723 07/22/18 1542 07/23/18 0421 07/24/18 0500  HGB 14.3  --  11.1*  --  10.0*  --   --  9.5* 9.8*  HCT 42.0  --  39.7  --  34.8*  --   --  34.3* 34.3*  PLT  --   --  231  --  257  --   --  218 212  APTT  --   --   --    < >  --  62* 73* 69* 85*  HEPARINUNFRC  --   --   --    < >  --  0.81* 0.75* 0.68 0.63  CREATININE 1.60*  --  1.56*  --  1.64*  --   --   --   --   TROPONINI  --    < > 0.60*  --  4.53* 6.09*  --   --   --    < > = values in this interval not displayed.    Estimated Creatinine Clearance: 78.8 mL/min (A) (by C-G formula based on SCr of 1.64 mg/dL (H)).   Medical History: Past Medical History:  Diagnosis Date  . CAD (coronary artery disease)    a. CABG 1995 and multiple stents. b. Known graft occlusions - NSTEMI 06/2016 s/p DES to distal SVG-diag.  . Chronic systolic CHF (congestive heart failure) (HCC)   . CKD (chronic kidney disease), stage III (HCC)   . Dietary noncompliance   . DM (diabetes mellitus) (HCC)   . DM type 2 causing CKD stage 3 (HCC)   . Dyslipidemia - low HDL and high triglycerides   . Gout   . Ischemic cardiomyopathy    a. LVEF 10-15% by cath 06/2016.  Marland Kitchen. Leukocytosis   . Morbid obesity (HCC)   . Myocardial infarction (HCC)   . NSTEMI (non-ST elevated myocardial infarction) (HCC) 06/26/2016  . PAF (paroxysmal atrial fibrillation) (HCC)   . PONV (postoperative nausea and vomiting)   . Systemic  hypertension   . Unstable angina (HCC) 07/21/2018    Medications:  Infusions:  . sodium chloride    . [START ON 07/25/2018] sodium chloride    . heparin 1,450 Units/hr (07/24/18 0500)  . nitroGLYCERIN 30 mcg/min (07/24/18 0500)    Assessment: 72 yom presented to the ED as a Code STEMI. To start IV heparin. Pt is on apixaban PTA for history of afib. He reports his last dose was 8/1 at ~9pm. Baseline CBC with Hgb 14.3 and platelets 316. Cardiac cath planned for 8/5  Heparin level and CBC stable   Goal of Therapy:  Heparin level 0.3-0.7 units/ml aPTT 66-103 seconds Monitor platelets by anticoagulation protocol: Yes   Plan:  Continue heparin drip at 1450 units/hr Follow up after cath  Thank you Okey RegalLisa Whitni Pasquini, PharmD [phone]   07/24/2018    9:22 AM

## 2018-07-24 NOTE — Progress Notes (Signed)
Site area: Right groin a 5 french arterial sheath removed  Site Prior to Removal:  Level 0  Pressure Applied For 30 MINUTES    Bedrest Beginning at 1630p  Manual:   Yes.    Patient Status During Pull:  stable  Post Pull Groin Site:  Level 0  Post Pull Instructions Given:  Yes.    Post Pull Pulses Present:  Yes.    Dressing Applied:  Yes.    Comments:  VS remain stable

## 2018-07-25 ENCOUNTER — Encounter (HOSPITAL_COMMUNITY): Payer: Self-pay | Admitting: Cardiovascular Disease

## 2018-07-25 ENCOUNTER — Encounter (HOSPITAL_COMMUNITY)
Admission: EM | Disposition: A | Discharge status: Home Health | Payer: Self-pay | Source: Home / Self Care | Attending: Cardiology

## 2018-07-25 DIAGNOSIS — I25810 Atherosclerosis of coronary artery bypass graft(s) without angina pectoris: Secondary | ICD-10-CM

## 2018-07-25 HISTORY — PX: CORONARY STENT INTERVENTION: CATH118234

## 2018-07-25 LAB — CBC
HEMATOCRIT: 34.3 % — AB (ref 39.0–52.0)
Hemoglobin: 9.5 g/dL — ABNORMAL LOW (ref 13.0–17.0)
MCH: 21.1 pg — ABNORMAL LOW (ref 26.0–34.0)
MCHC: 27.7 g/dL — AB (ref 30.0–36.0)
MCV: 76.1 fL — ABNORMAL LOW (ref 78.0–100.0)
PLATELETS: 209 10*3/uL (ref 150–400)
RBC: 4.51 MIL/uL (ref 4.22–5.81)
RDW: 18.9 % — AB (ref 11.5–15.5)
WBC: 10.1 10*3/uL (ref 4.0–10.5)

## 2018-07-25 LAB — POCT ACTIVATED CLOTTING TIME
Activated Clotting Time: 290 seconds
Activated Clotting Time: 340 seconds

## 2018-07-25 LAB — BASIC METABOLIC PANEL
Anion gap: 10 (ref 5–15)
BUN: 19 mg/dL (ref 8–23)
CALCIUM: 8.6 mg/dL — AB (ref 8.9–10.3)
CHLORIDE: 104 mmol/L (ref 98–111)
CO2: 27 mmol/L (ref 22–32)
Creatinine, Ser: 1.58 mg/dL — ABNORMAL HIGH (ref 0.61–1.24)
GFR calc Af Amer: 49 mL/min — ABNORMAL LOW (ref >60)
GFR calc non Af Amer: 43 mL/min — ABNORMAL LOW (ref >60)
GLUCOSE: 97 mg/dL (ref 70–99)
Potassium: 3.6 mmol/L (ref 3.5–5.1)
Sodium: 141 mmol/L (ref 135–145)

## 2018-07-25 LAB — GLUCOSE, CAPILLARY
GLUCOSE-CAPILLARY: 97 mg/dL (ref 70–99)
Glucose-Capillary: 86 mg/dL (ref 70–99)
Glucose-Capillary: 179 mg/dL — ABNORMAL HIGH (ref 70–99)
Glucose-Capillary: 172 mg/dL — ABNORMAL HIGH (ref 70–99)

## 2018-07-25 LAB — HEPARIN LEVEL (UNFRACTIONATED): Heparin Unfractionated: 0.36 IU/mL (ref 0.30–0.70)

## 2018-07-25 SURGERY — CORONARY STENT INTERVENTION
Anesthesia: LOCAL

## 2018-07-25 MED ORDER — NITROGLYCERIN 1 MG/10 ML FOR IR/CATH LAB
INTRA_ARTERIAL | Status: DC | PRN
  Administered 2018-07-25: 200 ug via INTRACORONARY

## 2018-07-25 MED ORDER — FENTANYL CITRATE (PF) 100 MCG/2ML IJ SOLN
INTRAMUSCULAR | Status: AC
  Filled 2018-07-25: qty 2

## 2018-07-25 MED ORDER — SODIUM CHLORIDE 0.9% FLUSH
3.0000 mL | Freq: Two times a day (BID) | INTRAVENOUS | Status: DC
  Administered 2018-07-25: 19:00:00 3 mL via INTRAVENOUS

## 2018-07-25 MED ORDER — ACETAMINOPHEN 325 MG PO TABS
650.0000 mg | ORAL_TABLET | ORAL | Status: DC | PRN
  Administered 2018-07-25: 19:00:00 650 mg via ORAL
  Filled 2018-07-25: qty 2

## 2018-07-25 MED ORDER — TICAGRELOR 90 MG PO TABS
ORAL_TABLET | ORAL | Status: AC
  Filled 2018-07-25: qty 2

## 2018-07-25 MED ORDER — HEPARIN SODIUM (PORCINE) 1000 UNIT/ML IJ SOLN
INTRAMUSCULAR | Status: AC
  Filled 2018-07-25: qty 2

## 2018-07-25 MED ORDER — SODIUM CHLORIDE 0.9% FLUSH: 3.0000 mL | INTRAVENOUS | Status: DC | PRN

## 2018-07-25 MED ORDER — HYDRALAZINE HCL 20 MG/ML IJ SOLN: 5.0000 mg | INTRAMUSCULAR | Status: AC | PRN

## 2018-07-25 MED ORDER — HEPARIN SODIUM (PORCINE) 5000 UNIT/ML IJ SOLN
5000.0000 [IU] | Freq: Three times a day (TID) | INTRAMUSCULAR | Status: DC
  Administered 2018-07-26: 5000 [IU] via SUBCUTANEOUS
  Filled 2018-07-25: qty 1

## 2018-07-25 MED ORDER — MIDAZOLAM HCL 2 MG/2ML IJ SOLN
INTRAMUSCULAR | Status: AC
  Filled 2018-07-25: qty 2

## 2018-07-25 MED ORDER — ASPIRIN 81 MG PO CHEW
81.0000 mg | CHEWABLE_TABLET | Freq: Every day | ORAL | Status: DC
  Administered 2018-07-26: 81 mg via ORAL
  Filled 2018-07-25: qty 1

## 2018-07-25 MED ORDER — ONDANSETRON HCL 4 MG/2ML IJ SOLN: 4.0000 mg | Freq: Four times a day (QID) | INTRAMUSCULAR | Status: DC | PRN

## 2018-07-25 MED ORDER — SODIUM CHLORIDE 0.9 % IV SOLN: 250.0000 mL | INTRAVENOUS | Status: DC | PRN

## 2018-07-25 MED ORDER — VERAPAMIL HCL 2.5 MG/ML IV SOLN
INTRAVENOUS | Status: DC | PRN
  Administered 2018-07-25: 10 mL via INTRA_ARTERIAL

## 2018-07-25 MED ORDER — HEPARIN SODIUM (PORCINE) 1000 UNIT/ML IJ SOLN
INTRAMUSCULAR | Status: DC | PRN
  Administered 2018-07-25: 4000 [IU] via INTRAVENOUS
  Administered 2018-07-25: 15000 [IU] via INTRAVENOUS

## 2018-07-25 MED ORDER — SODIUM CHLORIDE 0.9 % IV SOLN
INTRAVENOUS | Status: DC
  Administered 2018-07-25: 07:00:00 via INTRAVENOUS

## 2018-07-25 MED ORDER — IOPAMIDOL (ISOVUE-370) INJECTION 76%
INTRAVENOUS | Status: DC | PRN
  Administered 2018-07-25: 70 mL via INTRA_ARTERIAL

## 2018-07-25 MED ORDER — TICAGRELOR 90 MG PO TABS
90.0000 mg | ORAL_TABLET | Freq: Two times a day (BID) | ORAL | Status: DC
  Administered 2018-07-25: 21:00:00 90 mg via ORAL
  Filled 2018-07-25: qty 1

## 2018-07-25 MED ORDER — LIDOCAINE HCL (PF) 1 % IJ SOLN
INTRAMUSCULAR | Status: AC
  Filled 2018-07-25: qty 30

## 2018-07-25 MED ORDER — SODIUM CHLORIDE 0.9 % IV SOLN
INTRAVENOUS | Status: AC
  Administered 2018-07-25: 12:00:00 via INTRAVENOUS

## 2018-07-25 MED ORDER — FENTANYL CITRATE (PF) 100 MCG/2ML IJ SOLN
INTRAMUSCULAR | Status: DC | PRN
  Administered 2018-07-25 (×2): 25 ug via INTRAVENOUS

## 2018-07-25 MED ORDER — ASPIRIN 81 MG PO CHEW
81.0000 mg | CHEWABLE_TABLET | ORAL | Status: AC
  Administered 2018-07-25: 81 mg via ORAL
  Filled 2018-07-25: qty 1

## 2018-07-25 MED ORDER — NITROGLYCERIN 1 MG/10 ML FOR IR/CATH LAB
INTRA_ARTERIAL | Status: AC
  Filled 2018-07-25: qty 10

## 2018-07-25 MED ORDER — LABETALOL HCL 5 MG/ML IV SOLN: 10.0000 mg | INTRAVENOUS | Status: AC | PRN

## 2018-07-25 MED ORDER — HEPARIN (PORCINE) IN NACL 1000-0.9 UT/500ML-% IV SOLN
INTRAVENOUS | Status: DC | PRN
  Administered 2018-07-25 (×2): 500 mL

## 2018-07-25 MED ORDER — TICAGRELOR 90 MG PO TABS
ORAL_TABLET | ORAL | Status: DC | PRN
  Administered 2018-07-25: 180 mg via ORAL

## 2018-07-25 MED ORDER — MIDAZOLAM HCL 2 MG/2ML IJ SOLN
INTRAMUSCULAR | Status: DC | PRN
  Administered 2018-07-25: 1 mg via INTRAVENOUS
  Administered 2018-07-25: 2 mg via INTRAVENOUS

## 2018-07-25 MED ORDER — LIDOCAINE HCL (PF) 1 % IJ SOLN
INTRAMUSCULAR | Status: DC | PRN
  Administered 2018-07-25: 3 mL

## 2018-07-25 MED ORDER — VERAPAMIL HCL 2.5 MG/ML IV SOLN
INTRAVENOUS | Status: AC
  Filled 2018-07-25: qty 2

## 2018-07-25 MED ORDER — SODIUM CHLORIDE 0.9% FLUSH
3.0000 mL | Freq: Two times a day (BID) | INTRAVENOUS | Status: DC
  Administered 2018-07-25: 3 mL via INTRAVENOUS

## 2018-07-25 MED ORDER — HEPARIN (PORCINE) IN NACL 1000-0.9 UT/500ML-% IV SOLN
INTRAVENOUS | Status: AC
  Filled 2018-07-25: qty 1000

## 2018-07-25 SURGICAL SUPPLY — 26 items
BAG SNAP BAND KOVER 36X36 (MISCELLANEOUS) ×2 IMPLANT
BALLN SAPPHIRE 2.0X12 (BALLOONS) ×2
BALLN SAPPHIRE 2.5X12 (BALLOONS) ×2
BALLN SAPPHIRE 2.5X20 (BALLOONS) ×2
BALLN SAPPHIRE ~~LOC~~ 2.75X15 (BALLOONS) ×1 IMPLANT
BALLOON SAPPHIRE 2.0X12 (BALLOONS) IMPLANT
BALLOON SAPPHIRE 2.5X12 (BALLOONS) IMPLANT
BALLOON SAPPHIRE 2.5X20 (BALLOONS) IMPLANT
CATH LAUNCHER 6FR AL1 (CATHETERS) IMPLANT
CATHETER LAUNCHER 6FR AL1 (CATHETERS) ×2
DEVICE RAD COMP TR BAND LRG (VASCULAR PRODUCTS) ×1 IMPLANT
ELECT DEFIB PAD ADLT CADENCE (PAD) ×1 IMPLANT
GLIDESHEATH SLEND SS 6F .021 (SHEATH) ×1 IMPLANT
GUIDEWIRE INQWIRE 1.5J.035X260 (WIRE) IMPLANT
HOVERMATT SINGLE USE (MISCELLANEOUS) ×1 IMPLANT
INQWIRE 1.5J .035X260CM (WIRE) ×2
KIT ENCORE 26 ADVANTAGE (KITS) ×1 IMPLANT
KIT HEART LEFT (KITS) ×2 IMPLANT
KIT HEMO VALVE WATCHDOG (MISCELLANEOUS) ×1 IMPLANT
PACK CARDIAC CATHETERIZATION (CUSTOM PROCEDURE TRAY) ×2 IMPLANT
SHIELD RADPAD SCOOP 12X17 (MISCELLANEOUS) ×1 IMPLANT
STENT ORSIRO 2.5X26 (Permanent Stent) ×1 IMPLANT
TRANSDUCER W/STOPCOCK (MISCELLANEOUS) ×2 IMPLANT
TUBING CIL FLEX 10 FLL-RA (TUBING) ×2 IMPLANT
WIRE ASAHI PROWATER 180CM (WIRE) ×1 IMPLANT
WIRE FIGHTER CROSSING 190CM (WIRE) ×1 IMPLANT

## 2018-07-25 NOTE — Progress Notes (Signed)
TR BAND REMOVAL  LOCATION:  left radial  DEFLATED PER PROTOCOL:  Yes.    TIME BAND OFF / DRESSING APPLIED:   1530   SITE UPON ARRIVAL:   Level 0  SITE AFTER BAND REMOVAL:  Level 1  CIRCULATION SENSATION AND MOVEMENT:  Within Normal Limits  Yes.    COMMENTS:

## 2018-07-25 NOTE — Progress Notes (Signed)
ANTICOAGULATION CONSULT NOTE   Pharmacy Consult for Heparin Indication: atrial fibrillation and multivessel CAD  Allergies  Allergen Reactions  . Coreg [Carvedilol] Nausea And Vomiting    Patient Measurements: Height: 6\' 7"  (200.7 cm) Weight: (!) 422 lb 14.4 oz (191.8 kg) IBW/kg (Calculated) : 93.7  HEPARIN DW (KG): 138.2  Vital Signs: Temp: 98.2 F (36.8 C) (08/06 0627) Temp Source: Oral (08/06 0627) BP: 128/55 (08/06 0627) Pulse Rate: 63 (08/06 0627)  Labs: Recent Labs    07/22/18 0723 07/22/18 1542 07/23/18 0421 07/24/18 0500 07/25/18 0548  HGB  --   --  9.5* 9.8*  --   HCT  --   --  34.3* 34.3*  --   PLT  --   --  218 212  --   APTT 62* 73* 69* 85*  --   HEPARINUNFRC 0.81* 0.75* 0.68 0.63 0.36  TROPONINI 6.09*  --   --   --   --     Estimated Creatinine Clearance: 78.8 mL/min (A) (by C-G formula based on SCr of 1.64 mg/dL (H)).  Medications:  Infusions:  . sodium chloride    . sodium chloride    . sodium chloride 10 mL/hr at 07/25/18 0641  . heparin 1,450 Units/hr (07/25/18 0300)  . nitroGLYCERIN 30 mcg/min (07/25/18 0300)    Assessment: 72 yom presented to the ED as a Code STEMI and started on IV heparin. Pt is on apixaban PTA for history of afib. He reports his last dose was 8/1 at ~9pm.   He is s/p cath 8/5 and found to have multivessel CAD. Pharmacy consulted to resume IV heparin 6 hrs after sheath removed with no bolus.  Heparin level remains therapeutic on resumption. CBC stable, still pending for today. No bleed documented.  Goal of Therapy:  Heparin level 0.3-0.7 units/ml Monitor platelets by anticoagulation protocol: Yes   Plan:  Continue heparin drip at 1450 units/hr Daily heparin level and CBC Monitor for s/sx of bleeding  Babs BertinHaley Basilia Stuckert, PharmD, BCPS Clinical Pharmacist 07/25/2018 6:52 AM

## 2018-07-25 NOTE — Interval H&P Note (Signed)
Cath Lab Visit (complete for each Cath Lab visit)  Clinical Evaluation Leading to the Procedure:   ACS: Yes.    Non-ACS:    Anginal Classification: CCS IV  Anti-ischemic medical therapy: Minimal Therapy (1 class of medications)  Non-Invasive Test Results: No non-invasive testing performed  Prior CABG: Previous CABG      History and Physical Interval Note:  07/25/2018 9:17 AM  [name]  has presented today for surgery, with the diagnosis of cad  The various methods of treatment have been discussed with the patient and family. After consideration of risks, benefits and other options for treatment, the patient has consented to  Procedure(s): CORONARY STENT INTERVENTION (N/A) as a surgical intervention .  The patient's history has been reviewed, patient examined, no change in status, stable for surgery.  I have reviewed the patient's chart and labs.  Questions were answered to the patient's satisfaction.     Lance MussJayadeep Demani Mcbrien

## 2018-07-26 ENCOUNTER — Telehealth: Payer: Self-pay | Admitting: Cardiology

## 2018-07-26 ENCOUNTER — Other Ambulatory Visit: Payer: Self-pay | Admitting: Medical

## 2018-07-26 DIAGNOSIS — N183 Chronic kidney disease, stage 3 unspecified: Secondary | ICD-10-CM

## 2018-07-26 LAB — BASIC METABOLIC PANEL
ANION GAP: 9 (ref 5–15)
BUN: 15 mg/dL (ref 8–23)
CHLORIDE: 105 mmol/L (ref 98–111)
CO2: 25 mmol/L (ref 22–32)
Calcium: 8.6 mg/dL — ABNORMAL LOW (ref 8.9–10.3)
Creatinine, Ser: 1.47 mg/dL — ABNORMAL HIGH (ref 0.61–1.24)
GFR calc Af Amer: 54 mL/min — ABNORMAL LOW (ref >60)
GFR calc non Af Amer: 47 mL/min — ABNORMAL LOW (ref >60)
GLUCOSE: 189 mg/dL — AB (ref 70–99)
Potassium: 3.7 mmol/L (ref 3.5–5.1)
Sodium: 139 mmol/L (ref 135–145)

## 2018-07-26 LAB — CBC
HEMATOCRIT: 32.7 % — AB (ref 39.0–52.0)
HEMOGLOBIN: 9.1 g/dL — AB (ref 13.0–17.0)
MCH: 21 pg — AB (ref 26.0–34.0)
MCHC: 27.8 g/dL — AB (ref 30.0–36.0)
MCV: 75.5 fL — AB (ref 78.0–100.0)
Platelets: 216 10*3/uL (ref 150–400)
RBC: 4.33 MIL/uL (ref 4.22–5.81)
RDW: 18.9 % — ABNORMAL HIGH (ref 11.5–15.5)
WBC: 7.6 10*3/uL (ref 4.0–10.5)

## 2018-07-26 LAB — GLUCOSE, CAPILLARY: GLUCOSE-CAPILLARY: 177 mg/dL — AB (ref 70–99)

## 2018-07-26 MED ORDER — NITROGLYCERIN 0.4 MG SL SUBL: SUBLINGUAL_TABLET | SUBLINGUAL | 3 refills | Status: DC

## 2018-07-26 MED ORDER — CLOPIDOGREL BISULFATE 75 MG PO TABS
75.0000 mg | ORAL_TABLET | Freq: Every day | ORAL | Status: DC
  Filled 2018-07-26: qty 1

## 2018-07-26 MED ORDER — CLOPIDOGREL BISULFATE 75 MG PO TABS
300.0000 mg | ORAL_TABLET | Freq: Once | ORAL | Status: AC
  Administered 2018-07-26: 11:00:00 300 mg via ORAL
  Filled 2018-07-26: qty 4

## 2018-07-26 MED ORDER — SACUBITRIL-VALSARTAN 49-51 MG PO TABS: 1.0000 | ORAL_TABLET | Freq: Two times a day (BID) | ORAL | 3 refills | Status: DC

## 2018-07-26 MED ORDER — SACUBITRIL-VALSARTAN 49-51 MG PO TABS
1.0000 | ORAL_TABLET | Freq: Two times a day (BID) | ORAL | Status: DC
  Filled 2018-07-26: qty 1

## 2018-07-26 MED ORDER — ASPIRIN 81 MG PO CHEW: 81.0000 mg | CHEWABLE_TABLET | Freq: Every day | ORAL | 3 refills | Status: DC

## 2018-07-26 NOTE — Telephone Encounter (Signed)
Current admit. Attempt TOC 8/8

## 2018-07-26 NOTE — Discharge Summary (Addendum)
Discharge Summary    Patient ID: [name],  MRN: [id number], DOB/AGE: [birth year] 72 y.o.  Admit date: 07/21/2018 Discharge date: 07/26/2018  Primary Care Provider: Devra DoppHowell, Tamieka Primary Cardiologist: Thurmon FairMihai Croitoru, MD  Discharge Diagnoses    Principal Problem:   Unstable angina [facility](HCC) Active Problems:   Non-STEMI (non-ST elevated myocardial infarction) (HCC)   CKD (chronic kidney disease), stage III (HCC)   Acute on chronic combined systolic (congestive) and diastolic (congestive) heart failure (HCC)   Precordial chest pain   Allergies Allergies  Allergen Reactions  . Coreg [Carvedilol] Nausea And Vomiting    Diagnostic Studies/Procedures    Left heart catheterization 07/24/18:  Prox RCA lesion is 100% stenosed.  Ost LAD to Prox LAD lesion is 100% stenosed.  Ost Cx to Prox Cx lesion is 100% stenosed.  Origin lesion is 100% stenosed.  Origin to Prox Graft lesion is 100% stenosed.  Mid Graft to Insertion lesion is 95% stenosed.  Mid LAD to Dist LAD lesion is 100% stenosed.  IMPRESSION: [name] has a 95% "in-stent restenosis within the distal stented segment of the diagonal branch vein graft.  This is only patent vessel.  The LIMA is patent but the LAD beyond LIMA insertion is occluded.  I am uncomfortable performing intervention from the femoral approach given how deep the femoral artery is in the possibility of bleeding.  I suspect he should undergo vein graft intervention via the left radial approach.  The sheath was removed and pressure will be held in the groin to achieve hemostasis.  I would restart heparin without bolus in 6 hours and will review the intra-grams of my colleagues.  The patient did receive 90 cc of contrast, his serum creatinine was 1.6.  Coronary stent intervention 07/25/18:  Dist Graft to Insertion lesion is 80% stenosed.  A drug-eluting stent was successfully placed using a STENT ORSIRO [id number].   Recommend to resume Apixaban, at  currently prescribed dose and frequency, on 07/26/18.  Recommend concurrent antiplatelet therapy of Aspirin 81mg  daily for 1 month and Clopidogrel 75mg  daily for 12 months.   Brilinta was given preprocedure since he was not getting his Plavix in the hospital.  Would stop Brilinta and reload with clopidogrel 300 mg tomorrow.   _____________   History of Present Illness     72 y.o. male with a history of severe CAD and cardiomyopathy, combined systolic and diastolic HF, s/p ICD, Sustained ventricular tachycardia, PAF, morbid obesity  [name] developed chest pain about 11 am on 07/21/18. He described the pain as going across his chest. It was 10/10, associated w/ nausea, dry heaves, diaphoresis.  He took a sublingual nitroglycerin.  When it did not resolve, he came to emergency room.   In the ER, his ECG is abnormal and initially a code STEMI was called.  Dr. Okey DupreEnd evaluated the patient.  He continued to have chest pain at a 6/10.  However, he is also short of breath and has some CHF on his chest x-ray.  His ECG, although abnormal, is only mildly different from his previous ECGs and therefore does not meet STEMI criteria.  His symptoms improved on medical therapy.    Hospital Course     Consultants: None   1. NSTEMI in patient with severe CAD s/p CABG: patient presented with chest pain and SOB. Initially code STEMI activated, however canceled given overall no significant change on EKG and hope to avoid cath given apixaban use. Troponin peaked at 6.09. LHC occurred  07/24/18 which revealed 95% in-stent restenosis within the distal stented segment of the diagonal branch vein graft which is the only patent vessel and was felt would best be managed with intervention via the left radial approach. Patient underwent coronary stent intervention 07/25/18 with successful PCI/DES. He was recommended for triple therapy with ASA, plavix, and apixaban x1 month, then plavix and apixaban for at least 12 months.  -  Continue ASA x 1 month - Continue plavix and apixaban for 12 months - Continue statin  2. Chronic combined CHF: Ef 15-20% on last Echo 2017, s/p ICD. Patient presented with CP and SOB. BNP 463.8. CXR without overt edema.  - Home entresto increased at discharge - Continue metoprolol  3. Atrial fibrillation: paced rhythm on telemetry this admission - Continue amiodarone, metoprolol, and eliquis BID  4. HLD:  - Continue high intensity statin  5. CKD stage III: Cr 1.47 on the day of discharge - Continue to monitor routinely outpatient  6. DM type 2: maintained on ISS inpatient - Home insulin and po glycemic control medications continued at discharge.  _____________  Discharge Vitals Blood pressure (!) 141/68, pulse 62, temperature 98.2 F (36.8 C), temperature source Oral, resp. rate (!) 22, height 6\' 7"  (2.007 m), weight (!) 432 lb 1.6 oz (196 kg), SpO2 96 %.  Filed Weights   07/24/18 0500 07/25/18 0627 07/26/18 0400  Weight: (!) 422 lb 14.4 oz (191.8 kg) (!) 427 lb 11.1 oz (194 kg) (!) 432 lb 1.6 oz (196 kg)   Physical exam on the day of discharge:  ZOX:[id number] obese   Neck:No JVD, no carotid bruits Cardiac:  RRR, no murmurs, rubs, or gallops.  Respiratory:Clear to auscultation bilaterally, no wheezes/ rales/ rhonchi JW:JXBJ, Soft, nontender, non-distended  MS:No edema; No deformity. Neuro:Nonfocal, moving all extremities spontaneously Psych: Normal affect     Labs & Radiologic Studies    CBC Recent Labs    07/25/18 0548 07/26/18 0350  WBC 10.1 7.6  HGB 9.5* 9.1*  HCT 34.3* 32.7*  MCV 76.1* 75.5*  PLT 209 216   Basic Metabolic Panel Recent Labs    47/82/95 0548 07/26/18 0350  NA 141 139  K 3.6 3.7  CL 104 105  CO2 27 25  GLUCOSE 97 189*  BUN 19 15  CREATININE 1.58* 1.47*  CALCIUM 8.6* 8.6*   Liver Function Tests No results for input(s): AST, ALT, ALKPHOS, BILITOT, PROT, ALBUMIN in the last 72 hours. No results for input(s): LIPASE, AMYLASE  in the last 72 hours. Cardiac Enzymes No results for input(s): CKTOTAL, CKMB, CKMBINDEX, TROPONINI in the last 72 hours. BNP Invalid input(s): POCBNP D-Dimer No results for input(s): DDIMER in the last 72 hours. Hemoglobin A1C No results for input(s): HGBA1C in the last 72 hours. Fasting Lipid Panel No results for input(s): CHOL, HDL, LDLCALC, TRIG, CHOLHDL, LDLDIRECT in the last 72 hours. Thyroid Function Tests No results for input(s): TSH, T4TOTAL, T3FREE, THYROIDAB in the last 72 hours.  Invalid input(s): FREET3 _____________  Dg Chest Port 1 View  Result Date: 07/21/2018 CLINICAL DATA:  72 year old male with chest pain onset several hours ago. Shortness of breath and nausea. EXAM: PORTABLE CHEST 1 VIEW COMPARISON:  Chest radiographs 03/10/2017 and earlier. FINDINGS: Portable AP semi upright view at 1343 hours. Prior CABG. Stable cardiomegaly and mediastinal contours. Stable left chest cardiac AICD. The patient is rotated to the left. Mildly lower lung volumes. No pneumothorax, pulmonary edema, pleural effusion or consolidation. Paucity of bowel gas in the upper abdomen. Visualized  tracheal air column is within normal limits. IMPRESSION: No acute cardiopulmonary abnormality. Electronically Signed   By: Odessa Fleming M.D.   On: 07/21/2018 13:55   Disposition   Patient was seen and examined by Dr Delton See who deemed patient as stable for discharge. Follow-up has been arranged. Discharge medications as listed below.   Follow-up Plans & Appointments     Discharge Instructions    AMB Referral to Cardiac Rehabilitation - Phase II   Complete by:  As directed    Diagnosis:   Coronary Stents NSTEMI        Discharge Medications   Allergies as of 07/26/2018      Reactions   Coreg [carvedilol] Nausea And Vomiting      Medication List    STOP taking these medications   sacubitril-valsartan 24-26 MG Commonly known as:  ENTRESTO Replaced by:  sacubitril-valsartan 49-51 MG     TAKE these  medications   acetaminophen 325 MG tablet Commonly known as:  TYLENOL Take 1-2 tablets (325-650 mg total) by mouth every 4 (four) hours as needed for mild pain.   allopurinol 100 MG tablet Commonly known as:  ZYLOPRIM Take 100 mg by mouth daily.   amiodarone 200 MG tablet Commonly known as:  PACERONE Take 1 tablet (200 mg total) by mouth daily.   aspirin 81 MG chewable tablet Chew 1 tablet (81 mg total) by mouth daily.   atorvastatin 80 MG tablet Commonly known as:  LIPITOR take 1 tablet by mouth once daily   cholecalciferol 1000 units tablet Commonly known as:  VITAMIN D Take 1,000 Units by mouth every other day.   clopidogrel 75 MG tablet Commonly known as:  PLAVIX TAKE 1 TABLET BY MOUTH DAILY   ELIQUIS 5 MG Tabs tablet Generic drug:  apixaban take 1 tablet by mouth twice a day   Fish Oil 1200 MG Caps Take 1,200 mg by mouth daily.   furosemide 40 MG tablet Commonly known as:  LASIX Take 2 tablets (80 mg total) by mouth 2 (two) times daily. NEED OV. What changed:    when to take this  additional instructions   HUMALOG KWIKPEN 100 UNIT/ML KiwkPen Generic drug:  insulin lispro INJ 32 UNI [place] AT THE BEGINNING OF LARGEST MEAL   insulin glargine 100 UNIT/ML injection Commonly known as:  LANTUS Inject 0.5 mLs (50 Units total) into the skin every morning.   INVOKANA 100 MG Tabs tablet Generic drug:  canagliflozin Take 100 mg by mouth daily.   metoprolol succinate 50 MG 24 hr tablet Commonly known as:  TOPROL-XL Take 1 tablet (50 mg total) by mouth daily. NEED OV.   nitroGLYCERIN 0.4 MG SL tablet Commonly known as:  NITROSTAT place 1 tablet under the tongue if needed every 5 minutes for chest pain   ORTHOVISC 30 MG/2ML Sosy Generic drug:  Hyaluronan Inject 30 mg into the articular space daily.   sacubitril-valsartan 49-51 MG Commonly known as:  ENTRESTO Take 1 tablet by mouth 2 (two) times daily. Replaces:  sacubitril-valsartan 24-26 MG   sertraline 50  MG tablet Commonly known as:  ZOLOFT Take 1 tablet (50 mg total) by mouth daily.   VICTOZA 18 MG/3ML Sopn Generic drug:  liraglutide Inject 1.2 mg into the skin daily.            Durable Medical Equipment  (From admission, onward)        Start     Ordered   07/26/18 1111  For home use only DME Dan Humphreys  rolling  Once    Question:  Patient needs a walker to treat with the following condition  Answer:  Weakness   07/26/18 1110     Aspirin prescribed at discharge?  Yes High Intensity Statin Prescribed? (Lipitor 40-80mg  or Crestor 20-40mg ): Yes Beta Blocker Prescribed? Yes For EF <40%, was ACEI/ARB Prescribed? No: CKD limits ability to start  ADP Receptor Inhibitor Prescribed? (i.e. Plavix etc.-Includes Medically Managed Patients): Yes For EF <40%, Aldosterone Inhibitor Prescribed? No: CKD limits ability to start  Was EF assessed during THIS hospitalization? No: assessed prior to admission Was Cardiac Rehab II ordered? (Included Medically managed Patients): Yes   Outstanding Labs/Studies   None  Duration of Discharge Encounter   Greater than 30 minutes including physician time.  Signed, Beatriz Stallion PA-C 07/26/2018, 8:28 AM  The patient was seen, examined and discussed with Beatriz Stallion, PA-C  and I agree with the above.   The patient feels deconditioned and SOB with walking with cardiac rehabilitation personal. We will order a PT evaluation and arrange for a home PT based on their recommendations. Discharge home today. Follow up arranged.His BP is elevated, I will increase the dose of Entresto and repeat BMP at the next visit.  Tobias Alexander, MD 07/26/2018

## 2018-07-26 NOTE — Care Management Note (Addendum)
Case Management Note  Patient Details  Name: [name] MRN: [id number] Date of Birth: 06/07/1946  Subjective/Objective:  From home with wife, s/p coronary stent intervention, will be on plavix.  NCM received consult for HHRN, HHPT, and rolling walker.  NCM spoke with wife, [place],  she states [facility], [employer] [facility] [place], he states he will call me back shortly.  Denyse AmassCorey called back and stated they can [facility], [facility].                   [facility]Plan[facility].    Expected Discharge Date:  07/25/18               Expected Discharge Plan:  Home w Home Health Services  In-House Referral:     Discharge planning Services  CM Consult  Post Acute Care Choice:  Home Health Choice offered to:  Spouse  DME Arranged:  Walker rolling DME Agency:  [facility]  HH Arranged:  RN, Disease Management, PT [facility]  Status of Service:  Completed, signed off  If discussed at Long Length of Stay Meetings, dates discussed:    Additional Comments:  [name], Cadee Agro [place], RN 07/26/2018, 11:13 AM

## 2018-07-26 NOTE — Evaluation (Signed)
Physical Therapy Evaluation Patient Details Name: [name] MRN: [id number] DOB: May 06, 1946 Today's Date: 07/26/2018   History of Present Illness  Pt is a 72 y/o male admitted secondary to unstable angina. Found to have NSTEMI and is s/p L heart cath. PMH includes a fib, CAD, CHF, s/p ICD, and obesity.   Clinical Impression  Pt admitted secondary to problem above with deficits below. Pt fatigued during ambulation, which limited gait distance, however, pt reports this was his 4th walk of the day. Practiced with and without AD, and pt demonstrating mild unsteadiness. Required min guard to supervision for mobility. Educated about use of AD to increase safety, especially with longer distances, and educated about safe stair navigation at home. Pt eager to go home today. Will continue to follow acutely to maximize functional mobility independence and safety.     Follow Up Recommendations Home health PT;Supervision for mobility/OOB    Equipment Recommendations  None recommended by PT    Recommendations for Other Services       Precautions / Restrictions Precautions Precautions: Fall Restrictions Weight Bearing Restrictions: No      Mobility  Bed Mobility               General bed mobility comments: Sitting EOB upon entry.   Transfers Overall transfer level: Needs assistance Equipment used: Rolling walker (2 wheeled) Transfers: Sit to/from Stand Sit to Stand: Supervision         General transfer comment: Supervision for safety. Relied on momentum to stand. Demonstrated safe hand placement.   Ambulation/Gait Ambulation/Gait assistance: Min guard;Supervision Gait Distance (Feet): 50 Feet Assistive device: Rolling walker (2 wheeled);None Gait Pattern/deviations: Step-through pattern;Decreased stride length Gait velocity: Decreased    General Gait Details: Min guard to supervision for safety. Waddle type gait, however, no overt LOB noted. Practiced with and without  AD. Mild unsteadiness noted without AD, however, pt more winded. Gait distance limited secondary to fatigue. Pt also reports this was his 4th walk of the day. Discussed use of AD for longer distances.  Stairs Stairs: Yes       General stair comments: Verbally discussed appropriate technique and LE sequencing for stair navigation.   Wheelchair Mobility    Modified Rankin (Stroke Patients Only)       Balance Overall balance assessment: Needs assistance Sitting-balance support: No upper extremity supported;Feet supported Sitting balance-Leahy Scale: Good     Standing balance support: Bilateral upper extremity supported;No upper extremity supported;During functional activity Standing balance-Leahy Scale: Fair                               Pertinent Vitals/Pain Pain Assessment: No/denies pain    Home Living Family/patient expects to be discharged to:: Private residence Living Arrangements: Spouse/significant other Available Help at Discharge: Family;Available 24 hours/day Type of Home: House Home Access: Stairs to enter Entrance Stairs-Rails: Left Entrance Stairs-Number of Steps: 6-8 Home Layout: Multi-level;Able to live on main level with bedroom/bathroom Home Equipment: Dan Humphreys - 2 wheels;Walker - 4 wheels;Cane - single point;Bedside commode      Prior Function Level of Independence: Independent with assistive device(s)         Comments: Reports using cane and rollator for ambulation.      Hand Dominance        Extremity/Trunk Assessment   Upper Extremity Assessment Upper Extremity Assessment: Overall WFL for tasks assessed    Lower Extremity Assessment Lower Extremity Assessment: LLE deficits/detail;Generalized weakness;RLE deficits/detail  RLE Deficits / Details: Knee pain at baseline.  LLE Deficits / Details: Valgus deformity at knee. Knee pain at baseline.     Cervical / Trunk Assessment Cervical / Trunk Assessment: Normal  Communication    Communication: No difficulties  Cognition Arousal/Alertness: Awake/alert Behavior During Therapy: WFL for tasks assessed/performed Overall Cognitive Status: Within Functional Limits for tasks assessed                                        General Comments General comments (skin integrity, edema, etc.): Pt's wife present during session. Pt very eager to go home     Exercises     Assessment/Plan    PT Assessment Patient needs continued PT services  PT Problem List Decreased strength;Decreased balance;Decreased mobility;Decreased activity tolerance;Decreased knowledge of use of DME;Decreased knowledge of precautions;Cardiopulmonary status limiting activity       PT Treatment Interventions DME instruction;Gait training;Stair training;Therapeutic activities;Functional mobility training;Therapeutic exercise;Balance training;Patient/family education    PT Goals (Current goals can be found in the Care Plan section)  Acute Rehab PT Goals Patient Stated Goal: "to go home today"  PT Goal Formulation: With patient Time For Goal Achievement: 08/09/18 Potential to Achieve Goals: Good    Frequency Min 3X/week   Barriers to discharge        Co-evaluation               AM-PAC PT "6 Clicks" Daily Activity  Outcome Measure Difficulty turning over in bed (including adjusting bedclothes, sheets and blankets)?: None Difficulty moving from lying on back to sitting on the side of the bed? : A Little Difficulty sitting down on and standing up from a chair with arms (e.g., wheelchair, bedside commode, etc,.)?: A Little Help needed moving to and from a bed to chair (including a wheelchair)?: A Little Help needed walking in hospital room?: A Little Help needed climbing 3-5 steps with a railing? : A Lot 6 Click Score: 18    End of Session   Activity Tolerance: Patient limited by fatigue Patient left: in bed;with call bell/phone within reach;with family/visitor  present(sitting EOB ) Nurse Communication: Mobility status PT Visit Diagnosis: Other abnormalities of gait and mobility (R26.89);Difficulty in walking, not elsewhere classified (R26.2)    Time: [id number] PT Time Calculation (min) (ACUTE ONLY): 12 min   Charges:   PT Evaluation $PT Eval Low Complexity: 1 Low          Gladys DammeBrittany Angila Wombles, PT, DPT  Acute Rehabilitation Services  Pager: [phone]   Lehman PromBrittany S Sherlie Boyum 07/26/2018, 11:26 AM

## 2018-07-26 NOTE — Discharge Instructions (Signed)

## 2018-07-26 NOTE — Progress Notes (Signed)
CARDIAC REHAB PHASE I   PRE:  Rate/Rhythm: 63 paced  BP:  Supine: 167/79  Sitting:   Standing:    SaO2: 100% 2L  98% RA  MODE:  Ambulation: 88 ft   POST:  Rate/Rhythm: 104  BP:  Supine: 207/102,  205/88  Sitting:   Standing:    SaO2: 99%RA 0950-1040 Pt stated he has not walked in 5 days. Pt walked 88 ft on RA with walker independently. He was very DOE upon return to room and had to lay down. Hyperventilating. Asked for oxygen but he does not have it at home. Wife and I tried to get pt to purse lip breathe. His sats were at 98%. Pt finally able to slow breathing down while watching wife. Would recommend HHPT and HHRN to assist with pt at home as he is very deconditioned. Will refer to CRP 2 [place] but he is physically unable to do. Reviewed NTG use. Pt stated this is not his first admission and feels comfortable with ed he has received in the past. We have seen on numerous occasions. Left MI booklet and low sodium handouts. BP elevated with little activity. [facility] and elevated BP.   Luetta Nuttingharlene Michaelangelo Mittelman, RN BSN  07/26/2018 10:31 AM

## 2018-07-26 NOTE — Telephone Encounter (Signed)
New problem    Pt has a TOC appt with Franky MachoLuke on 8.19.19 @8 :30 per [place].

## 2018-07-27 ENCOUNTER — Telehealth (HOSPITAL_COMMUNITY): Payer: Self-pay

## 2018-07-27 NOTE — Telephone Encounter (Signed)
Lm to call back ./cy 

## 2018-07-27 NOTE — Telephone Encounter (Signed)
Patient contacted regarding [facility] on 07/[facility].  Patient understands to follow up with provider Corine ShelterLUKE KILROY  on 08/07/18 at [facility] Patient understands discharge instructions? YES Patient understands medications and regiment? YES Patient understands to bring all medications to this visit? YES  PER PT THINKS MAY BE COMING DOWN WITH COLD  HEART WISE FEELS OKAY AT THIS TIME

## 2018-07-27 NOTE — Telephone Encounter (Signed)
Follow up: ° ° °Patient returning call  ° ° ° °

## 2018-07-27 NOTE — Telephone Encounter (Signed)
Referral recv'd, patient unable to do CR.  Closed referral

## 2018-07-28 ENCOUNTER — Telehealth: Payer: Self-pay | Admitting: Cardiovascular Disease

## 2018-07-28 NOTE — Telephone Encounter (Signed)
Pt called to report that he has been having sinus congestion, cough with yellow sputum, he has chills like a fever but does not know his temp., he says that he is sob because is nose is congested, he denies pain with breathing but says he just doesn't feel. I strongly urged pt to try and be seen by his pmd or Urgent Care and he agreed and will call me back with the time of that appt.. Also, his Homecare nurse is coming today at 1pm and he will tell her how he is feeling so she can listen to his lungs and take his temp. Pt will let us know if he is able to get an appointment today.

## 2018-07-28 NOTE — Telephone Encounter (Signed)
New Message:       Pt c/o Shortness Of Breath: STAT if SOB developed within the last 24 hours or pt is noticeably SOB on the phone  1. Are you currently SOB (can you hear that pt is SOB on the phone)? yes  2. How long have you been experiencing SOB? Since thursday  3. Are you SOB when sitting or when up moving around? Both  4. Are you currently experiencing any other symptoms? Coughing and sweating   Pt states he is also on oxygen

## 2018-08-04 ENCOUNTER — Other Ambulatory Visit: Payer: Self-pay | Admitting: Cardiovascular Disease

## 2018-08-07 ENCOUNTER — Ambulatory Visit: Payer: Medicare Other | Admitting: Cardiology

## 2018-08-07 ENCOUNTER — Encounter: Payer: Self-pay | Admitting: Cardiology

## 2018-08-07 DIAGNOSIS — Z951 Presence of aortocoronary bypass graft: Secondary | ICD-10-CM | POA: Diagnosis not present

## 2018-08-07 DIAGNOSIS — I5043 Acute on chronic combined systolic (congestive) and diastolic (congestive) heart failure: Secondary | ICD-10-CM

## 2018-08-07 DIAGNOSIS — N183 Chronic kidney disease, stage 3 unspecified: Secondary | ICD-10-CM

## 2018-08-07 DIAGNOSIS — I2510 Atherosclerotic heart disease of native coronary artery without angina pectoris: Secondary | ICD-10-CM | POA: Diagnosis not present

## 2018-08-07 DIAGNOSIS — I255 Ischemic cardiomyopathy: Secondary | ICD-10-CM

## 2018-08-07 DIAGNOSIS — Z95810 Presence of automatic (implantable) cardiac defibrillator: Secondary | ICD-10-CM

## 2018-08-07 DIAGNOSIS — I214 Non-ST elevation (NSTEMI) myocardial infarction: Secondary | ICD-10-CM | POA: Diagnosis not present

## 2018-08-07 DIAGNOSIS — Z9861 Coronary angioplasty status: Secondary | ICD-10-CM

## 2018-08-07 NOTE — Assessment & Plan Note (Signed)
His weight is up from his discharge weigh -412 to 432 lbs. He notes SOB. He hasn't been taking his Lasix as prescribed-80 mg BID, he's taking 40 mg BID. He doesn't have a scale at home (> 350 lbs)

## 2018-08-07 NOTE — Assessment & Plan Note (Signed)
SVG-Dx PCI secondary to ISR 07/25/18

## 2018-08-07 NOTE — Assessment & Plan Note (Signed)
BMI 47. He has sleep apnea- not on C-pap

## 2018-08-07 NOTE — Assessment & Plan Note (Signed)
Last SCr 1.47 

## 2018-08-07 NOTE — Assessment & Plan Note (Signed)
EF 15-20% echo March 2018

## 2018-08-07 NOTE — Progress Notes (Signed)
08/07/2018 [name]   08-19-1946  [id number]  Primary Physician Devra DoppHowell, Tamieka, MD Primary Cardiologist: Dr Royann Shiversroitoru  HPI:  72 y/o morbidly obese AA male seen today post NSTEMI-PCI. He has severe ICM-15-20%, morbid obesity with untreated sleep apnea, H/O VT-s/p ICD, PAF-on Eliquis and AMIO, IDDM, CRI-3, and CAD-CABG '95 with multiple PCI's since. His last PCI was July 2017-SVG-Dx. The SVG-OM and SVG to RCA are occluded. The LIMA to LAD is patent but the distal LAD is occluded.   The pt presented 07/21/18 with chest pain and ruled in for NSTEMI. Troponin peaked at 6. Cath 07/24/18 showed ISR of the SVG-Dx and this was treated with PCI and DES on 07/25/18. He was discharged 07/26/18. His weight at discharge was 412 lbs. In the office today he complains of SOB and cough. In reviewing his medications it became apparent that he was not taking his Lasix as prescribed. He was taking 40 mg BID, not 80 mg BID.    Current Outpatient Medications  Medication Sig Dispense Refill  . acetaminophen (TYLENOL) 325 MG tablet Take 1-2 tablets (325-650 mg total) by mouth every 4 (four) hours as needed for mild pain.    Marland Kitchen. allopurinol (ZYLOPRIM) 100 MG tablet Take 100 mg by mouth daily.    Marland Kitchen. amiodarone (PACERONE) 200 MG tablet Take 1 tablet (200 mg total) by mouth daily. 90 tablet 3  . aspirin 81 MG chewable tablet Chew 1 tablet (81 mg total) by mouth daily. 90 tablet 3  . atorvastatin (LIPITOR) 80 MG tablet take 1 tablet by mouth once daily 90 tablet 2  . cholecalciferol (VITAMIN D) 1000 UNITS tablet Take 1,000 Units by mouth every other day.     . clopidogrel (PLAVIX) 75 MG tablet TAKE 1 TABLET BY MOUTH DAILY 90 tablet 3  . ELIQUIS 5 MG TABS tablet take 1 tablet by mouth twice a day 60 tablet 1  . furosemide (LASIX) 40 MG tablet Take 2 tablets (80 mg total) by mouth 2 (two) times daily. NEED OV. (Patient taking differently: Take 80 mg by mouth daily. NEED OV.) 360 tablet 0  . furosemide (LASIX) 40 MG tablet  TAKE 2 TABLETS BY MOUTH TWICE A DAY(KEEP OFFICE VISIT) 120 tablet 3  . Hyaluronan (ORTHOVISC) 30 MG/2ML SOSY Inject 30 mg into the articular space daily.    . insulin glargine (LANTUS) 100 UNIT/ML injection Inject 0.5 mLs (50 Units total) into the skin every morning. 10 mL 11  . insulin lispro (HUMALOG KWIKPEN) 100 UNIT/ML KiwkPen INJ 32 UNI [place] AT THE BEGINNING OF LARGEST MEAL    . INVOKANA 100 MG TABS Take 100 mg by mouth daily.    . metoprolol succinate (TOPROL-XL) 50 MG 24 hr tablet Take 1 tablet (50 mg total) by mouth daily. NEED OV. 90 tablet 2  . nitroGLYCERIN (NITROSTAT) 0.4 MG SL tablet place 1 tablet under the tongue if needed every 5 minutes for chest pain 25 tablet 3  . Omega-3 Fatty Acids (FISH OIL) 1200 MG CAPS Take 1,200 mg by mouth daily.     . sacubitril-valsartan (ENTRESTO) 49-51 MG Take 1 tablet by mouth 2 (two) times daily. 180 tablet 3  . sertraline (ZOLOFT) 50 MG tablet Take 1 tablet (50 mg total) by mouth daily. 90 tablet 0  . sertraline (ZOLOFT) 50 MG tablet TAKE 1 TABLET BY MOUTH EVERY DAY 90 tablet 3  . VICTOZA 18 MG/3ML SOPN Inject 1.2 mg into the skin daily.  0   No current facility-administered  medications for this visit.     Allergies  Allergen Reactions  . Coreg [Carvedilol] Nausea And Vomiting    Past Medical History:  Diagnosis Date  . CAD (coronary artery disease)    a. CABG 1995 and multiple stents. b. Known graft occlusions - NSTEMI 06/2016 s/p DES to distal SVG-diag.  . Chronic systolic CHF (congestive heart failure) (HCC)   . CKD (chronic kidney disease), stage III (HCC)   . Dietary noncompliance   . DM (diabetes mellitus) (HCC)   . DM type 2 causing CKD stage 3 (HCC)   . Dyslipidemia - low HDL and high triglycerides   . Gout   . Ischemic cardiomyopathy    a. LVEF 10-15% by cath 06/2016.  Marland Kitchen. Leukocytosis   . Morbid obesity (HCC)   . Myocardial infarction (HCC)   . NSTEMI (non-ST elevated myocardial infarction) (HCC) 06/26/2016  . PAF (paroxysmal  atrial fibrillation) (HCC)   . PONV (postoperative nausea and vomiting)   . Systemic hypertension   . Unstable angina (HCC) 07/21/2018    Social History   Socioeconomic History  . Marital status: Married    Spouse name: Not on file  . Number of children: Not on file  . Years of education: Not on file  . Highest education level: Not on file  Occupational History  . Not on file  Social Needs  . Financial resource strain: Not on file  . Food insecurity:    Worry: Not on file    Inability: Not on file  . Transportation needs:    Medical: Not on file    Non-medical: Not on file  Tobacco Use  . Smoking status: Former Smoker    Types: Cigarettes    Last attempt to quit: 03/10/2017    Years since quitting: 1.4  . Smokeless tobacco: Never Used  . Tobacco comment: "1-2 cigarettes per day"  Substance and Sexual Activity  . Alcohol use: Yes    Alcohol/week: 0.0 standard drinks    Comment: rare  . Drug use: No  . Sexual activity: Not on file  Lifestyle  . Physical activity:    Days per week: Not on file    Minutes per session: Not on file  . Stress: Not on file  Relationships  . Social connections:    Talks on phone: Not on file    Gets together: Not on file    Attends religious service: Not on file    Active member of club or organization: Not on file    Attends meetings of clubs or organizations: Not on file    Relationship status: Not on file  . Intimate partner violence:    Fear of current or ex partner: Not on file    Emotionally abused: Not on file    Physically abused: Not on file    Forced sexual activity: Not on file  Other Topics Concern  . Not on file  Social History Narrative  . Not on file     Family History  Problem Relation Age of Onset  . Heart failure Mother   . Dementia Mother   . Hearing loss Other   . Hypertension Other      Review of Systems: General: negative for chills, fever, night sweats or weight changes.  Cardiovascular: negative for  chest pain, dyspnea on exertion, edema, orthopnea, palpitations, paroxysmal nocturnal dyspnea or shortness of breath Dermatological: negative for rash Respiratory: negative for cough or wheezing Urologic: negative for hematuria Abdominal: negative for nausea, vomiting,  diarrhea, bright red blood per rectum, melena, or hematemesis Neurologic: negative for visual changes, syncope, or dizziness All other systems reviewed and are otherwise negative except as noted above.    Blood pressure 136/68, pulse 82, height 6\' 7"  (2.007 m), weight (!) 412 lb (186.9 kg).  General appearance: alert, cooperative, no distress, morbidly obese and in wheel chair Lungs: decreased breath sounds Heart: regular rate and rhythm Extremities: trace edema, soreness at Lt radial site- no hematoma, good pulse Neurologic: Grossly normal   ASSESSMENT AND PLAN:   Acute on chronic combined systolic (congestive) and diastolic (congestive) heart failure (HCC) His weight is up from his discharge weigh -412 to 432 lbs. He notes SOB. He hasn't been taking his Lasix as prescribed-80 mg BID, he's taking 40 mg BID. He doesn't have a scale at home (> 350 lbs)  Non-STEMI (non-ST elevated myocardial infarction) (HCC) Seen today post NSTEMI 07/21/18  CAD S/P percutaneous coronary angioplasty SVG-Dx PCI secondary to ISR 07/25/18  Hx of CABG CABG '95, multiple PCIs since. SVG-RCA and OM occluded. LIMA-LAD patent but distal LAD occluded. SVG-Dx PCI July 17 with ISR 07/26/18-PCI  Ischemic cardiomyopathy EF 15-20% echo March 2018  CKD (chronic kidney disease), stage III (HCC) Last SCr 1.47  Morbid obesity (HCC) BMI 47. He has sleep apnea- not on C-pap  AICD (automatic cardioverter/defibrillator) present ICD in place- h/o VT   PLAN  Increase Lasix to 80 mg BID. He needs to come back in 1-2 weeks for follow up. Check BMP then. I suggested he go to the ED if his symptoms worsen.   Corine Shelter PA-C 08/07/2018 8:59 AM

## 2018-08-07 NOTE — Patient Instructions (Signed)
Medication Instructions:  Your physician has recommended you make the following change in your medication:  1) INCREASE Lasix - TAKE 80 mg two tablets by mouth TWICE daily   Labwork: none  Testing/Procedures: none  Follow-Up: Your physician recommends that you schedule a follow-up appointment in:2 weeks  - you are scheduled for Friday September 6th at [facility] with Corine ShelterLuke Kilroy, [place].   Any Other Special Instructions Will Be Listed Below (If Applicable).     If you need a refill on your cardiac medications before your next appointment, please call your pharmacy.

## 2018-08-07 NOTE — Assessment & Plan Note (Signed)
ICD in place- h/o VT

## 2018-08-07 NOTE — Assessment & Plan Note (Signed)
Seen today post NSTEMI 07/21/18

## 2018-08-07 NOTE — Assessment & Plan Note (Signed)
CABG '95, multiple PCIs since. SVG-RCA and OM occluded. LIMA-LAD patent but distal LAD occluded. SVG-Dx PCI July 17 with ISR 07/26/18-PCI

## 2018-08-09 ENCOUNTER — Telehealth: Payer: Self-pay | Admitting: Cardiology

## 2018-08-09 ENCOUNTER — Ambulatory Visit (INDEPENDENT_AMBULATORY_CARE_PROVIDER_SITE_OTHER): Payer: Medicare Other

## 2018-08-09 ENCOUNTER — Ambulatory Visit (INDEPENDENT_AMBULATORY_CARE_PROVIDER_SITE_OTHER): Payer: Medicare Other | Admitting: *Deleted

## 2018-08-09 DIAGNOSIS — I5022 Chronic systolic (congestive) heart failure: Secondary | ICD-10-CM | POA: Diagnosis not present

## 2018-08-09 DIAGNOSIS — Z95810 Presence of automatic (implantable) cardiac defibrillator: Secondary | ICD-10-CM

## 2018-08-09 DIAGNOSIS — I255 Ischemic cardiomyopathy: Secondary | ICD-10-CM

## 2018-08-09 NOTE — Progress Notes (Signed)
Remote ICD transmission.   

## 2018-08-09 NOTE — Telephone Encounter (Signed)
Spoke with pt and reminded pt of remote transmission that is due today. Pt verbalized understanding.   

## 2018-08-10 NOTE — Progress Notes (Signed)
EPIC Encounter for ICM Monitoring  Patient Name: [name] is a 72 y.o. male Date: 08/10/2018 Primary Care Physican: Helane Rima, MD Primary Cardiologist: Croitoru Electrophysiologist: Faustino Congress Weight:412lbs (07/26/2018 discharge weight)      Heart Failure questions reviewed, pt said he is asymptomatic. He has not checked his weight since discharge and wife is getting him a scale today.  He said his weight was 420 lbs prior to hospitalization on 8/2.  He denied any difficulty with breathing or leg swelling.    Thoracic impedance abnormal suggesting fluid accumulation starting 05/24/2018. Impedance improved since taking Furosemide 80 mg bid as prescribed. Per office visit with Loma Boston PA 8/19, patient was aking Furosemide incorrectly. He had been taking 40 mg bid instead of 45m  Prescribed dosage: Furosemide 40 mg 2 tablets (80 mg total) twice a day.   Labs: 05/03/2017 Creatinine 1.68, BUN 17, Potassium 4.1, Sodium 148, EGFR 41-47 03/17/2017 Creatinine 1.61, BUN 17, Potassium 3.4, Sodium 136, EGFR 42-49  03/16/2017 Creatinine 1.47, BUN 15, Potassium 3.5, Sodium 139, EGFR 47-54  03/15/2017 Creatinine 1.53, BUN 15, Potassium 3.5, Sodium 137, EGFR 45-52  03/14/2017 Creatinine 1.58, BUN 17, Potassium 3.7, Sodium 138, EGFR 43-50  03/13/2017 Creatinine 1.67, BUN 19, Potassium 4.0, Sodium 137, EGFR 40-47  03/12/2017 Creatinine 1.55, BUN 19, Potassium 4.5, Sodium 137, EGFR 44-51  03/11/2017 Creatinine 1.59, BUN 25, Potassium 3.9, Sodium 136, EGFR 43-49  03/10/2017 Creatinine 1.61, BUN 17, Potassium 4.1, Sodium 137, EGFR 42-49   Recommendations: Advised to limit salt intake and review the food labels of foods he is eating.    Follow-up plan: ICM clinic phone appointment on 08/15/2018 to recheck fluid levels.   Office appointment scheduled 08/25/2018 with LKerin Ransom PA.    Copy of ICM check sent to Dr. KCaryl Comes Dr CSallyanne Kusterand LKerin Ransom PUtahfor review and if any recommendations will  call back.     3 month ICM trend: 08/10/2018    1 Year ICM trend:       LRosalene Billings RN 08/10/2018 12:33 PM

## 2018-08-11 ENCOUNTER — Encounter: Payer: Self-pay | Admitting: Cardiology

## 2018-08-12 ENCOUNTER — Other Ambulatory Visit: Payer: Self-pay | Admitting: Cardiovascular Disease

## 2018-08-14 ENCOUNTER — Other Ambulatory Visit: Payer: Self-pay | Admitting: Cardiovascular Disease

## 2018-08-15 ENCOUNTER — Ambulatory Visit (INDEPENDENT_AMBULATORY_CARE_PROVIDER_SITE_OTHER): Payer: Medicare Other

## 2018-08-15 ENCOUNTER — Telehealth: Payer: Self-pay

## 2018-08-15 DIAGNOSIS — Z95810 Presence of automatic (implantable) cardiac defibrillator: Secondary | ICD-10-CM

## 2018-08-15 DIAGNOSIS — I5022 Chronic systolic (congestive) heart failure: Secondary | ICD-10-CM

## 2018-08-15 NOTE — Progress Notes (Signed)
EPIC Encounter for ICM Monitoring  Patient Name: [name] is a 72 y.o. male Date: 08/15/2018 Primary Care Physican: Helane Rima, MD Primary Cardiologist: Croitoru Electrophysiologist: Faustino Congress Weight:412lbs (07/26/2018 discharge weight)       Heart Failure questions reviewed, pt asymptomatic.  He has ordered scales so he can weigh at home.   Thoracic impedance abnormal suggesting fluid accumulation but has significantly improved since taking Furosemide correctly, 80 mg bid.   Prescribed dosage: Furosemide 40 mg 2 tablets (80 mg total) twice a day. Per office visit with Loma Boston PA 8/19, patient was taking Furosemide incorrectly. He had been taking 40 mg bid instead of 17m.  Labs: 07/26/2018 Creatinine 1.47, BUN 15, Potassium 3.7, Sodium 139, EGFR 47-54 07/25/2018 Creatinine 1.58, BUN 19, Potassium 3.6, Sodium 141, EGFR 43-49  07/22/2018 Creatinine 1.64, BUN 16, Potassium 3.6, Sodium 142, EGFR 41-47 07/21/2018 Creatinine 1.61, BUN 13, Potassium 4.2, Sodium 141, EGFR 42-48  06/08/2018 Creatinine 1.69, BUN 16, Potassium 4.4, Sodium 141, EGFR 40-47  04/20/2018 Creatinine 1.71, BUN 15, Potassium 4.5, Sodium 144, EGFR 40-46  A complete set of results can be found in Results Review.  Recommendations: Reinforced fluid restriction to < 2 L daily and sodium restriction to less than 2000 mg daily.  Encouraged to call for fluid symptoms.  Follow-up plan: ICM clinic phone appointment on 08/24/2018 to recheck fluid levels which is day before office appointment scheduled 08/25/2018 with [place]in [place]    Copy of ICM check sent to Dr. KCaryl Comes Dr CSallyanne Kusterand LKerin Ransom PUtahfor review and if any recommendations will call back.     3 month ICM trend: 08/15/2018    1 Year ICM trend:       LRosalene Billings RN 08/15/2018 11:04 AM

## 2018-08-15 NOTE — Telephone Encounter (Signed)
Spoke with pt and reminded pt of remote transmission that is due today. Pt verbalized understanding.   

## 2018-08-15 NOTE — Telephone Encounter (Signed)
Wife called back and was having some difficulty with sending remote transmission. Attempted to assist a remote transmission but not successful.  Provided [employer] support number.

## 2018-08-24 ENCOUNTER — Ambulatory Visit (INDEPENDENT_AMBULATORY_CARE_PROVIDER_SITE_OTHER): Payer: Medicare Other

## 2018-08-24 DIAGNOSIS — Z95810 Presence of automatic (implantable) cardiac defibrillator: Secondary | ICD-10-CM

## 2018-08-24 DIAGNOSIS — I5022 Chronic systolic (congestive) heart failure: Secondary | ICD-10-CM

## 2018-08-24 NOTE — Progress Notes (Signed)
EPIC Encounter for ICM Monitoring  Patient Name: [name] is a 72 y.o. male Date: 08/24/2018 Primary Care Physican: Helane Rima, MD Primary Cardiologist: Croitoru Electrophysiologist: Faustino Congress Weight:412lbs (07/26/2018 discharge weight - no scales at home)             Heart Failure questions reviewed, pt asymptomatic.  He reports increase in urination with taking correct Furosemide dosage.    Thoracic impedance has improved but still suggests some fluid accumulation.  Prescribed dosage: Furosemide 40 mg 2 tablets (80 mg total) twice a day. Per office visit with Loma Boston PA 8/19, patient was taking Furosemide incorrectly. He had been taking 40 mg bid instead of 44m.  Labs: 07/26/2018 Creatinine 1.47, BUN 15, Potassium 3.7, Sodium 139, EGFR 47-54 07/25/2018 Creatinine 1.58, BUN 19, Potassium 3.6, Sodium 141, EGFR 43-49  07/22/2018 Creatinine 1.64, BUN 16, Potassium 3.6, Sodium 142, EGFR 41-47 07/21/2018 Creatinine 1.61, BUN 13, Potassium 4.2, Sodium 141, EGFR 42-48  06/08/2018 Creatinine 1.69, BUN 16, Potassium 4.4, Sodium 141, EGFR 40-47  04/20/2018 Creatinine 1.71, BUN 15, Potassium 4.5, Sodium 144, EGFR 40-46  A complete set of results can be found in Results Review.  Recommendations: Reinforced fluid restriction to < 2 L daily and sodium restriction to less than 2000 mg daily.  Encouraged to call for fluid symptoms.  Follow-up plan: ICM clinic phone appointment on 09/18/2018.   Office appointment scheduled 08/25/2018 with LKerin Ransom PA.    Copy of ICM check sent to Dr. KCaryl Comes Dr CSallyanne Kusterand LKerin Ransom PUtahfor review at office visit if needed on 08/25/2018.   3 month ICM trend: 08/24/2018    1 Year ICM trend:       LRosalene Billings RN 08/24/2018 3:12 PM

## 2018-08-25 ENCOUNTER — Encounter: Payer: Self-pay | Admitting: Cardiology

## 2018-08-25 ENCOUNTER — Ambulatory Visit: Payer: Medicare Other | Admitting: Cardiology

## 2018-08-25 VITALS — BP 122/70 | HR 78 | Ht 79.0 in | Wt >= 6400 oz

## 2018-08-25 DIAGNOSIS — N183 Chronic kidney disease, stage 3 unspecified: Secondary | ICD-10-CM

## 2018-08-25 DIAGNOSIS — I255 Ischemic cardiomyopathy: Secondary | ICD-10-CM | POA: Diagnosis not present

## 2018-08-25 DIAGNOSIS — Z951 Presence of aortocoronary bypass graft: Secondary | ICD-10-CM

## 2018-08-25 DIAGNOSIS — I5042 Chronic combined systolic (congestive) and diastolic (congestive) heart failure: Secondary | ICD-10-CM

## 2018-08-25 LAB — BASIC METABOLIC PANEL
BUN/Creatinine Ratio: 13 (ref 10–24)
BUN: 22 mg/dL (ref 8–27)
CO2: 25 mmol/L (ref 20–29)
Calcium: 9.3 mg/dL (ref 8.6–10.2)
Chloride: 97 mmol/L (ref 96–106)
Creatinine, Ser: 1.7 mg/dL — ABNORMAL HIGH (ref 0.76–1.27)
GFR calc Af Amer: 46 mL/min/{1.73_m2} — ABNORMAL LOW (ref >59)
GFR calc non Af Amer: 40 mL/min/{1.73_m2} — ABNORMAL LOW (ref >59)
Glucose: 191 mg/dL — ABNORMAL HIGH (ref 65–99)
Potassium: 4.2 mmol/L (ref 3.5–5.2)
Sodium: 141 mmol/L (ref 134–144)

## 2018-08-25 MED ORDER — ISOSORBIDE MONONITRATE ER 30 MG PO TB24: 30.0000 mg | ORAL_TABLET | Freq: Every day | ORAL | 3 refills | Status: DC

## 2018-08-25 NOTE — Progress Notes (Signed)
08/25/2018 [name]   [birth year]  [id number]  Primary Physician Devra Dopp, MD Primary Cardiologist: Dr Royann Shivers  HPI:  72 y/o morbidly obese AA male with a history of severe ICM-15-20%, morbid obesity with untreated sleep apnea, H/O VT-s/p ICD, PAF-on Eliquis and AMIO, IDDM, CRI-3, and CAD-CABG '95 with multiple PCI's since. His last PCI was 07/24/18 -SVG-Dx. The SVG-OM and SVG to RCA are occluded. The LIMA to LAD is patent but the distal LAD is occluded. I saw him in the office 08/07/18. He complained of SOB and cough. In reviewing his medications it became apparent that he was not taking his Lasix as prescribed. He was taking 40 mg BID, not 80 mg BID. I suggested he increase his Lasix to 80 mg BID and he is seen today in f/u. In there interm his thoracid impedence has improved. His weight is down 2 lbs. He has less SOB but admits he gets chest pain when he rushes or "gets excited".    Current Outpatient Medications  Medication Sig Dispense Refill  . acetaminophen (TYLENOL) 325 MG tablet Take 1-2 tablets (325-650 mg total) by mouth every 4 (four) hours as needed for mild pain.    Marland Kitchen allopurinol (ZYLOPRIM) 100 MG tablet Take 100 mg by mouth daily.    Marland Kitchen amiodarone (PACERONE) 200 MG tablet Take 1 tablet (200 mg total) by mouth daily. 90 tablet 3  . atorvastatin (LIPITOR) 80 MG tablet TAKE 1 TABLET BY MOUTH ONCE DAILY 90 tablet 0  . cholecalciferol (VITAMIN D) 1000 UNITS tablet Take 1,000 Units by mouth every other day.     . clopidogrel (PLAVIX) 75 MG tablet TAKE 1 TABLET BY MOUTH DAILY 90 tablet 3  . ELIQUIS 5 MG TABS tablet take 1 tablet by mouth twice a day 60 tablet 1  . ENTRESTO 24-26 MG TAKE 1 TABLET BY MOUTH TWICE DAILY 180 tablet 1  . furosemide (LASIX) 40 MG tablet TAKE 2 TABLETS(80 MG) BY MOUTH TWICE DAILY 360 tablet 0  . Hyaluronan (ORTHOVISC) 30 MG/2ML SOSY Inject 30 mg into the articular space daily.    . insulin glargine (LANTUS) 100 UNIT/ML injection Inject 0.5 mLs (50  Units total) into the skin every morning. 10 mL 11  . insulin lispro (HUMALOG KWIKPEN) 100 UNIT/ML KiwkPen INJ 32 UNI [place] AT THE BEGINNING OF LARGEST MEAL    . INVOKANA 100 MG TABS Take 100 mg by mouth daily.    . metoprolol succinate (TOPROL-XL) 50 MG 24 hr tablet Take 1 tablet (50 mg total) by mouth daily. NEED OV. 90 tablet 2  . nitroGLYCERIN (NITROSTAT) 0.4 MG SL tablet place 1 tablet under the tongue if needed every 5 minutes for chest pain 25 tablet 3  . Omega-3 Fatty Acids (FISH OIL) 1200 MG CAPS Take 1,200 mg by mouth daily.     . sertraline (ZOLOFT) 50 MG tablet Take 1 tablet (50 mg total) by mouth daily. 90 tablet 0  . sertraline (ZOLOFT) 50 MG tablet TAKE 1 TABLET BY MOUTH EVERY DAY 90 tablet 3  . VICTOZA 18 MG/3ML SOPN Inject 1.2 mg into the skin daily.  0   No current facility-administered medications for this visit.     Allergies  Allergen Reactions  . Coreg [Carvedilol] Nausea And Vomiting    Past Medical History:  Diagnosis Date  . CAD (coronary artery disease)    a. CABG 1995 and multiple stents. b. Known graft occlusions - NSTEMI 06/2016 s/p DES to distal SVG-diag.  Marland Kitchen  Chronic systolic CHF (congestive heart failure) (HCC)   . CKD (chronic kidney disease), stage III (HCC)   . Dietary noncompliance   . DM (diabetes mellitus) (HCC)   . DM type 2 causing CKD stage 3 (HCC)   . Dyslipidemia - low HDL and high triglycerides   . Gout   . Ischemic cardiomyopathy    a. LVEF 10-15% by cath 06/2016.  Marland Kitchen Leukocytosis   . Morbid obesity (HCC)   . Myocardial infarction (HCC)   . NSTEMI (non-ST elevated myocardial infarction) (HCC) 06/26/2016  . PAF (paroxysmal atrial fibrillation) (HCC)   . PONV (postoperative nausea and vomiting)   . Systemic hypertension   . Unstable angina (HCC) 07/21/2018    Social History   Socioeconomic History  . Marital status: Married    Spouse name: Not on file  . Number of children: Not on file  . Years of education: Not on file  . Highest  education level: Not on file  Occupational History  . Not on file  Social Needs  . Financial resource strain: Not on file  . Food insecurity:    Worry: Not on file    Inability: Not on file  . Transportation needs:    Medical: Not on file    Non-medical: Not on file  Tobacco Use  . Smoking status: Former Smoker    Types: Cigarettes    Last attempt to quit: 03/10/2017    Years since quitting: 1.4  . Smokeless tobacco: Never Used  . Tobacco comment: "1-2 cigarettes per day"  Substance and Sexual Activity  . Alcohol use: Yes    Alcohol/week: 0.0 standard drinks    Comment: rare  . Drug use: No  . Sexual activity: Not on file  Lifestyle  . Physical activity:    Days per week: Not on file    Minutes per session: Not on file  . Stress: Not on file  Relationships  . Social connections:    Talks on phone: Not on file    Gets together: Not on file    Attends religious service: Not on file    Active member of club or organization: Not on file    Attends meetings of clubs or organizations: Not on file    Relationship status: Not on file  . Intimate partner violence:    Fear of current or ex partner: Not on file    Emotionally abused: Not on file    Physically abused: Not on file    Forced sexual activity: Not on file  Other Topics Concern  . Not on file  Social History Narrative  . Not on file     Family History  Problem Relation Age of Onset  . Heart failure Mother   . Dementia Mother   . Hearing loss Other   . Hypertension Other      Review of Systems: General: negative for chills, fever, night sweats or weight changes.  Cardiovascular: negative for edema, orthopnea, palpitations, paroxysmal nocturnal dyspnea  Dermatological: negative for rash Respiratory: negative for cough or wheezing Urologic: negative for hematuria Abdominal: negative for nausea, vomiting, diarrhea, bright red blood per rectum, melena, or hematemesis Neurologic: negative for visual changes,  syncope, or dizziness All other systems reviewed and are otherwise negative except as noted above.    Blood pressure 122/70, pulse 78, height 6\' 7"  (2.007 m), weight (!) 410 lb 6.4 oz (186.2 kg), SpO2 95 %.  General appearance: alert, cooperative, no distress and morbidly obese Lungs: decreased  breath sounds Heart: regular rate and rhythm Extremities: no edema Neurologic: Grossly normal   ASSESSMENT AND PLAN:   Acute on chronic combined systolic (congestive) and diastolic (congestive) heart failure (HCC) He appears to be improved on increased Lasix  Non-STEMI (non-ST elevated myocardial infarction) (HCC) Post NSTEMI 07/21/18  CAD S/P percutaneous coronary angioplasty SVG-Dx PCI secondary to ISR 07/25/18 Some exertional chest pain- add Imdur  Hx of CABG CABG '95, multiple PCIs since. SVG-RCA and OM occluded. LIMA-LAD patent but distal LAD occluded. SVG-Dx PCI July 17 with ISR 07/26/18-PCI  Ischemic cardiomyopathy EF 15-20% echo March 2018  CKD (chronic kidney disease), stage III (HCC) Last SCr 1.47  Morbid obesity (HCC) BMI 47. He has sleep apnea- not on C-pap  AICD (automatic cardioverter/defibrillator) present ICD in place- h/o VT   PLAN  Same Rx. Check BMP. If renal stable consider increasing Entresto. F/U in 4 weeks  Corine Shelter PA-C 08/25/2018 9:33 AM

## 2018-08-25 NOTE — Progress Notes (Signed)
He is getting better, please just reinforce need for compliance with sodium restriction and diuretic as prescribed and recheck in a week. MCr

## 2018-08-25 NOTE — Progress Notes (Signed)
Call to patient and advised Dr Royann Shivers wants him to continue with salt restriction and taking diuretic as prescribed.  Advised will recheck fluid levels 08/31/2018.  He had a visit with Corine Shelter, PA today.

## 2018-08-25 NOTE — Progress Notes (Signed)
Thanks MCr 

## 2018-08-25 NOTE — Patient Instructions (Signed)
Medication Instructions:  START Imdur 30mg  Take 1 tablet once a day If you need a refill on your cardiac medications before your next appointment, please call your pharmacy.  Labwork: Your physician recommends that you return for lab work in: [employer] in our office  Testing/Procedures: None   Follow-Up: Your physician recommends that you schedule a follow-up appointment in: 4 weeks with Corine Shelter, PA-C Your physician recommends that you schedule a follow-up appointment in: 3 months with DR CROITORU  Any Other Special Instructions Will Be Listed Below (If Applicable).

## 2018-08-31 ENCOUNTER — Ambulatory Visit (INDEPENDENT_AMBULATORY_CARE_PROVIDER_SITE_OTHER): Payer: Medicare Other

## 2018-08-31 DIAGNOSIS — Z95810 Presence of automatic (implantable) cardiac defibrillator: Secondary | ICD-10-CM

## 2018-08-31 DIAGNOSIS — I5042 Chronic combined systolic (congestive) and diastolic (congestive) heart failure: Secondary | ICD-10-CM

## 2018-08-31 NOTE — Progress Notes (Signed)
EPIC Encounter for ICM Monitoring  Patient Name: [name] is a 72 y.o. male Date: 08/31/2018 Primary Care Physican: Helane Rima, MD Primary Cardiologist: Croitoru Electrophysiologist: Faustino Congress Weight:410lbs (08/25/2018 office weight - no scales at home)      Heart Failure questions reviewed, pt asymptomatic.  He reported eating new type of popcorn that may have a lot of salt so he will eliminate it.  BP was 104/60 and not having any dizziness or lightheadedness.   Thoracic impedance abnormal but did improve after Furosemide dosage was corrected after 8/19 office visit but thoracic impedance is worse again starting 08/20/2018.  Prescribed dosage: Furosemide 40 mg 2 tablets (80 mg total) twice a day. (Per office visit with Loma Boston PA 8/19, pt was taking Furosemide incorrectly and had been taking 40 mg bid instead of 80 mg bid)  Labs: 08/25/2018 Creatinine 1.70, BUN 22, Potassium 4.2, Sodium 141, EGFR 40-46 07/26/2018 Creatinine1.47, BUN15, Potassium3.7, [id number], [id number] 07/25/2018 Creatinine1.58, BUN19, Potassium3.6, [id number], [id number]  07/22/2018 Creatinine1.64, BUN16, Potassium3.6, [id number], [id number] 07/21/2018 Creatinine1.61, BUN13, Potassium4.2, Sodium141, [id number]  06/08/2018 Creatinine1.69, BUN16, Potassium4.4, Sodium141, [id number]  04/20/2018 Creatinine1.71, BUN15, Potassium4.5, CLEXNT700, [id number] A complete set of results can be found in Results Review.  Recommendations: Advised to limit salt to less than 2000 mg daily.  Encouraged to call for fluid symptoms.  Will call back if any recommendations  Follow-up plan: ICM clinic phone appointment on 09/11/2018 (manual send) to recheck fluid levels.   Office appointment scheduled 09/25/2018 with Kerin Ransom, PA.    Copy of ICM check sent to Dr. Caryl Comes and Dr Sallyanne Kuster for review and recommendations if needed.   3 month ICM trend: 08/31/2018    1 Year ICM trend:        Rosalene Billings, RN 08/31/2018 12:47 PM

## 2018-09-11 ENCOUNTER — Ambulatory Visit (INDEPENDENT_AMBULATORY_CARE_PROVIDER_SITE_OTHER): Payer: Medicare Other

## 2018-09-11 DIAGNOSIS — I5042 Chronic combined systolic (congestive) and diastolic (congestive) heart failure: Secondary | ICD-10-CM

## 2018-09-11 DIAGNOSIS — Z95810 Presence of automatic (implantable) cardiac defibrillator: Secondary | ICD-10-CM

## 2018-09-12 ENCOUNTER — Telehealth: Payer: Self-pay | Admitting: *Deleted

## 2018-09-12 LAB — CUP PACEART REMOTE DEVICE CHECK
Battery Voltage: 2.97 V
Brady Statistic AP VP Percent: 0.21 %
Brady Statistic AP VS Percent: 98.39 %
Brady Statistic AS VS Percent: 1.4 %
Brady Statistic RV Percent Paced: 0.23 %
HIGH POWER IMPEDANCE MEASURED VALUE: 49 Ohm
HighPow Impedance: 39 Ohm
Implantable Lead Implant Date: 20160520
Implantable Lead Location: 753860
Lead Channel Impedance Value: 342 Ohm
Lead Channel Impedance Value: 399 Ohm
Lead Channel Pacing Threshold Pulse Width: 0.4 ms
Lead Channel Pacing Threshold Pulse Width: 0.4 ms
Lead Channel Sensing Intrinsic Amplitude: 13.125 mV
Lead Channel Sensing Intrinsic Amplitude: 13.125 mV
Lead Channel Sensing Intrinsic Amplitude: 4.375 mV
Lead Channel Sensing Intrinsic Amplitude: 4.375 mV
Lead Channel Setting Pacing Amplitude: 1.75 V
Lead Channel Setting Pacing Pulse Width: 0.4 ms
Lead Channel Setting Sensing Sensitivity: 0.3 mV
MDC IDC LEAD IMPLANT DT: 20160520
MDC IDC LEAD LOCATION: 753859
MDC IDC MSMT BATTERY REMAINING LONGEVITY: 58 mo
MDC IDC MSMT LEADCHNL RA IMPEDANCE VALUE: 456 Ohm
MDC IDC MSMT LEADCHNL RA PACING THRESHOLD AMPLITUDE: 0.875 V
MDC IDC MSMT LEADCHNL RV PACING THRESHOLD AMPLITUDE: 1.25 V
MDC IDC PG IMPLANT DT: 20160520
MDC IDC SESS DTM: 20190821172404
MDC IDC SET LEADCHNL RV PACING AMPLITUDE: 2.5 V
MDC IDC STAT BRADY AS VP PERCENT: 0 %
MDC IDC STAT BRADY RA PERCENT PACED: 94.52 %

## 2018-09-12 NOTE — Progress Notes (Signed)
EPIC Encounter for ICM Monitoring  Patient Name: [name] is a 72 y.o. male Date: 09/12/2018 Primary Care Physican: Howell, Tamieka, MD Primary Cardiologist: Croitoru Electrophysiologist: Klein Dry Weight:410lbs (08/25/2018 office weight- no scales at home)  Clinical Status (31-Aug-2018 to 11-Sep-2018)  Treated VT/VF 0 episodes   AT/AF 1 episode   Time in AT/AF 0.8 hr/day (3.3%)   Longest AT/AF 9 hours Observations (2) (31-Aug-2018 to 11-Sep-2018)  AT/AF >= 6 hr for 1 days.  Patient Activity less than 1 hr/day for 1 weeks.  Arrhythmia Episode List: 31-Aug-2018 01:22:04 to 11-Sep-2018 18:29:31       Heart Failure questions reviewed, pt asymptomatic for fluid symptoms.  Patient reported he fell on 9/13 and tripped over ottoman hitting his chest which resulted in orange size lump developed over the device that is warm to touch and painful.  He would like to have it checked to make sure it has not caused a problem with the device.  Message sent to device clinic to call patient to schedule device clinic visit.    Thoracic impedance abnormal suggesting fluid accumulation starting June 2019.  Prior AT/AF episode occurred 08/09/2018 report for 20 seconds.  Prescribed: Furosemide 40 mg 2 tablets (80 mg total) twice a day. (Per office visit with Luke Kilory PA 8/19, pt was taking Furosemide incorrectly and had been taking 40 mg bid instead of 80 mg bid).  He reported compliance with medication and not missing any dosages.    Labs: 08/25/2018 Creatinine 1.70, BUN 22, Potassium 4.2, Sodium 141, EGFR 40-46 07/26/2018 Creatinine1.47, BUN15, Potassium3.7, Sodium139, EGFR47-54 07/25/2018 Creatinine1.58, BUN19, Potassium3.6, Sodium141, EGFR43-49  07/22/2018 Creatinine1.64, BUN16, Potassium3.6, Sodium142, EGFR41-47 07/21/2018 Creatinine1.61, BUN13, Potassium4.2, Sodium141, EGFR42-48  06/08/2018 Creatinine1.69, BUN16, Potassium4.4, Sodium141, EGFR40-47    04/20/2018 Creatinine1.71, BUN15, Potassium4.5, Sodium144, EGFR40-46 A complete set of results can be found in Results Review.  Recommendations: No changes.  Reinforced salt restriction to < 2000 mg daily and fluid intake to 64 oz daily.  Encouraged to call for fluid symptoms.  He has appointment with kidney specialist 10/3.  Follow-up plan: ICM clinic phone appointment on 09/22/2018 to recheck fluid levels before office appointment scheduled 09/25/2018 with Luke Kilroy, PA.  Copy of ICM check sent to Dr. Klein and Dr Croitoru for review and recommendations if needed.   3 month ICM trend: 09/11/2018    1 Year ICM trend:       Laurie S Short, RN 09/12/2018 8:44 AM   

## 2018-09-12 NOTE — Progress Notes (Signed)
Suspect the device is functioning normally, but will look at download. The lump could be a hematoma in the pocket and this could alter the reliability of the Optivol.

## 2018-09-12 NOTE — Telephone Encounter (Signed)
-----   Message from Karie SodaLaurie S Short, RN sent at 09/12/2018  1:23 PM EDT ----- Regarding: Swelling around device site after fall Patient reported he fell on 9/13 which resulted in swelling about the size of an orange over the device site.  He said it is painful, warm to touch and skin is intact.  He would like to be seen in the office to check the area and make sure there is no damage to the device.  Advised would have device clinic call him to schedule an appt to check the area around the device.    Thanks Jacki ConesLaurie

## 2018-09-12 NOTE — Telephone Encounter (Signed)
Called patient regarding his device site. Patient states that he was doing PT exercises at home when he fell and hit his ICD site on an ottoman on 9/13. Patient states that he believes that his site is the size of an orange and it's sore to touch. Patient denies any ecchymosis or drainage from the area. Device clinic appointment scheduled for 9/25 @ 1430. [facility] Patient verbalized understanding.

## 2018-09-13 ENCOUNTER — Ambulatory Visit (INDEPENDENT_AMBULATORY_CARE_PROVIDER_SITE_OTHER): Payer: Medicare Other | Admitting: *Deleted

## 2018-09-13 DIAGNOSIS — I255 Ischemic cardiomyopathy: Secondary | ICD-10-CM

## 2018-09-13 DIAGNOSIS — I5022 Chronic systolic (congestive) heart failure: Secondary | ICD-10-CM

## 2018-09-14 ENCOUNTER — Telehealth: Payer: Self-pay

## 2018-09-14 NOTE — Telephone Encounter (Signed)
Returned patient call as requested by voice mail message.  He stated he discovered that orange juice has a lot of salt in it and this may be adding to the fluid accumulation.  Suggested alternative to use fresh oranges for orange juice which will not have sodium and only natural sugar.  Encouraged him to continue read food labels for salt amounts.

## 2018-09-16 NOTE — Progress Notes (Signed)
Patient presents to the office with c/o swelling and ecchymosis around ICD site. Patient states that he hit his L chest on an ottoman while working out at his home a couple of weeks ago. ICD site assessed, no edema or ecchymosis noted at the site. Ecchymosis and swelling noted just below patient's L nipple line. I explained to patient that the area will need some additional time to heal. I told patient that he may need to contact his PCP if the site does not improve over the next couple of weeks.  Patient and caregiver verbalized understanding.  Remote from 9/23 was reviewed and demonstrated normal device function.   Patient's caregiver also asked for O2 for the patient during this ov. Patient was not visibly or audibly ShOB during the appointment. Patient ultimately refused the O2 stating that he would prefer to go home instead.

## 2018-09-18 ENCOUNTER — Ambulatory Visit (INDEPENDENT_AMBULATORY_CARE_PROVIDER_SITE_OTHER): Payer: Medicare Other

## 2018-09-18 DIAGNOSIS — I5022 Chronic systolic (congestive) heart failure: Secondary | ICD-10-CM

## 2018-09-18 DIAGNOSIS — Z95810 Presence of automatic (implantable) cardiac defibrillator: Secondary | ICD-10-CM

## 2018-09-18 NOTE — Progress Notes (Signed)
EPIC Encounter for ICM Monitoring  Patient Name: [name] is a 72 y.o. male Date: 09/18/2018 Primary Care Physican: Helane Rima, MD Primary Cardiologist: Croitoru Electrophysiologist: Caryl Comes Dry Weight: 410lbs (9/6/2019officeweight- no scales at home)       Heart Failure questions reviewed, pt asymptomatic.  He reported following a low salt diet.     Thoracic impedance continues to be abnormal suggesting fluid accumulation since June 2019.  Prescribed: Furosemide 40 mg 2 tablets (80 mg total) twice a day. (Per office visit with Loma Boston PA 8/19, pt was taking Furosemide incorrectlyand had been taking 40 mg bid instead of 80 mgbid).  He reported compliance with medication and not missing any dosages.    Labs: 08/25/2018 Creatinine 1.70, BUN 22, Potassium 4.2, Sodium 141, EGFR 40-46 07/26/2018 Creatinine1.47, BUN15, Potassium3.7, [id number], [id number] 07/25/2018 Creatinine1.58, BUN19, Potassium3.6, [id number], [id number]  07/22/2018 Creatinine1.64, BUN16, Potassium3.6, [id number], [id number] 07/21/2018 Creatinine1.61, BUN13, Potassium4.2, Sodium141, [id number]  06/08/2018 Creatinine1.69, BUN16, Potassium4.4, Sodium141, [id number]  04/20/2018 Creatinine1.71, BUN15, Potassium4.5, [id number], [id number] A complete set of results can be found in Results Review.  Recommendations: No changes.  Reinforced salt intake.   Encouraged to call for fluid symptoms.  Follow-up plan: ICM clinic phone appointment on 10/02/2018 to recheck fluid levels since there is an office appointment scheduled 09/25/2018 with Dr. Sallyanne Kuster.    Copy of ICM check sent to Dr. Caryl Comes.   3 month ICM trend: 09/18/2018    1 Year ICM trend:       Rosalene Billings, RN 09/18/2018 12:47 PM

## 2018-09-20 ENCOUNTER — Other Ambulatory Visit: Payer: Self-pay | Admitting: Medical

## 2018-09-20 NOTE — Telephone Encounter (Signed)
This is Dr. Croitoru's pt 

## 2018-09-21 ENCOUNTER — Emergency Department (HOSPITAL_COMMUNITY): Payer: Medicare Other

## 2018-09-21 ENCOUNTER — Emergency Department (HOSPITAL_COMMUNITY)
Admission: EM | Admit: 2018-09-21 | Discharge: 2018-09-21 | Disposition: A | Discharge status: Home / Self Care | Payer: Medicare Other | Attending: Emergency Medicine | Admitting: Emergency Medicine

## 2018-09-21 ENCOUNTER — Encounter (HOSPITAL_COMMUNITY): Payer: Self-pay | Admitting: Emergency Medicine

## 2018-09-21 DIAGNOSIS — Z87891 Personal history of nicotine dependence: Secondary | ICD-10-CM | POA: Diagnosis not present

## 2018-09-21 DIAGNOSIS — Z79899 Other long term (current) drug therapy: Secondary | ICD-10-CM | POA: Diagnosis not present

## 2018-09-21 DIAGNOSIS — Z794 Long term (current) use of insulin: Secondary | ICD-10-CM | POA: Diagnosis not present

## 2018-09-21 DIAGNOSIS — N183 Chronic kidney disease, stage 3 (moderate): Secondary | ICD-10-CM | POA: Insufficient documentation

## 2018-09-21 DIAGNOSIS — R1110 Vomiting, unspecified: Secondary | ICD-10-CM | POA: Diagnosis present

## 2018-09-21 DIAGNOSIS — E119 Type 2 diabetes mellitus without complications: Secondary | ICD-10-CM | POA: Diagnosis not present

## 2018-09-21 DIAGNOSIS — I2510 Atherosclerotic heart disease of native coronary artery without angina pectoris: Secondary | ICD-10-CM | POA: Diagnosis not present

## 2018-09-21 DIAGNOSIS — R0602 Shortness of breath: Secondary | ICD-10-CM | POA: Insufficient documentation

## 2018-09-21 DIAGNOSIS — R0789 Other chest pain: Secondary | ICD-10-CM | POA: Diagnosis not present

## 2018-09-21 DIAGNOSIS — R072 Precordial pain: Secondary | ICD-10-CM | POA: Insufficient documentation

## 2018-09-21 DIAGNOSIS — I129 Hypertensive chronic kidney disease with stage 1 through stage 4 chronic kidney disease, or unspecified chronic kidney disease: Secondary | ICD-10-CM | POA: Insufficient documentation

## 2018-09-21 LAB — CBC
HCT: 32.4 % — ABNORMAL LOW (ref 39.0–52.0)
Hemoglobin: 8.6 g/dL — ABNORMAL LOW (ref 13.0–17.0)
MCH: 20.1 pg — AB (ref 26.0–34.0)
MCHC: 26.5 g/dL — ABNORMAL LOW (ref 30.0–36.0)
MCV: 75.7 fL — AB (ref 78.0–100.0)
PLATELETS: 201 10*3/uL (ref 150–400)
RBC: 4.28 MIL/uL (ref 4.22–5.81)
RDW: 20.2 % — AB (ref 11.5–15.5)
WBC: 9.7 10*3/uL (ref 4.0–10.5)

## 2018-09-21 LAB — BASIC METABOLIC PANEL
Anion gap: 11 (ref 5–15)
BUN: 15 mg/dL (ref 8–23)
CALCIUM: 8.7 mg/dL — AB (ref 8.9–10.3)
CHLORIDE: 102 mmol/L (ref 98–111)
CO2: 28 mmol/L (ref 22–32)
CREATININE: 1.58 mg/dL — AB (ref 0.61–1.24)
GFR calc non Af Amer: 43 mL/min — ABNORMAL LOW (ref >60)
GFR, EST AFRICAN AMERICAN: 49 mL/min — AB (ref >60)
Glucose, Bld: 142 mg/dL — ABNORMAL HIGH (ref 70–99)
Potassium: 3.6 mmol/L (ref 3.5–5.1)
SODIUM: 141 mmol/L (ref 135–145)

## 2018-09-21 LAB — I-STAT TROPONIN, ED: Troponin i, poc: 0 ng/mL (ref 0.00–0.08)

## 2018-09-21 NOTE — ED Provider Notes (Signed)
[facility] EMERGENCY DEPARTMENT Provider Note   CSN: [id number] Arrival date & time: 09/21/18  1208     History   Chief Complaint Chief Complaint  Patient presents with  . Chest Pain    HPI [name] is a 72 y.o. male.  HPI Patient is a 72 year old male who presents the emergency department after developing acute onset vomiting followed by chest discomfort that lasts approximately 10 minutes.  He had transient shortness of breath.  On arrival to emergency department he states he feels fine.  He is asymptomatic.  He has no chest pressure chest discomfort.  He does have a known history of congestive heart failure and coronary artery disease status post CABG.  He has been compliant with his medications.  He awoke this morning was having a normal day.  He was on his way to the physician's office for routine follow-up when his symptoms acutely began.  His wife reports that this intermittently will happen to him is related to his history of VT and defibrillation and states that he becomes anxious at times and usually presents with chest pain.  Patient agrees that he thinks this may have been a panic attack.  He feels much better at this time and is interested in going home.   Past Medical History:  Diagnosis Date  . CAD (coronary artery disease)    a. CABG 1995 and multiple stents. b. Known graft occlusions - NSTEMI 06/2016 s/p DES to distal SVG-diag.  . Chronic systolic CHF (congestive heart failure) (HCC)   . CKD (chronic kidney disease), stage III (HCC)   . Dietary noncompliance   . DM (diabetes mellitus) (HCC)   . DM type 2 causing CKD stage 3 (HCC)   . Dyslipidemia - low HDL and high triglycerides   . Gout   . Ischemic cardiomyopathy    a. LVEF 10-15% by cath 06/2016.  Marland Kitchen Leukocytosis   . Morbid obesity (HCC)   . Myocardial infarction (HCC)   . NSTEMI (non-ST elevated myocardial infarction) (HCC) 06/26/2016  . PAF (paroxysmal atrial fibrillation) (HCC)   .  PONV (postoperative nausea and vomiting)   . Systemic hypertension   . Unstable angina (HCC) 07/21/2018    Patient Active Problem List   Diagnosis Date Noted  . CAD S/P percutaneous coronary angioplasty 08/07/2018  . Acute on chronic combined systolic (congestive) and diastolic (congestive) heart failure (HCC) 07/21/2018  . Unstable angina (HCC) 07/21/2018  . Precordial chest pain 07/21/2018  . Hx of CABG   . AICD discharge   . Ventricular fibrillation (HCC) 03/10/2017  . Sustained VT (ventricular tachycardia) (HCC) 08/25/2016  . Dietary noncompliance 06/30/2016  . Ischemic cardiomyopathy 06/30/2016  . CKD (chronic kidney disease), stage III (HCC)   . Non-STEMI (non-ST elevated myocardial infarction) (HCC) 06/26/2016  . Essential hypertension 12/03/2015  . Paroxysmal atrial fibrillation (HCC) 12/03/2015  . At risk for sudden cardiac death 05-17-15  . AICD (automatic cardioverter/defibrillator) present 17-May-2015  . STEMI (ST elevation myocardial infarction) (HCC) 01/29/2015  . ST elevation myocardial infarction (STEMI) involving other coronary artery of anterior wall (HCC)   . CAD s/p CABG 1995, graft dependent (LIMA to LAD, SVG to diagonal stented, other grafts occluded) 01/25/2014  . Cardiomyopathy, ischemic 01/25/2014  . Chronic combined systolic and diastolic CHF (congestive heart failure) (HCC) 01/25/2014  . Morbid obesity (HCC) 01/25/2014  . Controlled type 2 diabetes mellitus with stage 3 chronic kidney disease (HCC) 01/25/2014  . Gout 01/25/2014  . Dyslipidemia - low  HDL and high triglycerides 01/25/2014    Past Surgical History:  Procedure Laterality Date  . CARDIAC CATHETERIZATION N/A 06/28/2016   Procedure: Left Heart Cath and Cors/Grafts Angiography;  Surgeon: Lennette Bihari, MD;  Location: [facility] INVASIVE CV LAB;  Service: Cardiovascular;  Laterality: N/A;  . CARDIAC CATHETERIZATION N/A 06/29/2016   Procedure: Coronary Stent Intervention;  Surgeon: Lennette Bihari, MD;   Location: [facility] INVASIVE CV LAB;  Service: Cardiovascular;  Laterality: N/A;  . CORONARY ANGIOPLASTY WITH STENT PLACEMENT  01/12/2008   multivessel CAD occluded vein grafts to multiple sites including RCA,obtuse marginal branch & CX.  Successful PTCA and stenting distal graft.  . CORONARY ARTERY BYPASS GRAFT  1995  . CORONARY BALLOON ANGIOPLASTY N/A 03/16/2017   Procedure: Coronary Balloon Angioplasty;  Surgeon: Corky Crafts, MD;  Location: [facility] INVASIVE CV LAB;  Service: Cardiovascular;  Laterality: N/A;  . CORONARY STENT INTERVENTION N/A 07/25/2018   Procedure: CORONARY STENT INTERVENTION;  Surgeon: Corky Crafts, MD;  Location: [facility] INVASIVE CV LAB;  Service: Cardiovascular;  Laterality: N/A;  . EP IMPLANTABLE DEVICE N/A 05/09/2015   Procedure: Icd Implant;  Surgeon: Thurmon Fair, MD;  Location: [facility] INVASIVE CV LAB;  Service: Cardiovascular;  Laterality: N/A;  . LEFT HEART CATH AND CORS/GRAFTS ANGIOGRAPHY N/A 03/14/2017   Procedure: Left Heart Cath and Cors/Grafts Angiography;  Surgeon: Corky Crafts, MD;  Location: [facility] INVASIVE CV LAB;  Service: Cardiovascular;  Laterality: N/A;  . LEFT HEART CATH AND CORS/GRAFTS ANGIOGRAPHY N/A 07/24/2018   Procedure: LEFT HEART CATH AND CORS/GRAFTS ANGIOGRAPHY;  Surgeon: Runell Gess, MD;  Location: [facility] INVASIVE CV LAB;  Service: Cardiovascular;  Laterality: N/A;  . LEFT HEART CATHETERIZATION WITH CORONARY ANGIOGRAM N/A 01/29/2015   Procedure: LEFT HEART CATHETERIZATION WITH CORONARY ANGIOGRAM;  Surgeon: Lennette Bihari, MD;  Location: [facility] CATH LAB;  Service: Cardiovascular;  Laterality: N/A;  . THORACIC AORTOGRAM N/A 03/16/2017   Procedure: Thoracic Aortogram;  Surgeon: Corky Crafts, MD;  Location: [facility] INVASIVE CV LAB;  Service: Cardiovascular;  Laterality: N/A;  . TONSILLECTOMY          Home Medications    Prior to Admission medications   Medication Sig Start Date End Date Taking? Authorizing Provider  allopurinol (ZYLOPRIM) 100 MG  tablet Take 100 mg by mouth daily.   Yes [provider]  amiodarone (PACERONE) 200 MG tablet Take 1 tablet (200 mg total) by mouth daily. 07/10/18  Yes Croitoru, Mihai, MD  atorvastatin (LIPITOR) 80 MG tablet TAKE 1 TABLET BY MOUTH ONCE DAILY Patient taking differently: Take 80 mg by mouth daily.  08/14/18  Yes Croitoru, Mihai, MD  cholecalciferol (VITAMIN D) 1000 UNITS tablet Take 1,000 Units by mouth every other day.    Yes [provider]  clopidogrel (PLAVIX) 75 MG tablet TAKE 1 TABLET BY MOUTH DAILY Patient taking differently: Take 75 mg by mouth daily.  04/28/18  Yes Croitoru, Mihai, MD  ELIQUIS 5 MG TABS tablet take 1 tablet by mouth twice a day 10/17/17  Yes Croitoru, Mihai, MD  ENTRESTO 24-26 MG TAKE 1 TABLET BY MOUTH TWICE DAILY Patient taking differently: Take 1 tablet by mouth 2 (two) times daily.  08/14/18  Yes Croitoru, Mihai, MD  furosemide (LASIX) 40 MG tablet TAKE 2 TABLETS(80 MG) BY MOUTH TWICE DAILY Patient taking differently: Take 40 mg by mouth 2 (two) times daily.  08/14/18  Yes Croitoru, Mihai, MD  insulin glargine (LANTUS) 100 UNIT/ML injection Inject 0.5 mLs (50 Units total) into the skin every  morning. 03/17/17  Yes Seiler, Amber K, NP  insulin lispro (HUMALOG KWIKPEN) 100 UNIT/ML KiwkPen Inject 20 Units into the skin every morning.  03/22/18  Yes [provider]  INVOKANA 100 MG TABS Take 100 mg by mouth daily. 12/28/13  Yes [provider]  isosorbide mononitrate (IMDUR) 30 MG 24 hr tablet Take 1 tablet (30 mg total) by mouth daily. 08/25/18 09/24/18 Yes Kilroy, Luke K, PA-C  metoprolol succinate (TOPROL-XL) 50 MG 24 hr tablet Take 1 tablet (50 mg total) by mouth daily. NEED OV. 04/13/18  Yes Croitoru, Mihai, MD  nitroGLYCERIN (NITROSTAT) 0.4 MG SL tablet PLACE 1 TABLET UNDER THE TONGUE AS NEEDED EVERY 5 MINUTES FOR CHEST PAIN 09/20/18  Yes Croitoru, Mihai, MD  Omega-3 Fatty Acids (FISH OIL) 1200 MG CAPS Take 1,200 mg by mouth daily.    Yes  [provider]  sertraline (ZOLOFT) 50 MG tablet Take 1 tablet (50 mg total) by mouth daily. 04/20/18  Yes Duke Salvia, MD  VICTOZA 18 MG/3ML SOPN Inject 1.2 mg into the skin daily. 06/02/16  Yes [provider]  acetaminophen (TYLENOL) 325 MG tablet Take 1-2 tablets (325-650 mg total) by mouth every 4 (four) hours as needed for mild pain. 05/10/15   Abelino Derrick, PA-C  Hyaluronan (ORTHOVISC) 30 MG/2ML SOSY Inject 30 mg into the articular space daily. Every six months. Last dose was in August 2019 05/22/18   [provider]  sertraline (ZOLOFT) 50 MG tablet TAKE 1 TABLET BY MOUTH EVERY DAY Patient taking differently: Take 50 mg by mouth daily.  08/04/18   Croitoru, Rachelle Hora, MD    Family History Family History  Problem Relation Age of Onset  . Heart failure Mother   . Dementia Mother   . Hearing loss Other   . Hypertension Other     Social History Social History   Tobacco Use  . Smoking status: Former Smoker    Types: Cigarettes    Last attempt to quit: 03/10/2017    Years since quitting: 1.5  . Smokeless tobacco: Never Used  . Tobacco comment: "1-2 cigarettes per day"  Substance Use Topics  . Alcohol use: Not Currently    Alcohol/week: 0.0 standard drinks    Comment: rare  . Drug use: No     Allergies   Coreg [carvedilol]   Review of Systems Review of Systems  All other systems reviewed and are negative.    Physical Exam Updated Vital Signs BP (!) 111/55   Pulse (!) 59   Temp 98.1 F (36.7 C) (Oral)   Resp 17   Ht 6' 7.5" (2.019 m)   Wt (!) 175.5 kg   SpO2 100%   BMI 43.05 kg/m   Physical Exam  Constitutional: He is oriented to person, place, and time. He appears well-developed and well-nourished.  Morbidly obese  HENT:  Head: Normocephalic and atraumatic.  Eyes: EOM are normal.  Neck: Normal range of motion.  Cardiovascular: Normal rate, regular rhythm, normal heart sounds and intact distal pulses.  Pulmonary/Chest: Effort  normal and breath sounds normal. No respiratory distress.  Abdominal: Soft. He exhibits no distension. There is no tenderness.  Musculoskeletal: Normal range of motion.  Neurological: He is alert and oriented to person, place, and time.  Skin: Skin is warm and dry.  Psychiatric: He has a normal mood and affect. Judgment normal.  Nursing note and vitals reviewed.    ED Treatments / Results  Labs (all labs ordered are listed, but only abnormal  results are displayed) Labs Reviewed  BASIC METABOLIC PANEL - Abnormal; Notable for the following components:      Result Value   Glucose, Bld 142 (*)    Creatinine, Ser 1.58 (*)    Calcium 8.7 (*)    GFR calc non Af Amer 43 (*)    GFR calc Af Amer 49 (*)    All other components within normal limits  CBC - Abnormal; Notable for the following components:   Hemoglobin 8.6 (*)    HCT 32.4 (*)    MCV 75.7 (*)    MCH 20.1 (*)    MCHC 26.5 (*)    RDW 20.2 (*)    All other components within normal limits  I-STAT TROPONIN, ED    EKG EKG Interpretation  Date/Time:  Thursday September 21 2018 12:17:10 EDT Ventricular Rate:  61 PR Interval:    QRS Duration: 123 QT Interval:  441 QTC Calculation: 445 R Axis:   47 Text Interpretation:  Sinus rhythm Left bundle branch block nonspecific lateral T wave changes Confirmed by Azalia Bilis ([id number]) on 09/21/2018 12:45:03 PM   Radiology Dg Chest 2 View  Result Date: 09/21/2018 CLINICAL DATA:  Mid chest pressure with episode of nausea and vomiting. Some improvement following 1 nitroglycerin. History of coronary artery disease, cardiomyopathy, previous MI, cardiac dysrhythmias. EXAM: CHEST - 2 VIEW COMPARISON:  Portable chest x-ray of July 21, 2018 FINDINGS: The lungs are well-expanded. The pulmonary vascularity is mildly engorged. The interstitial markings are mildly increased. The cardiac silhouette remains enlarged. The patient has undergone previous median sternotomy. There is no pleural effusion. The  ICD is in stable position. IMPRESSION: Mild CHF.  No alveolar pneumonia. Electronically Signed   By: David  [place] M.D.   On: 09/21/2018 13:07    Procedures Procedures (including critical care time)  Medications Ordered in ED Medications - No data to display   Initial Impression / Assessment and Plan / ED Course  I have reviewed the triage vital signs and the nursing notes.  Pertinent labs & imaging results that were available during my care of the patient were reviewed by me and considered in my medical decision making (see chart for details).     Patient is a symptomatic at this time.  No chest discomfort or shortness of breath.  EKG without ischemic changes.  Troponin is negative.  CHF reported on his chest x-ray although it is very mild on my interpretation.  The patient reports no shortness of breath at this time.  He is not on oxygen.  He feels much better.  He would like to go home.  I think this is reasonable.  Patient is encouraged to return to the emergency department for new or worsening symptoms.  All questions answered.  Close primary care follow-up.  Final Clinical Impressions(s) / ED Diagnoses   Final diagnoses:  Precordial chest pain    ED Discharge Orders    None       Azalia Bilis, MD 09/21/18 1417

## 2018-09-21 NOTE — ED Triage Notes (Signed)
Pt here via [facility] from [facility]. On the way to clinic he started having central chest pressure and nausea/viomiting x 1. Pt took 1 nitro and started feeling better at clinic.  Pt has defibrillator.  Pt feels like he's SOB so EMS placed pt on 3L (does not wear any O2 at home). Pt denies any chest discomfort at this time nut feels "achey all over".

## 2018-09-21 NOTE — ED Notes (Signed)
Pt placed on 2L [place] for comfort.

## 2018-09-21 NOTE — ED Notes (Signed)
Patient transported to X-ray 

## 2018-09-21 NOTE — ED Notes (Signed)
Discharge instructions discussed with Pt. Pt verbalized understanding. Pt stable and leaving via WC.    

## 2018-09-25 ENCOUNTER — Encounter: Payer: Self-pay | Admitting: Cardiology

## 2018-09-25 ENCOUNTER — Ambulatory Visit (INDEPENDENT_AMBULATORY_CARE_PROVIDER_SITE_OTHER): Payer: Medicare Other | Admitting: Cardiology

## 2018-09-25 VITALS — BP 114/60 | HR 60 | Ht 79.5 in | Wt >= 6400 oz

## 2018-09-25 DIAGNOSIS — I2109 ST elevation (STEMI) myocardial infarction involving other coronary artery of anterior wall: Secondary | ICD-10-CM | POA: Diagnosis not present

## 2018-09-25 DIAGNOSIS — Z9861 Coronary angioplasty status: Secondary | ICD-10-CM

## 2018-09-25 DIAGNOSIS — I255 Ischemic cardiomyopathy: Secondary | ICD-10-CM

## 2018-09-25 DIAGNOSIS — I25708 Atherosclerosis of coronary artery bypass graft(s), unspecified, with other forms of angina pectoris: Secondary | ICD-10-CM

## 2018-09-25 DIAGNOSIS — N183 Chronic kidney disease, stage 3 unspecified: Secondary | ICD-10-CM

## 2018-09-25 DIAGNOSIS — I10 Essential (primary) hypertension: Secondary | ICD-10-CM

## 2018-09-25 DIAGNOSIS — I5043 Acute on chronic combined systolic (congestive) and diastolic (congestive) heart failure: Secondary | ICD-10-CM

## 2018-09-25 DIAGNOSIS — I2510 Atherosclerotic heart disease of native coronary artery without angina pectoris: Secondary | ICD-10-CM

## 2018-09-25 MED ORDER — ISOSORBIDE MONONITRATE ER 30 MG PO TB24: 30.0000 mg | ORAL_TABLET | Freq: Every day | ORAL | 3 refills | Status: DC

## 2018-09-25 NOTE — Patient Instructions (Addendum)
Medication Instructions:  Your physician recommends that you continue on your current medications as directed. Please refer to the Current Medication list given to you today.  If you need a refill on your cardiac medications before your next appointment, please call your pharmacy.   Lab work: NONE  If you have labs (blood work) drawn today and your tests are completely normal, you will receive your results only by: Marland Kitchen MyChart Message (if you have MyChart) OR . A paper copy in the mail If you have any lab test that is abnormal or we need to change your treatment, we will call you to review the results.  Testing/Procedures: NONE  Follow-Up: At [facility], you and your health needs are our priority.  As part of our continuing mission to provide you with exceptional heart care, we have created designated Provider Care Teams.  These Care Teams include your primary Cardiologist (physician) and Advanced Practice Providers (APPs -  Physician Assistants and Nurse Practitioners) who all work together to provide you with the care you need, when you need it. You may keep your scheduled follow-up appointment. You will see Thurmon Fair, MD on 11/24/18 at 9 AM or one of the following Advanced Practice Providers on your designated Care Team: [place], [place] . Micah Flesher, PA-C  You have been referred to the Heart Failure Clinic. Please schedule an appointment for NEXT WEEK.  Any Other Special Instructions Will Be Listed Below (If Applicable). NONE

## 2018-09-25 NOTE — Progress Notes (Signed)
09/25/2018 [name]   26-Feb-1946  [id number]  Primary Physician Devra Dopp, MD Primary Cardiologist: Dr Royann Shivers  HPI:  72 y/o morbidly obese AA male with a history of severe ICM-15-20% by echo March 2018, morbid obesity, wheel chair bound with untreated sleep apnea, H/O VT-s/p ICD, PAF-on Eliquis and AMIO, IDDM, CRI-3, and CAD-CABG '95 with multiple PCI's since. His last PCI was 07/24/18 -SVG-Dx. The SVG-OM and SVG to RCA are occluded. The LIMA to LAD is patent but the distal LAD is occluded. He was seen in Sept and was volume overloaded secondary to taking the wrong dose of Lasix. He improved with increasing the dose. He was seen in the ED 09/21/18 after he had an episode of chest pain that in retrospect he thinks was a "panic attack".   He is in the office today for follow up. He says he feels "miserable". Mainly weak and DOE. He thinks he has never recovered from his last PCI. I added Imdur in Sept and he indicates no change in his symptoms with that. He also had an episode where he fell at home and developed a hematoma above his ICD site. The hematoma has migrated to his Lt breast. He was noted to be anemic when seen in the ED and I suspect it was from this.    Current Outpatient Medications  Medication Sig Dispense Refill  . acetaminophen (TYLENOL) 325 MG tablet Take 1-2 tablets (325-650 mg total) by mouth every 4 (four) hours as needed for mild pain.    Marland Kitchen allopurinol (ZYLOPRIM) 100 MG tablet Take 100 mg by mouth daily.    Marland Kitchen amiodarone (PACERONE) 200 MG tablet Take 1 tablet (200 mg total) by mouth daily. 90 tablet 3  . atorvastatin (LIPITOR) 80 MG tablet TAKE 1 TABLET BY MOUTH ONCE DAILY (Patient taking differently: Take 80 mg by mouth daily. ) 90 tablet 0  . cholecalciferol (VITAMIN D) 1000 UNITS tablet Take 1,000 Units by mouth every other day.     . clopidogrel (PLAVIX) 75 MG tablet TAKE 1 TABLET BY MOUTH DAILY (Patient taking differently: Take 75 mg by mouth daily. ) 90  tablet 3  . ELIQUIS 5 MG TABS tablet take 1 tablet by mouth twice a day 60 tablet 1  . ENTRESTO 24-26 MG TAKE 1 TABLET BY MOUTH TWICE DAILY (Patient taking differently: Take 1 tablet by mouth 2 (two) times daily. ) 180 tablet 1  . furosemide (LASIX) 40 MG tablet TAKE 2 TABLETS(80 MG) BY MOUTH TWICE DAILY (Patient taking differently: Take 40 mg by mouth 2 (two) times daily. ) 360 tablet 0  . Hyaluronan (ORTHOVISC) 30 MG/2ML SOSY Inject 30 mg into the articular space daily. Every six months. Last dose was in August 2019    . insulin glargine (LANTUS) 100 UNIT/ML injection Inject 0.5 mLs (50 Units total) into the skin every morning. 10 mL 11  . insulin lispro (HUMALOG KWIKPEN) 100 UNIT/ML KiwkPen Inject 20 Units into the skin every morning.     . INVOKANA 100 MG TABS Take 100 mg by mouth daily.    . metoprolol succinate (TOPROL-XL) 50 MG 24 hr tablet Take 1 tablet (50 mg total) by mouth daily. NEED OV. 90 tablet 2  . nitroGLYCERIN (NITROSTAT) 0.4 MG SL tablet PLACE 1 TABLET UNDER THE TONGUE AS NEEDED EVERY 5 MINUTES FOR CHEST PAIN 25 tablet 6  . Omega-3 Fatty Acids (FISH OIL) 1200 MG CAPS Take 1,200 mg by mouth daily.     Marland Kitchen  sertraline (ZOLOFT) 50 MG tablet Take 1 tablet (50 mg total) by mouth daily. 90 tablet 0  . sertraline (ZOLOFT) 50 MG tablet TAKE 1 TABLET BY MOUTH EVERY DAY (Patient taking differently: Take 50 mg by mouth daily. ) 90 tablet 3  . VICTOZA 18 MG/3ML SOPN Inject 1.2 mg into the skin daily.  0  . isosorbide mononitrate (IMDUR) 30 MG 24 hr tablet Take 1 tablet (30 mg total) by mouth daily. 30 tablet 3   No current facility-administered medications for this visit.     Allergies  Allergen Reactions  . Coreg [Carvedilol] Nausea And Vomiting    Past Medical History:  Diagnosis Date  . CAD (coronary artery disease)    a. CABG 1995 and multiple stents. b. Known graft occlusions - NSTEMI 06/2016 s/p DES to distal SVG-diag.  . Chronic systolic CHF (congestive heart failure) (HCC)   .  CKD (chronic kidney disease), stage III (HCC)   . Dietary noncompliance   . DM (diabetes mellitus) (HCC)   . DM type 2 causing CKD stage 3 (HCC)   . Dyslipidemia - low HDL and high triglycerides   . Gout   . Ischemic cardiomyopathy    a. LVEF 10-15% by cath 06/2016.  Marland Kitchen Leukocytosis   . Morbid obesity (HCC)   . Myocardial infarction (HCC)   . NSTEMI (non-ST elevated myocardial infarction) (HCC) 06/26/2016  . PAF (paroxysmal atrial fibrillation) (HCC)   . PONV (postoperative nausea and vomiting)   . Systemic hypertension   . Unstable angina (HCC) 07/21/2018    Social History   Socioeconomic History  . Marital status: Married    Spouse name: Not on file  . Number of children: Not on file  . Years of education: Not on file  . Highest education level: Not on file  Occupational History  . Not on file  Social Needs  . Financial resource strain: Not on file  . Food insecurity:    Worry: Not on file    Inability: Not on file  . Transportation needs:    Medical: Not on file    Non-medical: Not on file  Tobacco Use  . Smoking status: Former Smoker    Types: Cigarettes    Last attempt to quit: 03/10/2017    Years since quitting: 1.5  . Smokeless tobacco: Never Used  . Tobacco comment: "1-2 cigarettes per day"  Substance and Sexual Activity  . Alcohol use: Not Currently    Alcohol/week: 0.0 standard drinks    Comment: rare  . Drug use: No  . Sexual activity: Not on file  Lifestyle  . Physical activity:    Days per week: Not on file    Minutes per session: Not on file  . Stress: Not on file  Relationships  . Social connections:    Talks on phone: Not on file    Gets together: Not on file    Attends religious service: Not on file    Active member of club or organization: Not on file    Attends meetings of clubs or organizations: Not on file    Relationship status: Not on file  . Intimate partner violence:    Fear of current or ex partner: Not on file    Emotionally abused:  Not on file    Physically abused: Not on file    Forced sexual activity: Not on file  Other Topics Concern  . Not on file  Social History Narrative  . Not on file  Family History  Problem Relation Age of Onset  . Heart failure Mother   . Dementia Mother   . Hearing loss Other   . Hypertension Other      Review of Systems: General: negative for chills, fever, night sweats or weight changes.  Cardiovascular: negative for chest pain, edema, orthopnea, palpitations, paroxysmal nocturnal dyspnea  Dermatological: negative for rash Respiratory: negative for cough or wheezing Urologic: negative for hematuria Abdominal: negative for nausea, vomiting, diarrhea, bright red blood per rectum, melena, or hematemesis Neurologic: negative for visual changes, syncope, or dizziness All other systems reviewed and are otherwise negative except as noted above.    Pulse 60, height 6' 7.5" (2.019 m), SpO2 (!) 88 %.  General appearance: alert, cooperative, no distress, morbidly obese and in wheel chair on O2 Lungs: decreased breath sounds 1/3 up on Rt Heart: regular rate and rhythm Extremities: no edema Skin: cool and dry Neurologic: Grossly normal  EKG Paced  ASSESSMENT AND PLAN:  Acute on chronic combined systolic (congestive) and diastolic (congestive) heart failure (HCC) He appears to be volume stable though its difficult to tell secondary to obesity  Non-STEMI (non-ST elevated myocardial infarction) (HCC) Post NSTEMI 07/21/18  CAD S/P percutaneous coronary angioplasty SVG-Dx PCI secondary to ISR 07/25/18  Hx of CABG CABG '95, multiple PCIs since. SVG-RCA and OM occluded. LIMA-LAD patent but distal LAD occluded. SVG-Dx PCI July 17 with ISR 07/26/18-PCI  Ischemic cardiomyopathy EF 15-20% echo March 2018  CKD (chronic kidney disease), stage III (HCC) Last SCr 1.58 on 09/21/18, GFR 49  Morbid obesity (HCC) BMI 47. Debilitated in wheel chair. He has sleep apnea- not on C-pap, on  chronic O2  AICD (automatic cardioverter/defibrillator) present ICD in place- h/o VT. Recent fall-hematoma appears stable presently    PLAN  I told Mr Tedesco I didn't have a lot to offer him as an OP and suggested we might want to admit him for Rt heart cath and formal CHF evaluation. It's my opinion that obesity and hypoventilation are contributing significantly to his symptoms.  Mr Grandstaff declined admission. I suggested CHF consult and he is agreeable to that. I did not change his medications or order labs (just done 10/3).   Corine Shelter PA-C 09/25/2018 9:28 AM

## 2018-09-27 ENCOUNTER — Telehealth: Payer: Self-pay | Admitting: Cardiology

## 2018-09-27 NOTE — Telephone Encounter (Signed)
Received call from patient.He stated he saw Corine Shelter PA this past Friday.Stated Franky Macho wanted to admit him and he refused.Stated he feels bad today.Sob hard to walk from room to room.Stated he wants to be admitted to hospital.Advised patient to go to ED.Trish called.

## 2018-10-01 NOTE — Progress Notes (Signed)
He never showed up at [facility] ED. He will often go to [facility] ED, but I see no record in CareEverywhere. Any idea what happened?

## 2018-10-02 ENCOUNTER — Ambulatory Visit (INDEPENDENT_AMBULATORY_CARE_PROVIDER_SITE_OTHER): Payer: Medicare Other

## 2018-10-02 DIAGNOSIS — Z95810 Presence of automatic (implantable) cardiac defibrillator: Secondary | ICD-10-CM

## 2018-10-02 DIAGNOSIS — I5022 Chronic systolic (congestive) heart failure: Secondary | ICD-10-CM

## 2018-10-02 NOTE — Progress Notes (Signed)
EPIC Encounter for ICM Monitoring  Patient Name: [name] is a 72 y.o. male Date: 10/02/2018 Primary Care Physican: Helane Rima, MD Primary Cardiologist: Croitoru Electrophysiologist: Caryl Comes Dry Weight: 428lbs (10/7/2019officeweight- no scales at home)    Clinical Status (22-Sep-2018 to 02-Oct-2018)  Treated VT/VF 0 episodes   AT/AF 9 episodes   Time in AT/AF 1.9 hr/day (8.0%)  Observations (2) (22-Sep-2018 to 02-Oct-2018)  AT/AF >= 6 hr for 2 days.  Patient Activity less than 1 hr/day for 1 weeks.      Heart Failure questions reviewed, pt is feeling a little better since 09/25/2018 office visit.  Weakness has improved.  He is still not sure why he is retaining fluid.  He has been referred to [facility] clinic by Kerin Ransom, PA at 09/25/18 visit.   Thoracic impedance abnormal suggesting fluid accumulation since June 2019.   Prescribed: Furosemide 40 mg 2 tablets (80 mg total) twice a day.    Labs: 08/25/2018 Creatinine 1.70, BUN 22, Potassium 4.2, Sodium 141, EGFR 40-46 07/26/2018 Creatinine1.47, BUN15, Potassium3.7, [id number], VAPO14-10 07/25/2018 Creatinine1.58, BUN19, Potassium3.6, [id number], [id number]  07/22/2018 Creatinine1.64, BUN16, Potassium3.6, ASUORV615, PPHK32-76 07/21/2018 Creatinine1.61, BUN13, Potassium4.2, Sodium141, [id number]  06/08/2018 Creatinine1.69, BUN16, Potassium4.4, Sodium141, [id number]  04/20/2018 Creatinine1.71, BUN15, Potassium4.5, [id number], [id number] A complete set of results can be found in Results Review.  Recommendations: Reminded to limit salt intake.  Will send copy to Dr Sallyanne Kuster and Dr Caryl Comes for review and if any recommendations will call back.    Follow-up plan: ICM clinic phone appointment on 10/23/2018.   Office appointment scheduled 11/24/2018 with Dr. Sallyanne Kuster.    Copy of ICM check sent to Dr. Caryl Comes and Dr Sallyanne Kuster for review and recommendations if needed.   3 month ICM trend:  10/02/2018    1 Year ICM trend:       Rosalene Billings, RN 10/02/2018 12:15 PM

## 2018-10-10 ENCOUNTER — Other Ambulatory Visit (HOSPITAL_COMMUNITY): Payer: Self-pay | Admitting: *Deleted

## 2018-10-10 ENCOUNTER — Other Ambulatory Visit (HOSPITAL_COMMUNITY): Payer: Self-pay

## 2018-10-11 ENCOUNTER — Encounter (HOSPITAL_COMMUNITY)
Admission: RE | Admit: 2018-10-11 | Discharge: 2018-10-11 | Disposition: A | Discharge status: Home / Self Care | Payer: Medicare Other | Source: Ambulatory Visit | Attending: Nephrology | Admitting: Nephrology

## 2018-10-11 ENCOUNTER — Telehealth (HOSPITAL_COMMUNITY): Payer: Self-pay | Admitting: Adult Health

## 2018-10-11 DIAGNOSIS — N189 Chronic kidney disease, unspecified: Secondary | ICD-10-CM | POA: Insufficient documentation

## 2018-10-11 DIAGNOSIS — D631 Anemia in chronic kidney disease: Secondary | ICD-10-CM | POA: Diagnosis present

## 2018-10-11 MED ORDER — SODIUM CHLORIDE 0.9 % IV SOLN
510.0000 mg | INTRAVENOUS | Status: DC
  Administered 2018-10-11: 510 mg via INTRAVENOUS
  Filled 2018-10-11: qty 17

## 2018-10-11 NOTE — Telephone Encounter (Signed)
Patient's wife, Jasmine December, returned my call.  She is aware of appt 10/25/18.  New patient packet mailed.

## 2018-10-11 NOTE — Telephone Encounter (Signed)
Called and left message for patient to call back.  Need to give pt New CHF appt with APP side of Clinic.

## 2018-10-11 NOTE — Discharge Instructions (Signed)

## 2018-10-17 NOTE — Progress Notes (Addendum)
Advanced Heart Failure Clinic Note   Referring Physician: Corine Shelter, PA PCP: Devra Dopp, MD PCP-Cardiologist: Thurmon Fair, MD   HPI: [name] is a 72 y.o. male with a history of morbid obesity, chronic systolic HF due to ICM (EF 15-20%), OSA not on CPAP, VT s/p MDT ICD, PAF, DM, CAD s/p CABG 1995 and multiple PCIs (most recent 07/24/18), PTSD. He has been referred to the HF clinic by Corine Shelter PA-C for further evaluation of his HF.   Admitted 8/2-07/26/18 with unstable angina. He had LHC and received DES.   He was seen in [facility] office 09/25/18 and was feeling badly. He was feeling fatigued with SOB on exertion. He was offered admission for RHC, but declined. He was referred to AHF clinic.  He presents today to establish care in the AHF clinic. Overall doing poorly, worse since his last stent. He has been wearing oxygen since September. His wife bought without prescription and has spent $4,000 on it. He is SOB with any movement, none at rest. He has gained 50-60 lbs in the last 6 months. He has bloating, BLE edema, and PND. He goes apneic when sleeping at night, but has not had a sleep study. Denies cough. Sleeps on 2 large pillows at baseline. No CP. Occasional mild dizziness when he stands up. He has severe anxiety when going to doctor's appointments and has had panic attacks in the past. He feels very SOB if O2 is taken off. Poor appetite and energy level. Wife cooks his meals and limits salt. He drinks 3 L of water and a gallon of lemonade daily. Weights 387 > 433 lbs over the last several months. Taking all medications.   Optivol did not come through today.   LHC 07/24/18:  Prox RCA lesion is 100% stenosed.  Ost LAD to Prox LAD lesion is 100% stenosed.  Ost Cx to Prox Cx lesion is 100% stenosed.  Origin lesion is 100% stenosed.  Origin to Prox Graft lesion is 100% stenosed.  Mid Graft to Insertion lesion is 95% stenosed.  Mid LAD to Dist LAD lesion is 100%  stenosed. LHC 07/25/18: DES dist graft to insertion lesion  No RHC on file.   Echo 03/14/17: EF 15-20%, grade 3 DD, RV not visualized  SH: Former smoker. Quit 1.5 years ago. Used to smoke 2 cigarettes/day. No ETOH or drug use. Lives in [place] with his wife. He is a former Secondary school teacher.  FH: Mother with CHF and CAD. Unknown family history on dad's side. No siblings  Review of Systems: [y] = yes, [ ]  = no   General: Weight gain Cove.Etienne ]; Weight loss [ ] ; Anorexia Cove.Etienne ]; Fatigue Cove.Etienne ]; Fever [ ] ; Chills [ ] ; Weakness [ ]   Cardiac: Chest pain/pressure [ ] ; Resting SOB [ ] ; Exertional SOB Cove.Etienne ]; Orthopnea [ y]; Pedal Edema [ y]; Palpitations [ ] ; Syncope [ ] ; Presyncope [ ] ; Paroxysmal nocturnal dyspnea[ ]   Pulmonary: Cough [ ] ; Wheezing[ ] ; Hemoptysis[ ] ; Sputum [ ] ; Snoring [ ]   GI: Vomiting[ ] ; Dysphagia[ ] ; Melena[ ] ; Hematochezia [ ] ; Heartburn[ ] ; Abdominal pain [ ] ; Constipation [ ] ; Diarrhea [ ] ; BRBPR [ ]   GU: Hematuria[ ] ; Dysuria [ ] ; Nocturia[ ]   Vascular: Pain in legs with walking [ ] ; Pain in feet with lying flat [ ] ; Non-healing sores [ ] ; Stroke [ ] ; TIA [ ] ; Slurred speech [ ] ;  Neuro: Headaches[ ] ; Vertigo[ ] ; Seizures[ ] ; Paresthesias[ ] ;Blurred vision [ ] ; Diplopia [ ] ;  Vision changes [ ]   Ortho/Skin: Arthritis [ ] ; Joint pain [ ] ; Muscle pain [ ] ; Joint swelling [ ] ; Back Pain [ ] ; Rash [ ]   Psych: Depression[ ] ; Anxiety[y ]  Heme: Bleeding problems [ ] ; Clotting disorders [ ] ; Anemia [ ]   Endocrine: Diabetes [ y]; Thyroid dysfunction[ ]    Past Medical History:  Diagnosis Date  . CAD (coronary artery disease)    a. CABG 1995 and multiple stents. b. Known graft occlusions - NSTEMI 06/2016 s/p DES to distal SVG-diag.  . Chronic systolic CHF (congestive heart failure) (HCC)   . CKD (chronic kidney disease), stage III (HCC)   . Dietary noncompliance   . DM (diabetes mellitus) (HCC)   . DM type 2 causing CKD stage 3 (HCC)   . Dyslipidemia - low HDL and high  triglycerides   . Gout   . Ischemic cardiomyopathy    a. LVEF 10-15% by cath 06/2016.  Marland Kitchen Leukocytosis   . Morbid obesity (HCC)   . Myocardial infarction (HCC)   . NSTEMI (non-ST elevated myocardial infarction) (HCC) 06/26/2016  . PAF (paroxysmal atrial fibrillation) (HCC)   . PONV (postoperative nausea and vomiting)   . Systemic hypertension   . Unstable angina (HCC) 07/21/2018    Current Outpatient Medications  Medication Sig Dispense Refill  . acetaminophen (TYLENOL) 325 MG tablet Take 1-2 tablets (325-650 mg total) by mouth every 4 (four) hours as needed for mild pain.    Marland Kitchen allopurinol (ZYLOPRIM) 100 MG tablet Take 100 mg by mouth daily.    Marland Kitchen amiodarone (PACERONE) 200 MG tablet Take 1 tablet (200 mg total) by mouth daily. 90 tablet 3  . atorvastatin (LIPITOR) 80 MG tablet TAKE 1 TABLET BY MOUTH ONCE DAILY 90 tablet 0  . cholecalciferol (VITAMIN D) 1000 UNITS tablet Take 1,000 Units by mouth every other day.     . clopidogrel (PLAVIX) 75 MG tablet TAKE 1 TABLET BY MOUTH DAILY 90 tablet 3  . ELIQUIS 5 MG TABS tablet take 1 tablet by mouth twice a day 60 tablet 1  . ENTRESTO 24-26 MG TAKE 1 TABLET BY MOUTH TWICE DAILY 180 tablet 1  . furosemide (LASIX) 40 MG tablet TAKE 2 TABLETS(80 MG) BY MOUTH TWICE DAILY 360 tablet 0  . Hyaluronan (ORTHOVISC) 30 MG/2ML SOSY Inject 30 mg into the articular space daily. Every six months. Last dose was in August 2019    . insulin glargine (LANTUS) 100 UNIT/ML injection Inject 0.5 mLs (50 Units total) into the skin every morning. 10 mL 11  . insulin lispro (HUMALOG KWIKPEN) 100 UNIT/ML KiwkPen Inject 20 Units into the skin every morning.     . INVOKANA 100 MG TABS Take 100 mg by mouth daily.    . isosorbide mononitrate (IMDUR) 30 MG 24 hr tablet Take 1 tablet (30 mg total) by mouth daily. 90 tablet 3  . metoprolol succinate (TOPROL-XL) 50 MG 24 hr tablet Take 1 tablet (50 mg total) by mouth daily. NEED OV. 90 tablet 2  . nitroGLYCERIN (NITROSTAT) 0.4 MG SL  tablet PLACE 1 TABLET UNDER THE TONGUE AS NEEDED EVERY 5 MINUTES FOR CHEST PAIN 25 tablet 6  . Omega-3 Fatty Acids (FISH OIL) 1200 MG CAPS Take 1,200 mg by mouth daily.     . sertraline (ZOLOFT) 50 MG tablet Take 1 tablet (50 mg total) by mouth daily. 90 tablet 0  . VICTOZA 18 MG/3ML SOPN Inject 1.2 mg into the skin daily.  0   No current facility-administered medications  for this encounter.     Allergies  Allergen Reactions  . Coreg [Carvedilol] Nausea And Vomiting      Social History   Socioeconomic History  . Marital status: Married    Spouse name: Not on file  . Number of children: Not on file  . Years of education: Not on file  . Highest education level: Not on file  Occupational History  . Not on file  Social Needs  . Financial resource strain: Not on file  . Food insecurity:    Worry: Not on file    Inability: Not on file  . Transportation needs:    Medical: Not on file    Non-medical: Not on file  Tobacco Use  . Smoking status: Former Smoker    Types: Cigarettes    Last attempt to quit: 03/10/2017    Years since quitting: 1.6  . Smokeless tobacco: Never Used  . Tobacco comment: "1-2 cigarettes per day"  Substance and Sexual Activity  . Alcohol use: Not Currently    Alcohol/week: 0.0 standard drinks    Comment: rare  . Drug use: No  . Sexual activity: Not on file  Lifestyle  . Physical activity:    Days per week: Not on file    Minutes per session: Not on file  . Stress: Not on file  Relationships  . Social connections:    Talks on phone: Not on file    Gets together: Not on file    Attends religious service: Not on file    Active member of club or organization: Not on file    Attends meetings of clubs or organizations: Not on file    Relationship status: Not on file  . Intimate partner violence:    Fear of current or ex partner: Not on file    Emotionally abused: Not on file    Physically abused: Not on file    Forced sexual activity: Not on file   Other Topics Concern  . Not on file  Social History Narrative  . Not on file      Family History  Problem Relation Age of Onset  . Heart failure Mother   . Dementia Mother   . Hearing loss Other   . Hypertension Other     Vitals:   10/25/18 1415  BP: 128/70  Pulse: 74  SpO2: 90%  Weight: (!) 196.4 kg (433 lb)    Wt Readings from Last 3 Encounters:  10/25/18 (!) 196.4 kg (433 lb)  10/18/18 (!) 175.5 kg (387 lb)  10/11/18 (!) 175.5 kg (387 lb)    PHYSICAL EXAM: General:  Appears fatigued. In wheelchair. No respiratory difficulty Wearing O2 HEENT: normal Neck: supple. JVP difficult due to habitus. Carotids 2+ bilat; no bruits. No lymphadenopathy or thyromegaly appreciated. Cor: Very large chest. PMI nonpalpable. Regular rate & rhythm. No rubs, gallops or murmurs. Lungs: distant breath sounds. On 2 L O2 Abdomen: obese soft, nontender, ++distended. Unable to assess  For hepatosplenomegaly. No bruits or masses. Good bowel sounds. Extremities: no cyanosis, clubbing, rash, 1-2+ edema Neuro: alert & oriented x 3, cranial nerves grossly intact. moves all 4 extremities w/o difficulty. Affect pleasant.  ECG: A paced 61 bpm with occasional PVCs. Incomplete LBBB (QRS 116 ms) Looks similar to previous. Personally reviewed.    ASSESSMENT & PLAN:  1. Chronic systolic HF due to ICM. Has MDT ICD - Echo 03/14/17: EF 15-20%, grade 3 DD, RV not visualized - NYHA IIIb - Volume status markedly elevated -  Stop lasix (was taking 120 mg am, 80 mg pm). Start torsemide 60 mg BID - Continue Entresto 24/26 mg BID - Continue Toprol XL 50 mg daily - Consider spiro next visit - Discussed limiting fluid and salt intake.  - Repeat echo - May need RHC  2. CAD s/p CABG. Most recent PCI 07/2018 - Continue plavix and eliquis. Will need to continue plavix for 12 months per Dr Eldridge Dace - Continue statin - No s/s ischemia  3. Hx of VT - Has MDT ICD  4. PAF - Continue amio 200 mg daily -  Continue Eliquis 5 mg BID - NSR on EKG today  5. Snoring and apnea at night - Refer to pulmonary for BiPAP  6. DM - Per PCP. On invokana, victoza, and insulin  7. CKD 3 - Creatinine 1.58 09/21/18 - BMET today  8. Obesity Body mass index is 48.17 kg/m.  9. Hypoxia - Qualified for O2. Will order home O2.   DC lasix. Start torsemide 60 mg BID Refer to pulmonary for BiPAP Set up home O2 BMET, BNP, TSH today Repeat echo Follow up in 2-3 weeks  Alford Highland, NP 10/25/18  Greater than 50% of the 25 minute visit was spent in counseling/coordination of care regarding disease state education, salt/fluid restriction, sliding scale diuretics, and medication compliance.  Mr. Shapley is a 72 y/o morbidly obese male with CKD 3, DM2, CAD s/p CABG and multiple stents and systolic HF due to iCM with EF 15-20%. Currently NYHA IIIb symptoms. He has marked volume overload on exam and I suspect he also has severe OSA/OHS that has been undiagnosed. He is hypoxic with minimal exertion here and we will arrange for home O2. He appears not to be responding to po lasix so will switch to torsemide. Long discussion about need to monitor (and reduce) fluid intake. Wil have him see Pulmonary ASAP for sleep eval and probable BiPAP. Will continue to titrate HF meds including adding Jardiance. If fluid does not come off well will need admit for diuresis +/- RHC.   Arvilla Meres, MD  8:43 PM

## 2018-10-18 ENCOUNTER — Encounter (HOSPITAL_COMMUNITY)
Admission: RE | Admit: 2018-10-18 | Discharge: 2018-10-18 | Disposition: A | Discharge status: Home / Self Care | Payer: Medicare Other | Source: Ambulatory Visit | Attending: Nephrology | Admitting: Nephrology

## 2018-10-18 DIAGNOSIS — N189 Chronic kidney disease, unspecified: Secondary | ICD-10-CM | POA: Diagnosis not present

## 2018-10-18 MED ORDER — SODIUM CHLORIDE 0.9 % IV SOLN
510.0000 mg | INTRAVENOUS | Status: DC
  Administered 2018-10-18: 510 mg via INTRAVENOUS
  Filled 2018-10-18: qty 17

## 2018-10-23 ENCOUNTER — Ambulatory Visit (INDEPENDENT_AMBULATORY_CARE_PROVIDER_SITE_OTHER): Payer: Medicare Other

## 2018-10-23 DIAGNOSIS — Z95810 Presence of automatic (implantable) cardiac defibrillator: Secondary | ICD-10-CM

## 2018-10-23 DIAGNOSIS — I5022 Chronic systolic (congestive) heart failure: Secondary | ICD-10-CM

## 2018-10-23 NOTE — Progress Notes (Signed)
EPIC Encounter for ICM Monitoring  Patient Name: [name] is a 72 y.o. male Date: 10/23/2018 Primary Care Physican: Helane Rima, MD Primary Cardiologist: Croitoru Electrophysiologist: Faustino Congress Weight: 428lbs (10/7/2019officeweight- no scales at home)  Clinical Status (02-Oct-2018 to 23-Oct-2018)  Treated VT/VF 0  AT/AF 47 episodes   Time in AT/AF 1.0 hr/day (4.0%)  Longest AT/AF 8 hours  Observations (2) (02-Oct-2018 to 23-Oct-2018)  AT/AF >= 6 hr for 1 days.  Patient Activity less than 1 hr/day for 3 weeks.      Attempted call to patient and unable to reach.  Left detailed message, per DPR, regarding transmission.  Transmission reviewed.    Thoracic impedance abnormal suggesting fluid accumulation since June 2019  Prescribed: Furosemide 40 mg 2 tablets (80 mg total) twice a day.    Labs: 08/25/2018 Creatinine 1.70, BUN 22, Potassium 4.2, Sodium 141, EGFR 40-46 07/26/2018 Creatinine1.47, BUN15, Potassium3.7, [id number], [id number] 07/25/2018 Creatinine1.58, BUN19, Potassium3.6, IYMEBR830, NMMH68-08  07/22/2018 Creatinine1.64, BUN16, Potassium3.6, UPJSRP594, VOPF29-24 07/21/2018 Creatinine1.61, BUN13, Potassium4.2, Sodium141, [id number]  06/08/2018 Creatinine1.69, BUN16, Potassium4.4, Sodium141, RRNH65-79  04/20/2018 Creatinine1.71, BUN15, Potassium4.5, [id number], NVBT66-06 A complete set of results can be found in Results Review.  Recommendations: Left voice mail with ICM number and encouraged to call if experiencing any fluid symptoms.  Follow-up plan: ICM clinic phone appointment on 11/08/2018 to recheck fluid levels.  Office appointment scheduled 10/25/2018 with HF Clinic NP/PA (1st visit).    Copy of ICM check sent to Dr. Caryl Comes and Dr Jacquiline Doe.   3 month ICM trend: 10/23/2018    AT/AF     1 Year ICM trend:             Rosalene Billings, RN 10/23/2018 12:42 PM

## 2018-10-24 ENCOUNTER — Telehealth: Payer: Self-pay

## 2018-10-24 NOTE — Telephone Encounter (Signed)
Remote ICM transmission received.  Attempted call to patient regarding ICM remote transmission and left detailed message, per DPR, with next ICM remote transmission date of 11/08/2018.  Advised to return call for any fluid symptoms or questions.

## 2018-10-24 NOTE — Progress Notes (Signed)
Still wet. He has a CHF clinic appt tomorrow MCr

## 2018-10-25 ENCOUNTER — Other Ambulatory Visit: Payer: Self-pay

## 2018-10-25 ENCOUNTER — Ambulatory Visit (HOSPITAL_COMMUNITY)
Admission: RE | Admit: 2018-10-25 | Discharge: 2018-10-25 | Disposition: A | Discharge status: Home / Self Care | Payer: Medicare Other | Source: Ambulatory Visit | Attending: Internal Medicine | Admitting: Internal Medicine

## 2018-10-25 ENCOUNTER — Encounter (HOSPITAL_COMMUNITY): Payer: Self-pay

## 2018-10-25 VITALS — BP 128/70 | HR 74 | Wt >= 6400 oz

## 2018-10-25 DIAGNOSIS — I480 Paroxysmal atrial fibrillation: Secondary | ICD-10-CM | POA: Insufficient documentation

## 2018-10-25 DIAGNOSIS — I255 Ischemic cardiomyopathy: Secondary | ICD-10-CM | POA: Insufficient documentation

## 2018-10-25 DIAGNOSIS — R0902 Hypoxemia: Secondary | ICD-10-CM | POA: Diagnosis not present

## 2018-10-25 DIAGNOSIS — N183 Chronic kidney disease, stage 3 unspecified: Secondary | ICD-10-CM

## 2018-10-25 DIAGNOSIS — I2510 Atherosclerotic heart disease of native coronary artery without angina pectoris: Secondary | ICD-10-CM

## 2018-10-25 DIAGNOSIS — I25708 Atherosclerosis of coronary artery bypass graft(s), unspecified, with other forms of angina pectoris: Secondary | ICD-10-CM

## 2018-10-25 DIAGNOSIS — I5022 Chronic systolic (congestive) heart failure: Secondary | ICD-10-CM | POA: Insufficient documentation

## 2018-10-25 DIAGNOSIS — I252 Old myocardial infarction: Secondary | ICD-10-CM | POA: Diagnosis not present

## 2018-10-25 DIAGNOSIS — Z9861 Coronary angioplasty status: Secondary | ICD-10-CM

## 2018-10-25 DIAGNOSIS — Z794 Long term (current) use of insulin: Secondary | ICD-10-CM | POA: Insufficient documentation

## 2018-10-25 DIAGNOSIS — G4733 Obstructive sleep apnea (adult) (pediatric): Secondary | ICD-10-CM | POA: Insufficient documentation

## 2018-10-25 DIAGNOSIS — E1122 Type 2 diabetes mellitus with diabetic chronic kidney disease: Secondary | ICD-10-CM | POA: Insufficient documentation

## 2018-10-25 DIAGNOSIS — I5042 Chronic combined systolic (congestive) and diastolic (congestive) heart failure: Secondary | ICD-10-CM

## 2018-10-25 DIAGNOSIS — I10 Essential (primary) hypertension: Secondary | ICD-10-CM | POA: Diagnosis not present

## 2018-10-25 DIAGNOSIS — Z7901 Long term (current) use of anticoagulants: Secondary | ICD-10-CM | POA: Diagnosis not present

## 2018-10-25 DIAGNOSIS — Z6841 Body Mass Index (BMI) 40.0 and over, adult: Secondary | ICD-10-CM | POA: Diagnosis not present

## 2018-10-25 DIAGNOSIS — Z951 Presence of aortocoronary bypass graft: Secondary | ICD-10-CM | POA: Insufficient documentation

## 2018-10-25 DIAGNOSIS — E785 Hyperlipidemia, unspecified: Secondary | ICD-10-CM | POA: Diagnosis not present

## 2018-10-25 DIAGNOSIS — F419 Anxiety disorder, unspecified: Secondary | ICD-10-CM | POA: Insufficient documentation

## 2018-10-25 DIAGNOSIS — Z79899 Other long term (current) drug therapy: Secondary | ICD-10-CM | POA: Diagnosis not present

## 2018-10-25 DIAGNOSIS — Z95810 Presence of automatic (implantable) cardiac defibrillator: Secondary | ICD-10-CM | POA: Diagnosis not present

## 2018-10-25 DIAGNOSIS — Z8249 Family history of ischemic heart disease and other diseases of the circulatory system: Secondary | ICD-10-CM | POA: Diagnosis not present

## 2018-10-25 DIAGNOSIS — I130 Hypertensive heart and chronic kidney disease with heart failure and stage 1 through stage 4 chronic kidney disease, or unspecified chronic kidney disease: Secondary | ICD-10-CM | POA: Insufficient documentation

## 2018-10-25 DIAGNOSIS — M109 Gout, unspecified: Secondary | ICD-10-CM | POA: Insufficient documentation

## 2018-10-25 DIAGNOSIS — Z955 Presence of coronary angioplasty implant and graft: Secondary | ICD-10-CM | POA: Insufficient documentation

## 2018-10-25 DIAGNOSIS — Z7902 Long term (current) use of antithrombotics/antiplatelets: Secondary | ICD-10-CM | POA: Diagnosis not present

## 2018-10-25 DIAGNOSIS — I25110 Atherosclerotic heart disease of native coronary artery with unstable angina pectoris: Secondary | ICD-10-CM | POA: Insufficient documentation

## 2018-10-25 DIAGNOSIS — Z87891 Personal history of nicotine dependence: Secondary | ICD-10-CM | POA: Insufficient documentation

## 2018-10-25 LAB — BASIC METABOLIC PANEL
Anion gap: 5 (ref 5–15)
BUN: 11 mg/dL (ref 8–23)
CO2: 37 mmol/L — AB (ref 22–32)
CREATININE: 1.54 mg/dL — AB (ref 0.61–1.24)
Calcium: 8.9 mg/dL (ref 8.9–10.3)
Chloride: 98 mmol/L (ref 98–111)
GFR calc Af Amer: 51 mL/min — ABNORMAL LOW (ref >60)
GFR calc non Af Amer: 44 mL/min — ABNORMAL LOW (ref >60)
GLUCOSE: 76 mg/dL (ref 70–99)
Potassium: 3.6 mmol/L (ref 3.5–5.1)
Sodium: 140 mmol/L (ref 135–145)

## 2018-10-25 LAB — TSH: TSH: 4.199 u[IU]/mL (ref 0.350–4.500)

## 2018-10-25 LAB — BRAIN NATRIURETIC PEPTIDE: B Natriuretic Peptide: 1051.3 pg/mL — ABNORMAL HIGH (ref 0.0–100.0)

## 2018-10-25 MED ORDER — TORSEMIDE 20 MG PO TABS: 60.0000 mg | ORAL_TABLET | Freq: Two times a day (BID) | ORAL | 3 refills | Status: DC

## 2018-10-25 NOTE — Progress Notes (Signed)
SATURATION QUALIFICATIONS: (This note is used to comply with regulatory documentation for home oxygen)  Patient Saturations on Room Air at Rest = 99%  Patient Saturations on Room Air while Ambulating = 88%  Patient Saturations on 2 Liters of oxygen while Ambulating = 99%  Please briefly explain why patient needs home oxygen: SOB CHF

## 2018-10-25 NOTE — Patient Instructions (Signed)
DISCONTINUE Lasix.  START Torsemide 60mg  (3 tabs) twice a day  Labs were drawn today. Office will call you if results are abnormal  You have been referred to Pulmonary for an appointment. They will call you to arrange a visit with them.  Your physician has requested that you have an echocardiogram. Echocardiography is a painless test that uses sound waves to create images of your heart. It provides your doctor with information about the size and shape of your heart and how well your heart's chambers and valves are working. This procedure takes approximately one hour. There are no restrictions for this procedure.  Your physician recommends that you schedule a follow-up appointment in: 2-3 weeks with Dr. Gala Romney.

## 2018-10-26 ENCOUNTER — Other Ambulatory Visit (HOSPITAL_COMMUNITY): Payer: Self-pay

## 2018-10-26 ENCOUNTER — Other Ambulatory Visit (HOSPITAL_COMMUNITY): Payer: Self-pay | Admitting: Cardiology

## 2018-10-26 MED ORDER — POTASSIUM CHLORIDE CRYS ER 20 MEQ PO TBCR: 20.0000 meq | EXTENDED_RELEASE_TABLET | Freq: Two times a day (BID) | ORAL | 3 refills | Status: DC

## 2018-11-02 ENCOUNTER — Other Ambulatory Visit (HOSPITAL_COMMUNITY): Payer: Medicare Other

## 2018-11-03 ENCOUNTER — Other Ambulatory Visit: Payer: Self-pay | Admitting: Cardiovascular Disease

## 2018-11-08 ENCOUNTER — Ambulatory Visit (HOSPITAL_COMMUNITY)
Admission: RE | Admit: 2018-11-08 | Discharge: 2018-11-08 | Disposition: A | Discharge status: Home / Self Care | Payer: Medicare Other | Source: Ambulatory Visit | Attending: Internal Medicine | Admitting: Internal Medicine

## 2018-11-08 ENCOUNTER — Ambulatory Visit (INDEPENDENT_AMBULATORY_CARE_PROVIDER_SITE_OTHER): Payer: Medicare Other

## 2018-11-08 DIAGNOSIS — I255 Ischemic cardiomyopathy: Secondary | ICD-10-CM | POA: Diagnosis not present

## 2018-11-08 DIAGNOSIS — I5042 Chronic combined systolic (congestive) and diastolic (congestive) heart failure: Secondary | ICD-10-CM | POA: Diagnosis not present

## 2018-11-08 LAB — BASIC METABOLIC PANEL
ANION GAP: 9 (ref 5–15)
BUN: 16 mg/dL (ref 8–23)
CO2: 29 mmol/L (ref 22–32)
Calcium: 8.9 mg/dL (ref 8.9–10.3)
Chloride: 100 mmol/L (ref 98–111)
Creatinine, Ser: 1.57 mg/dL — ABNORMAL HIGH (ref 0.61–1.24)
GFR calc Af Amer: 50 mL/min — ABNORMAL LOW (ref >60)
GFR, EST NON AFRICAN AMERICAN: 43 mL/min — AB (ref >60)
Glucose, Bld: 167 mg/dL — ABNORMAL HIGH (ref 70–99)
POTASSIUM: 3.8 mmol/L (ref 3.5–5.1)
SODIUM: 138 mmol/L (ref 135–145)

## 2018-11-09 ENCOUNTER — Ambulatory Visit (INDEPENDENT_AMBULATORY_CARE_PROVIDER_SITE_OTHER): Payer: Medicare Other

## 2018-11-09 ENCOUNTER — Telehealth: Payer: Self-pay

## 2018-11-09 DIAGNOSIS — I5042 Chronic combined systolic (congestive) and diastolic (congestive) heart failure: Secondary | ICD-10-CM

## 2018-11-09 DIAGNOSIS — Z95810 Presence of automatic (implantable) cardiac defibrillator: Secondary | ICD-10-CM

## 2018-11-09 NOTE — Progress Notes (Signed)
EPIC Encounter for ICM Monitoring  Patient Name: Matthew GanserGeorge V Kovar is a 72 y.o. male Date: 11/09/2018 Primary Care Physican: Devra DoppHowell, Tamieka, MD Primary Cardiologist: Croitoru/Bensimhon Electrophysiologist: Graciela HusbandsKlein Last Weight: 433 lbs at 10/25/18 office visit Today's Weight: unknown  Since 23-Oct-2018 AT/AF 5 episodes Time in AT/AF <0.1 hr/day (<0.1%) Longest AT/AF 8 minutes       Attempted call to patient and unable to reach.    Transmission reviewed.  10/25/2018 was 1st visit with HF clinic.    Thoracic impedance returned to normal since changing from Furosemide to Torsemide at 10/25/2018 HF clinic office visit.   Prescribed: Torsemide 20 mg Take 3 tablets (60 mg total) by mouth 2 (two) times daily.  Recommendations: Unable to reach.  Follow-up plan: ICM clinic phone appointment on 12/25/2018.   Office appointment scheduled 11/14/2018 with Dr. Gala RomneyBensimhon and 11/24/2018 with Dr Royann Shiversroitoru.    Copy of ICM check sent to Dr. Graciela HusbandsKlein and Dr Royann Shiversroitoru.   3 month ICM trend: 11/09/2018    AT/AF   1 Year ICM trend:       Karie SodaLaurie S Short, RN 11/09/2018 3:43 PM

## 2018-11-09 NOTE — Progress Notes (Signed)
Remote ICD transmission.   

## 2018-11-09 NOTE — Telephone Encounter (Signed)
Remote ICM transmission received.  Attempted call to patient regarding ICM remote transmission and phone was answered but no one would speak.

## 2018-11-09 NOTE — Progress Notes (Signed)
[facility]

## 2018-11-10 ENCOUNTER — Other Ambulatory Visit: Payer: Self-pay | Admitting: Cardiovascular Disease

## 2018-11-13 ENCOUNTER — Ambulatory Visit (HOSPITAL_COMMUNITY)
Admission: RE | Admit: 2018-11-13 | Discharge: 2018-11-13 | Disposition: A | Discharge status: Home / Self Care | Payer: Medicare Other | Source: Ambulatory Visit | Attending: Family Medicine | Admitting: Family Medicine

## 2018-11-13 ENCOUNTER — Other Ambulatory Visit: Payer: Self-pay | Admitting: Internal Medicine

## 2018-11-13 ENCOUNTER — Other Ambulatory Visit (HOSPITAL_COMMUNITY): Payer: Self-pay | Admitting: Internal Medicine

## 2018-11-13 DIAGNOSIS — I5042 Chronic combined systolic (congestive) and diastolic (congestive) heart failure: Secondary | ICD-10-CM | POA: Insufficient documentation

## 2018-11-13 DIAGNOSIS — I110 Hypertensive heart disease with heart failure: Secondary | ICD-10-CM | POA: Diagnosis not present

## 2018-11-13 DIAGNOSIS — I480 Paroxysmal atrial fibrillation: Secondary | ICD-10-CM | POA: Diagnosis not present

## 2018-11-13 DIAGNOSIS — I214 Non-ST elevation (NSTEMI) myocardial infarction: Secondary | ICD-10-CM | POA: Insufficient documentation

## 2018-11-13 MED ORDER — PERFLUTREN LIPID MICROSPHERE
1.0000 mL | INTRAVENOUS | Status: AC | PRN
  Administered 2018-11-13: 3 mL via INTRAVENOUS
  Filled 2018-11-13: qty 10

## 2018-11-13 NOTE — Progress Notes (Signed)
  Echocardiogram 2D Echocardiogram has been performed.  Matthew SkeenVijay  [name] 11/13/2018, 11:51 AM

## 2018-11-14 ENCOUNTER — Ambulatory Visit (HOSPITAL_COMMUNITY)
Admission: RE | Admit: 2018-11-14 | Discharge: 2018-11-14 | Disposition: A | Discharge status: Home / Self Care | Payer: Medicare Other | Source: Ambulatory Visit | Attending: Internal Medicine | Admitting: Internal Medicine

## 2018-11-14 ENCOUNTER — Other Ambulatory Visit: Payer: Self-pay

## 2018-11-14 VITALS — BP 92/75 | HR 62 | Wt >= 6400 oz

## 2018-11-14 DIAGNOSIS — I5042 Chronic combined systolic (congestive) and diastolic (congestive) heart failure: Secondary | ICD-10-CM | POA: Diagnosis not present

## 2018-11-14 DIAGNOSIS — E785 Hyperlipidemia, unspecified: Secondary | ICD-10-CM | POA: Diagnosis not present

## 2018-11-14 DIAGNOSIS — R0902 Hypoxemia: Secondary | ICD-10-CM | POA: Diagnosis not present

## 2018-11-14 DIAGNOSIS — I480 Paroxysmal atrial fibrillation: Secondary | ICD-10-CM | POA: Diagnosis not present

## 2018-11-14 DIAGNOSIS — G4733 Obstructive sleep apnea (adult) (pediatric): Secondary | ICD-10-CM | POA: Insufficient documentation

## 2018-11-14 DIAGNOSIS — I472 Ventricular tachycardia: Secondary | ICD-10-CM | POA: Insufficient documentation

## 2018-11-14 DIAGNOSIS — Z9981 Dependence on supplemental oxygen: Secondary | ICD-10-CM | POA: Diagnosis not present

## 2018-11-14 DIAGNOSIS — I25110 Atherosclerotic heart disease of native coronary artery with unstable angina pectoris: Secondary | ICD-10-CM | POA: Insufficient documentation

## 2018-11-14 DIAGNOSIS — I493 Ventricular premature depolarization: Secondary | ICD-10-CM | POA: Diagnosis not present

## 2018-11-14 DIAGNOSIS — Z6841 Body Mass Index (BMI) 40.0 and over, adult: Secondary | ICD-10-CM | POA: Insufficient documentation

## 2018-11-14 DIAGNOSIS — Z8249 Family history of ischemic heart disease and other diseases of the circulatory system: Secondary | ICD-10-CM | POA: Diagnosis not present

## 2018-11-14 DIAGNOSIS — Z7902 Long term (current) use of antithrombotics/antiplatelets: Secondary | ICD-10-CM | POA: Insufficient documentation

## 2018-11-14 DIAGNOSIS — I447 Left bundle-branch block, unspecified: Secondary | ICD-10-CM | POA: Insufficient documentation

## 2018-11-14 DIAGNOSIS — Z794 Long term (current) use of insulin: Secondary | ICD-10-CM | POA: Diagnosis not present

## 2018-11-14 DIAGNOSIS — I252 Old myocardial infarction: Secondary | ICD-10-CM | POA: Insufficient documentation

## 2018-11-14 DIAGNOSIS — I5022 Chronic systolic (congestive) heart failure: Secondary | ICD-10-CM | POA: Insufficient documentation

## 2018-11-14 DIAGNOSIS — M109 Gout, unspecified: Secondary | ICD-10-CM | POA: Diagnosis not present

## 2018-11-14 DIAGNOSIS — I255 Ischemic cardiomyopathy: Secondary | ICD-10-CM | POA: Insufficient documentation

## 2018-11-14 DIAGNOSIS — Z95810 Presence of automatic (implantable) cardiac defibrillator: Secondary | ICD-10-CM | POA: Insufficient documentation

## 2018-11-14 DIAGNOSIS — Z955 Presence of coronary angioplasty implant and graft: Secondary | ICD-10-CM | POA: Diagnosis not present

## 2018-11-14 DIAGNOSIS — I130 Hypertensive heart and chronic kidney disease with heart failure and stage 1 through stage 4 chronic kidney disease, or unspecified chronic kidney disease: Secondary | ICD-10-CM | POA: Diagnosis not present

## 2018-11-14 DIAGNOSIS — N183 Chronic kidney disease, stage 3 unspecified: Secondary | ICD-10-CM

## 2018-11-14 DIAGNOSIS — Z79899 Other long term (current) drug therapy: Secondary | ICD-10-CM | POA: Insufficient documentation

## 2018-11-14 DIAGNOSIS — E1122 Type 2 diabetes mellitus with diabetic chronic kidney disease: Secondary | ICD-10-CM | POA: Diagnosis not present

## 2018-11-14 DIAGNOSIS — I2510 Atherosclerotic heart disease of native coronary artery without angina pectoris: Secondary | ICD-10-CM | POA: Diagnosis not present

## 2018-11-14 DIAGNOSIS — Z87891 Personal history of nicotine dependence: Secondary | ICD-10-CM | POA: Insufficient documentation

## 2018-11-14 DIAGNOSIS — Z951 Presence of aortocoronary bypass graft: Secondary | ICD-10-CM | POA: Diagnosis not present

## 2018-11-14 DIAGNOSIS — Z7901 Long term (current) use of anticoagulants: Secondary | ICD-10-CM | POA: Insufficient documentation

## 2018-11-14 NOTE — Progress Notes (Signed)
Advanced Heart Failure Clinic Note   Referring Physician: Luke Kilroy, PA PCP: Devra DoppHowell, Tamieka, MD PCP-Cardiologist: Thurmon FairMihai Croitoru, MD   HPI: Ezequiel GanserGeorge V Stipp is a 72 y.o. 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 He has been referred to the HF clinic by Corine ShelterLuke Kilroy PA-C for further evaluation of his HF.   Admitted 8/2-07/26/18 with unstable angina. He had LHC and received DES.   He [facility] 09/25/18 and was feeling badly. He was feeling fatigued with SOB on exertion. He was offered admission for RHC, but declined. He was referred to AHF clinic.  We saw him for the first time in HF Clinic earlier this month. Mr. Jethro Polingorwood is a 72 y/o morbidly obese male with CKD 3, DM2, CAD s/p CABG and multiple stents and systolic HF due to iCM with EF 15-20%. Was NYHA IIIb symptoms with marked volume overload. He was hypoxic with minimal exertion. We started home O2 and changed lasix to torsemide 60 bid. Weight down about 20 pounds at home 433-> 412. No dizzy. Breathing better with diuresis and oxygen. Has not seen Pulmonary yet for sleep study. BP low but not dizzy. Wife has cut torsemide back to 40 bid due to dark urine and now urine light yellow again.   Echo done 11/25  Very poor images even with Definity. EF read 55-60%. I think more in the 35% range  Had BMET 5 days ago creatinine 1.54 -> 1.57. Given iron for IDA  LHC 07/24/18:  Prox RCA lesion is 100% stenosed.  Ost LAD to Prox LAD lesion is 100% stenosed.  Ost Cx to Prox Cx lesion is 100% stenosed.  Origin lesion is 100% stenosed.  Origin to Prox Graft lesion is 100% stenosed.  Mid Graft to Insertion lesion is 95% stenosed.  Mid LAD to Dist LAD lesion is 100% stenosed. LHC 07/25/18: DES dist graft to insertion lesion  No RHC on file.   Echo 03/14/17: EF 15-20%, grade 3 DD, RV not  visualized  SH: Former smoker. Quit 1.5 years ago. Used to smoke 2 cigarettes/day. No ETOH or drug use. [place] in [place] his [place]. He is a former Secondary school teacherhighschool principal.  FH: Mother with CHF and CAD. Unknown family history on dad's side. No siblings   Past Medical History:  Diagnosis Date  . CAD (coronary artery disease)    a. CABG 1995 and multiple stents. b. Known graft occlusions - NSTEMI 06/2016 s/p DES to distal SVG-diag.  . Chronic systolic CHF (congestive heart failure) (HCC)   . CKD (chronic kidney disease), stage III (HCC)   . Dietary noncompliance   . DM (diabetes mellitus) (HCC)   . DM type 2 causing CKD stage 3 (HCC)   . Dyslipidemia - low HDL and high triglycerides   . Gout   . Ischemic cardiomyopathy    a. LVEF 10-15% by cath 06/2016.  Marland Kitchen. Leukocytosis   . Morbid obesity (HCC)   . Myocardial infarction (HCC)   . NSTEMI (non-ST elevated myocardial infarction) (HCC) 06/26/2016  . PAF (paroxysmal atrial fibrillation) (HCC)   . PONV (postoperative nausea and vomiting)   . Systemic hypertension   . Unstable angina (HCC) 07/21/2018    Current Outpatient Medications  Medication Sig Dispense Refill  . acetaminophen (TYLENOL) 325 MG tablet Take 1-2 tablets (325-650 mg total) by  mouth every 4 (four) hours as needed for mild pain.    Marland Kitchen allopurinol (ZYLOPRIM) 100 MG tablet Take 100 mg by mouth daily.    Marland Kitchen amiodarone (PACERONE) 200 MG tablet Take 1 tablet (200 mg total) by mouth daily. 90 tablet 3  . atorvastatin (LIPITOR) 80 MG tablet TAKE 1 TABLET BY MOUTH ONCE DAILY 90 tablet 1  . cholecalciferol (VITAMIN D) 1000 UNITS tablet Take 1,000 Units by mouth every other day.     . clopidogrel (PLAVIX) 75 MG tablet TAKE 1 TABLET BY MOUTH DAILY 90 tablet 3  . ELIQUIS 5 MG TABS tablet TAKE 1 TABLET BY MOUTH TWICE A DAY 60 tablet 0  . ENTRESTO 24-26 MG TAKE 1 TABLET BY MOUTH TWICE DAILY 180 tablet 1  . Hyaluronan (ORTHOVISC) 30 MG/2ML SOSY Inject 30 mg into the articular space daily.  Every six months. Last dose was in August 2019    . insulin glargine (LANTUS) 100 UNIT/ML injection Inject 0.5 mLs (50 Units total) into the skin every morning. 10 mL 11  . insulin lispro (HUMALOG KWIKPEN) 100 UNIT/ML KiwkPen Inject 20 Units into the skin every morning.     . INVOKANA 100 MG TABS Take 100 mg by mouth daily.    . metoprolol succinate (TOPROL-XL) 50 MG 24 hr tablet Take 1 tablet (50 mg total) by mouth daily. NEED OV. 90 tablet 2  . nitroGLYCERIN (NITROSTAT) 0.4 MG SL tablet PLACE 1 TABLET UNDER THE TONGUE AS NEEDED EVERY 5 MINUTES FOR CHEST PAIN 25 tablet 6  . Omega-3 Fatty Acids (FISH OIL) 1200 MG CAPS Take 1,200 mg by mouth daily.     . potassium chloride SA (K-DUR,KLOR-CON) 20 MEQ tablet Take 1 tablet (20 mEq total) by mouth 2 (two) times daily. 30 tablet 3  . sertraline (ZOLOFT) 50 MG tablet Take 1 tablet (50 mg total) by mouth daily. 90 tablet 0  . torsemide (DEMADEX) 20 MG tablet Take 3 tablets (60 mg total) by mouth 2 (two) times daily. 180 tablet 3  . VICTOZA 18 MG/3ML SOPN Inject 1.2 mg into the skin daily.  0  . isosorbide mononitrate (IMDUR) 30 MG 24 hr tablet Take 1 tablet (30 mg total) by mouth daily. 90 tablet 3   No current facility-administered medications for this encounter.     Allergies  Allergen Reactions  . Coreg [Carvedilol] Nausea And Vomiting      Social History   Socioeconomic History  . Marital status: Married    Spouse name: Not on file  . Number of children: Not on file  . Years of education: Not on file  . Highest education level: Not on file  Occupational History  . Not on file  Social Needs  . Financial resource strain: Not on file  . Food insecurity:    Worry: Not on file    Inability: Not on file  . Transportation needs:    Medical: Not on file    Non-medical: Not on file  Tobacco Use  . Smoking status: Former Smoker    Types: Cigarettes    Last attempt to quit: 03/10/2017    Years since quitting: 1.6  . Smokeless tobacco:  Never Used  . Tobacco comment: "1-2 cigarettes per day"  Substance and Sexual Activity  . Alcohol use: Not Currently    Alcohol/week: 0.0 standard drinks    Comment: rare  . Drug use: No  . Sexual activity: Not on file  Lifestyle  . Physical activity:    Days  per week: Not on file    Minutes per session: Not on file  . Stress: Not on file  Relationships  . Social connections:    Talks on phone: Not on file    Gets together: Not on file    Attends religious service: Not on file    Active member of club or organization: Not on file    Attends meetings of clubs or organizations: Not on file    Relationship status: Not on file  . Intimate partner violence:    Fear of current or ex partner: Not on file    Emotionally abused: Not on file    Physically abused: Not on file    Forced sexual activity: Not on file  Other Topics Concern  . Not on file  Social History Narrative  . Not on file      Family History  Problem Relation Age of Onset  . Heart failure Mother   . Dementia Mother   . Hearing loss Other   . Hypertension Other     Vitals:   11/14/18 1017  BP: 92/75  Pulse: 62  SpO2: 94%  Weight: (!) 191.5 kg (422 lb 3.2 oz)    Wt Readings from Last 3 Encounters:  11/14/18 (!) 191.5 kg (422 lb 3.2 oz)  10/25/18 (!) 196.4 kg (433 lb)  10/18/18 (!) 175.5 kg (387 lb)    PHYSICAL EXAM: General:  Obese male in [facility] wearing O2 No resp difficulty HEENT: normal Neck: supple. no obvious JVD. Carotids 2+ bilat; no bruits. No lymphadenopathy or thryomegaly appreciated. Cor: PMI non palpable  Distant . Regular rate & rhythm. No rubs, gallops or murmurs. Lungs: clear Abdomen: obese soft, nontender, nondistended. No hepatosplenomegaly. No bruits or masses. Good bowel sounds. Extremities: no cyanosis, clubbing, rash, edema Neuro: alert & orientedx3, cranial nerves grossly intact. moves all 4 extremities w/o difficulty. Affect pleasant   ECG: A paced 61 bpm with occasional PVCs.  Incomplete LBBB (QRS 116 ms) Looks similar to previous. Personally reviewed.    ASSESSMENT & PLAN:  1. Chronic systolic HF due to ICM. Has MDT ICD - Echo 03/14/17: EF 15-20%, grade 3 DD, RV not visualized - Echo 11/13/18 very poor images even with Definity, EF read as 55% I think closer to 35% - NYHA III - Volume status much improved - Continue torsemide 40 mg BID. Can increase to 60 bid as needed - Continue Entresto 24/26 mg BID. BP too low to increase - Continue Toprol XL 50 mg daily - Consider spiro next visit if BP tolerates - Stressed need for weight loss with goal weight 370-380 for now - May need RHC - labs 2 weeks (refuses sooner)  2. CAD s/p CABG. Most recent PCI 07/2018 - No s/s ischemia - Continue plavix and eliquis. Will need to continue plavix for 12 months per Dr Eldridge Dace - Continue statin  3. Hx of VT - Has MDT ICD  4. PAF - remains in NSR - Continue amio 200 mg daily - Continue Eliquis 5 mg BID  5. Snoring and apnea at night - Has referral to pulmonary for BiPAP  6. DM - Per PCP. On invokana, victoza, and insulin  7. CKD 3 - Creatinine stable 1.5 - BMET 2 weeks  8. Obesity Body mass index is 46.97 kg/m.  9. Hypoxia - Continue home O2. Needs weight loss   Arvilla Meres, MD 11/14/18

## 2018-11-14 NOTE — Progress Notes (Signed)
Pt refused sleep study. States "they've been trying to get me to do that for 10 years. I don't want it."

## 2018-11-14 NOTE — Patient Instructions (Signed)
Labs need to be done in 2 weeks  Follow up with Dr. Gala RomneyBensimhon in 2 months

## 2018-11-20 ENCOUNTER — Other Ambulatory Visit: Payer: Self-pay | Admitting: Cardiovascular Disease

## 2018-11-24 ENCOUNTER — Encounter: Payer: Self-pay | Admitting: Cardiovascular Disease

## 2018-11-24 ENCOUNTER — Ambulatory Visit: Payer: Medicare Other | Admitting: Cardiovascular Disease

## 2018-11-24 VITALS — BP 119/70 | HR 64 | Ht 79.0 in | Wt >= 6400 oz

## 2018-11-24 DIAGNOSIS — Z79899 Other long term (current) drug therapy: Secondary | ICD-10-CM

## 2018-11-24 DIAGNOSIS — Z95810 Presence of automatic (implantable) cardiac defibrillator: Secondary | ICD-10-CM

## 2018-11-24 DIAGNOSIS — I472 Ventricular tachycardia, unspecified: Secondary | ICD-10-CM

## 2018-11-24 DIAGNOSIS — E669 Obesity, unspecified: Secondary | ICD-10-CM

## 2018-11-24 DIAGNOSIS — I5042 Chronic combined systolic (congestive) and diastolic (congestive) heart failure: Secondary | ICD-10-CM | POA: Diagnosis not present

## 2018-11-24 DIAGNOSIS — Z5181 Encounter for therapeutic drug level monitoring: Secondary | ICD-10-CM | POA: Diagnosis not present

## 2018-11-24 DIAGNOSIS — I480 Paroxysmal atrial fibrillation: Secondary | ICD-10-CM

## 2018-11-24 DIAGNOSIS — E785 Hyperlipidemia, unspecified: Secondary | ICD-10-CM

## 2018-11-24 DIAGNOSIS — F4310 Post-traumatic stress disorder, unspecified: Secondary | ICD-10-CM

## 2018-11-24 DIAGNOSIS — I10 Essential (primary) hypertension: Secondary | ICD-10-CM

## 2018-11-24 DIAGNOSIS — I25810 Atherosclerosis of coronary artery bypass graft(s) without angina pectoris: Secondary | ICD-10-CM | POA: Insufficient documentation

## 2018-11-24 DIAGNOSIS — N183 Chronic kidney disease, stage 3 unspecified: Secondary | ICD-10-CM

## 2018-11-24 DIAGNOSIS — E1169 Type 2 diabetes mellitus with other specified complication: Secondary | ICD-10-CM

## 2018-11-24 LAB — COMPREHENSIVE METABOLIC PANEL
A/G RATIO: 1.5 (ref 1.2–2.2)
ALBUMIN: 3.8 g/dL (ref 3.5–4.8)
ALK PHOS: 52 IU/L (ref 39–117)
ALT: 15 IU/L (ref 0–44)
AST: 18 IU/L (ref 0–40)
BUN / CREAT RATIO: 13 (ref 10–24)
BUN: 19 mg/dL (ref 8–27)
Bilirubin Total: 0.3 mg/dL (ref 0.0–1.2)
CO2: 28 mmol/L (ref 20–29)
Calcium: 9 mg/dL (ref 8.6–10.2)
Chloride: 95 mmol/L — ABNORMAL LOW (ref 96–106)
Creatinine, Ser: 1.48 mg/dL — ABNORMAL HIGH (ref 0.76–1.27)
GFR calc Af Amer: 55 mL/min/{1.73_m2} — ABNORMAL LOW (ref >59)
GFR calc non Af Amer: 47 mL/min/{1.73_m2} — ABNORMAL LOW (ref >59)
GLOBULIN, TOTAL: 2.5 g/dL (ref 1.5–4.5)
Glucose: 152 mg/dL — ABNORMAL HIGH (ref 65–99)
POTASSIUM: 4.4 mmol/L (ref 3.5–5.2)
SODIUM: 139 mmol/L (ref 134–144)
Total Protein: 6.3 g/dL (ref 6.0–8.5)

## 2018-11-24 MED ORDER — SPIRONOLACTONE 25 MG PO TABS: 25.0000 mg | ORAL_TABLET | Freq: Every day | ORAL | 3 refills | Status: DC

## 2018-11-24 MED ORDER — TORSEMIDE 20 MG PO TABS: 40.0000 mg | ORAL_TABLET | Freq: Two times a day (BID) | ORAL | 3 refills | Status: DC

## 2018-11-24 NOTE — Patient Instructions (Addendum)
Medication Instructions:  Dr Royann Shiversroitoru has recommended making the following medication changes: 1. START Spironolactone 25 mg - take 1 tablet daily  If you need a refill on your cardiac medications before your next appointment, please call your pharmacy.   Lab work: Your physician recommends that you return for lab work TODAY and again in 1 month.  If you have labs (blood work) drawn today and your tests are completely normal, you will receive your results only by: Marland Kitchen. MyChart Message (if you have MyChart) OR . A paper copy in the mail If you have any lab test that is abnormal or we need to change your treatment, we will call you to review the results.  Follow-Up: At [facility], you and your health needs are our priority.  As part of our continuing mission to provide you with exceptional heart care, we have created designated Provider Care Teams.  These Care Teams include your primary Cardiologist (physician) and Advanced Practice Providers (APPs -  Physician Assistants and Nurse Practitioners) who all work together to provide you with the care you need, when you need it. You will need a follow up appointment in 6 months.  Please call our office 2 months in advance to schedule this appointment.  You may see Thurmon FairMihai Croitoru, MD or one of the following Advanced Practice Providers on your designated Care Team: JeffersonHao Meng, [place]PA-C . Micah FlesherAngela Duke, PA-C . You will receive a reminder letter in the mail two months in advance. If you don't receive a letter, please call our office to schedule the follow-up appointment.

## 2018-11-24 NOTE — Progress Notes (Signed)
Patient ID: [name], male   DOB: 10/14/1946, 72 y.o.   MRN: [id number]    Cardiology Office Note    Date:  11/24/2018   ID:  LAMARIUS DIRR, DOB [birth year], MRN [id number]  PCP:  Devra Dopp, MD  Cardiologist:   Thurmon Fair, MD   Chief Complaint  Patient presents with  . Follow-up  CHF, CAD, VT  History of Present Illness:  CRISS PALLONE is a 72 y.o. male with severe CAD and cardiomyopathy, combined systolic and diastolic HF, s/p ICD, Sustained ventricular tachycardia, PAFib, morbid obesity.  This is the first time I am seeing [place] since he underwent placement of a drug-eluting stent (Orsiro 2.5 x 26, Varanasi, 07/25/2018) in the distal graft to the first diagonal branch.  He is not doing too well, becomes very easily dyspneic and requires oxygen supplementation.  When leaving the house uses a wheelchair.  His last appointment in the heart failure clinic was on November 26 and Dr. Gala Romney mentioned the possible need for right heart catheterization.  His dose of torsemide was recently decreased to 40 mg twice a day (although the [facility] reports 60 mg twice daily).  His weight today is virtually identical to November 26 when it was 412, now 414 pounds.  He does not have leg edema.  He does appear to describe orthopnea.  His OptiVol shows that throughout the months of July through November he was "wet", just recently returned to baseline over the last 3 weeks or so after increased dose of diuretics.  Throughout October he had a marked increase in the burden of atrial fibrillation, but this has resolved in parallel with the fluid overload.  Just over the last week there is a trend for repeat fluid accumulation.  He is taking amiodarone and last had thyroid function tests about 7 months ago.  He had normal liver tests in October.  He has not had any ventricular tachycardia or defibrillator shocks since he started amiodarone  Other than about 1% burden of atrial fibrillation, his  defibrillator interrogation shows virtually 100% atrial paced, ventricular sensed rhythm.  There is only 0.7% ventricular pacing.  Estimated generator longevity is 5.2 years.  Activity level is less than half an hour a day.  His echo performed just a week ago shows a left ventricular ejection fraction of 55-60% which is unlikely to be accurate.  There is a mention of apical hypokinesis.  I agree with Dr. Prescott Gum assessment that EF is more likely to be around 35%.  His recent creatinine has been around 1.55-1.6 corresponding to a GFR of about 50.  He had problems with anemia and not long ago received intravenous iron for an transferrin saturation of 6%/ferritin 11.  He is currently receiving Eliquis and clopidogrel.   [place] had an anterior STEMI on Feb 01, 2015, due to acute occlusion of the previously stented SVG-diagonal artery and had emergency PCI with a 2.5 x 38 mm Promus DES and a downstream POBA of this vessel. The only other patent conduit is the LIMA to LAD (native vessels, SVG to RCA and SVG to LCX are all chronically occluded).  he developed in-stent restenosis and March 2018 and had recurrent PCI (angioplasty without stent). This was heralded by recurrent VT with 3 consecutive defibrillator shocks and congestive heart failure exacerbation. He has at least moderately depressed LVEF. At one point LVEF was estimated at 15%, most recent echo probably overestimates his real ejection fraction. He is morbidly obese with a BMI  well over 40. He has excellent control of diabetes mellitus with a hemoglobin A1c around 6%. It is very likely that he has obstructive sleep apnea but he declines both diagnostic sleep study and treatment with CPAP. His CHADSVASC score is 665 (age, CAD, CHF, DM, HTN).  He had repeat placement of a drug-eluting stent to the saphenous vein graft to the diagonal artery in August 2019.  Past Medical History:  Diagnosis Date  . CAD (coronary artery disease)    a. CABG 1995 and  multiple stents. b. Known graft occlusions - NSTEMI 06/2016 s/p DES to distal SVG-diag.  . Chronic systolic CHF (congestive heart failure) (HCC)   . CKD (chronic kidney disease), stage III (HCC)   . Dietary noncompliance   . DM (diabetes mellitus) (HCC)   . DM type 2 causing CKD stage 3 (HCC)   . Dyslipidemia - low HDL and high triglycerides   . Gout   . Ischemic cardiomyopathy    a. LVEF 10-15% by cath 06/2016.  Marland Kitchen. Leukocytosis   . Morbid obesity (HCC)   . Myocardial infarction (HCC)   . NSTEMI (non-ST elevated myocardial infarction) (HCC) 06/26/2016  . PAF (paroxysmal atrial fibrillation) (HCC)   . PONV (postoperative nausea and vomiting)   . Systemic hypertension   . Unstable angina (HCC) 07/21/2018    Past Surgical History:  Procedure Laterality Date  . CARDIAC CATHETERIZATION N/A 06/28/2016   Procedure: Left Heart Cath and Cors/Grafts Angiography;  Surgeon: Lennette Biharihomas A Kelly, MD;  [facility];  Service: Cardiovascular;  Laterality: N/A;  . CARDIAC CATHETERIZATION N/A 06/29/2016   Procedure: Coronary Stent Intervention;  Surgeon: Lennette Biharihomas A Kelly, MD;  Location: [facility] CV LAB;  Service: Cardiovascular;  Laterality: N/A;  . CORONARY ANGIOPLASTY WITH STENT PLACEMENT  01/12/2008   multivessel CAD occluded vein grafts to multiple sites including RCA,obtuse marginal branch & CX.  Successful PTCA and stenting distal graft.  . CORONARY ARTERY BYPASS GRAFT  1995  . CORONARY BALLOON ANGIOPLASTY N/A 03/16/2017   Procedure: Coronary Balloon Angioplasty;  Surgeon: Corky CraftsJayadeep S Varanasi, MD;  [facility];  Service: Cardiovascular;  Laterality: N/A;  . CORONARY STENT INTERVENTION N/A 07/25/2018   Procedure: CORONARY STENT INTERVENTION;  Surgeon: Corky CraftsVaranasi, Jayadeep S, MD;  Location: [facility] CV LAB;  Service: Cardiovascular;  Laterality: N/A;  . EP IMPLANTABLE DEVICE N/A 05/09/2015   Procedure: Icd Implant;  Surgeon: Thurmon FairMihai Criston Chancellor, MD;  Location: [facility] INVAS[facility] CV LAB;   Service: Cardiovascular;  Laterality: N/A;  . LEFT HEART CATH AND CORS/GRAFTS ANGIOGRAPHY N/A 03/14/2017   Procedure: Left Heart Cath and Cors/Grafts Angiography;  Surgeon: Corky CraftsJayadeep S Varanasi, MD;  [facility];  Service: Cardiovascular;  Laterality: N/A;  . LEFT HEART CATH AND CORS/GRAFTS ANGIOGRAPHY N/A 07/24/2018   Procedure: LEFT HEART CATH AND CORS/GRAFTS ANGIOGRAPHY;  Surgeon: Runell GessBerry, Jonathan J, MD;  Location: [facility] CV LAB;  Service: Cardiovascular;  Laterality: N/A;  . LEFT HEART CATHETERIZATION WITH CORONARY ANGIOGRAM N/A 01/29/2015   Procedure: LEFT HEART CATHETERIZATION WITH CORONARY ANGIOGRAM;  Surgeon: Lennette Biharihomas A Kelly, MD;  [facility];  Service: Cardiovascular;  Laterality: N/A;  . THORACIC AORTOGRAM N/A 03/16/2017   Procedure: Thoracic Aortogram;  Surgeon: Corky CraftsJayadeep S Varanasi, MD;  [facility];  Service: Cardiovascular;  Laterality: N/A;  . TONSILLECTOMY      Outpatient Medications Prior to Visit  Medication Sig Dispense Refill  . acetaminophen (TYLENOL) 325 MG tablet Take 1-2 tablets (325-650 mg total)  by mouth every 4 (four) hours as needed for mild pain.    Marland Kitchen allopurinol (ZYLOPRIM) 100 MG tablet Take 100 mg by mouth daily.    Marland Kitchen amiodarone (PACERONE) 200 MG tablet Take 1 tablet (200 mg total) by mouth daily. 90 tablet 3  . atorvastatin (LIPITOR) 80 MG tablet TAKE 1 TABLET BY MOUTH ONCE DAILY 90 tablet 1  . cholecalciferol (VITAMIN D) 1000 UNITS tablet Take 1,000 Units by mouth every other day.     . clopidogrel (PLAVIX) 75 MG tablet TAKE 1 TABLET BY MOUTH DAILY 90 tablet 3  . ELIQUIS 5 MG TABS tablet TAKE 1 TABLET BY MOUTH TWICE A DAY 60 tablet 0  . ENTRESTO 24-26 MG TAKE 1 TABLET BY MOUTH TWICE DAILY 180 tablet 1  . ENTRESTO 24-26 MG TAKE 1 TABLET BY MOUTH TWICE DAILY 60 tablet 6  . Hyaluronan (ORTHOVISC) 30 MG/2ML SOSY Inject 30 mg into the articular space daily. Every six months. Last dose was in August 2019    . insulin glargine  (LANTUS) 100 UNIT/ML injection Inject 0.5 mLs (50 Units total) into the skin every morning. 10 mL 11  . insulin lispro (HUMALOG KWIKPEN) 100 UNIT/ML KiwkPen Inject 20 Units into the skin every morning.     . INVOKANA 100 MG TABS Take 100 mg by mouth daily.    . metoprolol succinate (TOPROL-XL) 50 MG 24 hr tablet Take 1 tablet (50 mg total) by mouth daily. NEED OV. 90 tablet 2  . nitroGLYCERIN (NITROSTAT) 0.4 MG SL tablet PLACE 1 TABLET UNDER THE TONGUE AS NEEDED EVERY 5 MINUTES FOR CHEST PAIN 25 tablet 6  . Omega-3 Fatty Acids (FISH OIL) 1200 MG CAPS Take 1,200 mg by mouth daily.     . potassium chloride SA (K-DUR,KLOR-CON) 20 MEQ tablet Take 1 tablet (20 mEq total) by mouth 2 (two) times daily. 30 tablet 3  . sertraline (ZOLOFT) 50 MG tablet Take 1 tablet (50 mg total) by mouth daily. 90 tablet 0  . torsemide (DEMADEX) 20 MG tablet Take 3 tablets (60 mg total) by mouth 2 (two) times daily. 180 tablet 3  . VICTOZA 18 MG/3ML SOPN Inject 1.2 mg into the skin daily.  0  . isosorbide mononitrate (IMDUR) 30 MG 24 hr tablet Take 1 tablet (30 mg total) by mouth daily. 90 tablet 3   No facility-administered medications prior to visit.      Allergies:   Coreg [carvedilol]   Social History   Socioeconomic History  . Marital status: Married    Spouse name: Not on file  . Number of children: Not on file  . Years of education: Not on file  . Highest education level: Not on file  Occupational History  . Not on file  Social Needs  . Financial resource strain: Not on file  . Food insecurity:    Worry: Not on file    Inability: Not on file  . Transportation needs:    Medical: Not on file    Non-medical: Not on file  Tobacco Use  . Smoking status: Former Smoker    Types: Cigarettes    Last attempt to quit: 03/10/2017    Years since quitting: 1.7  . Smokeless tobacco: Never Used  . Tobacco comment: "1-2 cigarettes per day"  Substance and Sexual Activity  . Alcohol use: Not Currently     Alcohol/week: 0.0 standard drinks    Comment: rare  . Drug use: No  . Sexual activity: Not on file  Lifestyle  .  Physical activity:    Days per week: Not on file    Minutes per session: Not on file  . Stress: Not on file  Relationships  . Social connections:    Talks on phone: Not on file    Gets together: Not on file    Attends religious service: Not on file    Active member of club or organization: Not on file    Attends meetings of clubs or organizations: Not on file    Relationship status: Not on file  Other Topics Concern  . Not on file  Social History Narrative  . Not on file     Family History:  The patient's family history includes Dementia in his mother; Hearing loss in his other; Heart failure in his mother; Hypertension in his other.   ROS:   Please see the history of present illness.    ROS All other systems reviewed and are negative.   PHYSICAL EXAM:   VS:  BP 119/70   Pulse 64   Ht 6\' 7"  (2.007 m)   Wt (!) 414 lb (187.8 kg)   BMI 46.64 kg/m     General: Alert, oriented x3, no distress, he looks substantially older than I remember him and is a little less jovial than I am used to seeing him.  He is morbidly obese and this makes his physical exam very challenging Head: no evidence of trauma, PERRL, EOMI, no exophtalmos or lid lag, no myxedema, no xanthelasma; normal ears, nose and oropharynx Neck: Impossible to see jugular venous pulsations or hepatojugular reflux; brisk carotid pulses without delay and no carotid bruits Chest: clear to auscultation, no signs of consolidation by percussion or palpation, normal fremitus, symmetrical and full respiratory excursions.  Healthy left subclavian defibrillator site. Cardiovascular: normal position and quality of the apical impulse, regular rhythm, normal first and second heart sounds, no murmurs, rubs or gallops Abdomen: no tenderness or distention, no masses by palpation, no abnormal pulsatility or arterial bruits,  normal bowel sounds, no hepatosplenomegaly Extremities: no clubbing, cyanosis or edema; 2+ radial, ulnar and brachial pulses bilaterally; 2+ right femoral, posterior tibial and dorsalis pedis pulses; 2+ left femoral, posterior tibial and dorsalis pedis pulses; no subclavian or femoral bruits Neurological: grossly nonfocal Psych: Normal mood and affect    Wt Readings from Last 3 Encounters:  11/24/18 (!) 414 lb (187.8 kg)  11/14/18 (!) 422 lb 3.2 oz (191.5 kg)  10/25/18 (!) 433 lb (196.4 kg)      Studies/Labs Reviewed:   EKG:  EKG is not ordered today.    Recent Labs: 04/20/2018: ALT 24 06/08/2018: Magnesium 2.3 09/21/2018: Hemoglobin 8.6; Platelets 201 10/25/2018: B Natriuretic Peptide 1,051.3; TSH 4.199 11/08/2018: BUN 16; Creatinine, Ser 1.57; Potassium 3.8; Sodium 138   Lipid Panel    Component Value Date/Time   CHOL 111 06/25/2016 0333   TRIG 108 06/25/2016 0333   HDL 26 (L) 06/25/2016 0333   CHOLHDL 4.3 06/25/2016 0333   VLDL 22 06/25/2016 0333   LDLCALC 63 06/25/2016 0333    ASSESSMENT:    1. Chronic combined systolic and diastolic CHF (congestive heart failure) (HCC)   2. Paroxysmal atrial fibrillation (HCC)   3. Ventricular tachycardia (HCC)   4. Encounter for monitoring amiodarone therapy   5. Coronary artery disease involving coronary bypass graft of native heart without angina pectoris   6. Essential hypertension   7. Diabetes mellitus type 2 in obese (HCC)   8. CKD (chronic kidney disease) stage 3, GFR 30-59  ml/min (HCC)   9. ICD (implantable cardioverter-defibrillator) in place   10. Morbid obesity (HCC)   11. Dyslipidemia (high LDL; low HDL)   12. PTSD (post-traumatic stress disorder)   13. Medication management      PLAN:  In order of problems listed above:  1. CHF: As always, his physical exam is a huge challenge.  Not hypervolemic as far as I can tell, although he still requires oxygen.  After showing evidence of fluid overload from months, his  thoracic impedance returned to baseline about 3 or 4 weeks ago.  It is showing an upward trend now but he is only gained 2 weeks since his appointment in the heart failure clinic with Dr. Gala Romney.  His blood pressure is a little higher.  I am not sure he will tolerate a higher dose of Entresto, but we will try to add spironolactone.  This might help reduce the need for potassium supplements (he hates swallowing the big pills).  We will recheck his labs today.  Enrolled in the heart failure device remote monitoring clinic with Randon Goldsmith.  Poorly tolerant to spironolactone.  Reviewed the importance of sodium restriction and overall water restriction, did not make changes to his loop diuretics.  He is taking torsemide 40 mg twice a day.  He is still quite short of breath and he might need a right heart catheterization.  He has a follow-up visit in the heart failure clinic next month. 2. PAF: There was an increase in paroxysmal atrial fibrillation detected by his pacemaker, which appeared to correlate with the peak of his fluid overload in September-October. Continue Eliquis. CHADSVasc 5 (age, CHF, CAD, HTN, DM). 3. VT:  No recurrent VT on amiodarone 200 mg daily maintenance dose.  Previous VT was probably a signal of ischemia.   4. Amiodarone: Recent liver tests were normal.  Recheck TSH.  I do not think this is the case, but need to keep in mind the possibility of amiodarone toxicity as an explanation of his persistent dyspnea.  I really do not think we have have any good alternatives to treat his atrial and ventricular arrhythmias. 5. CAD: no angina, had angioplasty without stent in March 2018 and another stent in August 2019.  Continue clopidogrel therapy indefinitely. Not receiving aspirin since he is also taking full anticoagulation  6. HTN:  BP is well controlled, his blood pressure offered the opportunity to add spironolactone today. 7. DM on insulin: He reports good control.  I do not have a recent  hemoglobin A1c 8. CKD: stable creatinine, baseline probably around 1.6, GFR around 40-45.  He sees Dr. Allena Katz at [facility] 9. ICD: Normal device function, monitored by Dr. Graciela Husbands.  10. Morbid obesity: Still trying to get him to have a sleep study, we are all pretty certain that he has OSA. He remains very sedentary.  11. HLP: on statin with excellent LDL but with very low HDL that would not improve as long as he remains so obese. 12. PTSD: Problems with anxiety after his defibrillator discharges.  He seems to be much better compensated from this point of view today.   Medication Adjustments/Labs and Tests Ordered: Current medicines are reviewed at length with the patient today.  Concerns regarding medicines are outlined above.  Medication changes, Labs and Tests ordered today are listed in the Patient Instructions below. Patient Instructions  Medication Instructions:  Dr Royann Shivers has recommended making the following medication changes: 1. START Spironolactone 25 mg - take 1 tablet daily  If you need a refill on your cardiac medications before your next appointment, please call your pharmacy.   Lab work: Your physician recommends that you return for lab work TODAY and again in 1 month.  If you have labs (blood work) drawn today and your tests are completely normal, you will receive your results only by: Marland Kitchen MyChart Message (if you have MyChart) OR . A paper copy in the mail If you have any lab test that is abnormal or we need to change your treatment, we will call you to review the results.  Follow-Up: At [facility], you and your health needs are our priority.  As part of our continuing mission to provide you with exceptional heart care, we have created designated Provider Care Teams.  These Care Teams include your primary Cardiologist (physician) and Advanced Practice Providers (APPs -  Physician Assistants and Nurse Practitioners) who all work together to provide you with the care  you need, when you need it. You will need a follow up appointment in 6 months.  Please call our office 2 months in advance to schedule this appointment.  You may see Thurmon Fair, MD or one of the following Advanced Practice Providers on your designated Care Team: [place], [place] . Micah Flesher, PA-C . You will receive a reminder letter in the mail two months in advance. If you don't receive a letter, please call our office to schedule the follow-up appointment.      Signed, Thurmon Fair, MD  11/24/2018 4:17 PM    [facility] [address] [place], [place], [place]  [address] Phone: [phone]; Fax: [phone]

## 2018-11-28 ENCOUNTER — Other Ambulatory Visit (HOSPITAL_COMMUNITY): Payer: Medicare Other

## 2018-12-10 ENCOUNTER — Other Ambulatory Visit: Payer: Self-pay | Admitting: Cardiovascular Disease

## 2018-12-18 ENCOUNTER — Other Ambulatory Visit: Payer: Self-pay | Admitting: Cardiology

## 2018-12-18 NOTE — Telephone Encounter (Signed)
Rx request sent to pharmacy.  

## 2018-12-25 ENCOUNTER — Ambulatory Visit (INDEPENDENT_AMBULATORY_CARE_PROVIDER_SITE_OTHER): Payer: Medicare Other

## 2018-12-25 DIAGNOSIS — Z95810 Presence of automatic (implantable) cardiac defibrillator: Secondary | ICD-10-CM

## 2018-12-25 DIAGNOSIS — I5042 Chronic combined systolic (congestive) and diastolic (congestive) heart failure: Secondary | ICD-10-CM | POA: Diagnosis not present

## 2018-12-26 NOTE — Progress Notes (Signed)
EPIC Encounter for ICM Monitoring  Patient Name: [name] is a 73 y.o. male Date: 12/26/2018 Primary Care Physican: Devra Dopp, MD Primary Cardiologist: Croitoru/Bensimhon Electrophysiologist: Graciela Husbands Last Weight: 414 lbs at 11/24/18 office visit  Clinical Status (24-Nov-2018 to 25-Dec-2018)  Treated VT/VF 0 episodes   AT/AF 4 episodes   Time in AT/AF 0.3 hr/day (1.4%)   Longest AT/AF 7 hours Observations (2) (24-Nov-2018 to 25-Dec-2018)  AT/AF >= 6 hr for 1 days.  Patient Activity less than 1 hr/day for 4 weeks.        Heart Failure questions reviewed.   Pt asymptomatic.  He has stopped eating and drinking after 7 PM at night.   Report: Thoracic impedance normal but was abnormal suggesting fluid accumulation for month of December until 12/16/2018.   Prescribed: Torsemide 20 mg Take 2 tablets (40 mg total) by mouth 2 (two) times daily.  Recommendations: No changes.  Encouraged to call for fluid symptoms.  Follow-up plan: ICM clinic phone appointment on 01/29/2019.    Copy of ICM check sent to Dr. Graciela Husbands.   3 month ICM trend: 12/25/2018    1 Year ICM trend:       Karie Soda, RN 12/26/2018 12:49 PM

## 2019-01-03 LAB — CUP PACEART REMOTE DEVICE CHECK
Battery Remaining Longevity: 52 mo
Battery Voltage: 2.97 V
Brady Statistic AP VS Percent: 98.69 %
Brady Statistic AS VS Percent: 1.18 %
Date Time Interrogation Session: 20191120092304
HighPow Impedance: 44 Ohm
HighPow Impedance: 55 Ohm
Implantable Lead Implant Date: 20160520
Implantable Lead Implant Date: 20160520
Implantable Lead Location: 753860
Implantable Pulse Generator Implant Date: 20160520
Lead Channel Impedance Value: 418 Ohm
Lead Channel Pacing Threshold Amplitude: 0.75 V
Lead Channel Pacing Threshold Pulse Width: 0.4 ms
Lead Channel Sensing Intrinsic Amplitude: 4.25 mV
Lead Channel Sensing Intrinsic Amplitude: 4.25 mV
MDC IDC LEAD LOCATION: 753859
MDC IDC MSMT LEADCHNL RA IMPEDANCE VALUE: 456 Ohm
MDC IDC MSMT LEADCHNL RV IMPEDANCE VALUE: 361 Ohm
MDC IDC MSMT LEADCHNL RV PACING THRESHOLD AMPLITUDE: 1.125 V
MDC IDC MSMT LEADCHNL RV PACING THRESHOLD PULSEWIDTH: 0.4 ms
MDC IDC MSMT LEADCHNL RV SENSING INTR AMPL: 14 mV
MDC IDC MSMT LEADCHNL RV SENSING INTR AMPL: 14 mV
MDC IDC SET LEADCHNL RA PACING AMPLITUDE: 1.5 V
MDC IDC SET LEADCHNL RV PACING AMPLITUDE: 2.25 V
MDC IDC SET LEADCHNL RV PACING PULSEWIDTH: 0.4 ms
MDC IDC SET LEADCHNL RV SENSING SENSITIVITY: 0.3 mV
MDC IDC STAT BRADY AP VP PERCENT: 0.1 %
MDC IDC STAT BRADY AS VP PERCENT: 0.03 %
MDC IDC STAT BRADY RA PERCENT PACED: 97.49 %
MDC IDC STAT BRADY RV PERCENT PACED: 0.15 %

## 2019-01-09 LAB — CUP PACEART INCLINIC DEVICE CHECK
Date Time Interrogation Session: 20200121152706
Implantable Lead Implant Date: 20160520
Implantable Lead Location: 753859
Implantable Lead Location: 753860
MDC IDC LEAD IMPLANT DT: 20160520
MDC IDC PG IMPLANT DT: 20160520

## 2019-01-16 ENCOUNTER — Encounter (HOSPITAL_COMMUNITY): Payer: Medicare Other | Admitting: Internal Medicine

## 2019-01-17 LAB — CUP PACEART INCLINIC DEVICE CHECK
Date Time Interrogation Session: 20200129145519
Implantable Lead Implant Date: 20160520
Implantable Lead Location: 753860
Implantable Lead Model: 5076
MDC IDC LEAD IMPLANT DT: 20160520
MDC IDC LEAD LOCATION: 753859
MDC IDC PG IMPLANT DT: 20160520

## 2019-01-29 ENCOUNTER — Ambulatory Visit (INDEPENDENT_AMBULATORY_CARE_PROVIDER_SITE_OTHER): Payer: Medicare Other

## 2019-01-29 DIAGNOSIS — Z95810 Presence of automatic (implantable) cardiac defibrillator: Secondary | ICD-10-CM

## 2019-01-29 DIAGNOSIS — I5042 Chronic combined systolic (congestive) and diastolic (congestive) heart failure: Secondary | ICD-10-CM | POA: Diagnosis not present

## 2019-01-29 NOTE — Progress Notes (Signed)
EPIC Encounter for ICM Monitoring  Patient Name: [name] is a 73 y.o. male Date: 01/29/2019 Primary Care Physican: Devra Dopp, MD Primary Cardiologist: Croitoru/Bensimhon Electrophysiologist: Graciela Husbands Last EVOJJK:093 lbs at 11/24/18 office visit  Clinical Status (25-Dec-2018 to 29-Jan-2019)  Treated VT/VF 0 episodes   AT/AF 1 episode   Time in AT/AF 0.2 hr/day (0.9%)   Longest AT/AF 7 hours  Observations (2) (25-Dec-2018 to 29-Jan-2019)  AT/AF >= 6 hr for 1 days.  Patient Activity less than 1 hr/day for 5 weeks.         Heart failure questions reviewed.  Pt asymptomatic.     Report: Thoracic impedance normal.   Prescribed: Torsemide20 mgTake 2 tablets (40 mg total) by mouth 2 (two) times daily.  Recommendations:  No changes.  Encouraged to call for fluid symptoms.  Follow-up plan: ICM clinic phone appointment on 03/05/2019.    Copy of ICM check sent to Dr. Graciela Husbands.  3 month ICM trend: 01/29/2019    1 Year ICM trend:       Karie Soda, RN 01/29/2019 1:11 PM

## 2019-02-07 ENCOUNTER — Ambulatory Visit (INDEPENDENT_AMBULATORY_CARE_PROVIDER_SITE_OTHER): Payer: Medicare Other

## 2019-02-07 DIAGNOSIS — R001 Bradycardia, unspecified: Secondary | ICD-10-CM | POA: Diagnosis not present

## 2019-02-07 DIAGNOSIS — I5042 Chronic combined systolic (congestive) and diastolic (congestive) heart failure: Secondary | ICD-10-CM

## 2019-02-08 ENCOUNTER — Other Ambulatory Visit: Payer: Self-pay | Admitting: Cardiovascular Disease

## 2019-02-09 LAB — CUP PACEART REMOTE DEVICE CHECK
Battery Voltage: 2.97 V
Battery Voltage: 2.97 V
Brady Statistic AP VP Percent: 0.09 %
Brady Statistic AP VP Percent: 0.08 %
Brady Statistic AP VS Percent: 98.83 %
Brady Statistic AS VP Percent: 0.67 %
Brady Statistic AS VP Percent: 0 %
Brady Statistic AS VS Percent: 0.42 %
Brady Statistic AS VS Percent: 0.2 %
Brady Statistic RA Percent Paced: 97.24 %
Brady Statistic RA Percent Paced: 97.86 %
Date Time Interrogation Session: 20200210072825
Date Time Interrogation Session: 20200219051703
HighPow Impedance: 42 Ohm
HighPow Impedance: 53 Ohm
HighPow Impedance: 46 Ohm
HighPow Impedance: 58 Ohm
Implantable Lead Implant Date: 20160520
Implantable Lead Implant Date: 20160520
Implantable Lead Location: 753859
Implantable Lead Location: 753860
Implantable Lead Location: 753859
Implantable Lead Location: 753860
Implantable Lead Model: 5076
Implantable Lead Model: 5076
Lead Channel Impedance Value: 456 Ohm
Lead Channel Impedance Value: 456 Ohm
Lead Channel Impedance Value: 361 Ohm
Lead Channel Impedance Value: 418 Ohm
Lead Channel Pacing Threshold Amplitude: 0.625 V
Lead Channel Pacing Threshold Amplitude: 1 V
Lead Channel Pacing Threshold Amplitude: 1.125 V
Lead Channel Pacing Threshold Pulse Width: 0.4 ms
Lead Channel Sensing Intrinsic Amplitude: 14.375 mV
Lead Channel Sensing Intrinsic Amplitude: 4.75 mV
Lead Channel Sensing Intrinsic Amplitude: 4.75 mV
Lead Channel Sensing Intrinsic Amplitude: 14.375 mV
Lead Channel Sensing Intrinsic Amplitude: 14.375 mV
Lead Channel Sensing Intrinsic Amplitude: 4.5 mV
Lead Channel Setting Pacing Amplitude: 2 V
Lead Channel Setting Pacing Amplitude: 1.5 V
Lead Channel Setting Pacing Amplitude: 2.5 V
Lead Channel Setting Pacing Pulse Width: 0.4 ms
Lead Channel Setting Pacing Pulse Width: 0.4 ms
Lead Channel Setting Sensing Sensitivity: 0.3 mV
MDC IDC LEAD IMPLANT DT: 20160520
MDC IDC LEAD IMPLANT DT: 20160520
MDC IDC MSMT BATTERY REMAINING LONGEVITY: 48 mo
MDC IDC MSMT BATTERY REMAINING LONGEVITY: 48 mo
MDC IDC MSMT LEADCHNL RA IMPEDANCE VALUE: 456 Ohm
MDC IDC MSMT LEADCHNL RA PACING THRESHOLD AMPLITUDE: 0.625 V
MDC IDC MSMT LEADCHNL RA PACING THRESHOLD PULSEWIDTH: 0.4 ms
MDC IDC MSMT LEADCHNL RA PACING THRESHOLD PULSEWIDTH: 0.4 ms
MDC IDC MSMT LEADCHNL RA SENSING INTR AMPL: 4.5 mV
MDC IDC MSMT LEADCHNL RV IMPEDANCE VALUE: 342 Ohm
MDC IDC MSMT LEADCHNL RV PACING THRESHOLD PULSEWIDTH: 0.4 ms
MDC IDC MSMT LEADCHNL RV SENSING INTR AMPL: 14.375 mV
MDC IDC PG IMPLANT DT: 20160520
MDC IDC PG IMPLANT DT: 20160520
MDC IDC SET LEADCHNL RA PACING AMPLITUDE: 1.75 V
MDC IDC SET LEADCHNL RV SENSING SENSITIVITY: 0.3 mV
MDC IDC STAT BRADY AP VS PERCENT: 99.72 %
MDC IDC STAT BRADY RV PERCENT PACED: 0.74 %
MDC IDC STAT BRADY RV PERCENT PACED: 0.08 %

## 2019-02-15 NOTE — Progress Notes (Signed)
Remote ICD transmission.   

## 2019-02-16 ENCOUNTER — Encounter: Payer: Self-pay | Admitting: Cardiology

## 2019-02-27 ENCOUNTER — Other Ambulatory Visit (HOSPITAL_COMMUNITY): Payer: Self-pay

## 2019-02-27 MED ORDER — TORSEMIDE 20 MG PO TABS: 40.0000 mg | ORAL_TABLET | Freq: Two times a day (BID) | ORAL | 0 refills | Status: DC

## 2019-02-27 MED ORDER — POTASSIUM CHLORIDE CRYS ER 20 MEQ PO TBCR: 20.0000 meq | EXTENDED_RELEASE_TABLET | Freq: Two times a day (BID) | ORAL | 0 refills | Status: DC

## 2019-03-05 ENCOUNTER — Ambulatory Visit (INDEPENDENT_AMBULATORY_CARE_PROVIDER_SITE_OTHER): Payer: Medicare Other

## 2019-03-05 ENCOUNTER — Other Ambulatory Visit (HOSPITAL_COMMUNITY): Payer: Self-pay

## 2019-03-05 DIAGNOSIS — Z95810 Presence of automatic (implantable) cardiac defibrillator: Secondary | ICD-10-CM

## 2019-03-05 DIAGNOSIS — I5042 Chronic combined systolic (congestive) and diastolic (congestive) heart failure: Secondary | ICD-10-CM | POA: Diagnosis not present

## 2019-03-06 NOTE — Progress Notes (Signed)
EPIC Encounter for ICM Monitoring  Patient Name: [name] is a 73 y.o. male Date: 03/06/2019 Primary Care Physican: Devra Dopp, MD Primary Cardiologist: Croitoru/Bensimhon Electrophysiologist: Graciela Husbands Last Weight:414lbs at 11/24/18 office visit  Clinical Status (07-Feb-2019 to 05-Mar-2019)  Treated VT/VF 0 episodes   AT/AF 2 episodes   Time in AT/AF 0.4 hr/day (1.5%)   Longest AT/AF 9 hours  Observations (2) (07-Feb-2019 to 05-Mar-2019)  AT/AF >= 6 hr for 1 days.  Patient Activity less than 1 hr/day for 3 weeks.   Heart failure questions reviewed.  Pt asymptomatic.  Report: Thoracic impedance normal.   Prescribed:Torsemide20 mgTake2tablets (40 mg total) by mouth 2 (two) times daily.  Recommendations: No changes. Encouraged to call for fluid symptoms.  Follow-up plan: ICM clinic phone appointment on4/16/2020. Office appt 03/27/2019 with Dr. Gala Romney and 04/26/2019 with Dr Graciela Husbands.    Copy of ICM check sent to Dr. Graciela Husbands.     3 month ICM trend: 03/05/2019    1 Year ICM trend:       Karie Soda, RN 03/06/2019 12:25 PM

## 2019-03-07 ENCOUNTER — Other Ambulatory Visit (HOSPITAL_COMMUNITY): Payer: Self-pay | Admitting: *Deleted

## 2019-03-07 MED ORDER — TORSEMIDE 20 MG PO TABS: 40.0000 mg | ORAL_TABLET | Freq: Two times a day (BID) | ORAL | 0 refills | Status: DC

## 2019-03-26 ENCOUNTER — Other Ambulatory Visit (HOSPITAL_COMMUNITY): Payer: Self-pay | Admitting: Internal Medicine

## 2019-03-27 ENCOUNTER — Ambulatory Visit (HOSPITAL_COMMUNITY)
Admission: RE | Admit: 2019-03-27 | Discharge: 2019-03-27 | Disposition: A | Discharge status: Home / Self Care | Payer: Medicare Other | Source: Ambulatory Visit | Attending: Internal Medicine | Admitting: Internal Medicine

## 2019-03-27 ENCOUNTER — Other Ambulatory Visit: Payer: Self-pay

## 2019-03-27 ENCOUNTER — Encounter (HOSPITAL_COMMUNITY): Payer: Medicare Other | Admitting: Internal Medicine

## 2019-03-27 DIAGNOSIS — I25810 Atherosclerosis of coronary artery bypass graft(s) without angina pectoris: Secondary | ICD-10-CM

## 2019-03-27 DIAGNOSIS — I5042 Chronic combined systolic (congestive) and diastolic (congestive) heart failure: Secondary | ICD-10-CM

## 2019-03-27 MED ORDER — METOPROLOL SUCCINATE ER 50 MG PO TB24: ORAL_TABLET | ORAL | 3 refills | Status: DC

## 2019-03-27 MED ORDER — AMIODARONE HCL 200 MG PO TABS: 200.0000 mg | ORAL_TABLET | Freq: Every day | ORAL | 3 refills | Status: DC

## 2019-03-27 MED ORDER — TORSEMIDE 20 MG PO TABS: 40.0000 mg | ORAL_TABLET | Freq: Two times a day (BID) | ORAL | 0 refills | Status: DC

## 2019-03-27 MED ORDER — ATORVASTATIN CALCIUM 80 MG PO TABS: 80.0000 mg | ORAL_TABLET | Freq: Every day | ORAL | 3 refills | Status: DC

## 2019-03-27 MED ORDER — POTASSIUM CHLORIDE CRYS ER 20 MEQ PO TBCR: EXTENDED_RELEASE_TABLET | ORAL | 3 refills | Status: DC

## 2019-03-27 MED ORDER — CLOPIDOGREL BISULFATE 75 MG PO TABS: 75.0000 mg | ORAL_TABLET | Freq: Every day | ORAL | 3 refills | Status: DC

## 2019-03-27 NOTE — Progress Notes (Signed)
Heart Failure TeleHealth Note  Due to national recommendations of social distancing due to COVID 19, Audio/video telehealth visit is felt to be most appropriate for this patient at this time.  See MyChart message from today for patient consent regarding telehealth for [facility].  Date:  03/27/2019   ID:  [name], DOB 08-05-1946, MRN [id number]  Location: Home  Provider location: [address], [place] Type of Visit: Established patient  PCP:  Devra DoppHowell, Tamieka, MD  Cardiologist:  Thurmon FairMihai Croitoru, MD Primary HF: Dr Gala RomneyBensimhon   Chief Complaint: Heart Failure    History of Present Illness: [name] is a 73 y.o. male who presents via audio conferencing for a telehealth visit today.     [name] is a 73 y.o. male with a history of morbid obesity, chronic systolic HF due to ICM (EF 15-20%), OSA not on CPAP, VT s/p MDT ICD, PAF, DM, CAD s/p CABG 1995 and multiple PCIs (most recent 07/24/18), PTSD.   LHC 07/24/18:  Prox RCA lesion is 100% stenosed.  Ost LAD to Prox LAD lesion is 100% stenosed.  Ost Cx to Prox Cx lesion is 100% stenosed.  Origin lesion is 100% stenosed.  Origin to Prox Graft lesion is 100% stenosed.  Mid Graft to Insertion lesion is 95% stenosed.  Mid LAD to Dist LAD lesion is 100% stenosed. LHC 07/25/18: DES dist graft to insertion lesion  ECHO 10/2018 EF ~35%   Overall he is doing ok. Today, he denies symptoms of palpitations, chest pain, shortness of breath, orthopnea, PND, lower extremity edema, claudication, dizziness, presyncope, syncope, or bleeding. Not moving around much. Stays in the house most of the time. Weight at home has been >400 pounds. . Appetite good. The patient is tolerating medications without difficulties and is otherwise without complaint today.  His wife is able to fill his pill box.   He denies symptoms of cough, fevers, chills, or new SOB worrisome for COVID 19.   Past Medical History:  Diagnosis Date   . CAD (coronary artery disease)    a. CABG 1995 and multiple stents. b. Known graft occlusions - NSTEMI 06/2016 s/p DES to distal SVG-diag.  . Chronic systolic CHF (congestive heart failure) (HCC)   . CKD (chronic kidney disease), stage III (HCC)   . Dietary noncompliance   . DM (diabetes mellitus) (HCC)   . DM type 2 causing CKD stage 3 (HCC)   . Dyslipidemia - low HDL and high triglycerides   . Gout   . Ischemic cardiomyopathy    a. LVEF 10-15% by cath 06/2016.  Marland Kitchen. Leukocytosis   . Morbid obesity (HCC)   . Myocardial infarction (HCC)   . NSTEMI (non-ST elevated myocardial infarction) (HCC) 06/26/2016  . PAF (paroxysmal atrial fibrillation) (HCC)   . PONV (postoperative nausea and vomiting)   . Systemic hypertension   . Unstable angina (HCC) 07/21/2018   Past Surgical History:  Procedure Laterality Date  . CARDIAC CATHETERIZATION N/A 06/28/2016   Procedure: Left Heart Cath and Cors/Grafts Angiography;  Surgeon: Lennette Biharihomas A Kelly, MD;  [facility];  Service: Cardiovascular;  Laterality: N/A;  . CARDIAC CATHETERIZATION N/A 06/29/2016   Procedure: Coronary Stent Intervention;  Surgeon: Lennette Biharihomas A Kelly, MD;  Location: [facility] INVASIVE CV LAB;  Service: Cardiovascular;  Laterality: N/A;  . CORONARY ANGIOPLASTY WITH STENT PLACEMENT  01/12/2008   multivessel CAD occluded vein grafts to multiple sites including RCA,obtuse marginal branch & CX.  Successful PTCA  and stenting distal graft.  . CORONARY ARTERY BYPASS GRAFT  1995  . CORONARY BALLOON ANGIOPLASTY N/A 03/16/2017   Procedure: Coronary Balloon Angioplasty;  Surgeon: Corky Crafts, MD;  Location: [facility] INVASIVE CV LAB;  Service: Cardiovascular;  Laterality: N/A;  . CORONARY STENT INTERVENTION N/A 07/25/2018   Procedure: CORONARY STENT INTERVENTION;  Surgeon: Corky Crafts, MD;  Location: [facility] INVASIVE CV LAB;  Service: Cardiovascular;  Laterality: N/A;  . EP IMPLANTABLE DEVICE N/A 05/09/2015   Procedure: Icd Implant;  Surgeon:  Thurmon Fair, MD;  Location: [facility] INVASIVE CV LAB;  Service: Cardiovascular;  Laterality: N/A;  . LEFT HEART CATH AND CORS/GRAFTS ANGIOGRAPHY N/A 03/14/2017   Procedure: Left Heart Cath and Cors/Grafts Angiography;  Surgeon: Corky Crafts, MD;  Location: [facility] INVASIVE CV LAB;  Service: Cardiovascular;  Laterality: N/A;  . LEFT HEART CATH AND CORS/GRAFTS ANGIOGRAPHY N/A 07/24/2018   Procedure: LEFT HEART CATH AND CORS/GRAFTS ANGIOGRAPHY;  Surgeon: Runell Gess, MD;  Location: [facility] INVASIVE CV LAB;  Service: Cardiovascular;  Laterality: N/A;  . LEFT HEART CATHETERIZATION WITH CORONARY ANGIOGRAM N/A 01/29/2015   Procedure: LEFT HEART CATHETERIZATION WITH CORONARY ANGIOGRAM;  Surgeon: Lennette Bihari, MD;  Location: [facility] CATH LAB;  Service: Cardiovascular;  Laterality: N/A;  . THORACIC AORTOGRAM N/A 03/16/2017   Procedure: Thoracic Aortogram;  Surgeon: Corky Crafts, MD;  Location: [facility] INVASIVE CV LAB;  Service: Cardiovascular;  Laterality: N/A;  . TONSILLECTOMY       Current Outpatient Medications  Medication Sig Dispense Refill  . acetaminophen (TYLENOL) 325 MG tablet Take 1-2 tablets (325-650 mg total) by mouth every 4 (four) hours as needed for mild pain.    Marland Kitchen allopurinol (ZYLOPRIM) 100 MG tablet Take 100 mg by mouth daily.    Marland Kitchen amiodarone (PACERONE) 200 MG tablet Take 1 tablet (200 mg total) by mouth daily. 90 tablet 3  . atorvastatin (LIPITOR) 80 MG tablet TAKE 1 TABLET BY MOUTH ONCE DAILY 90 tablet 1  . cholecalciferol (VITAMIN D) 1000 UNITS tablet Take 1,000 Units by mouth every other day.     . clopidogrel (PLAVIX) 75 MG tablet TAKE 1 TABLET BY MOUTH DAILY 90 tablet 3  . ELIQUIS 5 MG TABS tablet TAKE 1 TABLET BY MOUTH TWICE A DAY 60 tablet 10  . ENTRESTO 24-26 MG TAKE 1 TABLET BY MOUTH TWICE DAILY 60 tablet 6  . Hyaluronan (ORTHOVISC) 30 MG/2ML SOSY Inject 30 mg into the articular space daily. Every six months. Last dose was in August 2019    . insulin glargine (LANTUS) 100 UNIT/ML  injection Inject 0.5 mLs (50 Units total) into the skin every morning. 10 mL 11  . insulin lispro (HUMALOG KWIKPEN) 100 UNIT/ML KiwkPen Inject 20 Units into the skin every morning.     . INVOKANA 100 MG TABS Take 100 mg by mouth daily.    . isosorbide mononitrate (IMDUR) 30 MG 24 hr tablet TAKE 1 TABLET(30 MG) BY MOUTH DAILY 30 tablet 6  . metoprolol succinate (TOPROL-XL) 50 MG 24 hr tablet TAKE 1 TABLET(50 MG) BY MOUTH DAILY 90 tablet 2  . Omega-3 Fatty Acids (FISH OIL) 1200 MG CAPS Take 1,200 mg by mouth daily.     . potassium chloride SA (K-DUR,KLOR-CON) 20 MEQ tablet TAKE 1 TABLET(20 MEQ) BY MOUTH TWICE DAILY NEED APPOINTMENT FOR FUTURE REFILLS 60 tablet 0  . sertraline (ZOLOFT) 50 MG tablet Take 1 tablet (50 mg total) by mouth daily. 90 tablet 0  . spironolactone (ALDACTONE) 25 MG tablet Take 1  tablet (25 mg total) by mouth daily. 90 tablet 3  . torsemide (DEMADEX) 20 MG tablet Take 2 tablets (40 mg total) by mouth 2 (two) times daily. Need appt with Dr Gala Romney for future refills 120 tablet 0  . VICTOZA 18 MG/3ML SOPN Inject 1.2 mg into the skin daily.  0  . ENTRESTO 24-26 MG TAKE 1 TABLET BY MOUTH TWICE DAILY 180 tablet 1  . nitroGLYCERIN (NITROSTAT) 0.4 MG SL tablet PLACE 1 TABLET UNDER THE TONGUE AS NEEDED EVERY 5 MINUTES FOR CHEST PAIN (Patient not taking: Reported on 03/27/2019) 25 tablet 6   No current facility-administered medications for this encounter.     Allergies:   Coreg [carvedilol]   Social History:  The patient  reports that he quit smoking about 2 years ago. His smoking use included cigarettes. He has never used smokeless tobacco. He reports previous alcohol use. He reports that he does not use drugs.   Family History:  The patient's family history includes Dementia in his mother; Hearing loss in an other family member; Heart failure in his mother; Hypertension in an other family member.   ROS:  Please see the history of present illness.   All other systems are personally  reviewed and negative.   Exam:  Tele Health Call; Exam is subjective General: No resp difficulty. Lungs: Normal respiratory effort with conversation.  Abdomen: Non-distended. Pt denies tenderness with self palpation.  Extremities: Pt denies edema. Neuro: Alert & oriented x 3.   Recent Labs: 06/08/2018: Magnesium 2.3 09/21/2018: Hemoglobin 8.6; Platelets 201 10/25/2018: B Natriuretic Peptide 1,051.3; TSH 4.199 11/24/2018: ALT 15; BUN 19; Creatinine, Ser 1.48; Potassium 4.4; Sodium 139  Personally reviewed   Wt Readings from Last 3 Encounters:  11/24/18 (!) 187.8 kg (414 lb)  11/14/18 (!) 191.5 kg (422 lb 3.2 oz)  10/25/18 (!) 196.4 kg (433 lb)      Other studies personally reviewed: Additional studies/ records that were reviewed today include: LHC 07/2018   ASSESSMENT AND PLAN:  1.  Chronic Systolic Heart Failure, IICM  ECHO per Dr Gala Romney EF ~ 35%.  NYHA III. Volume status sound stable. Continue current HF medications. No change for now.  2. CAD  CABG  1995  PCI DES 07/2018  Continue plavix and eliquis. Will need to continue plavix for 12 months per Dr Eldridge Dace  3. Obesity  Weight has been >400 pounds. Reinforced weight loss.   COVID screen The patient does not have any symptoms that suggest any further testing/ screening at this time.  Social distancing reinforced today.  Recommended follow-up: 3-4 months.    Relevant cardiac medications were reviewed at length with the patient today.   The patient does not have concerns regarding their medications at this time.  Refill with 90 day supply. Potassium, torsemide, plavix, atorvastatin, amiodarone, toprol   The following changes were made today: none  Labs/ tests ordered today include: none   Patient Risk: After full review of this patients clinical status, I feel that they are at moderate risk for cardiac decompensation at this time.  Today, I have spent 20 minutes with the patient with telehealth technology discussing  the above.     Waneta Martins, NP  03/27/2019 2:24 PM  [facility] [address] [facility] [place] [address] [phone] (office) [phone] (fax)

## 2019-03-27 NOTE — Addendum Note (Signed)
Encounter addended by: Shea Stakes, RN on: 03/27/2019 2:38 PM  Actions taken: Order list changed

## 2019-03-27 NOTE — Patient Instructions (Addendum)
   Refill with 90 day supply  Potassium, torsemide, plavix, atorvastatin, amiodarone, toprol    Follow up in 3-4 months Dr Gala Romney.

## 2019-04-05 ENCOUNTER — Ambulatory Visit (INDEPENDENT_AMBULATORY_CARE_PROVIDER_SITE_OTHER): Payer: Medicare Other

## 2019-04-05 ENCOUNTER — Other Ambulatory Visit: Payer: Self-pay

## 2019-04-05 DIAGNOSIS — Z95810 Presence of automatic (implantable) cardiac defibrillator: Secondary | ICD-10-CM

## 2019-04-05 DIAGNOSIS — I5042 Chronic combined systolic (congestive) and diastolic (congestive) heart failure: Secondary | ICD-10-CM

## 2019-04-06 NOTE — Progress Notes (Signed)
EPIC Encounter for ICM Monitoring  Patient Name: [name] is a 73 y.o. male Date: 04/06/2019 Primary Care Physican: Devra Dopp, MD Primary Cardiologist: Croitoru/Bensimhon Electrophysiologist: Graciela Husbands Last [id number] at 11/24/18 office visit   Heart failure questions reviewed. Pt asymptomatic.  Report: Thoracic impedance normal.   Prescribed:Torsemide20 mgTake2tablets (40 mg total) by mouth 2 (two) times daily.  Recommendations:No changes. Encouraged to call for fluid symptoms.  Follow-up plan: ICM clinic phone appointment on5/19/2020. Office appt 04/26/2019 with Dr. Graciela Husbands  Copy of ICM check sent to Dr. Graciela Husbands.   3 month ICM trend: 04/05/2019    1 Year ICM trend:       Karie Soda, RN 04/06/2019 4:26 PM

## 2019-04-20 ENCOUNTER — Telehealth: Payer: Self-pay

## 2019-04-20 NOTE — Telephone Encounter (Signed)
Called and left patient a message about upcoming appointment with Dr. Graciela Husbands. We need to switch this to a video or phone visit.

## 2019-04-24 NOTE — Telephone Encounter (Signed)
I tried to call patient again about upcoming appointment. I left a message. We need to get this switched over to a video visit/telephone visit.

## 2019-04-26 ENCOUNTER — Telehealth: Payer: Self-pay | Admitting: Internal Medicine

## 2019-04-26 ENCOUNTER — Encounter: Payer: Self-pay | Admitting: Internal Medicine

## 2019-04-26 ENCOUNTER — Telehealth (INDEPENDENT_AMBULATORY_CARE_PROVIDER_SITE_OTHER): Payer: Medicare Other | Admitting: Internal Medicine

## 2019-04-26 ENCOUNTER — Other Ambulatory Visit: Payer: Self-pay

## 2019-04-26 VITALS — BP 128/66 | HR 62 | Ht >= 80 in | Wt >= 6400 oz

## 2019-04-26 DIAGNOSIS — I480 Paroxysmal atrial fibrillation: Secondary | ICD-10-CM

## 2019-04-26 DIAGNOSIS — I5042 Chronic combined systolic (congestive) and diastolic (congestive) heart failure: Secondary | ICD-10-CM

## 2019-04-26 DIAGNOSIS — D649 Anemia, unspecified: Secondary | ICD-10-CM

## 2019-04-26 DIAGNOSIS — Z95810 Presence of automatic (implantable) cardiac defibrillator: Secondary | ICD-10-CM

## 2019-04-26 DIAGNOSIS — R0902 Hypoxemia: Secondary | ICD-10-CM

## 2019-04-26 NOTE — Telephone Encounter (Signed)
Follow Up:    Pt said he missed his call, he was sleep.

## 2019-04-26 NOTE — Progress Notes (Signed)
Electrophysiology TeleHealth Note   Due to national recommendations of social distancing due to COVID 19, an audio/video telehealth visit is felt to be most appropriate for this patient at this time.  See MyChart message from today for the patient's consent to telehealth for [facility].   Date:  04/26/2019   ID:  [name], DOB 1946/10/22, MRN [id number]  Location: patient's home  Provider location: [address], [place]  Evaluation Performed: Follow-up visit  PCP:  Devra Dopp, MD  Cardiologist:  MCr//DB  Electrophysiologist:  SK   Chief Complaint:  Ventricular tachycardia   History of Present Illness:    [name] is a 73 y.o. male who presents via audio/video conferencing for a telehealth visit today.  Since last being seen in our clinic, the patient reports doing pretty well  History of ischemic heart disease with prior bypass surgery with interval stenting.  On clopidogrel.  History of atrial fibrillation-paroxysmal.  On Eliquis.  Date Cr K TSH LFTs Hgb  5/18  1.68 4.1 5.97      12/19 1.48 4.4 4.199 15 8.6 (10/19)      The patient denies symptoms of fevers, chills, cough, or new SOB worrisome for COVID 19.    Past Medical History:  Diagnosis Date  . CAD (coronary artery disease)    a. CABG 1995 and multiple stents. b. Known graft occlusions - NSTEMI 06/2016 s/p DES to distal SVG-diag.  . Chronic systolic CHF (congestive heart failure) (HCC)   . CKD (chronic kidney disease), stage III (HCC)   . Dietary noncompliance   . DM (diabetes mellitus) (HCC)   . DM type 2 causing CKD stage 3 (HCC)   . Dyslipidemia - low HDL and high triglycerides   . Gout   . Ischemic cardiomyopathy    a. LVEF 10-15% by cath 06/2016.  Marland Kitchen Leukocytosis   . Morbid obesity (HCC)   . Myocardial infarction (HCC)   . NSTEMI (non-ST elevated myocardial infarction) (HCC) 06/26/2016  . PAF (paroxysmal atrial fibrillation) (HCC)   . PONV (postoperative nausea  and vomiting)   . Systemic hypertension   . Unstable angina (HCC) 07/21/2018    Past Surgical History:  Procedure Laterality Date  . CARDIAC CATHETERIZATION N/A 06/28/2016   Procedure: Left Heart Cath and Cors/Grafts Angiography;  Surgeon: Lennette Bihari, MD;  Location: [facility] INVASIVE CV LAB;  Service: Cardiovascular;  Laterality: N/A;  . CARDIAC CATHETERIZATION N/A 06/29/2016   Procedure: Coronary Stent Intervention;  Surgeon: Lennette Bihari, MD;  Location: [facility] INVASIVE CV LAB;  Service: Cardiovascular;  Laterality: N/A;  . CORONARY ANGIOPLASTY WITH STENT PLACEMENT  01/12/2008   multivessel CAD occluded vein grafts to multiple sites including RCA,obtuse marginal branch & CX.  Successful PTCA and stenting distal graft.  . CORONARY ARTERY BYPASS GRAFT  1995  . CORONARY BALLOON ANGIOPLASTY N/A 03/16/2017   Procedure: Coronary Balloon Angioplasty;  Surgeon: Corky Crafts, MD;  Location: [facility] INVASIVE CV LAB;  Service: Cardiovascular;  Laterality: N/A;  . CORONARY STENT INTERVENTION N/A 07/25/2018   Procedure: CORONARY STENT INTERVENTION;  Surgeon: Corky Crafts, MD;  Location: [facility] INVASIVE CV LAB;  Service: Cardiovascular;  Laterality: N/A;  . EP IMPLANTABLE DEVICE N/A 05/09/2015   Procedure: Icd Implant;  Surgeon: Thurmon Fair, MD;  Location: [facility] INVASIVE CV LAB;  Service: Cardiovascular;  Laterality: N/A;  . LEFT HEART CATH AND CORS/GRAFTS ANGIOGRAPHY N/A 03/14/2017   Procedure: Left Heart Cath and Cors/Grafts Angiography;  Surgeon: Renelda Loma  Hoyle Barr, MD;  Location: [facility] INVASIVE CV LAB;  Service: Cardiovascular;  Laterality: N/A;  . LEFT HEART CATH AND CORS/GRAFTS ANGIOGRAPHY N/A 07/24/2018   Procedure: LEFT HEART CATH AND CORS/GRAFTS ANGIOGRAPHY;  Surgeon: Runell Gess, MD;  Location: [facility] INVASIVE CV LAB;  Service: Cardiovascular;  Laterality: N/A;  . LEFT HEART CATHETERIZATION WITH CORONARY ANGIOGRAM N/A 01/29/2015   Procedure: LEFT HEART CATHETERIZATION WITH CORONARY ANGIOGRAM;  Surgeon:  Lennette Bihari, MD;  Location: [facility] CATH LAB;  Service: Cardiovascular;  Laterality: N/A;  . THORACIC AORTOGRAM N/A 03/16/2017   Procedure: Thoracic Aortogram;  Surgeon: Corky Crafts, MD;  Location: [facility] INVASIVE CV LAB;  Service: Cardiovascular;  Laterality: N/A;  . TONSILLECTOMY      Current Outpatient Medications  Medication Sig Dispense Refill  . acetaminophen (TYLENOL) 325 MG tablet Take 1-2 tablets (325-650 mg total) by mouth every 4 (four) hours as needed for mild pain.    Marland Kitchen allopurinol (ZYLOPRIM) 100 MG tablet Take 100 mg by mouth daily.    Marland Kitchen amiodarone (PACERONE) 200 MG tablet Take 1 tablet (200 mg total) by mouth daily. 90 tablet 3  . atorvastatin (LIPITOR) 80 MG tablet Take 1 tablet (80 mg total) by mouth daily. 90 tablet 3  . cholecalciferol (VITAMIN D) 1000 UNITS tablet Take 1,000 Units by mouth every other day.     . clopidogrel (PLAVIX) 75 MG tablet Take 1 tablet (75 mg total) by mouth daily. 90 tablet 3  . ELIQUIS 5 MG TABS tablet TAKE 1 TABLET BY MOUTH TWICE A DAY 60 tablet 10  . ENTRESTO 24-26 MG TAKE 1 TABLET BY MOUTH TWICE DAILY 60 tablet 6  . Hyaluronan (ORTHOVISC) 30 MG/2ML SOSY Inject 30 mg into the articular space daily. Every six months. Last dose was in August 2019    . insulin glargine (LANTUS) 100 UNIT/ML injection Inject 0.5 mLs (50 Units total) into the skin every morning. 10 mL 11  . insulin lispro (HUMALOG KWIKPEN) 100 UNIT/ML KiwkPen Inject 20 Units into the skin every morning.     . INVOKANA 100 MG TABS Take 100 mg by mouth daily.    . isosorbide mononitrate (IMDUR) 30 MG 24 hr tablet TAKE 1 TABLET(30 MG) BY MOUTH DAILY 30 tablet 6  . metoprolol succinate (TOPROL-XL) 50 MG 24 hr tablet TAKE 1 TABLET(50 MG) BY MOUTH DAILY 90 tablet 3  . nitroGLYCERIN (NITROSTAT) 0.4 MG SL tablet PLACE 1 TABLET UNDER THE TONGUE AS NEEDED EVERY 5 MINUTES FOR CHEST PAIN 25 tablet 6  . Omega-3 Fatty Acids (FISH OIL) 1200 MG CAPS Take 1,200 mg by mouth daily.     . potassium  chloride SA (K-DUR,KLOR-CON) 20 MEQ tablet TAKE 1 TABLET(20 MEQ) BY MOUTH TWICE DAILY 180 tablet 3  . sertraline (ZOLOFT) 50 MG tablet Take 1 tablet (50 mg total) by mouth daily. 90 tablet 0  . spironolactone (ALDACTONE) 25 MG tablet Take 1 tablet (25 mg total) by mouth daily. 90 tablet 3  . torsemide (DEMADEX) 20 MG tablet Take 2 tablets (40 mg total) by mouth 2 (two) times daily. Need appt with Dr Gala Romney for future refills 360 tablet 0  . VICTOZA 18 MG/3ML SOPN Inject 1.2 mg into the skin daily.  0   No current facility-administered medications for this visit.     Allergies:   Coreg [carvedilol]   Social History:  The patient  reports that he quit smoking about 2 years ago. His smoking use included cigarettes. He has never used smokeless tobacco.  He reports previous alcohol use. He reports that he does not use drugs.   Family History:  The patient's   family history includes Dementia in his mother; Hearing loss in an other family member; Heart failure in his mother; Hypertension in an other family member.   ROS:  Please see the history of present illness.   All other systems are personally reviewed and negative.    Exam:    Vital Signs:  BP 128/66   Pulse 62   Ht 6\' 8"  (2.032 m)   Wt (!) 408 lb (185.1 kg)   BMI 44.82 kg/m     Well appearing, alert and conversant, regular work of breathing,  good skin color Eyes- anicteric, neuro- grossly intact, skin- no apparent rash or lesions or cyanosis, mouth- oral mucosa is pink   Labs/Other Tests and Data Reviewed:        Recent Labs: 06/08/2018: Magnesium 2.3 09/21/2018: Hemoglobin 8.6; Platelets 201 10/25/2018: B Natriuretic Peptide 1,051.3; TSH 4.199 11/24/2018: ALT 15; BUN 19; Creatinine, Ser 1.48; Potassium 4.4; Sodium 139   Wt Readings from Last 3 Encounters:  04/26/19 (!) 408 lb (185.1 kg)  11/24/18 (!) 414 lb (187.8 kg)  11/14/18 (!) 422 lb 3.2 oz (191.5 kg)     Other studies personally reviewed: Additional studies/  records that were reviewed today include: As above   Review of the above records today demonstrates: As above     Last device remote is reviewed from [employer] PDF dated 2/20which reveals normal device function,   arrhythmias - interval atrial fibrillation with events up to 12 hrs  New over recent months   ASSESSMENT & PLAN:    Ischemic cardiomyopathy  Ventricular tachycardia  [employer]onic defibrillator  The patient's device was interrogated.  The information was reviewed. No changes were made in the programming.      High Risk Medication Surveillance  Atrial fibrillation paroxysmal  PVCs  Anemia   Congestive heart failure-chronic-systolic-class III  Abnormal sleeping patterns question sleep apnea  He has no symptomatic palpitations despite increased atrial fibrillation.  Taking anticoagulants.;  As noted above long progressive decline in hemoglobin.  Chart review demonstrated some protein electrophoresis 2-3 years ago.  He says he got iron last year.  I do not see iron testing or iron infusion in his MAR.  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  The importance of social distancing was discussed today.  Follow-up:  7485m Next remote: As Scheduled   Current medicines are reviewed at length with the patient today.   The patient does not have concerns regarding his medicines.  The following changes were made today:  none  Labs/ tests ordered today include: Please spoke with Amy Clegg the heart failure clinic.  They will undertake an evaluation of his anemia based on the graph above.  In addition, we need amiodarone surveillance laboratories.    chest CT scan to look for evidence of amiodarone lung toxicity given his progressive oxygen needs.  This can probably be here to-tier 3   No orders of the defined types  were placed in this encounter.   Future tests ( post COVID )  As above   Patient Risk:  after full review of this patients clinical status, I feel that they are at moderate risk at this time.  Today,  I have spent 17  minutes with the patient with telehealth technology discussing the above.  Signed, Sherryl Manges, MD  04/26/2019 3:52 PM     [facility] [address] [place] [address] [phone] (office) [phone] (fax)

## 2019-04-26 NOTE — Telephone Encounter (Signed)
Pt requiring lab appt.  LM on VM to call office

## 2019-04-26 NOTE — Telephone Encounter (Signed)
-----   Message from Sherald Hess, NP sent at 04/26/2019  4:13 PM EDT ----- Regarding: labs   Please set  up for   Labs early next week.   CBC, anemia panel --> for anemia TSH LFTs--> Vt   Thks Amy

## 2019-04-26 NOTE — Telephone Encounter (Signed)
error 

## 2019-04-26 NOTE — Telephone Encounter (Signed)
Contacted [name]. He agrees to a phone visit today.   YOUR CARDIOLOGY TEAM HAS ARRANGED FOR AN E-VISIT FOR YOUR APPOINTMENT - PLEASE REVIEW IMPORTANT INFORMATION BELOW SEVERAL DAYS PRIOR TO YOUR APPOINTMENT  Due to the recent COVID-19 pandemic, we are transitioning in-person office visits to tele-medicine visits in an effort to decrease unnecessary exposure to our patients, their families, and staff. These visits are billed to your insurance just like a normal visit is. We also encourage you to sign up for MyChart if you have not already done so. You will need a smartphone if possible. For patients that do not have this, we can still complete the visit using a regular telephone but do prefer a smartphone to enable video when possible. You may have a family member that lives with you that can help. If possible, we also ask that you have a blood pressure cuff and scale at home to measure your blood pressure, heart rate and weight prior to your scheduled appointment. Patients with clinical needs that need an in-person evaluation and testing will still be able to come to the office if absolutely necessary. If you have any questions, feel free to call our office.   CONSENT FOR TELE-HEALTH VISIT   I hereby voluntarily request, consent and authorize [facility] and its employed or contracted physicians, physician assistants, nurse practitioners or other licensed health care professionals (the Practitioner), to provide me with telemedicine health care services (the "Services") as deemed necessary by the treating Practitioner. I acknowledge and consent to receive the Services by the Practitioner via telemedicine. I understand that the telemedicine visit will involve communicating with the Practitioner through live audiovisual communication technology and the disclosure of certain medical information by electronic transmission. I acknowledge that I have been given the opportunity to request an in-person  assessment or other available alternative prior to the telemedicine visit and am voluntarily participating in the telemedicine visit.  I understand that I have the right to withhold or withdraw my consent to the use of telemedicine in the course of my care at any time, without affecting my right to future care or treatment, and that the Practitioner or I may terminate the telemedicine visit at any time. I understand that I have the right to inspect all information obtained and/or recorded in the course of the telemedicine visit and may receive copies of available information for a reasonable fee.  I understand that some of the potential risks of receiving the Services via telemedicine include:  Marland Kitchen Delay or interruption in medical evaluation due to technological equipment failure or disruption; . Information transmitted may not be sufficient (e.g. poor resolution of images) to allow for appropriate medical decision making by the Practitioner; and/or  . In rare instances, security protocols could fail, causing a breach of personal health information.  Furthermore, I acknowledge that it is my responsibility to provide information about my medical history, conditions and care that is complete and accurate to the best of my ability. I acknowledge that Practitioner's advice, recommendations, and/or decision may be based on factors not within their control, such as incomplete or inaccurate data provided by me or distortions of diagnostic images or specimens that may result from electronic transmissions. I understand that the practice of medicine is not an exact science and that Practitioner makes no warranties or guarantees regarding treatment outcomes. I acknowledge that I will receive a copy of this consent concurrently upon execution via email to the email address I last provided but may also  request a printed copy by calling the office of [facility].    I understand that my insurance will be billed for this  visit.   I have read or had this consent read to me. . I understand the contents of this consent, which adequately explains the benefits and risks of the Services being provided via telemedicine.  . I have been provided ample opportunity to ask questions regarding this consent and the Services and have had my questions answered to my satisfaction. . I give my informed consent for the services to be provided through the use of telemedicine in my medical care  By participating in this telemedicine visit I agree to the above.

## 2019-04-26 NOTE — Telephone Encounter (Signed)
Pt has been scheduled for labs 

## 2019-04-30 ENCOUNTER — Other Ambulatory Visit: Payer: Self-pay

## 2019-04-30 ENCOUNTER — Telehealth: Payer: Self-pay | Admitting: Internal Medicine

## 2019-04-30 DIAGNOSIS — J849 Interstitial pulmonary disease, unspecified: Secondary | ICD-10-CM

## 2019-04-30 NOTE — Telephone Encounter (Signed)
Follow Up:    Pt said he just finished talking to you. He says he needs to speak to you again please.N

## 2019-04-30 NOTE — Telephone Encounter (Signed)
Pt has decided he would like to wait to have his CT scan performed. I have notified CT and will place a reminder to schedule at a later date, possible post Covid per pt's request.

## 2019-04-30 NOTE — Addendum Note (Signed)
Addended by: Oretha Milch on: 04/30/2019 11:22 AM   Modules accepted: Orders

## 2019-04-30 NOTE — Progress Notes (Signed)
Chest CT ordered per Dr Odessa Fleming telemedicine visit.

## 2019-05-01 ENCOUNTER — Other Ambulatory Visit: Payer: Self-pay

## 2019-05-01 ENCOUNTER — Other Ambulatory Visit: Payer: Medicare Other

## 2019-05-01 DIAGNOSIS — I5042 Chronic combined systolic (congestive) and diastolic (congestive) heart failure: Secondary | ICD-10-CM

## 2019-05-01 DIAGNOSIS — D649 Anemia, unspecified: Secondary | ICD-10-CM

## 2019-05-01 DIAGNOSIS — Z95810 Presence of automatic (implantable) cardiac defibrillator: Secondary | ICD-10-CM

## 2019-05-01 DIAGNOSIS — R0902 Hypoxemia: Secondary | ICD-10-CM

## 2019-05-01 DIAGNOSIS — I480 Paroxysmal atrial fibrillation: Secondary | ICD-10-CM

## 2019-05-02 LAB — BASIC METABOLIC PANEL
BUN/Creatinine Ratio: 10 (ref 10–24)
BUN: 18 mg/dL (ref 8–27)
CO2: 23 mmol/L (ref 20–29)
Calcium: 9.3 mg/dL (ref 8.6–10.2)
Chloride: 98 mmol/L (ref 96–106)
Creatinine, Ser: 1.88 mg/dL — ABNORMAL HIGH (ref 0.76–1.27)
GFR calc Af Amer: 41 mL/min/{1.73_m2} — ABNORMAL LOW (ref >59)
GFR calc non Af Amer: 35 mL/min/{1.73_m2} — ABNORMAL LOW (ref >59)
Glucose: 203 mg/dL — ABNORMAL HIGH (ref 65–99)
Potassium: 4.3 mmol/L (ref 3.5–5.2)
Sodium: 140 mmol/L (ref 134–144)

## 2019-05-02 LAB — CBC WITH DIFFERENTIAL/PLATELET
Basophils Absolute: 0 10*3/uL (ref 0.0–0.2)
Basos: 0 %
EOS (ABSOLUTE): 0.1 10*3/uL (ref 0.0–0.4)
Eos: 2 %
Hematocrit: 37.2 % — ABNORMAL LOW (ref 37.5–51.0)
Hemoglobin: 11.2 g/dL — ABNORMAL LOW (ref 13.0–17.7)
Immature Grans (Abs): 0.1 10*3/uL (ref 0.0–0.1)
Immature Granulocytes: 1 %
Lymphocytes Absolute: 2.9 10*3/uL (ref 0.7–3.1)
Lymphs: 32 %
MCH: 23.4 pg — ABNORMAL LOW (ref 26.6–33.0)
MCHC: 30.1 g/dL — ABNORMAL LOW (ref 31.5–35.7)
MCV: 78 fL — ABNORMAL LOW (ref 79–97)
Monocytes Absolute: 0.7 10*3/uL (ref 0.1–0.9)
Monocytes: 7 %
Neutrophils Absolute: 5.3 10*3/uL (ref 1.4–7.0)
Neutrophils: 58 %
Platelets: 251 10*3/uL (ref 150–450)
RBC: 4.79 x10E6/uL (ref 4.14–5.80)
RDW: 18.2 % — ABNORMAL HIGH (ref 11.6–15.4)
WBC: 9.1 10*3/uL (ref 3.4–10.8)

## 2019-05-02 LAB — HEPATIC FUNCTION PANEL
ALT: 12 IU/L (ref 0–44)
AST: 16 IU/L (ref 0–40)
Albumin: 4 g/dL (ref 3.7–4.7)
Alkaline Phosphatase: 55 IU/L (ref 39–117)
Bilirubin Total: 0.4 mg/dL (ref 0.0–1.2)
Bilirubin, Direct: 0.14 mg/dL (ref 0.00–0.40)
Total Protein: 7 g/dL (ref 6.0–8.5)

## 2019-05-02 LAB — IRON AND TIBC
Iron Saturation: 11 % — ABNORMAL LOW (ref 15–55)
Iron: 38 ug/dL (ref 38–169)
Total Iron Binding Capacity: 335 ug/dL (ref 250–450)
UIBC: 297 ug/dL (ref 111–343)

## 2019-05-02 LAB — FERRITIN: Ferritin: 13 ng/mL — ABNORMAL LOW (ref 30–400)

## 2019-05-02 LAB — TSH: TSH: 6.86 u[IU]/mL — ABNORMAL HIGH (ref 0.450–4.500)

## 2019-05-08 ENCOUNTER — Ambulatory Visit (INDEPENDENT_AMBULATORY_CARE_PROVIDER_SITE_OTHER): Payer: Medicare Other

## 2019-05-08 ENCOUNTER — Telehealth: Payer: Self-pay

## 2019-05-08 ENCOUNTER — Other Ambulatory Visit (HOSPITAL_COMMUNITY): Payer: Medicare Other

## 2019-05-08 ENCOUNTER — Other Ambulatory Visit: Payer: Self-pay

## 2019-05-08 DIAGNOSIS — Z95810 Presence of automatic (implantable) cardiac defibrillator: Secondary | ICD-10-CM

## 2019-05-08 DIAGNOSIS — I5042 Chronic combined systolic (congestive) and diastolic (congestive) heart failure: Secondary | ICD-10-CM | POA: Diagnosis not present

## 2019-05-08 NOTE — Telephone Encounter (Signed)
Left message for patient to remind of missed remote transmission.  

## 2019-05-09 ENCOUNTER — Other Ambulatory Visit: Payer: Self-pay | Admitting: Medical

## 2019-05-09 ENCOUNTER — Ambulatory Visit (INDEPENDENT_AMBULATORY_CARE_PROVIDER_SITE_OTHER): Payer: Medicare Other | Admitting: *Deleted

## 2019-05-09 DIAGNOSIS — I472 Ventricular tachycardia, unspecified: Secondary | ICD-10-CM

## 2019-05-09 LAB — CUP PACEART REMOTE DEVICE CHECK
Battery Remaining Longevity: 44 mo
Battery Voltage: 2.96 V
Brady Statistic AP VP Percent: 0.13 %
Brady Statistic AP VS Percent: 99.41 %
Brady Statistic AS VP Percent: 0 %
Brady Statistic AS VS Percent: 0.46 %
Brady Statistic RA Percent Paced: 98.16 %
Brady Statistic RV Percent Paced: 0.17 %
Date Time Interrogation Session: 20200520124344
HighPow Impedance: 43 Ohm
HighPow Impedance: 53 Ohm
Implantable Lead Implant Date: 20160520
Implantable Lead Implant Date: 20160520
Implantable Lead Location: 753859
Implantable Lead Location: 753860
Implantable Lead Model: 5076
Implantable Pulse Generator Implant Date: 20160520
Lead Channel Impedance Value: 361 Ohm
Lead Channel Impedance Value: 399 Ohm
Lead Channel Impedance Value: 418 Ohm
Lead Channel Pacing Threshold Amplitude: 0.625 V
Lead Channel Pacing Threshold Amplitude: 1.125 V
Lead Channel Pacing Threshold Pulse Width: 0.4 ms
Lead Channel Pacing Threshold Pulse Width: 0.4 ms
Lead Channel Sensing Intrinsic Amplitude: 12.125 mV
Lead Channel Sensing Intrinsic Amplitude: 12.125 mV
Lead Channel Sensing Intrinsic Amplitude: 4.5 mV
Lead Channel Sensing Intrinsic Amplitude: 4.5 mV
Lead Channel Setting Pacing Amplitude: 1.5 V
Lead Channel Setting Pacing Amplitude: 2.25 V
Lead Channel Setting Pacing Pulse Width: 0.4 ms
Lead Channel Setting Sensing Sensitivity: 0.3 mV

## 2019-05-09 NOTE — Progress Notes (Signed)
EPIC Encounter for ICM Monitoring  Patient Name: [name] is a 73 y.o. male Date: 05/09/2019 Primary Care Physican: Devra Dopp, MD Primary Cardiologist: Croitoru/Bensimhon Electrophysiologist: Graciela Husbands Last [id number] at 11/24/18 office visit   Heart failure questions reviewed. Pt asymptomatic.   OptiVol Thoracic impedance abnormal suggesting fluid accumulation since 04/11/2019 with exception of 2 days at baseline.   Prescribed:Torsemide20 mgTake2tablets (40 mg total) by mouth 2 (two) times daily. Potassium 20 mEq take 1 tablet twice a day.  Labs: 05/01/2019 Creatinine 1.88, BUN 18, Potassium 4.3, Sodium 140, GFR 35-41 A complete set of results can be found in Results Review.  Recommendations:Patient has been taking varying dosages of Torsemide. He is concerned about his kidneys since labs showed function is worse. He will discuss Torsemide dosage with Dr Graciela Husbands at 5/22 visit.      Follow-up plan: ICM clinic phone appointment on 05/15/2019 (manual send) to recheck fluid levels.  Virtual visit with Dr Graciela Husbands 05/11/2019 and 05/22/2019 with Dr Royann Shivers.  Copy of ICM check sent to Dr.Klein and Dr Gala Romney.  3 month ICM trend: 05/09/2019    1 Year ICM trend:       Karie Soda, RN 05/09/2019 8:41 AM

## 2019-05-10 ENCOUNTER — Other Ambulatory Visit: Payer: Self-pay

## 2019-05-10 NOTE — Telephone Encounter (Signed)
Please advise if patient is suppose to be on Entresto 49-51mg  or 24-26mg .

## 2019-05-11 ENCOUNTER — Telehealth (INDEPENDENT_AMBULATORY_CARE_PROVIDER_SITE_OTHER): Payer: Medicare Other | Admitting: Internal Medicine

## 2019-05-11 ENCOUNTER — Other Ambulatory Visit: Payer: Self-pay

## 2019-05-11 ENCOUNTER — Encounter: Payer: Self-pay | Admitting: Internal Medicine

## 2019-05-11 VITALS — BP 126/80 | HR 79 | Ht >= 80 in | Wt >= 6400 oz

## 2019-05-11 DIAGNOSIS — I5042 Chronic combined systolic (congestive) and diastolic (congestive) heart failure: Secondary | ICD-10-CM

## 2019-05-11 DIAGNOSIS — E785 Hyperlipidemia, unspecified: Secondary | ICD-10-CM

## 2019-05-11 DIAGNOSIS — Z95810 Presence of automatic (implantable) cardiac defibrillator: Secondary | ICD-10-CM

## 2019-05-11 DIAGNOSIS — I480 Paroxysmal atrial fibrillation: Secondary | ICD-10-CM

## 2019-05-11 DIAGNOSIS — F419 Anxiety disorder, unspecified: Secondary | ICD-10-CM

## 2019-05-11 DIAGNOSIS — R001 Bradycardia, unspecified: Secondary | ICD-10-CM

## 2019-05-11 MED ORDER — BUPROPION HCL ER (XL) 150 MG PO TB24: 150.0000 mg | ORAL_TABLET | Freq: Every day | ORAL | 0 refills | Status: DC

## 2019-05-11 NOTE — Patient Instructions (Signed)
Medication Instructions:  Your physician has recommended you make the following change in your medication:   Begin Wellbutrin XL, 150mg  capsule, once daily. Please follow up with your PCP for refills.   Labwork: None ordered.   Testing/Procedures: None ordered.   Follow-Up: Your physician recommends that you schedule a follow-up appointment in: 12 mo with Dr Graciela Husbands  Any Other Special Instructions Will Be Listed Below (If Applicable).     If you need a refill on your cardiac medications before your next appointment, please call your pharmacy.

## 2019-05-11 NOTE — Progress Notes (Signed)
Thanks, Brett Canales. I will follow up MCr

## 2019-05-11 NOTE — Progress Notes (Signed)
Electrophysiology TeleHealth Note   Due to national recommendations of social distancing due to COVID 19, an audio/video telehealth visit is felt to be most appropriate for this patient at this time.  See MyChart message from today for the patient's consent to telehealth for [place].   Date:  05/11/2019   ID:  [name], DOB [birth year], MRN [id number]  Location: patient's home  Provider location: [address], [place]  Evaluation Performed: Follow-up visit  PCP:  Devra Dopp, MD  Cardiologist:   MCr  Electrophysiologist:  SK   Chief Complaint: heart failure   History of Present Illness:    [name] is a 73 y.o. male who presents via audio/video conferencing for a telehealth visit today.  Since last being seen in our clinic, the patient reports being on edge--anxious almost agoraphobic in the context of COVID-19  A few years ago he had been tried on sertraline for anxiety.  It was complicated by vivid and distressing dreams and was discontinued.    He has atrial fibrillation on Eliquis and ischemic heart disease with prior bypass and stenting.  Morbidly obese  Interval testing showed marked improvement in hemoglobin 8.6-at 11.2 and mild hypothyroidism with a TSH of 6.86   The patient denies symptoms of fevers, chills, cough, or new SOB worrisome for COVID 19.    Past Medical History:  Diagnosis Date  . CAD (coronary artery disease)    a. CABG 1995 and multiple stents. b. Known graft occlusions - NSTEMI 06/2016 s/p DES to distal SVG-diag.  . Chronic systolic CHF (congestive heart failure) (HCC)   . CKD (chronic kidney disease), stage III (HCC)   . Dietary noncompliance   . DM (diabetes mellitus) (HCC)   . DM type 2 causing CKD stage 3 (HCC)   . Dyslipidemia - low HDL and high triglycerides   . Gout   . Ischemic cardiomyopathy    a. LVEF 10-15% by cath 06/2016.  Marland Kitchen Leukocytosis   . Morbid obesity (HCC)   . Myocardial infarction  (HCC)   . NSTEMI (non-ST elevated myocardial infarction) (HCC) 06/26/2016  . PAF (paroxysmal atrial fibrillation) (HCC)   . PONV (postoperative nausea and vomiting)   . Systemic hypertension   . Unstable angina (HCC) 07/21/2018    Past Surgical History:  Procedure Laterality Date  . CARDIAC CATHETERIZATION N/A 06/28/2016   Procedure: Left Heart Cath and Cors/Grafts Angiography;  Surgeon: Lennette Bihari, MD;  Location: [facility] INVASIVE CV LAB;  Service: Cardiovascular;  Laterality: N/A;  . CARDIAC CATHETERIZATION N/A 06/29/2016   Procedure: Coronary Stent Intervention;  Surgeon: Lennette Bihari, MD;  Location: [facility] INVASIVE CV LAB;  Service: Cardiovascular;  Laterality: N/A;  . CORONARY ANGIOPLASTY WITH STENT PLACEMENT  01/12/2008   multivessel CAD occluded vein grafts to multiple sites including RCA,obtuse marginal branch & CX.  Successful PTCA and stenting distal graft.  . CORONARY ARTERY BYPASS GRAFT  1995  . CORONARY BALLOON ANGIOPLASTY N/A 03/16/2017   Procedure: Coronary Balloon Angioplasty;  Surgeon: Corky Crafts, MD;  Location: [facility] INVASIVE CV LAB;  Service: Cardiovascular;  Laterality: N/A;  . CORONARY STENT INTERVENTION N/A 07/25/2018   Procedure: CORONARY STENT INTERVENTION;  Surgeon: Corky Crafts, MD;  Location: [facility] INVASIVE CV LAB;  Service: Cardiovascular;  Laterality: N/A;  . EP IMPLANTABLE DEVICE N/A 05/09/2015   Procedure: Icd Implant;  Surgeon: Thurmon Fair, MD;  Location: [facility] INVASIVE CV LAB;  Service: Cardiovascular;  Laterality: N/A;  . LEFT  HEART CATH AND CORS/GRAFTS ANGIOGRAPHY N/A 03/14/2017   Procedure: Left Heart Cath and Cors/Grafts Angiography;  Surgeon: Corky CraftsJayadeep S Varanasi, MD;  [facility];  Service: Cardiovascular;  Laterality: N/A;  . LEFT HEART CATH AND CORS/GRAFTS ANGIOGRAPHY N/A 07/24/2018   Procedure: LEFT HEART CATH AND CORS/GRAFTS ANGIOGRAPHY;  Surgeon: Runell GessBerry, Jonathan J, MD;  Location: [facility] INVASIVE CV LAB;  Service: Cardiovascular;  Laterality:  N/A;  . LEFT HEART CATHETERIZATION WITH CORONARY ANGIOGRAM N/A 01/29/2015   Procedure: LEFT HEART CATHETERIZATION WITH CORONARY ANGIOGRAM;  Surgeon: Lennette Biharihomas A Kelly, MD;  [facility];  Service: Cardiovascular;  Laterality: N/A;  . THORACIC AORTOGRAM N/A 03/16/2017   Procedure: Thoracic Aortogram;  Surgeon: Corky CraftsJayadeep S Varanasi, MD;  [facility];  Service: Cardiovascular;  Laterality: N/A;  . TONSILLECTOMY      Current Outpatient Medications  Medication Sig Dispense Refill  . acetaminophen (TYLENOL) 325 MG tablet Take 1-2 tablets (325-650 mg total) by mouth every 4 (four) hours as needed for mild pain.    Marland Kitchen. allopurinol (ZYLOPRIM) 100 MG tablet Take 100 mg by mouth daily.    Marland Kitchen. amiodarone (PACERONE) 200 MG tablet Take 1 tablet (200 mg total) by mouth daily. 90 tablet 3  . atorvastatin (LIPITOR) 80 MG tablet Take 1 tablet (80 mg total) by mouth daily. 90 tablet 3  . cholecalciferol (VITAMIN D) 1000 UNITS tablet Take 1,000 Units by mouth every other day.     . clopidogrel (PLAVIX) 75 MG tablet Take 1 tablet (75 mg total) by mouth daily. 90 tablet 3  . ELIQUIS 5 MG TABS tablet TAKE 1 TABLET BY MOUTH TWICE A DAY 60 tablet 10  . ENTRESTO 24-26 MG TAKE 1 TABLET BY MOUTH TWICE DAILY 60 tablet 6  . Hyaluronan (ORTHOVISC) 30 MG/2ML SOSY Inject 30 mg into the articular space daily. Every six months. Last dose was in August 2019    . insulin glargine (LANTUS) 100 UNIT/ML injection Inject 0.5 mLs (50 Units total) into the skin every morning. 10 mL 11  . insulin lispro (HUMALOG KWIKPEN) 100 UNIT/ML KiwkPen Inject 20 Units into the skin every morning.     . INVOKANA 100 MG TABS Take 100 mg by mouth daily.    . isosorbide mononitrate (IMDUR) 30 MG 24 hr tablet TAKE 1 TABLET(30 MG) BY MOUTH DAILY 30 tablet 6  . metoprolol succinate (TOPROL-XL) 50 MG 24 hr tablet TAKE 1 TABLET(50 MG) BY MOUTH DAILY 90 tablet 3  . nitroGLYCERIN (NITROSTAT) 0.4 MG SL tablet PLACE 1 TABLET UNDER THE TONGUE AS  NEEDED EVERY 5 MINUTES FOR CHEST PAIN 25 tablet 6  . Omega-3 Fatty Acids (FISH OIL) 1200 MG CAPS Take 1,200 mg by mouth daily.     . potassium chloride SA (K-DUR,KLOR-CON) 20 MEQ tablet TAKE 1 TABLET(20 MEQ) BY MOUTH TWICE DAILY 180 tablet 3  . sertraline (ZOLOFT) 50 MG tablet Take 1 tablet (50 mg total) by mouth daily. 90 tablet 0  . spironolactone (ALDACTONE) 25 MG tablet Take 1 tablet (25 mg total) by mouth daily. 90 tablet 3  . torsemide (DEMADEX) 20 MG tablet Take 2 tablets (40 mg total) by mouth 2 (two) times daily. Need appt with Dr Gala RomneyBensimhon for future refills 360 tablet 0  . VICTOZA 18 MG/3ML SOPN Inject 1.2 mg into the skin daily.  0   No current facility-administered medications for this visit.     Allergies:   Coreg [carvedilol]   Social History:  The patient  reports  that he quit smoking about 2 years ago. His smoking use included cigarettes. He has never used smokeless tobacco. He reports previous alcohol use. He reports that he does not use drugs.   Family History:  The patient's   family history includes Dementia in his mother; Hearing loss in an other family member; Heart failure in his mother; Hypertension in an other family member.   ROS:  Please see the history of present illness.   All other systems are personally reviewed and negative.    Exam:    Vital Signs:  BP 126/80   Pulse 79   Ht 6\' 8"  (2.032 m)   Wt (!) 420 lb (190.5 kg)   BMI 46.14 kg/m     Well appearing, alert and conversant, regular work of breathing,  good skin color Eyes- anicteric, neuro- grossly intact, skin- no apparent rash or lesions or cyanosis, mouth- oral mucosa is pink   Labs/Other Tests and Data Reviewed:    Recent Labs: 06/08/2018: Magnesium 2.3 10/25/2018: B Natriuretic Peptide 1,051.3 05/01/2019: ALT 12; BUN 18; Creatinine, Ser 1.88; Hemoglobin 11.2; Platelets 251; Potassium 4.3; Sodium 140; TSH 6.860   Wt Readings from Last 3 Encounters:  05/11/19 (!) 420 lb (190.5 kg)  04/26/19  (!) 408 lb (185.1 kg)  11/24/18 (!) 414 lb (187.8 kg)     Other studies personally reviewed: Additional studies/ records that were reviewed today include:    ASSESSMENT & PLAN:    Ischemic cardiomyopathy  Ventricular tachycardia  Medtronic defibrillator   High Risk Medication Surveillance  Atrial fibrillation paroxysmal  PVCs  Anxiety   Congestive heart failure-chronic-systolic-class III  Abnormal sleeping patterns question sleep apnea  Hypothyroidism is mild; would anticipate trying low-dose Synthroid following equilibrium with the initiation of Wellbutrin. Side effects reviewed.   He has follow-up scheduled with Dr. Mitchell Heir   COVID 19 screen The patient denies symptoms of COVID 19 at this time.  The importance of social distancing was discussed today.  Follow-up:  33m Next remote: As Scheduled   Current medicines are reviewed at length with the patient today.   The patient does not have concerns regarding his medicines.  The following changes were made today:   Initiate Wellbutrin XL 150 every morning--he will need to get follow-up with his PCP  Labs/ tests ordered today include:   No orders of the defined types were placed in this encounter.   Future tests ( post COVID )     Patient Risk:  after full review of this patients clinical status, I feel that they are at moderate risk at this time.  Today, I have spent11 minutes with the patient with telehealth technology discussing the above.  Signed, Sherryl Manges, MD  05/11/2019 3:43 PM     [facility] [address] [place] [address] [phone] (office) [phone] (fax)

## 2019-05-15 ENCOUNTER — Ambulatory Visit (INDEPENDENT_AMBULATORY_CARE_PROVIDER_SITE_OTHER): Payer: Medicare Other

## 2019-05-15 ENCOUNTER — Telehealth: Payer: Self-pay

## 2019-05-15 DIAGNOSIS — Z95810 Presence of automatic (implantable) cardiac defibrillator: Secondary | ICD-10-CM

## 2019-05-15 DIAGNOSIS — I5042 Chronic combined systolic (congestive) and diastolic (congestive) heart failure: Secondary | ICD-10-CM

## 2019-05-15 NOTE — Telephone Encounter (Signed)
Left message for patient to remind of missed remote transmission.  

## 2019-05-18 ENCOUNTER — Encounter: Payer: Self-pay | Admitting: Cardiology

## 2019-05-18 NOTE — Progress Notes (Signed)
Remote ICD transmission.   

## 2019-05-18 NOTE — Progress Notes (Signed)
No ICM remote transmission received for 05/15/2019 and next ICM transmission scheduled for 06/11/2019.  Office visit with Dr Royann Shivers on 05/23/2019

## 2019-05-21 ENCOUNTER — Telehealth: Payer: Self-pay | Admitting: Cardiovascular Disease

## 2019-05-21 NOTE — Telephone Encounter (Signed)
Telephone visit/no my chart/call [id number]/pre reg complete/consent obtained -- ttf

## 2019-05-23 ENCOUNTER — Telehealth (INDEPENDENT_AMBULATORY_CARE_PROVIDER_SITE_OTHER): Payer: Medicare Other | Admitting: Cardiovascular Disease

## 2019-05-23 ENCOUNTER — Encounter: Payer: Self-pay | Admitting: Cardiovascular Disease

## 2019-05-23 VITALS — BP 116/80 | HR 60 | Ht 79.0 in | Wt >= 6400 oz

## 2019-05-23 DIAGNOSIS — I5042 Chronic combined systolic (congestive) and diastolic (congestive) heart failure: Secondary | ICD-10-CM

## 2019-05-23 DIAGNOSIS — I480 Paroxysmal atrial fibrillation: Secondary | ICD-10-CM

## 2019-05-23 DIAGNOSIS — I10 Essential (primary) hypertension: Secondary | ICD-10-CM

## 2019-05-23 DIAGNOSIS — N183 Chronic kidney disease, stage 3 unspecified: Secondary | ICD-10-CM

## 2019-05-23 DIAGNOSIS — Z95810 Presence of automatic (implantable) cardiac defibrillator: Secondary | ICD-10-CM | POA: Diagnosis not present

## 2019-05-23 DIAGNOSIS — E785 Hyperlipidemia, unspecified: Secondary | ICD-10-CM

## 2019-05-23 DIAGNOSIS — I472 Ventricular tachycardia, unspecified: Secondary | ICD-10-CM

## 2019-05-23 DIAGNOSIS — I25708 Atherosclerosis of coronary artery bypass graft(s), unspecified, with other forms of angina pectoris: Secondary | ICD-10-CM | POA: Diagnosis not present

## 2019-05-23 DIAGNOSIS — E1122 Type 2 diabetes mellitus with diabetic chronic kidney disease: Secondary | ICD-10-CM

## 2019-05-23 DIAGNOSIS — Z794 Long term (current) use of insulin: Secondary | ICD-10-CM

## 2019-05-23 MED ORDER — TORSEMIDE 20 MG PO TABS: ORAL_TABLET | ORAL | 0 refills | Status: AC

## 2019-05-23 NOTE — Patient Instructions (Signed)
Medication Instructions:  Change how you take torsemide: When your weight is 420 pounds and over, please take Torsemide 40 mg (2 pills) once on the morning and 40 mg once in the evening. Otherwise continue to take the Torsemide as you have with 40 mg (2 pills) in the morning and 20 mg in the evening.  If you need a refill on your cardiac medications before your next appointment, please call your pharmacy.   Lab work: Your provider would like for you to return around July 1st to have the following labs drawn: CMET, ESR and BNP. You do not need an appointment for the lab. Once in our office lobby there is a podium where you can sign in and ring the doorbell to alert [place] that you are here. The lab is open from 8:00 am to 4:30 pm; closed for lunch from 12:45pm-1:45pm.  Testing/Procedures: None ordered  Follow-Up: At [facility], you and your health needs are our priority.  As part of our continuing mission to provide you with exceptional heart care, we have created designated Provider Care Teams.  These Care Teams include your primary Cardiologist (physician) and Advanced Practice Providers (APPs -  Physician Assistants and Nurse Practitioners) who all work together to provide you with the care you need, when you need it. You will need a follow up appointment in 6 months.  Please call our office 2 months in advance to schedule this appointment.  You may see Sanda Klein, MD or one of the following Advanced Practice Providers on your designated Care Team: Almyra Deforest, PA-C Fabian Sharp, [place]

## 2019-05-23 NOTE — Progress Notes (Signed)
Virtual Visit via Telephone Note   This visit type was conducted due to national recommendations for restrictions regarding the COVID-19 Pandemic (e.g. social distancing) in an effort to limit this patient's exposure and mitigate transmission in our community.  Due to his co-morbid illnesses, this patient is at least at moderate risk for complications without adequate follow up.  This format is felt to be most appropriate for this patient at this time.  The patient did not have access to video technology/had technical difficulties with video requiring transitioning to audio format only (telephone).  All issues noted in this document were discussed and addressed.  No physical exam could be performed with this format.  Please refer to the patient's chart for his  consent to telehealth for [facility].   Date:  05/24/2019   ID:  [name], DOB 16-Apr-1946, MRN [id number]  Patient Location: Home Provider Location: Home  PCP:  Helane Rima, MD  Cardiologist:  Sanda Klein, MD  Electrophysiologist:  Jolyn Nap, MD  Evaluation Performed:  Follow-Up Visit  Chief Complaint:  CHF  History of Present Illness:    [name] is a 73 y.o. male with severe CAD and cardiomyopathy, combined systolic and diastolic HF, s/p ICD, Sustained ventricular tachycardia, PAFib, morbid obesity.  He has not had any problems with angina or new coronary events since he received a drug-eluting stent in the distal segment of the SVG to the first diagonal branch.  He continues to have intermittent problems with fluid overload and dyspnea at rest.  Unable to wean off his oxygen.  Labs performed on May 12 showed that his creatinine had increased to 1.88, what is his usual baseline around 1.4-1.6.  He reduced his dose of torsemide from 40 mg twice daily to 40 mg in the morning and 20 mg in the afternoon.  From this change his weight has increased from 408 pounds on May 7 to 420 pounds.  In October of last  year he weighed as little as 387 pounds.  He saw Dr. 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  He ordered a CT of the chest.  [name] is extremely afraid of the possibility of coronavirus infection and does not want to leave the house to have this test performed.  He is even reluctant to go and have any blood work done.  It does not seem that his breathing problems are worsening.  He is only using oxygen at 1 L, but becomes short of breath if he tries to stop it completely.  Glycemic control is good with a hemoglobin A1c of 6.8%.  Had episodes of symptomatic hypoglycemia.  His hemoglobin has increased to 11.2.  He has a borderline TSH elevation at 6.86.  Does not have overt symptoms of hypothyroidism.  He has not had syncope, palpitations or defibrillator discharges.  Denies any lower extremity edema (this is never been a prominent problem when he has fluid overload in his case).  The patient does not have symptoms concerning for COVID-19 infection (fever, chills, cough, or new shortness of breath).   [name] had an anterior STEMI on Feb 01, 2015, due to acute occlusion of the previously stented SVG-diagonal artery and had emergency PCI with a 2.5 x 38 mm Promus DES and a downstream POBA of this vessel. The only other patent conduit is the LIMA to LAD (native vessels, SVG to RCA and SVG to LCX are all chronically occluded).  He developed in-stent restenosis and March 2018 and had recurrent PCI (angioplasty without stent). This was heralded by recurrent VT with 3 consecutive defibrillator shocks and congestive heart failure exacerbation.  In August 2019 he received a drug-eluting stent in the distal SVG to diagonal artery (ORSIRO [id number]). He has at least moderately depressed LVEF. At one point LVEF was estimated at 15%, most recent echo probably overestimates his real ejection fraction.  He is morbidly obese with a BMI well over 40.  He has excellent control of diabetes mellitus with a hemoglobin A1c around 6%. It is very likely that he has obstructive sleep apnea but he declines both diagnostic sleep study and treatment with CPAP. His CHADSVASC score is 17 (age, CAD, CHF, DM, HTN).    Past Medical History:  Diagnosis Date  . CAD (coronary artery disease)    a. CABG 1995 and multiple stents. b. Known graft occlusions - NSTEMI 06/2016 s/p DES to distal SVG-diag.  . Chronic systolic CHF (congestive heart failure) ([place])   . CKD (chronic kidney disease), stage III ([place])   . Dietary noncompliance   . DM (diabetes mellitus) ([place])   . DM type 2 causing CKD stage 3 ([place])   . Dyslipidemia - low HDL and high triglycerides   . Gout   . Ischemic cardiomyopathy    a. LVEF 10-15% by cath 06/2016.  Marland Kitchen Leukocytosis   . Morbid obesity ([place])   . Myocardial infarction ([name])   . NSTEMI (non-ST elevated myocardial infarction) ([place]) 06/26/2016  . PAF (paroxysmal atrial fibrillation) ([place])   . PONV (postoperative nausea and vomiting)   . Systemic hypertension   . Unstable angina ([place]) 07/21/2018   Past Surgical History:  Procedure Laterality Date  . CARDIAC CATHETERIZATION N/A 06/28/2016   Procedure: Left Heart Cath and Cors/Grafts Angiography;  Surgeon: Troy Sine, MD;  Location: [place] [facility];  Service: Cardiovascular;  Laterality: N/A;  . CARDIAC CATHETERIZATION N/A 06/29/2016   Procedure: Coronary Stent Intervention;  Surgeon: Troy Sine, MD;  Location: [place] [facility];  Service: Cardiovascular;  Laterality: N/A;  . CORONARY ANGIOPLASTY WITH STENT PLACEMENT  01/12/2008   multivessel CAD occluded vein grafts to multiple sites including RCA,obtuse marginal branch & CX.  Successful PTCA and stenting distal graft.  . CORONARY ARTERY BYPASS GRAFT  1995  . CORONARY BALLOON ANGIOPLASTY N/A 03/16/2017   Procedure: Coronary Balloon Angioplasty;  Surgeon: Jettie Booze, MD;  Location: [place] [facility];  Service:  Cardiovascular;  Laterality: N/A;  . CORONARY STENT INTERVENTION N/A 07/25/2018   Procedure: CORONARY STENT INTERVENTION;  Surgeon: Jettie Booze, MD;  Location: [place] [facility];  Service: Cardiovascular;  Laterality: N/A;  . EP IMPLANTABLE DEVICE N/A 05/09/2015   Procedure: Icd Implant;  Surgeon: Sanda Klein, MD;  Location: [place] [facility];  Service: Cardiovascular;  Laterality: N/A;  . LEFT HEART CATH AND CORS/GRAFTS ANGIOGRAPHY N/A 03/14/2017   Procedure: Left Heart Cath and Cors/Grafts Angiography;  Surgeon: Jettie Booze, MD;  Location: [place] [facility];  Service: Cardiovascular;  Laterality: N/A;  . LEFT HEART CATH AND CORS/GRAFTS ANGIOGRAPHY N/A 07/24/2018   Procedure: LEFT HEART CATH AND CORS/GRAFTS ANGIOGRAPHY;  Surgeon: Lorretta Harp, MD;  Location: [place] [facility];  Service: Cardiovascular;  Laterality: N/A;  . LEFT HEART CATHETERIZATION WITH CORONARY ANGIOGRAM N/A 01/29/2015   Procedure: LEFT HEART CATHETERIZATION WITH CORONARY ANGIOGRAM;  Surgeon: Troy Sine, MD;  Location: [facility];  Service: Cardiovascular;  Laterality: N/A;  .  THORACIC AORTOGRAM N/A 03/16/2017   Procedure: Thoracic Aortogram;  Surgeon: Jettie Booze, MD;  Location: [place] [facility];  Service: Cardiovascular;  Laterality: N/A;  . TONSILLECTOMY       Current Meds  Medication Sig  . acetaminophen (TYLENOL) 325 MG tablet Take 1-2 tablets (325-650 mg total) by mouth every 4 (four) hours as needed for mild pain.  Marland Kitchen allopurinol (ZYLOPRIM) 100 MG tablet Take 100 mg by mouth daily.  Marland Kitchen amiodarone (PACERONE) 200 MG tablet Take 1 tablet (200 mg total) by mouth daily.  Marland Kitchen atorvastatin (LIPITOR) 80 MG tablet Take 1 tablet (80 mg total) by mouth daily.  Marland Kitchen buPROPion (WELLBUTRIN XL) 150 MG 24 hr tablet Take 1 tablet (150 mg total) by mouth daily. Please follow up with your primary care physician for additional refills.  . cholecalciferol (VITAMIN D) 1000 UNITS tablet Take 1,000 Units by  mouth every other day.   . clopidogrel (PLAVIX) 75 MG tablet Take 1 tablet (75 mg total) by mouth daily.  Marland Kitchen ELIQUIS 5 MG TABS tablet TAKE 1 TABLET BY MOUTH TWICE A DAY  . ENTRESTO 24-26 MG TAKE 1 TABLET BY MOUTH TWICE DAILY  . Hyaluronan (ORTHOVISC) 30 MG/2ML SOSY Inject 30 mg into the articular space daily. Every six months. Last dose was in August 2019  . insulin glargine (LANTUS) 100 UNIT/ML injection Inject 0.5 mLs (50 Units total) into the skin every morning.  . insulin lispro (HUMALOG KWIKPEN) 100 UNIT/ML KiwkPen Inject 20 Units into the skin every morning.   . INVOKANA 100 MG TABS Take 100 mg by mouth daily.  . isosorbide mononitrate (IMDUR) 30 MG 24 hr tablet TAKE 1 TABLET(30 MG) BY MOUTH DAILY  . metoprolol succinate (TOPROL-XL) 50 MG 24 hr tablet TAKE 1 TABLET(50 MG) BY MOUTH DAILY  . nitroGLYCERIN (NITROSTAT) 0.4 MG SL tablet PLACE 1 TABLET UNDER THE TONGUE AS NEEDED EVERY 5 MINUTES FOR CHEST PAIN  . Omega-3 Fatty Acids (FISH OIL) 1200 MG CAPS Take 1,200 mg by mouth daily.   . potassium chloride SA (K-DUR,KLOR-CON) 20 MEQ tablet TAKE 1 TABLET(20 MEQ) BY MOUTH TWICE DAILY  . sertraline (ZOLOFT) 50 MG tablet Take 1 tablet (50 mg total) by mouth daily.  Marland Kitchen spironolactone (ALDACTONE) 25 MG tablet Take 1 tablet (25 mg total) by mouth daily.  Marland Kitchen torsemide (DEMADEX) 20 MG tablet Take 40 mg in the morning and 20 mg in the evening. When your weight is 420 pounds and over, take 40 mg twice daily.  Marland Kitchen VICTOZA 18 MG/3ML SOPN Inject 1.2 mg into the skin daily.  . [DISCONTINUED] torsemide (DEMADEX) 20 MG tablet Take 2 tablets (40 mg total) by mouth 2 (two) times daily. Need appt with Dr Haroldine Laws for future refills     Allergies:   Coreg [carvedilol]   Social History   Tobacco Use  . Smoking status: Former Smoker    Types: Cigarettes    Last attempt to quit: 03/10/2017    Years since quitting: 2.2  . Smokeless tobacco: Never Used  . Tobacco comment: "1-2 cigarettes per day"  Substance Use  Topics  . Alcohol use: Not Currently    Alcohol/week: 0.0 standard drinks    Comment: rare  . Drug use: No     Family Hx: The patient's family history includes Dementia in his mother; Hearing loss in an other family member; Heart failure in his mother; Hypertension in an other family member.  ROS:   Please see the history of present illness.  All other systems reviewed and are negative.   Prior CV studies:   The following studies were reviewed today:  Echocardiogram from November 2019 reportedly showed EF 55-60% with apical akinesis.  I personally reviewed the images and believe that the interpretation is erroneous.  The images are indeed quite difficult to evaluate, even with Definity contrast.    The ejection fraction is probably around 40% or less.  There is an extensive wall motion abnormality involving the apex and the mid-distal segments of the anterior and anteroseptal wall, suggestive of an extensive LAD territory infarction.  The previous echo from arch 2018 that had estimated the ejection fraction at 10-15% was also an exaggeration.  I suspect his EF has been in the 30-40% range all along.  Also reviewed his most recent cardiac catheterization and angiography from August 2019 Labs/Other Tests and Data Reviewed:    EKG:  An ECG dated 10/25/2018 was personally reviewed today and demonstrated:  Atrial paced, ventricular sensed rhythm, old anteroseptal infarction, lateral ST-T-segment ischemic changes  Recent Labs: 06/08/2018: Magnesium 2.3 10/25/2018: B Natriuretic Peptide 1,051.3 05/01/2019: ALT 12; BUN 18; Creatinine, Ser 1.88; Hemoglobin 11.2; Platelets 251; Potassium 4.3; Sodium 140; TSH 6.860   Recent Lipid Panel Lab Results  Component Value Date/Time   CHOL 111 06/25/2016 03:33 AM   TRIG 108 06/25/2016 03:33 AM   HDL 26 (L) 06/25/2016 03:33 AM   CHOLHDL 4.3 06/25/2016 03:33 AM   LDLCALC 63 06/25/2016 03:33 AM    Wt Readings from Last 3 Encounters:  05/23/19  (!) 420 lb (190.5 kg)  05/11/19 (!) 420 lb (190.5 kg)  04/26/19 (!) 408 lb (185.1 kg)     Objective:    Vital Signs:  BP 116/80   Pulse 60   Ht '6\' 7"'  (2.007 m)   Wt (!) 420 lb (190.5 kg)   BMI 47.31 kg/m    VITAL SIGNS:  reviewed Unable to examine  ASSESSMENT & PLAN:    1. CHF: Reducing his dose of diuretic I believe he is now hypervolemic.  He does not appear to be in terrible distress.  I have advised him to increase the torsemide up to 40 mg twice daily again if he gains even 1 pound over his current weight.  I would also like to repeat his renal function tests, but he does not want to leave the house right now.  The best I could do was convince him to have the labs done in late June/early July, halfway between the labs performed in May and his CHF clinic follow-up with Dr. Haroldine Laws. 2. Amiodarone: I think it is more likely that his shortness of breath and oxygen dependency are due to heart failure rather than amiodarone toxicity, but I share Dr. Olin Pia desire to investigate this further.  Mr. Siedlecki does not want to come in for a CT of the chest.  When we check his labs, we will see if he has an elevated ESR.  If the ESR is normal it would make amiodarone lung much less likely, although an elevated ESR would be essentially nondiagnostic due to its lack of specificity.  Also need to reevaluate his thyroid function, including a free T4, as well as liver function tests. 3. CAD: Currently no angina.  Approaching 10 months from his last stent procedure. 4. VT: No recent events. 5. AFib: Low burden of arrhythmia per his last device check.  On anticoagulation with Eliquis. CHADSVasc 5 (age, HTN, CHF, DM, CAD). 6. Morbid obesity: Has always made  his physical exam very challenging to assess for hypervolemia and is a major contributing factor to his poor functional status. 7. DM: Seemly controlled.  He is receiving an SGLT2 inhibitor but also insulin. 8. CKD 3: With reduction in diuretic dose.   It will remain a challenge to maintain the balance between heart failure and renal dysfunction.  Says of diuresis has also been complicated by gout in the past. 9. HLP: On statin, LDL less than 70, chronically low HDL. 10. ICD: Device function.  See Dr. Olin Pia recent office note.   COVID-19 Education: The signs and symptoms of COVID-19 were discussed with the patient and how to seek care for testing (follow up with PCP or arrange E-visit).  The importance of social distancing was discussed today.  Time:   Today, I have spent 24 minutes with the patient with telehealth technology discussing the above problems.     Medication Adjustments/Labs and Tests Ordered: Current medicines are reviewed at length with the patient today.  Concerns regarding medicines are outlined above.   Tests Ordered: Orders Placed This Encounter  Procedures  . Comprehensive metabolic panel  . Brain natriuretic peptide  . Sed Rate (ESR)    Medication Changes: Meds ordered this encounter  Medications  . torsemide (DEMADEX) 20 MG tablet    Sig: Take 40 mg in the morning and 20 mg in the evening. When your weight is 420 pounds and over, take 40 mg twice daily.    Dispense:  360 tablet    Refill:  0   Patient Instructions  Medication Instructions:  Change how you take torsemide: When your weight is 420 pounds and over, please take Torsemide 40 mg (2 pills) once on the morning and 40 mg once in the evening. Otherwise continue to take the Torsemide as you have with 40 mg (2 pills) in the morning and 20 mg in the evening.  If you need a refill on your cardiac medications before your next appointment, please call your pharmacy.   Lab work: Your provider would like for you to return around July 1st to have the following labs drawn: CMET, ESR and BNP. You do not need an appointment for the lab. Once in our office lobby there is a podium where you can sign in and ring the doorbell to alert [place] that you are here. The  lab is open from 8:00 am to 4:30 pm; closed for lunch from 12:45pm-1:45pm.  Testing/Procedures: None ordered  Follow-Up: At [facility], you and your health needs are our priority.  As part of our continuing mission to provide you with exceptional heart care, we have created designated Provider Care Teams.  These Care Teams include your primary Cardiologist (physician) and Advanced Practice Providers (APPs -  Physician Assistants and Nurse Practitioners) who all work together to provide you with the care you need, when you need it. You will need a follow up appointment in 6 months.  Please call our office 2 months in advance to schedule this appointment.  You may see Sanda Klein, MD or one of the following Advanced Practice Providers on your designated Care Team: Almyra Deforest, PA-C Fabian Sharp, [place]        Disposition:  Follow up 6 months  Signed, Sanda Klein, MD  05/24/2019 9:54 AM    [place]

## 2019-05-24 ENCOUNTER — Encounter: Payer: Self-pay | Admitting: Cardiovascular Disease

## 2019-06-04 ENCOUNTER — Telehealth: Payer: Self-pay

## 2019-06-04 NOTE — Telephone Encounter (Signed)
Attempted to return patient call on home and cell phone and call would not go through on either line.

## 2019-06-04 NOTE — Telephone Encounter (Signed)
Attempted return call to patient as requested by voice mail message.  Call would not go through on the phone number he left, [phone].

## 2019-06-11 ENCOUNTER — Ambulatory Visit (INDEPENDENT_AMBULATORY_CARE_PROVIDER_SITE_OTHER): Payer: Medicare Other

## 2019-06-11 DIAGNOSIS — I5042 Chronic combined systolic (congestive) and diastolic (congestive) heart failure: Secondary | ICD-10-CM

## 2019-06-11 DIAGNOSIS — Z95810 Presence of automatic (implantable) cardiac defibrillator: Secondary | ICD-10-CM | POA: Diagnosis not present

## 2019-06-15 NOTE — Progress Notes (Signed)
EPIC Encounter for ICM Monitoring  Patient Name: [name] is a 73 y.o. male Date: 06/15/2019 Primary Care Physican: Helane Rima, MD Primary Cardiologist: Croitoru/Bensimhon Electrophysiologist: Caryl Comes Last [id number] at 11/24/18 office visit   Transmission reviewed.   OptiVol Thoracic impedance normal  Prescribed:Torsemide20 mgTake2tablets (40 mg total) by mouth 2 (two) times daily. Potassium 20 mEq take 1 tablet twice a day.  Labs: 05/01/2019 Creatinine 1.88, BUN 18, Potassium 4.3, Sodium 140, GFR 35-41 A complete set of results can be found in Results Review.  Recommendations:None      Follow-up plan: ICM clinic phone appointment on 07/16/2019.  Copy of ICM check sent to [place].   3 month ICM trend: 06/11/2019    1 Year ICM trend:       Rosalene Billings, RN 06/15/2019 9:13 AM

## 2019-07-16 ENCOUNTER — Ambulatory Visit (INDEPENDENT_AMBULATORY_CARE_PROVIDER_SITE_OTHER): Payer: Medicare Other

## 2019-07-16 DIAGNOSIS — I5042 Chronic combined systolic (congestive) and diastolic (congestive) heart failure: Secondary | ICD-10-CM | POA: Diagnosis not present

## 2019-07-16 DIAGNOSIS — Z95810 Presence of automatic (implantable) cardiac defibrillator: Secondary | ICD-10-CM | POA: Diagnosis not present

## 2019-07-17 ENCOUNTER — Telehealth: Payer: Self-pay | Admitting: Internal Medicine

## 2019-07-17 NOTE — Telephone Encounter (Signed)
New Message   Patient is calling in about his liquid levels and is requesting to speak with Sharman Cheek. Patient is requesting a call back to phone number [phone].

## 2019-07-17 NOTE — Telephone Encounter (Signed)
Attempted to call patient back.  Left voice mail message with name and direct phone number to return call.

## 2019-07-17 NOTE — Progress Notes (Signed)
EPIC Encounter for ICM Monitoring  Patient Name: [name] is a 73 y.o. male Date: 07/17/2019 Primary Care Physican: Helane Rima, MD Primary Cardiologist: Croitoru/Bensimhon Electrophysiologist: Caryl Comes Last [id number] at 11/24/18 office visit   Attempted call to patient and unable to reach.  Left detailed message per DPR regarding transmission. Transmission reviewed.   OptiVolThoracic impedancenormal  Prescribed:Torsemide20 mgTake 40 mg in the morning and 20 mg in the evening. When your weight is 420 pounds and over, take 40 mg twice daily.  Labs: 05/01/2019 Creatinine1.88, BUN18, Potassium4.3, Sodium140, [id number] A complete set of results can be found in Results Review.  Recommendations:Left voice mail with ICM number and encouraged to call if experiencing any fluid symptoms.  Follow-up plan: ICM clinic phone appointment on8/31/2020.  Office appointment with Dr Haroldine Laws 08/02/2019.  Copy of ICM check sent to [place].   3 month ICM trend: 07/16/2019    1 Year ICM trend:       Rosalene Billings, RN 07/17/2019 4:29 PM

## 2019-08-02 ENCOUNTER — Encounter (HOSPITAL_COMMUNITY): Payer: Medicare Other | Admitting: Internal Medicine

## 2019-08-06 ENCOUNTER — Other Ambulatory Visit: Payer: Self-pay | Admitting: Internal Medicine

## 2019-08-07 ENCOUNTER — Other Ambulatory Visit: Payer: Self-pay | Admitting: Medical

## 2019-08-08 ENCOUNTER — Ambulatory Visit (INDEPENDENT_AMBULATORY_CARE_PROVIDER_SITE_OTHER): Payer: Medicare Other | Admitting: *Deleted

## 2019-08-08 DIAGNOSIS — I255 Ischemic cardiomyopathy: Secondary | ICD-10-CM

## 2019-08-08 LAB — CUP PACEART REMOTE DEVICE CHECK
Battery Remaining Longevity: 40 mo
Battery Voltage: 2.96 V
Brady Statistic AP VP Percent: 0.07 %
Brady Statistic AP VS Percent: 87.22 %
Brady Statistic AS VP Percent: 0.01 %
Brady Statistic AS VS Percent: 12.7 %
Brady Statistic RA Percent Paced: 83.19 %
Brady Statistic RV Percent Paced: 0.09 %
Date Time Interrogation Session: 20200819113625
HighPow Impedance: 48 Ohm
HighPow Impedance: 60 Ohm
Implantable Lead Implant Date: 20160520
Implantable Lead Implant Date: 20160520
Implantable Lead Location: 753859
Implantable Lead Location: 753860
Implantable Lead Model: 5076
Implantable Pulse Generator Implant Date: 20160520
Lead Channel Impedance Value: 361 Ohm
Lead Channel Impedance Value: 418 Ohm
Lead Channel Impedance Value: 456 Ohm
Lead Channel Pacing Threshold Amplitude: 0.75 V
Lead Channel Pacing Threshold Amplitude: 1 V
Lead Channel Pacing Threshold Pulse Width: 0.4 ms
Lead Channel Pacing Threshold Pulse Width: 0.4 ms
Lead Channel Sensing Intrinsic Amplitude: 13.375 mV
Lead Channel Sensing Intrinsic Amplitude: 13.375 mV
Lead Channel Sensing Intrinsic Amplitude: 5.375 mV
Lead Channel Sensing Intrinsic Amplitude: 5.375 mV
Lead Channel Setting Pacing Amplitude: 1.5 V
Lead Channel Setting Pacing Amplitude: 2.5 V
Lead Channel Setting Pacing Pulse Width: 0.4 ms
Lead Channel Setting Sensing Sensitivity: 0.3 mV

## 2019-08-08 NOTE — Telephone Encounter (Signed)
This is Dr. Croitoru's pt 

## 2019-08-10 NOTE — Telephone Encounter (Signed)
Dr. Haroldine Laws prescribed this medication. CHF pt. Please address

## 2019-08-16 ENCOUNTER — Encounter: Payer: Self-pay | Admitting: Cardiology

## 2019-08-16 NOTE — Progress Notes (Signed)
Remote ICD transmission.   

## 2019-08-20 ENCOUNTER — Ambulatory Visit (INDEPENDENT_AMBULATORY_CARE_PROVIDER_SITE_OTHER): Payer: Medicare Other

## 2019-08-20 ENCOUNTER — Telehealth: Payer: Self-pay | Admitting: *Deleted

## 2019-08-20 DIAGNOSIS — Z95810 Presence of automatic (implantable) cardiac defibrillator: Secondary | ICD-10-CM

## 2019-08-20 DIAGNOSIS — I5042 Chronic combined systolic (congestive) and diastolic (congestive) heart failure: Secondary | ICD-10-CM | POA: Diagnosis not present

## 2019-08-20 NOTE — Telephone Encounter (Signed)
Called pt to schedule CT Chest High Res pt does not feel that he wants to come out right now due to COVID 19 pt has number [phone] and he will call when he is ready

## 2019-08-22 ENCOUNTER — Telehealth: Payer: Self-pay

## 2019-08-22 NOTE — Telephone Encounter (Signed)
Remote ICM transmission received.  Attempted call to patient regarding ICM remote transmission and left detailed message per DPR.  Advised to return call for any fluid symptoms or questions.  

## 2019-08-22 NOTE — Progress Notes (Signed)
EPIC Encounter for ICM Monitoring  Patient Name: [name] is a 73 y.o. male Date: 08/22/2019 Primary Care Physican: Helane Rima, MD Primary Cardiologist: Croitoru/Bensimhon Electrophysiologist: Caryl Comes Last [id number] at 11/24/18 office visit   Attempted call to patient and unable to reach.  Left detailed message per DPR regarding transmission. Transmission reviewed.   OptiVolThoracic impedancesuggesting possible ongoing fluid accumulation since 07/30/2019 but starting to trend toward baseline.  Fluid index > threshold starting 08/16/2019  Prescribed:Torsemide20 mgTake 40 mg in the morning and 20 mg in the evening. When your weight is 420 pounds and over, take 40 mg twice daily.    Labs: 05/01/2019 Creatinine1.88, BUN18, Potassium4.3, Sodium140, [id number] A complete set of results can be found in Results Review.  Recommendations:Left voice mail with ICM number and encouraged to call if experiencing any fluid symptoms.  Follow-up plan: ICM clinic phone appointment on 08/28/2019 to recheck fluid symptoms.   91 day device clinic remote transmission 11/07/2019.   08/02/2019 OV canceled with Dr Haroldine Laws.  Copy of ICM check sent to Dr. Caryl Comes and Dr Sallyanne Kuster.   3 month ICM trend: 08/20/2019    1 Year ICM trend:       Rosalene Billings, RN 08/22/2019 10:42 AM

## 2019-08-28 ENCOUNTER — Ambulatory Visit (INDEPENDENT_AMBULATORY_CARE_PROVIDER_SITE_OTHER): Payer: Medicare Other

## 2019-08-28 DIAGNOSIS — I5042 Chronic combined systolic (congestive) and diastolic (congestive) heart failure: Secondary | ICD-10-CM

## 2019-08-28 DIAGNOSIS — Z95810 Presence of automatic (implantable) cardiac defibrillator: Secondary | ICD-10-CM

## 2019-08-29 NOTE — Progress Notes (Signed)
EPIC Encounter for ICM Monitoring  Patient Name: [name] is a 73 y.o. male Date: 08/29/2019 Primary Care Physican: Helane Rima, MD Primary Cardiologist: Croitoru/Bensimhon Electrophysiologist: Suzzanne Cloud [phone] at 11/24/18 office visit   Spoke with patient and denies fluid symptoms.   OptiVolThoracic impedancesuggesting possible fluid accumulation for majority of days in last month but close to baseline normal at time of transmission.  Prescribed:Torsemide20 mgTake 40 mg in the morning and 20 mg in the evening. When your weight is 420 pounds and over, take 40 mg twice daily.    Labs: 05/01/2019 Creatinine1.88, BUN18, Potassium4.3, Sodium140, [id number] A complete set of results can be found in Results Review.  Recommendations: No changes and encouraged to call if experiencing any fluid symptoms.  Advised to limit salt intake  Follow-up plan: ICM clinic phone appointment on 10/08/2019.   91 day device clinic remote transmission 11/07/2019.     Copy of ICM check sent to Dr. Caryl Comes.   3 month ICM trend: 08/28/2019    1 Year ICM trend:       Rosalene Billings, RN 08/29/2019 12:25 PM

## 2019-09-03 ENCOUNTER — Telehealth: Payer: Self-pay | Admitting: Internal Medicine

## 2019-09-03 NOTE — Telephone Encounter (Signed)
Returned call to patient.  He said he has a cold and wanted to know if there was something that I could do.  Advised if it is not heart related he will need to call his PCP.  He said he has slight congestion but all VS are within normal limits. He is not running a fever.  He said he will call his PCP when we hang up.

## 2019-09-03 NOTE — Telephone Encounter (Signed)
  Patient is calling because he is sick (congested) and wants to speak to [employer].

## 2019-09-18 ENCOUNTER — Encounter: Payer: Self-pay | Admitting: *Deleted

## 2019-10-06 ENCOUNTER — Other Ambulatory Visit: Payer: Self-pay | Admitting: Cardiovascular Disease

## 2019-10-09 ENCOUNTER — Telehealth: Payer: Self-pay

## 2019-10-09 NOTE — Telephone Encounter (Signed)
Left message for patient to remind of missed remote transmission.  

## 2019-10-12 NOTE — Progress Notes (Signed)
No ICM remote transmission received for 10/08/2019 and next ICM transmission scheduled for 10/22/2019.

## 2019-10-23 ENCOUNTER — Telehealth: Payer: Self-pay

## 2019-10-23 NOTE — Telephone Encounter (Signed)
Unable to leave a message for patient to remind of missed remote transmission.  

## 2019-10-26 NOTE — Progress Notes (Signed)
No ICM remote transmission received for 10/22/2019 and next ICM transmission scheduled for 11/08/2019.

## 2019-11-01 ENCOUNTER — Ambulatory Visit
Admission: RE | Admit: 2019-11-01 | Discharge: 2019-11-01 | Disposition: A | Discharge status: Home / Self Care | Payer: Medicare Other | Source: Ambulatory Visit | Attending: Internal Medicine | Admitting: Internal Medicine

## 2019-11-01 ENCOUNTER — Other Ambulatory Visit: Payer: Self-pay

## 2019-11-01 DIAGNOSIS — J849 Interstitial pulmonary disease, unspecified: Secondary | ICD-10-CM

## 2019-11-03 ENCOUNTER — Other Ambulatory Visit: Payer: Self-pay | Admitting: Cardiovascular Disease

## 2019-11-05 ENCOUNTER — Other Ambulatory Visit: Payer: Self-pay

## 2019-11-05 MED ORDER — APIXABAN 5 MG PO TABS: 5.0000 mg | ORAL_TABLET | Freq: Two times a day (BID) | ORAL | 10 refills | Status: DC

## 2019-11-07 ENCOUNTER — Encounter: Payer: Medicare Other | Admitting: *Deleted

## 2019-11-07 ENCOUNTER — Other Ambulatory Visit: Payer: Self-pay | Admitting: Pharmacist

## 2019-11-07 MED ORDER — APIXABAN 5 MG PO TABS: 5.0000 mg | ORAL_TABLET | Freq: Two times a day (BID) | ORAL | 0 refills | Status: DC

## 2019-11-09 ENCOUNTER — Telehealth: Payer: Self-pay

## 2019-11-09 NOTE — Telephone Encounter (Signed)
Unable to speak  with patient to remind of missed remote transmission 

## 2019-11-14 NOTE — Progress Notes (Signed)
No ICM remote transmission received for 11/08/2019 and next ICM transmission scheduled for 12/31/2019.

## 2019-12-20 ENCOUNTER — Other Ambulatory Visit: Payer: Self-pay

## 2019-12-20 ENCOUNTER — Ambulatory Visit (INDEPENDENT_AMBULATORY_CARE_PROVIDER_SITE_OTHER): Payer: Medicare Other | Admitting: *Deleted

## 2019-12-20 DIAGNOSIS — I255 Ischemic cardiomyopathy: Secondary | ICD-10-CM

## 2019-12-20 LAB — CUP PACEART REMOTE DEVICE CHECK
Battery Remaining Longevity: 35 mo
Battery Voltage: 2.92 V
Brady Statistic AP VP Percent: 0.21 %
Brady Statistic AP VS Percent: 99.37 %
Brady Statistic AS VP Percent: 0 %
Brady Statistic AS VS Percent: 0.42 %
Brady Statistic RA Percent Paced: 88.25 %
Brady Statistic RV Percent Paced: 0.23 %
Date Time Interrogation Session: 20201230224047
HighPow Impedance: 47 Ohm
HighPow Impedance: 59 Ohm
Implantable Lead Implant Date: 20160520
Implantable Lead Implant Date: 20160520
Implantable Lead Location: 753859
Implantable Lead Location: 753860
Implantable Lead Model: 5076
Implantable Pulse Generator Implant Date: 20160520
Lead Channel Impedance Value: 342 Ohm
Lead Channel Impedance Value: 399 Ohm
Lead Channel Impedance Value: 475 Ohm
Lead Channel Pacing Threshold Amplitude: 0.75 V
Lead Channel Pacing Threshold Amplitude: 1 V
Lead Channel Pacing Threshold Pulse Width: 0.4 ms
Lead Channel Pacing Threshold Pulse Width: 0.4 ms
Lead Channel Sensing Intrinsic Amplitude: 12.875 mV
Lead Channel Sensing Intrinsic Amplitude: 12.875 mV
Lead Channel Sensing Intrinsic Amplitude: 5.5 mV
Lead Channel Sensing Intrinsic Amplitude: 5.5 mV
Lead Channel Setting Pacing Amplitude: 1.5 V
Lead Channel Setting Pacing Amplitude: 2 V
Lead Channel Setting Pacing Pulse Width: 0.4 ms
Lead Channel Setting Sensing Sensitivity: 0.3 mV

## 2019-12-20 MED ORDER — AMIODARONE HCL 200 MG PO TABS: 200.0000 mg | ORAL_TABLET | Freq: Every day | ORAL | 1 refills | Status: DC

## 2019-12-20 MED ORDER — SERTRALINE HCL 50 MG PO TABS: 50.0000 mg | ORAL_TABLET | Freq: Every day | ORAL | 0 refills | Status: DC

## 2019-12-27 ENCOUNTER — Other Ambulatory Visit: Payer: Self-pay | Admitting: Cardiovascular Disease

## 2019-12-27 MED ORDER — ISOSORBIDE MONONITRATE ER 30 MG PO TB24: ORAL_TABLET | ORAL | 6 refills | Status: DC

## 2019-12-27 NOTE — Telephone Encounter (Signed)
Rx(s) sent to pharmacy electronically.  

## 2019-12-27 NOTE — Telephone Encounter (Signed)
*  STAT* If patient is at the pharmacy, call can be transferred to refill team.   1. Which medications need to be refilled? (please list name of each medication and dose if known) isosorbide mononitrate (IMDUR) 30 MG 24 hr tablet  2. Which pharmacy/location (including street and city if local pharmacy) is medication to be sent to? [facility] [phone] - [place],  - [address]  3. Do they need a 30 day or 90 day supply? 30  Patient has been out of medication for a week.

## 2019-12-31 ENCOUNTER — Ambulatory Visit (INDEPENDENT_AMBULATORY_CARE_PROVIDER_SITE_OTHER): Payer: Medicare Other

## 2019-12-31 DIAGNOSIS — Z95810 Presence of automatic (implantable) cardiac defibrillator: Secondary | ICD-10-CM | POA: Diagnosis not present

## 2019-12-31 DIAGNOSIS — I5042 Chronic combined systolic (congestive) and diastolic (congestive) heart failure: Secondary | ICD-10-CM

## 2020-01-02 NOTE — Progress Notes (Signed)
EPIC Encounter for ICM Monitoring  Patient Name: [name] is a 74 y.o. male Date: 01/02/2020 Primary Care Physican: Devra Dopp, MD Primary Cardiologist: Croitoru/Bensimhon Electrophysiologist: Graciela Husbands Last [id number] at 11/24/18 office visit   Spoke with patient and denies fluid symptoms.   OptiVolThoracic impedancenormal.  Prescribed:Torsemide20 mgTake 40 mg in the morning and 20 mg in the evening. When your weight is 420 pounds and over, take 40 mg twice daily. Potassium take 1 tablet twice a day.  Labs: 05/01/2019 Creatinine1.88, BUN18, Potassium4.3, Sodium140, [id number] A complete set of results can be found in Results Review.  Recommendations: Reinforced limiting salt intake to < 2000 mg daily.  Encouraged to call if experiencing fluid symptoms.  Follow-up plan: ICM clinic phone appointment on 02/04/2020.   91 day device clinic remote transmission 03/19/2020.  Advised to call to schedule appointments with Dr Royann Shivers and Dr Gala Romney (last visit was 11/14/2018.   Copy of ICM check sent to Dr. Graciela Husbands.   3 month ICM trend: 12/31/2019    1 Year ICM trend:       Karie Soda, RN 01/02/2020 12:02 PM

## 2020-01-09 ENCOUNTER — Ambulatory Visit (HOSPITAL_COMMUNITY): Admission: RE | Admit: 2020-01-09 | Payer: Medicare PPO | Source: Ambulatory Visit | Admitting: Internal Medicine

## 2020-01-09 ENCOUNTER — Other Ambulatory Visit: Payer: Self-pay

## 2020-01-09 ENCOUNTER — Ambulatory Visit (HOSPITAL_COMMUNITY)
Admission: RE | Admit: 2020-01-09 | Discharge: 2020-01-09 | Disposition: A | Discharge status: Home / Self Care | Payer: Medicare PPO | Source: Ambulatory Visit | Attending: Internal Medicine | Admitting: Internal Medicine

## 2020-01-09 DIAGNOSIS — I2510 Atherosclerotic heart disease of native coronary artery without angina pectoris: Secondary | ICD-10-CM

## 2020-01-09 DIAGNOSIS — R0902 Hypoxemia: Secondary | ICD-10-CM

## 2020-01-09 DIAGNOSIS — N183 Chronic kidney disease, stage 3 unspecified: Secondary | ICD-10-CM | POA: Diagnosis not present

## 2020-01-09 DIAGNOSIS — Z951 Presence of aortocoronary bypass graft: Secondary | ICD-10-CM

## 2020-01-09 DIAGNOSIS — I255 Ischemic cardiomyopathy: Secondary | ICD-10-CM

## 2020-01-09 DIAGNOSIS — I10 Essential (primary) hypertension: Secondary | ICD-10-CM

## 2020-01-09 DIAGNOSIS — Z95810 Presence of automatic (implantable) cardiac defibrillator: Secondary | ICD-10-CM

## 2020-01-09 DIAGNOSIS — I130 Hypertensive heart and chronic kidney disease with heart failure and stage 1 through stage 4 chronic kidney disease, or unspecified chronic kidney disease: Secondary | ICD-10-CM

## 2020-01-09 DIAGNOSIS — Z7902 Long term (current) use of antithrombotics/antiplatelets: Secondary | ICD-10-CM

## 2020-01-09 DIAGNOSIS — I5042 Chronic combined systolic (congestive) and diastolic (congestive) heart failure: Secondary | ICD-10-CM

## 2020-01-09 DIAGNOSIS — Z7901 Long term (current) use of anticoagulants: Secondary | ICD-10-CM

## 2020-01-09 DIAGNOSIS — I5022 Chronic systolic (congestive) heart failure: Secondary | ICD-10-CM

## 2020-01-09 DIAGNOSIS — N1831 Chronic kidney disease, stage 3a: Secondary | ICD-10-CM

## 2020-01-09 DIAGNOSIS — E1122 Type 2 diabetes mellitus with diabetic chronic kidney disease: Secondary | ICD-10-CM | POA: Diagnosis not present

## 2020-01-09 DIAGNOSIS — G4730 Sleep apnea, unspecified: Secondary | ICD-10-CM

## 2020-01-09 DIAGNOSIS — Z794 Long term (current) use of insulin: Secondary | ICD-10-CM

## 2020-01-09 DIAGNOSIS — Z955 Presence of coronary angioplasty implant and graft: Secondary | ICD-10-CM

## 2020-01-09 DIAGNOSIS — I480 Paroxysmal atrial fibrillation: Secondary | ICD-10-CM

## 2020-01-09 DIAGNOSIS — R0683 Snoring: Secondary | ICD-10-CM

## 2020-01-09 DIAGNOSIS — Z9981 Dependence on supplemental oxygen: Secondary | ICD-10-CM

## 2020-01-09 NOTE — Progress Notes (Signed)
Heart Failure TeleHealth Note  Due to national recommendations of social distancing due to COVID 19, Audio/video telehealth visit is felt to be most appropriate for this patient at this time.  See MyChart message from today for patient consent regarding telehealth for [facility].  Date:  01/09/2020   ID:  [name], DOB 1946/03/06, MRN [id number]  Location: Home  Provider location: [facility] Type of Visit: Established patient  PCP:  Devra Dopp, MD  Cardiologist:  Thurmon Fair, MD Primary HF: Deanette Tullius  Chief Complaint: Heart Failure follow-up   History of Present Illness:  [name] is a 74 y.o. male with a history of morbid obesity, chronic systolic HF due to ICM (EF 15-20%), OSA not on CPAP, VT s/p MDT ICD, PAF, DM, CAD s/p CABG 1995 and multiple PCIs (most recent 07/24/18), PTSD. He has been referred to the HF clinic by Corine Shelter PA-C for further evaluation of his HF.   Admitted 8/2-07/26/18 with unstable angina. He had LHC and received DES.   He was seen in [facility] office 09/25/18 and was feeling badly. He was feeling fatigued with SOB on exertion. He was offered admission for RHC, but declined. He was referred to AHF clinic.  He presents via Web designer for a telehealth visit today.  Says he is doing pretty well. Wearing his oxygen all the time. Getting around well but says he has only left the house 3 times since COVID started so not as active. Feels he has gained some weight. He was worried his kidneys were getting worse so cut torsemide from 40 bid to 40/20. Denies edema, orthopnea or PND.   ICD interrogation done 12/31/19 volume Fluid level normal since 11/24/19 Personally reviewed   [name] denies symptoms worrisome for COVID 19.   Past Medical History:  Diagnosis Date  . CAD (coronary artery disease)    a. CABG 1995 and multiple stents. b. Known graft occlusions - NSTEMI 06/2016 s/p DES to distal  SVG-diag.  . Chronic systolic CHF (congestive heart failure) (HCC)   . CKD (chronic kidney disease), stage III (HCC)   . Dietary noncompliance   . DM (diabetes mellitus) (HCC)   . DM type 2 causing CKD stage 3 (HCC)   . Dyslipidemia - low HDL and high triglycerides   . Gout   . Ischemic cardiomyopathy    a. LVEF 10-15% by cath 06/2016.  Marland Kitchen Leukocytosis   . Morbid obesity (HCC)   . Myocardial infarction (HCC)   . NSTEMI (non-ST elevated myocardial infarction) (HCC) 06/26/2016  . PAF (paroxysmal atrial fibrillation) (HCC)   . PONV (postoperative nausea and vomiting)   . Systemic hypertension   . Unstable angina (HCC) 07/21/2018   Past Surgical History:  Procedure Laterality Date  . CARDIAC CATHETERIZATION N/A 06/28/2016   Procedure: Left Heart Cath and Cors/Grafts Angiography;  Surgeon: Lennette Bihari, MD;  Location: [facility] INVASIVE CV LAB;  Service: Cardiovascular;  Laterality: N/A;  . CARDIAC CATHETERIZATION N/A 06/29/2016   Procedure: Coronary Stent Intervention;  Surgeon: Lennette Bihari, MD;  Location: [facility] INVASIVE CV LAB;  Service: Cardiovascular;  Laterality: N/A;  . CORONARY ANGIOPLASTY WITH STENT PLACEMENT  01/12/2008   multivessel CAD occluded vein grafts to multiple sites including RCA,obtuse marginal branch & CX.  Successful PTCA and stenting distal graft.  . CORONARY ARTERY BYPASS GRAFT  1995  . CORONARY BALLOON ANGIOPLASTY N/A 03/16/2017   Procedure: Coronary Balloon Angioplasty;  Surgeon: Donnie Coffin  Irish Lack, MD;  Location: [place] CV LAB;  Service: Cardiovascular;  Laterality: N/A;  . CORONARY STENT INTERVENTION N/A 07/25/2018   Procedure: CORONARY STENT INTERVENTION;  Surgeon: Jettie Booze, MD;  Location: [place] CV LAB;  Service: Cardiovascular;  Laterality: N/A;  . EP IMPLANTABLE DEVICE N/A 05/09/2015   Procedure: Icd Implant;  Surgeon: Sanda Klein, MD;  Location: [place] CV LAB;  Service: Cardiovascular;  Laterality: N/A;  . LEFT HEART CATH AND CORS/GRAFTS  ANGIOGRAPHY N/A 03/14/2017   Procedure: Left Heart Cath and Cors/Grafts Angiography;  Surgeon: Jettie Booze, MD;  Location: [place] CV LAB;  Service: Cardiovascular;  Laterality: N/A;  . LEFT HEART CATH AND CORS/GRAFTS ANGIOGRAPHY N/A 07/24/2018   Procedure: LEFT HEART CATH AND CORS/GRAFTS ANGIOGRAPHY;  Surgeon: Lorretta Harp, MD;  Location: [place] CV LAB;  Service: Cardiovascular;  Laterality: N/A;  . LEFT HEART CATHETERIZATION WITH CORONARY ANGIOGRAM N/A 01/29/2015   Procedure: LEFT HEART CATHETERIZATION WITH CORONARY ANGIOGRAM;  Surgeon: Troy Sine, MD;  Location: [facility] CATH LAB;  Service: Cardiovascular;  Laterality: N/A;  . THORACIC AORTOGRAM N/A 03/16/2017   Procedure: Thoracic Aortogram;  Surgeon: Jettie Booze, MD;  Location: [place] CV LAB;  Service: Cardiovascular;  Laterality: N/A;  . TONSILLECTOMY       Current Outpatient Medications  Medication Sig Dispense Refill  . acetaminophen (TYLENOL) 325 MG tablet Take 1-2 tablets (325-650 mg total) by mouth every 4 (four) hours as needed for mild pain.    Marland Kitchen allopurinol (ZYLOPRIM) 100 MG tablet Take 100 mg by mouth daily.    Marland Kitchen amiodarone (PACERONE) 200 MG tablet Take 1 tablet (200 mg total) by mouth daily. 90 tablet 1  . apixaban (ELIQUIS) 5 MG TABS tablet Take 1 tablet (5 mg total) by mouth 2 (two) times daily. 60 tablet 0  . atorvastatin (LIPITOR) 80 MG tablet Take 1 tablet (80 mg total) by mouth daily. 90 tablet 3  . buPROPion (WELLBUTRIN XL) 150 MG 24 hr tablet Take 1 tablet (150 mg total) by mouth daily. Please follow up with your primary care physician for additional refills. 90 tablet 0  . cholecalciferol (VITAMIN D) 1000 UNITS tablet Take 1,000 Units by mouth every other day.     . clopidogrel (PLAVIX) 75 MG tablet Take 1 tablet (75 mg total) by mouth daily. 90 tablet 3  . ENTRESTO 24-26 MG TAKE 1 TABLET BY MOUTH TWICE DAILY 60 tablet 6  . ENTRESTO 49-51 MG TAKE 1 TABLET BY MOUTH TWICE DAILY 180 tablet 3    . Hyaluronan (ORTHOVISC) 30 MG/2ML SOSY Inject 30 mg into the articular space daily. Every six months. Last dose was in August 2019    . insulin glargine (LANTUS) 100 UNIT/ML injection Inject 0.5 mLs (50 Units total) into the skin every morning. 10 mL 11  . insulin lispro (HUMALOG KWIKPEN) 100 UNIT/ML KiwkPen Inject 20 Units into the skin every morning.     . INVOKANA 100 MG TABS Take 100 mg by mouth daily.    . isosorbide mononitrate (IMDUR) 30 MG 24 hr tablet TAKE 1 TABLET(30 MG) BY MOUTH DAILY 30 tablet 6  . metoprolol succinate (TOPROL-XL) 50 MG 24 hr tablet TAKE 1 TABLET(50 MG) BY MOUTH DAILY 90 tablet 3  . nitroGLYCERIN (NITROSTAT) 0.4 MG SL tablet PLACE 1 TABLET UNDER THE TONGUE AS NEEDED EVERY 5 MINUTES FOR CHEST PAIN 25 tablet 6  . Omega-3 Fatty Acids (FISH OIL) 1200 MG CAPS Take 1,200 mg by mouth daily.     Marland Kitchen  potassium chloride SA (K-DUR,KLOR-CON) 20 MEQ tablet TAKE 1 TABLET(20 MEQ) BY MOUTH TWICE DAILY 180 tablet 3  . sertraline (ZOLOFT) 50 MG tablet Take 1 tablet (50 mg total) by mouth daily. 90 tablet 0  . spironolactone (ALDACTONE) 25 MG tablet TAKE 1 TABLET(25 MG) BY MOUTH DAILY 90 tablet 3  . torsemide (DEMADEX) 20 MG tablet Take 40 mg in the morning and 20 mg in the evening. When your weight is 420 pounds and over, take 40 mg twice daily. 360 tablet 0  . VICTOZA 18 MG/3ML SOPN Inject 1.2 mg into the skin daily.  0   No current facility-administered medications for this encounter.    Allergies:   Coreg [carvedilol]   Social History:  The patient  reports that he quit smoking about 2 years ago. His smoking use included cigarettes. He has never used smokeless tobacco. He reports previous alcohol use. He reports that he does not use drugs.   Family History:  The patient's family history includes Dementia in his mother; Hearing loss in an other family member; Heart failure in his mother; Hypertension in an other family member.   ROS:  Please see the history of present illness.    All other systems are personally reviewed and negative.   126/66 HR sats 100% Exam:  (Video/Tele Health Call; Exam is subjective and or/visual.) General:  Speaks in full sentences. No resp difficulty. Lungs: Normal respiratory effort with conversation.  Abdomen: Obese Non-distended per patient report Extremities: Pt denies edema. Neuro: Alert & oriented x 3.   Recent Labs: 05/01/2019: ALT 12; BUN 18; Creatinine, Ser 1.88; Hemoglobin 11.2; Platelets 251; Potassium 4.3; Sodium 140; TSH 6.860  Personally reviewed   Wt Readings from Last 3 Encounters:  05/23/19 (!) 190.5 kg (420 lb)  05/11/19 (!) 190.5 kg (420 lb)  04/26/19 (!) 185.1 kg (408 lb)      ASSESSMENT AND PLAN:  1. Chronic systolic HF due to ICM. Has MDT ICD - Echo 03/14/17: EF 15-20%, grade 3 DD, RV not visualized - Echo 11/13/18 very poor images even with Definity, EF read as 55% I think closer to 35% - Stable NYHA II-III - Volume status looks great on device interrogation.  - Continue torsemide 40/20 (was previously on 40 mg BID). Can take extra as needed.  - Continue Entresto 49/51 mg BID. - Continue Toprol XL 50 mg daily - Continue spiro 25 daily - Will order labs.  - F/u visit in 4 months with echo   2. CAD s/p CABG. Most recent PCI 07/2018 - No s/s of ischemia - On plavix and eliquis. Can stop Plavix as 1 year out from PCI  - Continue statin  3. Hx of VT - Has MDT ICD - No recent VT on interrogation   4. PAF - remains in NSR - Continue amio 200 mg daily - Continue Eliquis 5 mg BID  5. Snoring and apnea at night - Has referral to pulmonary for BiPAP  6. DM - Per PCP. On invokana, victoza, and insulin  7. CKD 3 - Due for labs. Will arrange.  - Follows with Nephrology   8. Obesity - Body mass index is 46.97 kg/m. - Stressed need for weight loss  9. Hypoxia - Continue home O2. Needs weight loss   COVID screen The patient does not have any symptoms that suggest any further testing/  screening at this time.  Social distancing reinforced today.  Recommended follow-up:  As above  Relevant cardiac medications were reviewed at  length with the patient today.   The patient does not have concerns regarding their medications at this time.   The following changes were made today:  As above  Today, I have spent 16 minutes with the patient with telehealth technology discussing the above issues .    Signed, Arvilla Meres, MD  01/09/2020 4:20 PM  [facility] Failure [facility] [address] Heart and Vascular [place] [address] [phone] (office) [phone] (fax)

## 2020-01-10 ENCOUNTER — Encounter (HOSPITAL_COMMUNITY): Payer: Self-pay | Admitting: Internal Medicine

## 2020-01-10 ENCOUNTER — Other Ambulatory Visit (HOSPITAL_COMMUNITY): Payer: Self-pay | Admitting: *Deleted

## 2020-01-10 ENCOUNTER — Telehealth (HOSPITAL_COMMUNITY): Payer: Medicare PPO | Admitting: Internal Medicine

## 2020-01-10 DIAGNOSIS — I5022 Chronic systolic (congestive) heart failure: Secondary | ICD-10-CM

## 2020-01-10 IMAGING — DX DG CHEST 1V PORT
1 series · 1 of 1 positions shown · non-contrast
Comparison: Chest radiographs 03/10/2017 and earlier.

CLINICAL DATA: 72-year-old male with chest pain onset several hours
ago. Shortness of breath and nausea.

EXAM:
PORTABLE CHEST 1 VIEW

[chest]
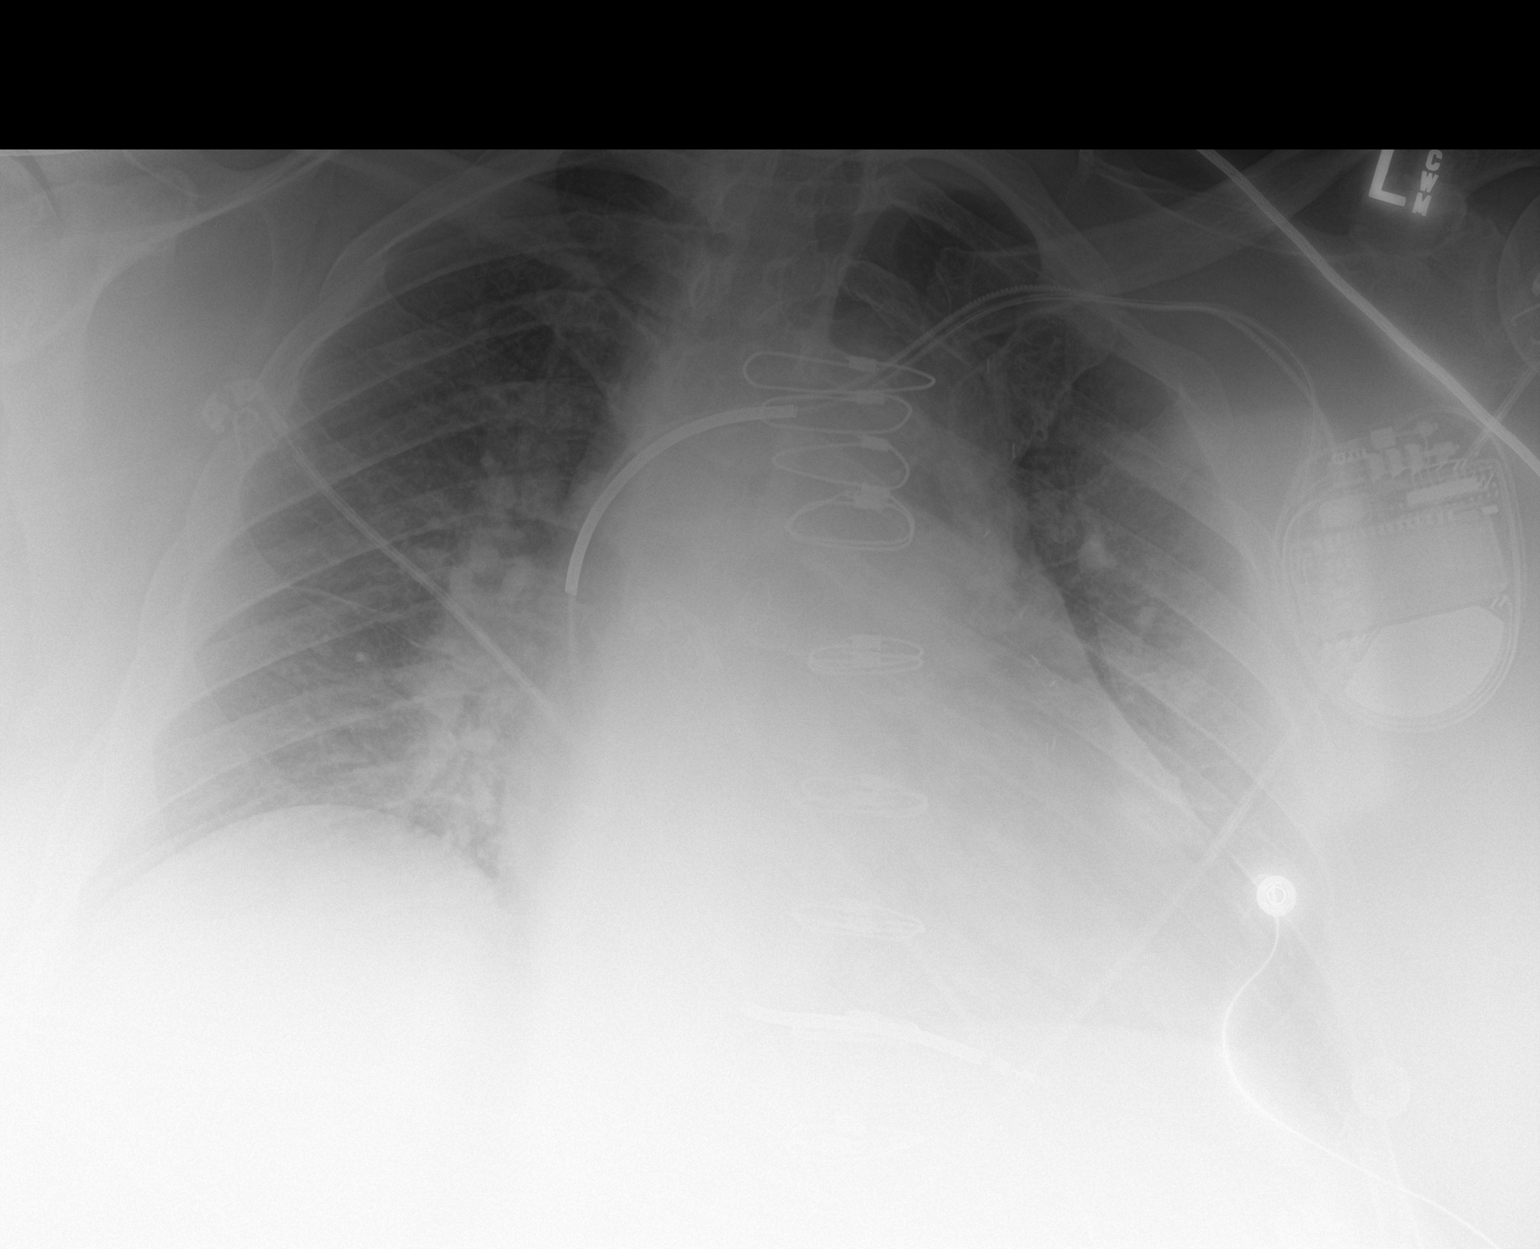

[1 of 1 positions shown; findings below may reference images not displayed]

FINDINGS: Portable AP semi upright view at 0454 hours. Prior CABG. Stable
cardiomegaly and mediastinal contours. Stable left chest cardiac
AICD. The patient is rotated to the left. Mildly lower lung volumes.
No pneumothorax, pulmonary edema, pleural effusion or consolidation.
Paucity of bowel gas in the upper abdomen. Visualized tracheal air
column is within normal limits.
IMPRESSION: No acute cardiopulmonary abnormality.

## 2020-01-10 NOTE — Addendum Note (Signed)
Encounter addended by: Modesta Messing, CMA on: 01/10/2020 8:54 AM  Actions taken: Medication List reviewed, Problem List reviewed, Allergies reviewed

## 2020-01-10 NOTE — Patient Instructions (Signed)
Stop Plavix.  Your provider has requested you have an echocardiogram and lab work at [facility] on [address] (a scheduler will contact you to schedule this appointment)   Follow up in 4 months. (please see highlighted appointment below)

## 2020-01-10 NOTE — Progress Notes (Signed)
-  lab orders placed (given to [facility] to schedule) -echo order placed (given to [facility] to schedule) -plavix removed from pt med list -5m f.u scheduled 5/25  AVS mailed   Orders Per Dr.Bensimhon 1. Please get labs at [address]. CMET, TSH, T4, T3, CBC, BNP  2. Get echo with labs  3. Stop plavix (stent greater than 72 year old). Continue Eliquis  4. F/u with me 4-6 months

## 2020-01-10 NOTE — Addendum Note (Signed)
Encounter addended by: Modesta Messing, CMA on: 01/10/2020 8:48 AM  Actions taken: Clinical Note Signed

## 2020-01-10 NOTE — Progress Notes (Signed)
-  lab orders placed (given to [place] to schedule) -echo order placed (given to [place] to schedule) -plavix removed from pt med list -4m f.u scheduled 5/25  AVS mailed   Orders Per Dr.Bensimhon 1. Please get labs at [address]. CMET, TSH, T4, T3, CBC, BNP  2. Get echo with labs  3. Stop plavix (stent greater than 72 year old). Continue Eliquis  4. F/u with me 4-6 months    

## 2020-01-15 ENCOUNTER — Telehealth: Payer: Self-pay | Admitting: Cardiovascular Disease

## 2020-01-15 NOTE — Telephone Encounter (Signed)
It owuld be very hard to image him well in his WC. We can provide support for him to get up and down from [facility].

## 2020-01-15 NOTE — Telephone Encounter (Signed)
New Message    Pt is calling and is wondering if he can be sitting in his wheel hair for his Echo  He says last time he had some PTSD and didn't do well     Please advise

## 2020-01-15 NOTE — Telephone Encounter (Signed)
Spoke with the patient and he was advised that they would not be able to get a clear image if he stayed in his wheelchair.   He stated that the last time, when he was being helped to the bed, that he fell. When he fell he had a panic attack and was sent to the hospital with an increased heart rate. He is afraid that this will happen again so is unsure if he will do the echo.

## 2020-01-15 NOTE — Telephone Encounter (Signed)
The patient stated that the last time he had an echo he fell and had to go to the hospital. He wanted to know if he could stay in his wheelchair. He has been advised that it may not be possible. He would rather not do the echo if he cannot stay in his wheelchair.   Message has been routed to the ordering provider.

## 2020-01-18 ENCOUNTER — Telehealth: Payer: Self-pay | Admitting: *Deleted

## 2020-01-18 ENCOUNTER — Telehealth (INDEPENDENT_AMBULATORY_CARE_PROVIDER_SITE_OTHER): Payer: Medicare PPO | Admitting: Cardiovascular Disease

## 2020-01-18 VITALS — BP 167/70 | HR 62 | Ht 79.0 in

## 2020-01-18 DIAGNOSIS — I25708 Atherosclerosis of coronary artery bypass graft(s), unspecified, with other forms of angina pectoris: Secondary | ICD-10-CM | POA: Diagnosis not present

## 2020-01-18 DIAGNOSIS — E7800 Pure hypercholesterolemia, unspecified: Secondary | ICD-10-CM

## 2020-01-18 DIAGNOSIS — E1169 Type 2 diabetes mellitus with other specified complication: Secondary | ICD-10-CM

## 2020-01-18 DIAGNOSIS — I472 Ventricular tachycardia, unspecified: Secondary | ICD-10-CM

## 2020-01-18 DIAGNOSIS — Z7901 Long term (current) use of anticoagulants: Secondary | ICD-10-CM

## 2020-01-18 DIAGNOSIS — I5042 Chronic combined systolic (congestive) and diastolic (congestive) heart failure: Secondary | ICD-10-CM

## 2020-01-18 DIAGNOSIS — N183 Chronic kidney disease, stage 3 unspecified: Secondary | ICD-10-CM

## 2020-01-18 DIAGNOSIS — Z5181 Encounter for therapeutic drug level monitoring: Secondary | ICD-10-CM

## 2020-01-18 DIAGNOSIS — E1122 Type 2 diabetes mellitus with diabetic chronic kidney disease: Secondary | ICD-10-CM

## 2020-01-18 DIAGNOSIS — I25810 Atherosclerosis of coronary artery bypass graft(s) without angina pectoris: Secondary | ICD-10-CM

## 2020-01-18 DIAGNOSIS — N1831 Chronic kidney disease, stage 3a: Secondary | ICD-10-CM | POA: Diagnosis not present

## 2020-01-18 DIAGNOSIS — Z794 Long term (current) use of insulin: Secondary | ICD-10-CM

## 2020-01-18 DIAGNOSIS — Z79899 Other long term (current) drug therapy: Secondary | ICD-10-CM

## 2020-01-18 DIAGNOSIS — I480 Paroxysmal atrial fibrillation: Secondary | ICD-10-CM

## 2020-01-18 DIAGNOSIS — Z95810 Presence of automatic (implantable) cardiac defibrillator: Secondary | ICD-10-CM | POA: Diagnosis not present

## 2020-01-18 DIAGNOSIS — Z951 Presence of aortocoronary bypass graft: Secondary | ICD-10-CM

## 2020-01-18 DIAGNOSIS — I10 Essential (primary) hypertension: Secondary | ICD-10-CM

## 2020-01-18 DIAGNOSIS — E669 Obesity, unspecified: Secondary | ICD-10-CM

## 2020-01-18 NOTE — Progress Notes (Signed)
Virtual Visit via Telephone Note   This visit type was conducted due to national recommendations for restrictions regarding the COVID-19 Pandemic (e.g. social distancing) in an effort to limit this patient's exposure and mitigate transmission in our community.  Due to his co-morbid illnesses, this patient is at least at moderate risk for complications without adequate follow up.  This format is felt to be most appropriate for this patient at this time.  The patient did not have access to video technology/had technical difficulties with video requiring transitioning to audio format only (telephone).  All issues noted in this document were discussed and addressed.  No physical exam could be performed with this format.  Please refer to the patient's chart for his  consent to telehealth for [facility].   Date:  01/18/2020   ID:  [name], DOB January 05, 1946, MRN [id number]  Patient Location: Home Provider Location: Home  PCP:  Helane Rima, MD  Cardiologist:  Sanda Klein, MD Matthew Bali, MD (CHF) Electrophysiologist:  Jolyn Nap, MD  Evaluation Performed:  Follow-Up Visit  Chief Complaint:  CHF  History of Present Illness:    [name] is a 74 y.o. male with severe CAD and cardiomyopathy, combined systolic and diastolic HF, s/p ICD, Sustained ventricular tachycardia, PAFib, morbid obesity.  Now on O2 2L [place], 24h a day.  He denies orthopnea or PND, but has always sleeps in a recliner.  He reports that he feels less mobile since he has been unable to get shots in his knees.  He is moving slower around the house.  Otherwise he is generally doing okay.  He is extremely reluctant to come to the office.  He had a fall in the doctors office a few months ago and still has "PTSD".  He had a bad "panic attack" and could not breathe for a long time.  He is still very nervous about it.  He is very reluctant to go outside because of the Covid pandemic.  He would like to delay his  echocardiogram.  He does not have a problem going out to get labs drawn.  He is scheduled have labs this coming Thursday.  He has not had any problems with angina or new coronary events since he received a drug-eluting stent in the distal segment of the SVG to the first diagonal branch.  He has not required any major diuretic dose adjustment or hospitalization for heart failure in a similar period of time.  He denies syncope or palpitations and has not had defibrillator discharges.  He has never had problems with lower extremity edema even when he was in heart failure exacerbation.  Device interrogation shows reassuring findings as well.  He has not had any atrial fibrillation since March 2020.  He has not had any episodes of nonsustained VT since his last device download.  His OptiVol has been very stable over the last 3 months.  He reports that his weight has been steady at around 420 pounds.  He did not weigh himself today.  He is on chronic amiodarone.  Last liver and thyroid function tests were performed in May 2020.  The patient does not have symptoms concerning for COVID-19 infection (fever, chills, cough, or new shortness of breath).   [name] had an anterior STEMI on Feb 01, 2015, due to acute occlusion of the previously stented SVG-diagonal artery and had emergency PCI with a 2.5 x 38 mm Promus DES and a downstream POBA of this vessel. The only other  patent conduit is the LIMA to LAD (native vessels, SVG to RCA and SVG to LCX are all chronically occluded).  He developed in-stent restenosis and March 2018 and had recurrent PCI (angioplasty without stent). This was heralded by recurrent VT with 3 consecutive defibrillator shocks and congestive heart failure exacerbation.  In August 2019 he received a drug-eluting stent in the distal SVG to diagonal artery ([id number] [id number]). He has at least moderately depressed LVEF. At one point LVEF was estimated at 15%, most recent echo probably overestimates his  real ejection fraction.  He is morbidly obese with a BMI well over 40. He has excellent control of diabetes mellitus with a hemoglobin A1c around 6%. It is very likely that he has obstructive sleep apnea but he declines both diagnostic sleep study and treatment with CPAP. His CHADSVASC score is 31 (age, CAD, CHF, DM, HTN).    Past Medical History:  Diagnosis Date  . CAD (coronary artery disease)    a. CABG 1995 and multiple stents. b. Known graft occlusions - NSTEMI 06/2016 s/p DES to distal SVG-diag.  . Chronic systolic CHF (congestive heart failure) (HCC)   . CKD (chronic kidney disease), stage III   . Dietary noncompliance   . DM (diabetes mellitus) (HCC)   . DM type 2 causing CKD stage 3 (HCC)   . Dyslipidemia - low HDL and high triglycerides   . Gout   . Ischemic cardiomyopathy    a. LVEF 10-15% by cath 06/2016.  Marland Kitchen Leukocytosis   . Morbid obesity (HCC)   . Myocardial infarction (HCC)   . NSTEMI (non-ST elevated myocardial infarction) (HCC) 06/26/2016  . PAF (paroxysmal atrial fibrillation) (HCC)   . PONV (postoperative nausea and vomiting)   . Systemic hypertension   . Unstable angina (HCC) 07/21/2018   Past Surgical History:  Procedure Laterality Date  . CARDIAC CATHETERIZATION N/A 06/28/2016   Procedure: Left Heart Cath and Cors/Grafts Angiography;  Surgeon: Lennette Bihari, MD;  Location: [facility] INVASIVE CV LAB;  Service: Cardiovascular;  Laterality: N/A;  . CARDIAC CATHETERIZATION N/A 06/29/2016   Procedure: Coronary Stent Intervention;  Surgeon: Lennette Bihari, MD;  Location: [facility] INVASIVE CV LAB;  Service: Cardiovascular;  Laterality: N/A;  . CORONARY ANGIOPLASTY WITH STENT PLACEMENT  01/12/2008   multivessel CAD occluded vein grafts to multiple sites including RCA,obtuse marginal branch & CX.  Successful PTCA and stenting distal graft.  . CORONARY ARTERY BYPASS GRAFT  1995  . CORONARY BALLOON ANGIOPLASTY N/A 03/16/2017   Procedure: Coronary Balloon Angioplasty;  Surgeon: Corky Crafts, MD;  Location: [facility] INVASIVE CV LAB;  Service: Cardiovascular;  Laterality: N/A;  . CORONARY STENT INTERVENTION N/A 07/25/2018   Procedure: CORONARY STENT INTERVENTION;  Surgeon: Corky Crafts, MD;  Location: [facility] INVASIVE CV LAB;  Service: Cardiovascular;  Laterality: N/A;  . EP IMPLANTABLE DEVICE N/A 05/09/2015   Procedure: Icd Implant;  Surgeon: Thurmon Fair, MD;  Location: [facility] INVASIVE CV LAB;  Service: Cardiovascular;  Laterality: N/A;  . LEFT HEART CATH AND CORS/GRAFTS ANGIOGRAPHY N/A 03/14/2017   Procedure: Left Heart Cath and Cors/Grafts Angiography;  Surgeon: Corky Crafts, MD;  Location: [facility] INVASIVE CV LAB;  Service: Cardiovascular;  Laterality: N/A;  . LEFT HEART CATH AND CORS/GRAFTS ANGIOGRAPHY N/A 07/24/2018   Procedure: LEFT HEART CATH AND CORS/GRAFTS ANGIOGRAPHY;  Surgeon: Runell Gess, MD;  Location: [facility] INVASIVE CV LAB;  Service: Cardiovascular;  Laterality: N/A;  . LEFT HEART CATHETERIZATION WITH CORONARY ANGIOGRAM N/A 01/29/2015   Procedure: LEFT HEART CATHETERIZATION  WITH CORONARY ANGIOGRAM;  Surgeon: Lennette Biharihomas A Kelly, MD;  [facility];  Service: Cardiovascular;  Laterality: N/A;  . THORACIC AORTOGRAM N/A 03/16/2017   Procedure: Thoracic Aortogram;  Surgeon: Corky CraftsJayadeep S Varanasi, MD;  [facility];  Service: Cardiovascular;  Laterality: N/A;  . TONSILLECTOMY       Current Meds  Medication Sig  . acetaminophen (TYLENOL) 325 MG tablet Take 1-2 tablets (325-650 mg total) by mouth every 4 (four) hours as needed for mild pain.  Marland Kitchen. allopurinol (ZYLOPRIM) 100 MG tablet Take 100 mg by mouth daily.  Marland Kitchen. amiodarone (PACERONE) 200 MG tablet Take 1 tablet (200 mg total) by mouth daily.  Marland Kitchen. apixaban (ELIQUIS) 5 MG TABS tablet Take 1 tablet (5 mg total) by mouth 2 (two) times daily.  Marland Kitchen. atorvastatin (LIPITOR) 80 MG tablet Take 1 tablet (80 mg total) by mouth daily.  Marland Kitchen. buPROPion (WELLBUTRIN XL) 150 MG 24 hr tablet Take 1 tablet (150 mg total) by mouth  daily. Please follow up with your primary care physician for additional refills.  . cholecalciferol (VITAMIN D) 1000 UNITS tablet Take 1,000 Units by mouth every other day.   Marland Kitchen. ENTRESTO 49-51 MG TAKE 1 TABLET BY MOUTH TWICE DAILY  . Hyaluronan (ORTHOVISC) 30 MG/2ML SOSY Inject 30 mg into the articular space daily. Every six months. Last dose was in August 2019  . insulin glargine (LANTUS) 100 UNIT/ML injection Inject 0.5 mLs (50 Units total) into the skin every morning.  . insulin lispro (HUMALOG KWIKPEN) 100 UNIT/ML KiwkPen Inject 20 Units into the skin every morning.   . INVOKANA 100 MG TABS Take 100 mg by mouth daily.  . isosorbide mononitrate (IMDUR) 30 MG 24 hr tablet TAKE 1 TABLET(30 MG) BY MOUTH DAILY  . metoprolol succinate (TOPROL-XL) 50 MG 24 hr tablet TAKE 1 TABLET(50 MG) BY MOUTH DAILY  . nitroGLYCERIN (NITROSTAT) 0.4 MG SL tablet PLACE 1 TABLET UNDER THE TONGUE AS NEEDED EVERY 5 MINUTES FOR CHEST PAIN  . Omega-3 Fatty Acids (FISH OIL) 1200 MG CAPS Take 1,200 mg by mouth daily.   . potassium chloride SA (K-DUR,KLOR-CON) 20 MEQ tablet TAKE 1 TABLET(20 MEQ) BY MOUTH TWICE DAILY  . sertraline (ZOLOFT) 50 MG tablet Take 1 tablet (50 mg total) by mouth daily.  Marland Kitchen. spironolactone (ALDACTONE) 25 MG tablet TAKE 1 TABLET(25 MG) BY MOUTH DAILY  . torsemide (DEMADEX) 20 MG tablet Take 40 mg in the morning and 20 mg in the evening. When your weight is 420 pounds and over, take 40 mg twice daily.  Marland Kitchen. VICTOZA 18 MG/3ML SOPN Inject 1.2 mg into the skin daily.  . [DISCONTINUED] ENTRESTO 24-26 MG TAKE 1 TABLET BY MOUTH TWICE DAILY     Allergies:   Coreg [carvedilol]   Social History   Tobacco Use  . Smoking status: Former Smoker    Types: Cigarettes    Quit date: 03/10/2017    Years since quitting: 2.8  . Smokeless tobacco: Never Used  . Tobacco comment: "1-2 cigarettes per day"  Substance Use Topics  . Alcohol use: Not Currently    Alcohol/week: 0.0 standard drinks    Comment: rare  . Drug  use: No     Family Hx: The patient's family history includes Dementia in his mother; Hearing loss in an other family member; Heart failure in his mother; Hypertension in an other family member.  ROS:   Please see the history of present illness.     All other systems are reviewed and  are negative.   Prior CV studies:   The following studies were reviewed today:  Echocardiogram from November 2019 reportedly showed EF 55-60% with apical akinesis.  I personally reviewed the images and believe that the interpretation is erroneous.  The images are indeed quite difficult to evaluate, even with Definity contrast.    The ejection fraction is probably around 40% or less.  There is an extensive wall motion abnormality involving the apex and the mid-distal segments of the anterior and anteroseptal wall, suggestive of an extensive LAD territory infarction.  The previous echo from arch 2018 that had estimated the ejection fraction at 10-15% was also an exaggeration.  I suspect his EF has been in the 30-40% range all along.  Also reviewed his most recent cardiac catheterization and angiography from August 2019 Labs/Other Tests and Data Reviewed:    EKG:  An ECG dated 10/25/2018 was personally reviewed today and demonstrated:  Atrial paced, ventricular sensed rhythm, old anteroseptal infarction, lateral ST-T-segment ischemic changes  Recent Labs: 05/01/2019: ALT 12; BUN 18; Creatinine, Ser 1.88; Hemoglobin 11.2; Platelets 251; Potassium 4.3; Sodium 140; TSH 6.860   Recent Lipid Panel Lab Results  Component Value Date/Time   CHOL 111 06/25/2016 03:33 AM   TRIG 108 06/25/2016 03:33 AM   HDL 26 (L) 06/25/2016 03:33 AM   CHOLHDL 4.3 06/25/2016 03:33 AM   LDLCALC 63 06/25/2016 03:33 AM    Wt Readings from Last 3 Encounters:  05/23/19 (!) 420 lb (190.5 kg)  05/11/19 (!) 420 lb (190.5 kg)  04/26/19 (!) 408 lb (185.1 kg)     Objective:    Vital Signs:  BP (!) 167/70   Pulse 62   Ht 6\' 7"   (2.007 m)   BMI 47.31 kg/m    VITAL SIGNS:  reviewed Unable to examine  ASSESSMENT & PLAN:    1. CHF: It has been a long time since he has had an in person evaluation for congestive heart failure, but he seems to be doing reasonably well.  I am not sure what his "dry weight" is anymore.  Encouraged him to continue daily weights so that we can pick up any further increases.  He is on Entresto metoprolol succinate.  Also on spironolactone and a relatively low-dose of loop diuretic.  The day, but he reports that this is very typical and since we have not seen him in person I decided not to adjust his heart failure medications any further. 2. VT: No recent events. 3. AFib: Very low burden of arrhythmia, virtually no events in the last year.  On anticoagulation with Eliquis. CHADSVasc 5 (age, HTN, CHF, DM, CAD). 4. Amiodarone: He is breathing problems have been stable for over 6 months, making it less likely that they are amiodarone related.  Heart function tests.  The amiodarone has been exceptionally successful at preventing both atrial and ventricular arrhythmia. 5. CAD: No angina.  Its been about a year and a half since his last stent procedure.  Native and graft coronary artery disease is very advanced.  He has complete occlusion of his native coronary arteries and is essentially dependent on a SVG to diagonal.bypass that has been previously stented. 6. HTN: He reports that usually at home his systolic blood pressure is 110-120. 7. Anticoagulation: No bleeding complications. 8. Morbid obesity: Has always been an important part of his poor functional status. 9. DM: No recent hemoglobin A1c.  We will have the labs next week. 10. CKD 3: Recheck labs.  No recent gout  attacks. 11. HLP: Target LDL less than 70.  On statin. 12. ICD: Normal device function.  Followed by Dr. Graciela Husbands.   COVID-19 Education: The signs and symptoms of COVID-19 were discussed with the patient and how to seek care for testing  (follow up with PCP or arrange E-visit).  The importance of social distancing was discussed today.  Time:   Today, I have spent 24 minutes with the patient with telehealth technology discussing the above problems.     Medication Adjustments/Labs and Tests Ordered: Current medicines are reviewed at length with the patient today.  Concerns regarding medicines are outlined above.   Tests Ordered: Orders Placed This Encounter  Procedures  . Comprehensive metabolic panel  . Magnesium  . T4, free  . TSH    Medication Changes: No orders of the defined types were placed in this encounter.  Patient Instructions  Medication Instructions:  No changes *If you need a refill on your cardiac medications before your next appointment, please call your pharmacy*  Lab Work: Your provider would like for you to have the following labs the next time you have your labs completed: CMET, Magnesium, TSH and free T4.  If you have labs (blood work) drawn today and your tests are completely normal, you will receive your results only by: Marland Kitchen MyChart Message (if you have MyChart) OR . A paper copy in the mail If you have any lab test that is abnormal or we need to change your treatment, we will call you to review the results.  Testing/Procedures: None ordered  Follow-Up: At [facility], you and your health needs are our priority.  As part of our continuing mission to provide you with exceptional heart care, we have created designated Provider Care Teams.  These Care Teams include your primary Cardiologist (physician) and Advanced Practice Providers (APPs -  Physician Assistants and Nurse Practitioners) who all work together to provide you with the care you need, when you need it.  Your next appointment:   6 month(s)  The format for your next appointment:   In Person  Provider:   You may see Thurmon Fair, MD       Disposition:  Follow up 6 months  Signed, Thurmon Fair, MD  01/18/2020  6:00 PM    [facility]

## 2020-01-18 NOTE — Patient Instructions (Addendum)
Medication Instructions:  No changes *If you need a refill on your cardiac medications before your next appointment, please call your pharmacy*  Lab Work: Your provider would like for you to have the following labs the next time you have your labs completed: CMET, Magnesium, TSH and free T4.  If you have labs (blood work) drawn today and your tests are completely normal, you will receive your results only by: Marland Kitchen MyChart Message (if you have MyChart) OR . A paper copy in the mail If you have any lab test that is abnormal or we need to change your treatment, we will call you to review the results.  Testing/Procedures: None ordered  Follow-Up: At [facility], you and your health needs are our priority.  As part of our continuing mission to provide you with exceptional heart care, we have created designated Provider Care Teams.  These Care Teams include your primary Cardiologist (physician) and Advanced Practice Providers (APPs -  Physician Assistants and Nurse Practitioners) who all work together to provide you with the care you need, when you need it.  Your next appointment:   6 month(s)  The format for your next appointment:   In Person  Provider:   You may see Thurmon Fair, MD

## 2020-01-18 NOTE — Telephone Encounter (Signed)
Virtual Visit Pre-Appointment Phone Call  "(Name), I am calling you today to discuss your upcoming appointment. We are currently trying to limit exposure to the virus that causes COVID-19 by seeing patients at home rather than in the office."  1. "What is the BEST phone number to call the day of the visit?" - include this in appointment notes  2. "Do you have or have access to (through a family member/friend) a smartphone with video capability that we can use for your visit?" a. If yes - list this number in appt notes as "cell" (if different from BEST phone #) and list the appointment type as a VIDEO visit in appointment notes b. If no - list the appointment type as a PHONE visit in appointment notes  3. Confirm consent - "In the setting of the current Covid19 crisis, you are scheduled for a (phone or video) visit with your provider on (date) at (time).  Just as we do with many in-office visits, in order for you to participate in this visit, we must obtain consent.  If you'd like, I can send this to your mychart (if signed up) or email for you to review.  Otherwise, I can obtain your verbal consent now.  All virtual visits are billed to your insurance company just like a normal visit would be.  By agreeing to a virtual visit, we'd like you to understand that the technology does not allow for your provider to perform an examination, and thus may limit your provider's ability to fully assess your condition. If your provider identifies any concerns that need to be evaluated in person, we will make arrangements to do so.  Finally, though the technology is pretty good, we cannot assure that it will always work on either your or our end, and in the setting of a video visit, we may have to convert it to a phone-only visit.  In either situation, we cannot ensure that we have a secure connection.  Are you willing to proceed?" Yes  4. Advise patient to be prepared - "Two hours prior to your appointment, go  ahead and check your blood pressure, pulse, oxygen saturation, and your weight (if you have the equipment to check those) and write them all down. When your visit starts, your provider will ask you for this information. If you have an Apple Watch or Kardia device, please plan to have heart rate information ready on the day of your appointment. Please have a pen and paper handy nearby the day of the visit as well."  5. Give patient instructions for MyChart download to smartphone OR Doximity/Doxy.me as below if video visit (depending on what platform provider is using)  6. Inform patient they will receive a phone call 15 minutes prior to their appointment time (may be from unknown caller ID) so they should be prepared to answer    TELEPHONE CALL NOTE  [name] has been deemed a candidate for a follow-up tele-health visit to limit community exposure during the Covid-19 pandemic. I spoke with the patient via phone to ensure availability of phone/video source, confirm preferred email & phone number, and discuss instructions and expectations.  I reminded [name] to be prepared with any vital sign and/or heart rhythm information that could potentially be obtained via home monitoring, at the time of his visit. I reminded [name] to expect a phone call prior to his visit.  Sandi Mariscal, RN 01/18/2020 9:27 AM   INSTRUCTIONS  FOR DOWNLOADING THE MYCHART APP TO SMARTPHONE  - The patient must first make sure to have activated MyChart and know their login information - If Apple, go to [employer] and type in MyChart in the search bar and download the app. If Android, ask patient to go to [employer] and type in [place] in the search bar and download the app. The app is free but as with any other app downloads, their phone may require them to verify saved payment information or Apple/Android password.  - The patient will need to then log into the app with their MyChart username  and password, and select [facility] as their healthcare provider to link the account. When it is time for your visit, go to the MyChart app, find appointments, and click Begin Video Visit. Be sure to Select Allow for your device to access the Microphone and Camera for your visit. You will then be connected, and your provider will be with you shortly.  **If they have any issues connecting, or need assistance please contact MyChart service desk [phone])**  **If using a computer, in order to ensure the best quality for their visit they will need to use either of the following Internet Browsers: [employer], or Google Chrome**  IF USING DOXIMITY or DOXY.ME - The patient will receive a link just prior to their visit by text.     FULL LENGTH CONSENT FOR TELE-HEALTH VISIT   I hereby voluntarily request, consent and authorize [facility] and its employed or contracted physicians, physician assistants, nurse practitioners or other licensed health care professionals (the Practitioner), to provide me with telemedicine health care services (the "Services") as deemed necessary by the treating Practitioner. I acknowledge and consent to receive the Services by the Practitioner via telemedicine. I understand that the telemedicine visit will involve communicating with the Practitioner through live audiovisual communication technology and the disclosure of certain medical information by electronic transmission. I acknowledge that I have been given the opportunity to request an in-person assessment or other available alternative prior to the telemedicine visit and am voluntarily participating in the telemedicine visit.  I understand that I have the right to withhold or withdraw my consent to the use of telemedicine in the course of my care at any time, without affecting my right to future care or treatment, and that the Practitioner or I may terminate the telemedicine visit at any time. I  understand that I have the right to inspect all information obtained and/or recorded in the course of the telemedicine visit and may receive copies of available information for a reasonable fee.  I understand that some of the potential risks of receiving the Services via telemedicine include:  Marland Kitchen Delay or interruption in medical evaluation due to technological equipment failure or disruption; . Information transmitted may not be sufficient (e.g. poor resolution of images) to allow for appropriate medical decision making by the Practitioner; and/or  . In rare instances, security protocols could fail, causing a breach of personal health information.  Furthermore, I acknowledge that it is my responsibility to provide information about my medical history, conditions and care that is complete and accurate to the best of my ability. I acknowledge that Practitioner's advice, recommendations, and/or decision may be based on factors not within their control, such as incomplete or inaccurate data provided by me or distortions of diagnostic images or specimens that may result from electronic transmissions. I understand that the practice of medicine is not an exact science and  that Practitioner makes no warranties or guarantees regarding treatment outcomes. I acknowledge that I will receive a copy of this consent concurrently upon execution via email to the email address I last provided but may also request a printed copy by calling the office of [facility].    I understand that my insurance will be billed for this visit.   I have read or had this consent read to me. . I understand the contents of this consent, which adequately explains the benefits and risks of the Services being provided via telemedicine.  . I have been provided ample opportunity to ask questions regarding this consent and the Services and have had my questions answered to my satisfaction. . I give my informed consent for the services to be  provided through the use of telemedicine in my medical care  By participating in this telemedicine visit I agree to the above.

## 2020-01-21 ENCOUNTER — Other Ambulatory Visit: Payer: Self-pay | Admitting: *Deleted

## 2020-01-21 DIAGNOSIS — E785 Hyperlipidemia, unspecified: Secondary | ICD-10-CM

## 2020-01-21 DIAGNOSIS — Z79899 Other long term (current) drug therapy: Secondary | ICD-10-CM

## 2020-01-21 DIAGNOSIS — I480 Paroxysmal atrial fibrillation: Secondary | ICD-10-CM

## 2020-01-21 DIAGNOSIS — E1169 Type 2 diabetes mellitus with other specified complication: Secondary | ICD-10-CM

## 2020-01-21 DIAGNOSIS — Z5181 Encounter for therapeutic drug level monitoring: Secondary | ICD-10-CM

## 2020-01-21 DIAGNOSIS — E669 Obesity, unspecified: Secondary | ICD-10-CM

## 2020-01-21 DIAGNOSIS — I10 Essential (primary) hypertension: Secondary | ICD-10-CM

## 2020-01-23 ENCOUNTER — Telehealth (HOSPITAL_COMMUNITY): Payer: Self-pay

## 2020-01-23 NOTE — Telephone Encounter (Signed)
Spoke with Mr. [name] to confirmed echo appt for 01/24/2020. Mr. [name] stated that with his discussion with Dr. Royann Shivers, he is requesting to have the echo canceled until you "see all his numbers". Echo has been canceled per patient and please send [place] a note if you need this rescheduled.

## 2020-01-24 ENCOUNTER — Other Ambulatory Visit: Payer: Medicare PPO | Admitting: *Deleted

## 2020-01-24 ENCOUNTER — Other Ambulatory Visit: Payer: Self-pay

## 2020-01-24 ENCOUNTER — Other Ambulatory Visit (HOSPITAL_COMMUNITY): Payer: Medicare PPO

## 2020-01-24 DIAGNOSIS — E785 Hyperlipidemia, unspecified: Secondary | ICD-10-CM

## 2020-01-24 DIAGNOSIS — E669 Obesity, unspecified: Secondary | ICD-10-CM

## 2020-01-24 DIAGNOSIS — I480 Paroxysmal atrial fibrillation: Secondary | ICD-10-CM

## 2020-01-24 DIAGNOSIS — Z5181 Encounter for therapeutic drug level monitoring: Secondary | ICD-10-CM

## 2020-01-24 DIAGNOSIS — I10 Essential (primary) hypertension: Secondary | ICD-10-CM

## 2020-01-24 DIAGNOSIS — E1169 Type 2 diabetes mellitus with other specified complication: Secondary | ICD-10-CM

## 2020-01-24 DIAGNOSIS — I5022 Chronic systolic (congestive) heart failure: Secondary | ICD-10-CM

## 2020-01-24 LAB — LIPID PANEL
Chol/HDL Ratio: 4.5 ratio (ref 0.0–5.0)
Cholesterol, Total: 152 mg/dL (ref 100–199)
HDL: 34 mg/dL — ABNORMAL LOW (ref >39)
LDL Chol Calc (NIH): 98 mg/dL (ref 0–99)
Triglycerides: 111 mg/dL (ref 0–149)
VLDL Cholesterol Cal: 20 mg/dL (ref 5–40)

## 2020-01-24 LAB — CBC
Hematocrit: 37.2 % — ABNORMAL LOW (ref 37.5–51.0)
Hemoglobin: 10.9 g/dL — ABNORMAL LOW (ref 13.0–17.7)
MCH: 22.3 pg — ABNORMAL LOW (ref 26.6–33.0)
MCHC: 29.3 g/dL — ABNORMAL LOW (ref 31.5–35.7)
MCV: 76 fL — ABNORMAL LOW (ref 79–97)
Platelets: 264 10*3/uL (ref 150–450)
RBC: 4.89 x10E6/uL (ref 4.14–5.80)
RDW: 17.2 % — ABNORMAL HIGH (ref 11.6–15.4)
WBC: 9.1 10*3/uL (ref 3.4–10.8)

## 2020-01-24 LAB — COMPREHENSIVE METABOLIC PANEL
ALT: 10 IU/L (ref 0–44)
AST: 14 IU/L (ref 0–40)
Albumin/Globulin Ratio: 1.3 (ref 1.2–2.2)
Albumin: 3.9 g/dL (ref 3.7–4.7)
Alkaline Phosphatase: 45 IU/L (ref 39–117)
BUN/Creatinine Ratio: 9 — ABNORMAL LOW (ref 10–24)
BUN: 19 mg/dL (ref 8–27)
Bilirubin Total: 0.4 mg/dL (ref 0.0–1.2)
CO2: 23 mmol/L (ref 20–29)
Calcium: 9 mg/dL (ref 8.6–10.2)
Chloride: 97 mmol/L (ref 96–106)
Creatinine, Ser: 2.2 mg/dL — ABNORMAL HIGH (ref 0.76–1.27)
GFR calc Af Amer: 33 mL/min/{1.73_m2} — ABNORMAL LOW (ref >59)
GFR calc non Af Amer: 29 mL/min/{1.73_m2} — ABNORMAL LOW (ref >59)
Globulin, Total: 2.9 g/dL (ref 1.5–4.5)
Glucose: 211 mg/dL — ABNORMAL HIGH (ref 65–99)
Potassium: 4.5 mmol/L (ref 3.5–5.2)
Sodium: 138 mmol/L (ref 134–144)
Total Protein: 6.8 g/dL (ref 6.0–8.5)

## 2020-01-24 LAB — TSH: TSH: 5.2 u[IU]/mL — ABNORMAL HIGH (ref 0.450–4.500)

## 2020-01-24 LAB — HEMOGLOBIN A1C
Est. average glucose Bld gHb Est-mCnc: 229 mg/dL
Hgb A1c MFr Bld: 9.6 % — ABNORMAL HIGH (ref 4.8–5.6)

## 2020-01-24 LAB — T3: T3, Total: 67 ng/dL — ABNORMAL LOW (ref 71–180)

## 2020-01-24 LAB — MAGNESIUM: Magnesium: 2.2 mg/dL (ref 1.6–2.3)

## 2020-01-24 LAB — T4: T4, Total: 9.5 ug/dL (ref 4.5–12.0)

## 2020-01-25 ENCOUNTER — Other Ambulatory Visit: Payer: Self-pay | Admitting: *Deleted

## 2020-01-25 ENCOUNTER — Telehealth: Payer: Self-pay | Admitting: *Deleted

## 2020-01-25 DIAGNOSIS — I10 Essential (primary) hypertension: Secondary | ICD-10-CM

## 2020-01-25 DIAGNOSIS — I480 Paroxysmal atrial fibrillation: Secondary | ICD-10-CM

## 2020-01-25 LAB — BRAIN NATRIURETIC PEPTIDE: BNP: 210.7 pg/mL — ABNORMAL HIGH (ref 0.0–100.0)

## 2020-01-25 MED ORDER — FERROUS SULFATE 325 (65 FE) MG PO TBEC: 325.0000 mg | DELAYED_RELEASE_TABLET | Freq: Two times a day (BID) | ORAL | 5 refills | Status: AC

## 2020-01-25 MED ORDER — DOCUSATE SODIUM 100 MG PO CAPS: 100.0000 mg | ORAL_CAPSULE | Freq: Two times a day (BID) | ORAL | 0 refills | Status: DC

## 2020-01-25 MED ORDER — EZETIMIBE 10 MG PO TABS: 10.0000 mg | ORAL_TABLET | Freq: Every day | ORAL | 5 refills | Status: DC

## 2020-01-25 MED ORDER — SPIRONOLACTONE 25 MG PO TABS: 12.5000 mg | ORAL_TABLET | Freq: Every day | ORAL | 5 refills | Status: AC

## 2020-01-25 NOTE — Telephone Encounter (Signed)
-----  Message from Sanda Klein, MD sent at 01/25/2020  1:19 PM EST ----- -Glucose control has deteriorated.  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  Needs to work on that with his endocrinologist. - Cholesterol is also higher than it's been in the past.  Not at target (LDL 98 instead of less than 70).  Would recommend adding ezetimibe 10 mg once daily. -Thyroid tests are very mildly abnormal, likely due to amiodarone, but no adjustment in medications is necessary.  We will repeat in 6 months. -Remains mildly anemic, unchanged from last year.  Please confirm that he still taking his iron supplement, since I do not see it on his list.  If not taking, he should be on ferrous sulfate 325 mg twice daily. -Potassium and magnesium are in normal range. -Kidney function is a little worse.  Please reduce spironolactone to 12.5 mg once daily.  Check a bmet in 1 month.  Check a c-Met and TSH and free T4 in 6 months.

## 2020-01-25 NOTE — Telephone Encounter (Signed)
The patient has verbalized his understanding and the Colace has been sent in.

## 2020-01-25 NOTE — Telephone Encounter (Signed)
Colace 100 mg twice daily

## 2020-01-25 NOTE — Telephone Encounter (Signed)
The patient and the wife have been made aware of the results and the advice.  Spironolactone has been decreased to 12.5 mg once daily (half a tablet) Ferrous Sulfate 325 mg twice daily has been sent in with instructions to check the price over the counter as well. Ezetimibe (Zetia) 10 mg once daily has been sent in as well.  The patient would like to know if he can have a prescription for a stool softener as well due to possible constipation from the iron.

## 2020-01-28 ENCOUNTER — Other Ambulatory Visit: Payer: Self-pay

## 2020-01-28 MED ORDER — APIXABAN 5 MG PO TABS: 5.0000 mg | ORAL_TABLET | Freq: Two times a day (BID) | ORAL | 1 refills | Status: DC

## 2020-02-04 ENCOUNTER — Ambulatory Visit (INDEPENDENT_AMBULATORY_CARE_PROVIDER_SITE_OTHER): Payer: Medicare PPO

## 2020-02-04 DIAGNOSIS — I5042 Chronic combined systolic (congestive) and diastolic (congestive) heart failure: Secondary | ICD-10-CM

## 2020-02-04 DIAGNOSIS — Z95810 Presence of automatic (implantable) cardiac defibrillator: Secondary | ICD-10-CM

## 2020-02-05 ENCOUNTER — Telehealth (HOSPITAL_COMMUNITY): Payer: Self-pay | Admitting: Vascular Surgery

## 2020-02-05 ENCOUNTER — Other Ambulatory Visit (HOSPITAL_COMMUNITY): Payer: Self-pay | Admitting: *Deleted

## 2020-02-05 MED ORDER — METOPROLOL SUCCINATE ER 50 MG PO TB24: ORAL_TABLET | ORAL | 3 refills | Status: AC

## 2020-02-05 NOTE — Telephone Encounter (Signed)
Left pt VM to make echo appt in office before May 25th appt w/ DB

## 2020-02-06 ENCOUNTER — Other Ambulatory Visit (HOSPITAL_COMMUNITY): Payer: Self-pay

## 2020-02-06 ENCOUNTER — Other Ambulatory Visit: Payer: Self-pay

## 2020-02-06 MED ORDER — ATORVASTATIN CALCIUM 80 MG PO TABS: 80.0000 mg | ORAL_TABLET | Freq: Every day | ORAL | 3 refills | Status: AC

## 2020-02-08 ENCOUNTER — Telehealth: Payer: Self-pay

## 2020-02-08 NOTE — Telephone Encounter (Signed)
Remote ICM transmission received.  Attempted call to patient regarding ICM remote transmission and left detailed message per DPR.  Advised to return call for any fluid symptoms or questions. Next ICM remote transmission scheduled 03/10/2020.     

## 2020-02-08 NOTE — Progress Notes (Signed)
EPIC Encounter for ICM Monitoring  Patient Name: [name] is a 74 y.o. male Date: 02/08/2020 Primary Care Physican: Devra Dopp, MD Primary Cardiologist: Croitoru/Bensimhon Electrophysiologist: Graciela Husbands 01/18/2020 Weight:420lbs at 11/24/18 office visit   Attempted call to patient and unable to reach.  Left detailed message per DPR regarding transmission. Transmission reviewed.   OptiVolThoracic impedancenormal but suggesting possible fluid accumulation from 1/27/ - 2/14 and back to baseline 2/15.  Prescribed:Torsemide20 mgTake 40 mg in the morning and 20 mg in the evening. When your weight is 420 pounds and over, take 40 mg twice daily. Potassium take 1 tablet twice a day.  Labs: 05/01/2019 Creatinine1.88, BUN18, Potassium4.3, Sodium140, [id number] A complete set of results can be found in Results Review.  Recommendations: Left voice mail with ICM number and encouraged to call if experiencing any fluid symptoms.  Follow-up plan: ICM clinic phone appointment on 03/10/2020.   91 day device clinic remote transmission 03/19/2020.  OV with Dr Gala Romney 05/13/2020.  Copy of ICM check sent to Dr. Graciela Husbands.    3 month ICM trend: 02/04/2020    1 Year ICM trend:       Karie Soda, RN 02/08/2020 1:32 PM

## 2020-02-14 NOTE — Addendum Note (Signed)
Addended by: Noralee Space on: 02/14/2020 09:23 AM   Modules accepted: Orders

## 2020-02-26 ENCOUNTER — Encounter (HOSPITAL_COMMUNITY): Payer: Medicare PPO

## 2020-02-26 HISTORY — PX: SKIN BIOPSY: SHX1

## 2020-02-27 ENCOUNTER — Encounter (HOSPITAL_COMMUNITY): Payer: Self-pay

## 2020-02-27 ENCOUNTER — Other Ambulatory Visit: Payer: Self-pay

## 2020-02-27 ENCOUNTER — Emergency Department (HOSPITAL_COMMUNITY): Payer: Medicare PPO

## 2020-02-27 ENCOUNTER — Inpatient Hospital Stay (HOSPITAL_COMMUNITY)
Admission: EM | Admit: 2020-02-27 | Discharge: 2020-03-01 | DRG: 683 | Disposition: A | Discharge status: Home / Self Care | Payer: Medicare PPO | Attending: Internal Medicine | Admitting: Internal Medicine

## 2020-02-27 DIAGNOSIS — E1122 Type 2 diabetes mellitus with diabetic chronic kidney disease: Secondary | ICD-10-CM | POA: Diagnosis present

## 2020-02-27 DIAGNOSIS — R55 Syncope and collapse: Secondary | ICD-10-CM

## 2020-02-27 DIAGNOSIS — I25810 Atherosclerosis of coronary artery bypass graft(s) without angina pectoris: Secondary | ICD-10-CM | POA: Diagnosis present

## 2020-02-27 DIAGNOSIS — E781 Pure hyperglyceridemia: Secondary | ICD-10-CM | POA: Diagnosis present

## 2020-02-27 DIAGNOSIS — E1165 Type 2 diabetes mellitus with hyperglycemia: Secondary | ICD-10-CM | POA: Diagnosis present

## 2020-02-27 DIAGNOSIS — I255 Ischemic cardiomyopathy: Secondary | ICD-10-CM | POA: Diagnosis present

## 2020-02-27 DIAGNOSIS — I480 Paroxysmal atrial fibrillation: Secondary | ICD-10-CM | POA: Diagnosis present

## 2020-02-27 DIAGNOSIS — Z79899 Other long term (current) drug therapy: Secondary | ICD-10-CM

## 2020-02-27 DIAGNOSIS — E861 Hypovolemia: Secondary | ICD-10-CM | POA: Diagnosis present

## 2020-02-27 DIAGNOSIS — Z7901 Long term (current) use of anticoagulants: Secondary | ICD-10-CM

## 2020-02-27 DIAGNOSIS — I5042 Chronic combined systolic (congestive) and diastolic (congestive) heart failure: Secondary | ICD-10-CM | POA: Diagnosis present

## 2020-02-27 DIAGNOSIS — Z955 Presence of coronary angioplasty implant and graft: Secondary | ICD-10-CM

## 2020-02-27 DIAGNOSIS — M109 Gout, unspecified: Secondary | ICD-10-CM | POA: Diagnosis present

## 2020-02-27 DIAGNOSIS — J9611 Chronic respiratory failure with hypoxia: Secondary | ICD-10-CM | POA: Diagnosis present

## 2020-02-27 DIAGNOSIS — R58 Hemorrhage, not elsewhere classified: Secondary | ICD-10-CM | POA: Diagnosis present

## 2020-02-27 DIAGNOSIS — Z794 Long term (current) use of insulin: Secondary | ICD-10-CM

## 2020-02-27 DIAGNOSIS — E785 Hyperlipidemia, unspecified: Secondary | ICD-10-CM | POA: Diagnosis present

## 2020-02-27 DIAGNOSIS — N183 Chronic kidney disease, stage 3 unspecified: Secondary | ICD-10-CM | POA: Diagnosis not present

## 2020-02-27 DIAGNOSIS — N1832 Chronic kidney disease, stage 3b: Secondary | ICD-10-CM | POA: Diagnosis present

## 2020-02-27 DIAGNOSIS — I252 Old myocardial infarction: Secondary | ICD-10-CM

## 2020-02-27 DIAGNOSIS — I130 Hypertensive heart and chronic kidney disease with heart failure and stage 1 through stage 4 chronic kidney disease, or unspecified chronic kidney disease: Secondary | ICD-10-CM | POA: Diagnosis present

## 2020-02-27 DIAGNOSIS — N179 Acute kidney failure, unspecified: Secondary | ICD-10-CM | POA: Diagnosis present

## 2020-02-27 DIAGNOSIS — Z6841 Body Mass Index (BMI) 40.0 and over, adult: Secondary | ICD-10-CM | POA: Diagnosis not present

## 2020-02-27 DIAGNOSIS — Z7902 Long term (current) use of antithrombotics/antiplatelets: Secondary | ICD-10-CM

## 2020-02-27 DIAGNOSIS — E662 Morbid (severe) obesity with alveolar hypoventilation: Secondary | ICD-10-CM | POA: Diagnosis present

## 2020-02-27 DIAGNOSIS — I2510 Atherosclerotic heart disease of native coronary artery without angina pectoris: Secondary | ICD-10-CM | POA: Diagnosis present

## 2020-02-27 DIAGNOSIS — I959 Hypotension, unspecified: Secondary | ICD-10-CM | POA: Diagnosis present

## 2020-02-27 DIAGNOSIS — D649 Anemia, unspecified: Secondary | ICD-10-CM | POA: Diagnosis not present

## 2020-02-27 DIAGNOSIS — Z20822 Contact with and (suspected) exposure to covid-19: Secondary | ICD-10-CM | POA: Diagnosis present

## 2020-02-27 DIAGNOSIS — Z87891 Personal history of nicotine dependence: Secondary | ICD-10-CM | POA: Diagnosis not present

## 2020-02-27 LAB — COMPREHENSIVE METABOLIC PANEL
ALT: 15 U/L (ref 0–44)
AST: 23 U/L (ref 15–41)
Albumin: 3.5 g/dL (ref 3.5–5.0)
Alkaline Phosphatase: 26 U/L — ABNORMAL LOW (ref 38–126)
Anion gap: 12 (ref 5–15)
BUN: 36 mg/dL — ABNORMAL HIGH (ref 8–23)
CO2: 26 mmol/L (ref 22–32)
Calcium: 8.9 mg/dL (ref 8.9–10.3)
Chloride: 99 mmol/L (ref 98–111)
Creatinine, Ser: 3.46 mg/dL — ABNORMAL HIGH (ref 0.61–1.24)
GFR calc Af Amer: 19 mL/min — ABNORMAL LOW (ref >60)
GFR calc non Af Amer: 17 mL/min — ABNORMAL LOW (ref >60)
Glucose, Bld: 170 mg/dL — ABNORMAL HIGH (ref 70–99)
Potassium: 5.1 mmol/L (ref 3.5–5.1)
Sodium: 137 mmol/L (ref 135–145)
Total Bilirubin: 0.4 mg/dL (ref 0.3–1.2)
Total Protein: 6.4 g/dL — ABNORMAL LOW (ref 6.5–8.1)

## 2020-02-27 LAB — CBG MONITORING, ED: Glucose-Capillary: 154 mg/dL — ABNORMAL HIGH (ref 70–99)

## 2020-02-27 LAB — TYPE AND SCREEN
ABO/RH(D): B POS
Antibody Screen: NEGATIVE

## 2020-02-27 LAB — LACTIC ACID, PLASMA: Lactic Acid, Venous: 2.9 mmol/L (ref 0.5–1.9)

## 2020-02-27 LAB — CBC WITH DIFFERENTIAL/PLATELET
Abs Immature Granulocytes: 0.08 10*3/uL — ABNORMAL HIGH (ref 0.00–0.07)
Basophils Absolute: 0 10*3/uL (ref 0.0–0.1)
Basophils Relative: 0 %
Eosinophils Absolute: 0 10*3/uL (ref 0.0–0.5)
Eosinophils Relative: 0 %
HCT: 35.9 % — ABNORMAL LOW (ref 39.0–52.0)
Hemoglobin: 10 g/dL — ABNORMAL LOW (ref 13.0–17.0)
Immature Granulocytes: 1 %
Lymphocytes Relative: 9 %
Lymphs Abs: 1.1 10*3/uL (ref 0.7–4.0)
MCH: 22.3 pg — ABNORMAL LOW (ref 26.0–34.0)
MCHC: 27.9 g/dL — ABNORMAL LOW (ref 30.0–36.0)
MCV: 80 fL (ref 80.0–100.0)
Monocytes Absolute: 0.6 10*3/uL (ref 0.1–1.0)
Monocytes Relative: 5 %
Neutro Abs: 11 10*3/uL — ABNORMAL HIGH (ref 1.7–7.7)
Neutrophils Relative %: 85 %
Platelets: 243 10*3/uL (ref 150–400)
RBC: 4.49 MIL/uL (ref 4.22–5.81)
RDW: 19.5 % — ABNORMAL HIGH (ref 11.5–15.5)
WBC: 12.8 10*3/uL — ABNORMAL HIGH (ref 4.0–10.5)
nRBC: 0 % (ref 0.0–0.2)

## 2020-02-27 LAB — BRAIN NATRIURETIC PEPTIDE: B Natriuretic Peptide: 67.6 pg/mL (ref 0.0–100.0)

## 2020-02-27 MED ORDER — SODIUM CHLORIDE 0.9 % IV BOLUS
500.0000 mL | Freq: Once | INTRAVENOUS | Status: AC
  Administered 2020-02-28: 05:00:00 500 mL via INTRAVENOUS

## 2020-02-27 MED ORDER — SODIUM CHLORIDE 0.9 % IV BOLUS
500.0000 mL | Freq: Once | INTRAVENOUS | Status: AC
  Administered 2020-02-27: 22:00:00 500 mL via INTRAVENOUS

## 2020-02-27 MED ORDER — SODIUM CHLORIDE 0.9 % IV SOLN: INTRAVENOUS | Status: DC

## 2020-02-27 MED ORDER — ONDANSETRON HCL 4 MG/2ML IJ SOLN
4.0000 mg | Freq: Once | INTRAMUSCULAR | Status: AC
  Administered 2020-02-27: 4 mg via INTRAVENOUS
  Filled 2020-02-27: qty 2

## 2020-02-27 NOTE — ED Provider Notes (Signed)
[facility] EMERGENCY DEPARTMENT Provider Note   CSN: [id number] Arrival date & time: 02/27/20  1952     History Chief Complaint  Patient presents with  . Hemorrhage  . Dizziness  . Hypotension    [name] is a 74 y.o. male.  HPI   Patient presents 1 day after elective removal of a keloid, now with concern for weakness.  Patient notes that he was generally better after the procedure, has not had other change in medication, including cessation of his Xarelto throughout the periprocedural time.  Today he noticed sustained bleeding from the wound, as well as ongoing weakness, fatigue, near syncope.  With all of these concerns he called EMS. EMS reports that the patient had increased work of breathing, was possibly hypotensive initially, though had a disproportionate increase on repeat vitals after receiving minimal fluid resuscitation.  Patient denies focal pain anywhere, acknowledges generalized weakness, lightheadedness, and ongoing dyspnea, which is not appreciably changed from baseline.  Past Medical History:  Diagnosis Date  . CAD (coronary artery disease)    a. CABG 1995 and multiple stents. b. Known graft occlusions - NSTEMI 06/2016 s/p DES to distal SVG-diag.  . Chronic systolic CHF (congestive heart failure) (HCC)   . CKD (chronic kidney disease), stage III   . Dietary noncompliance   . DM (diabetes mellitus) (HCC)   . DM type 2 causing CKD stage 3 (HCC)   . Dyslipidemia - low HDL and high triglycerides   . Gout   . Ischemic cardiomyopathy    a. LVEF 10-15% by cath 06/2016.  Marland Kitchen Leukocytosis   . Morbid obesity (HCC)   . Myocardial infarction (HCC)   . NSTEMI (non-ST elevated myocardial infarction) (HCC) 06/26/2016  . PAF (paroxysmal atrial fibrillation) (HCC)   . PONV (postoperative nausea and vomiting)   . Systemic hypertension   . Unstable angina (HCC) 07/21/2018    Patient Active Problem List   Diagnosis Date Noted  . Coronary artery disease  involving coronary bypass graft of native heart without angina pectoris 11/24/2018  . CAD S/P percutaneous coronary angioplasty 08/07/2018  . Acute on chronic combined systolic (congestive) and diastolic (congestive) heart failure (HCC) 07/21/2018  . Unstable angina (HCC) 07/21/2018  . Precordial chest pain 07/21/2018  . Hx of CABG   . AICD discharge   . Ventricular fibrillation (HCC) 03/10/2017  . Sustained VT (ventricular tachycardia) (HCC) 08/25/2016  . Dietary noncompliance 06/30/2016  . Ischemic cardiomyopathy 06/30/2016  . CKD (chronic kidney disease), stage III (HCC)   . Non-STEMI (non-ST elevated myocardial infarction) (HCC) 06/26/2016  . Essential hypertension 12/03/2015  . Paroxysmal atrial fibrillation (HCC) 12/03/2015  . At risk for sudden cardiac death 05-29-2015  . 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 STEMI (ST elevation myocardial infarction) (HCC) 01/29/2015  . ST elevation myocardial infarction (STEMI) involving other coronary artery of anterior wall (HCC)   . CAD s/p CABG 1995, graft dependent (LIMA to LAD, SVG to diagonal stented, other grafts occluded) 01/25/2014  . Cardiomyopathy, ischemic 01/25/2014  . Chronic combined systolic and diastolic CHF (congestive heart failure) (HCC) 01/25/2014  . Morbid obesity (HCC) 01/25/2014  . Controlled type 2 diabetes mellitus with stage 3 chronic kidney disease (HCC) 01/25/2014  . Gout 01/25/2014  . Dyslipidemia - low HDL and high triglycerides 01/25/2014    Past Surgical History:  Procedure Laterality Date  . CARDIAC CATHETERIZATION N/A 06/28/2016   Procedure: Left Heart Cath and Cors/Grafts Angiography;  Surgeon: Lennette Bihari, MD;  Location:  [facility] INVASIVE CV LAB;  Service: Cardiovascular;  Laterality: N/A;  . CARDIAC CATHETERIZATION N/A 06/29/2016   Procedure: Coronary Stent Intervention;  Surgeon: Lennette Biharihomas A Kelly, MD;  Location: [facility] INVASIVE CV LAB;  Service: Cardiovascular;  Laterality: N/A;  .  CORONARY ANGIOPLASTY WITH STENT PLACEMENT  01/12/2008   multivessel CAD occluded vein grafts to multiple sites including RCA,obtuse marginal branch & CX.  Successful PTCA and stenting distal graft.  . CORONARY ARTERY BYPASS GRAFT  1995  . CORONARY BALLOON ANGIOPLASTY N/A 03/16/2017   Procedure: Coronary Balloon Angioplasty;  Surgeon: Corky CraftsJayadeep S Varanasi, MD;  [facility];  Service: Cardiovascular;  Laterality: N/A;  . CORONARY STENT INTERVENTION N/A 07/25/2018   Procedure: CORONARY STENT INTERVENTION;  Surgeon: Corky CraftsVaranasi, Jayadeep S, MD;  Location: [facility] INVASIVE CV LAB;  Service: Cardiovascular;  Laterality: N/A;  . EP IMPLANTABLE DEVICE N/A 05/09/2015   Procedure: Icd Implant;  Surgeon: Thurmon FairMihai Croitoru, MD;  Location: [facility] INVASIVE CV LAB;  Service: Cardiovascular;  Laterality: N/A;  . LEFT HEART CATH AND CORS/GRAFTS ANGIOGRAPHY N/A 03/14/2017   Procedure: Left Heart Cath and Cors/Grafts Angiography;  Surgeon: Corky CraftsJayadeep S Varanasi, MD;  [facility];  Service: Cardiovascular;  Laterality: N/A;  . LEFT HEART CATH AND CORS/GRAFTS ANGIOGRAPHY N/A 07/24/2018   Procedure: LEFT HEART CATH AND CORS/GRAFTS ANGIOGRAPHY;  Surgeon: Runell GessBerry, Jonathan J, MD;  Location: [facility] INVAS[facility] CV LAB;  Service: Cardiovascular;  Laterality: N/A;  . LEFT HEART CATHETERIZATION WITH CORONARY ANGIOGRAM N/A 01/29/2015   Procedure: LEFT HEART CATHETERIZATION WITH CORONARY ANGIOGRAM;  Surgeon: Lennette Biharihomas A Kelly, MD;  [facility];  Service: Cardiovascular;  Laterality: N/A;  . THORACIC AORTOGRAM N/A 03/16/2017   Procedure: Thoracic Aortogram;  Surgeon: Corky CraftsJayadeep S Varanasi, MD;  [facility];  Service: Cardiovascular;  Laterality: N/A;  . TONSILLECTOMY         Family History  Problem Relation Age of Onset  . Heart failure Mother   . Dementia Mother   . Hearing loss Other   . Hypertension Other     Social History   Tobacco Use  . Smoking status: Former Smoker    Types: Cigarettes     Quit date: 03/10/2017    Years since quitting: 2.9  . Smokeless tobacco: Never Used  . Tobacco comment: "1-2 cigarettes per day"  Substance Use Topics  . Alcohol use: Not Currently    Alcohol/week: 0.0 standard drinks    Comment: rare  . Drug use: No    Home Medications Prior to Admission medications   Medication Sig Start Date End Date Taking? Authorizing Provider  acetaminophen (TYLENOL) 325 MG tablet Take 1-2 tablets (325-650 mg total) by mouth every 4 (four) hours as needed for mild pain. 05/10/15  Yes Kilroy, Eda PaschalLuke K, PA-C  amiodarone (PACERONE) 200 MG tablet Take 1 tablet (200 mg total) by mouth daily. 12/20/19  Yes Bensimhon, Bevelyn Bucklesaniel R, MD  apixaban (ELIQUIS) 5 MG TABS tablet Take 1 tablet (5 mg total) by mouth 2 (two) times daily. 01/28/20  Yes Croitoru, Mihai, MD  atorvastatin (LIPITOR) 80 MG tablet Take 1 tablet (80 mg total) by mouth daily. 02/06/20  Yes Bensimhon, Bevelyn Bucklesaniel R, MD  cholecalciferol (VITAMIN D) 1000 UNITS tablet Take 1,000 Units by mouth every other day.    Yes [provider]  clopidogrel (PLAVIX) 75 MG tablet Take 75 mg by mouth daily.   Yes [provider]  doxycycline (VIBRA-TABS) 100 MG tablet Take 100 mg by mouth 2 (two) times  daily.   Yes [provider]  ENTRESTO 49-51 MG TAKE 1 TABLET BY MOUTH TWICE DAILY Patient taking differently: Take 1 tablet by mouth 2 (two) times daily.  11/14/19  Yes Bensimhon, Bevelyn Buckles, MD  ezetimibe (ZETIA) 10 MG tablet Take 1 tablet (10 mg total) by mouth daily. 01/25/20 04/24/20 Yes Croitoru, Mihai, MD  ferrous sulfate 325 (65 FE) MG EC tablet Take 1 tablet (325 mg total) by mouth 2 (two) times daily. 01/25/20  Yes Croitoru, Mihai, MD  Hyaluronan (ORTHOVISC) 30 MG/2ML SOSY Inject 30 mg into the articular space daily. Every six months. Last dose was in August 2019 05/22/18  Yes [provider]  insulin glargine (LANTUS) 100 UNIT/ML injection Inject 0.5 mLs (50 Units total) into the skin every morning.  03/17/17  Yes Seiler, Amber K, NP  insulin lispro (HUMALOG KWIKPEN) 100 UNIT/ML KiwkPen Inject 20 Units into the skin every morning.  03/22/18  Yes [provider]  INVOKANA 100 MG TABS Take 100 mg by mouth daily. 12/28/13  Yes [provider]  isosorbide mononitrate (IMDUR) 30 MG 24 hr tablet TAKE 1 TABLET(30 MG) BY MOUTH DAILY 12/27/19  Yes Croitoru, Mihai, MD  metoprolol succinate (TOPROL-XL) 50 MG 24 hr tablet TAKE 1 TABLET(50 MG) BY MOUTH DAILY Patient taking differently: Take 50 mg by mouth daily.  02/05/20  Yes Bensimhon, Bevelyn Buckles, MD  nitroGLYCERIN (NITROSTAT) 0.4 MG SL tablet PLACE 1 TABLET UNDER THE TONGUE AS NEEDED EVERY 5 MINUTES FOR CHEST PAIN Patient taking differently: Place 0.4 mg under the tongue every 5 (five) minutes as needed. PLACE 1 TABLET UNDER THE TONGUE AS NEEDED EVERY 5 MINUTES FOR CHEST PAIN 09/20/18  Yes Croitoru, Mihai, MD  Omega-3 Fatty Acids (FISH OIL) 1200 MG CAPS Take 1,200 mg by mouth daily.    Yes [provider]  potassium chloride SA (K-DUR,KLOR-CON) 20 MEQ tablet TAKE 1 TABLET(20 MEQ) BY MOUTH TWICE DAILY 03/27/19  Yes Bensimhon, Bevelyn Buckles, MD  spironolactone (ALDACTONE) 25 MG tablet Take 0.5 tablets (12.5 mg total) by mouth daily. 01/25/20  Yes Croitoru, Mihai, MD  torsemide (DEMADEX) 20 MG tablet Take 40 mg in the morning and 20 mg in the evening. When your weight is 420 pounds and over, take 40 mg twice daily. 05/23/19  Yes Croitoru, Mihai, MD  VICTOZA 18 MG/3ML SOPN Inject 1.2 mg into the skin daily. 06/02/16  Yes [provider]  buPROPion (WELLBUTRIN XL) 150 MG 24 hr tablet Take 1 tablet (150 mg total) by mouth daily. Please follow up with your primary care physician for additional refills. Patient not taking: Reported on 02/27/2020 05/11/19   Duke Salvia, MD  docusate sodium (COLACE) 100 MG capsule Take 1 capsule (100 mg total) by mouth 2 (two) times daily. Patient not taking: Reported on 02/27/2020 01/25/20   Croitoru, Rachelle Hora, MD   sertraline (ZOLOFT) 50 MG tablet Take 1 tablet (50 mg total) by mouth daily. Patient not taking: Reported on 02/27/2020 12/20/19   Croitoru, Mihai, MD    Allergies    Coreg [carvedilol]  Review of Systems   Review of Systems  Constitutional:       Per HPI, otherwise negative  HENT:       Per HPI, otherwise negative  Respiratory:       Per HPI, otherwise negative  Cardiovascular:       Per HPI, otherwise negative  Gastrointestinal: Negative for vomiting.  Endocrine:       Negative aside from HPI  Genitourinary:  Neg aside from HPI   Musculoskeletal:       Per HPI, otherwise negative  Skin: Positive for wound.  Neurological: Positive for weakness. Negative for syncope.    Physical Exam Updated Vital Signs BP (!) 98/58 (BP Location: Left Wrist)   Pulse 60   Temp 98 F (36.7 C) (Oral)   Resp 17   Ht 6\' 7"  (2.007 m)   Wt (!) 191 kg   SpO2 100%   BMI 47.44 kg/m   Physical Exam Vitals and nursing note reviewed.  Constitutional:      Appearance: He is well-developed. He is obese. He is ill-appearing.     Comments: Morbidly obese elderly male with Ventimask in place uncomfortable appearing.  HENT:     Head: Normocephalic and atraumatic.  Eyes:     Conjunctiva/sclera: Conjunctivae normal.  Cardiovascular:     Rate and Rhythm: Regular rhythm. Tachycardia present.  Pulmonary:     Effort: Tachypnea present. No respiratory distress.     Breath sounds: No stridor.  Abdominal:     General: There is no distension.  Skin:    General: Skin is warm and dry.       Neurological:     Mental Status: He is alert and oriented to person, place, and time.     ED Results / Procedures / Treatments   Labs (all labs ordered are listed, but only abnormal results are displayed) Labs Reviewed  COMPREHENSIVE METABOLIC PANEL - Abnormal; Notable for the following components:      Result Value   Glucose, Bld 170 (*)    BUN 36 (*)    Creatinine, Ser 3.46 (*)    Total Protein  6.4 (*)    Alkaline Phosphatase 26 (*)    GFR calc non Af Amer 17 (*)    GFR calc Af Amer 19 (*)    All other components within normal limits  CBC WITH DIFFERENTIAL/PLATELET - Abnormal; Notable for the following components:   WBC 12.8 (*)    Hemoglobin 10.0 (*)    HCT 35.9 (*)    MCH 22.3 (*)    MCHC 27.9 (*)    RDW 19.5 (*)    Neutro Abs 11.0 (*)    Abs Immature Granulocytes 0.08 (*)    All other components within normal limits  LACTIC ACID, PLASMA - Abnormal; Notable for the following components:   Lactic Acid, Venous 2.9 (*)    All other components within normal limits  CBG MONITORING, ED - Abnormal; Notable for the following components:   Glucose-Capillary 154 (*)    All other components within normal limits  SARS CORONAVIRUS 2 (TAT 6-24 HRS)  BRAIN NATRIURETIC PEPTIDE  LACTIC ACID, PLASMA  POC SARS CORONAVIRUS 2 AG -  ED  TYPE AND SCREEN  ABO/RH    EKG Rate 63, A. fib, with frequent pacemaker spikes, abnormal EKG  Radiology DG Chest Port 1 View  Result Date: 02/27/2020 CLINICAL DATA:  Hypotensive EXAM: PORTABLE CHEST 1 VIEW COMPARISON:  09/21/2018 FINDINGS: Left-sided pacing device with poor visibility of the intracardiac leads. Post sternotomy changes. Low lung volumes. Cardiomegaly without edema. Aortic atherosclerosis. No pneumothorax. IMPRESSION: No active disease.  Low lung volumes.  Stable cardiomegaly Electronically Signed   By: 11/21/2018 M.D.   On: 02/27/2020 21:09    Procedures Procedures (including critical care time)  Medications Ordered in ED Medications  0.9 %  sodium chloride infusion (has no administration in time range)  sodium chloride 0.9 % bolus 500  mL (has no administration in time range)  sodium chloride 0.9 % bolus 500 mL (500 mLs Intravenous New Bag/Given 02/27/20 2225)  ondansetron (ZOFRAN) injection 4 mg (4 mg Intravenous Given 02/27/20 2227)    ED Course  I have reviewed the triage vital signs and the nursing notes.  Pertinent labs  & imaging results that were available during my care of the patient were reviewed by me and considered in my medical decision making (see chart for details).   11:42 PM Patient appears more calm than on arrival, continues to have no evidence for bleeding.  However, labs are notable for evidence for acute kidney injury, with elevated BUN, creatinine 50% beyond most recent value.  Patient was found to have mild leukocytosis, mild lactic acidosis, but no fever, no x-ray evidence for pneumonia.  These additional findings may be secondary to reaction versus occult infection, though this seems less likely given absence of other notable findings.  The wound in concern appears generally well, though it is open, by design.  Given concern for acute kidney injury in the setting of chronic kidney disease, CHF, need for gentle fluid resuscitation, medication titration before discharge, the patient was admitted for further monitoring, management.  Doxycycline has been ordered (home med)  Covid pending on admission. Final Clinical Impression(s) / ED Diagnoses Final diagnoses:  AKI (acute kidney injury) ([place])  Near syncope     Carmin Muskrat, MD 02/27/20 2351

## 2020-02-27 NOTE — ED Triage Notes (Signed)
Pt arrives via [facility] c/o hemorrhaging after having a keloid removal on his back yesterday. Per EMS several hand towels were saturated with blood on arrival. Hemorrhaging controlled with pressure dressing. A&Ox4, GCS 15. Initially hypotensive 78/p. Current VS: BP 97/58, HR 64 (a-fib), RR 18, SPO2 98% RA after 500 mL NS bolus.

## 2020-02-28 ENCOUNTER — Encounter (HOSPITAL_COMMUNITY): Payer: Self-pay | Admitting: Family Medicine

## 2020-02-28 ENCOUNTER — Inpatient Hospital Stay (HOSPITAL_COMMUNITY): Payer: Medicare PPO

## 2020-02-28 DIAGNOSIS — D649 Anemia, unspecified: Secondary | ICD-10-CM | POA: Diagnosis present

## 2020-02-28 DIAGNOSIS — R58 Hemorrhage, not elsewhere classified: Secondary | ICD-10-CM | POA: Diagnosis present

## 2020-02-28 DIAGNOSIS — J9611 Chronic respiratory failure with hypoxia: Secondary | ICD-10-CM | POA: Insufficient documentation

## 2020-02-28 DIAGNOSIS — R55 Syncope and collapse: Secondary | ICD-10-CM | POA: Insufficient documentation

## 2020-02-28 DIAGNOSIS — I959 Hypotension, unspecified: Secondary | ICD-10-CM | POA: Diagnosis present

## 2020-02-28 LAB — CREATININE, URINE, RANDOM: Creatinine, Urine: 229.53 mg/dL

## 2020-02-28 LAB — URINALYSIS, ROUTINE W REFLEX MICROSCOPIC
Bacteria, UA: NONE SEEN
Bilirubin Urine: NEGATIVE
Glucose, UA: >=500 mg/dL — AB
Hgb urine dipstick: NEGATIVE
Ketones, ur: NEGATIVE mg/dL
Leukocytes,Ua: NEGATIVE
Nitrite: NEGATIVE
Protein, ur: NEGATIVE mg/dL
Specific Gravity, Urine: 1.013 (ref 1.005–1.030)
pH: 5 (ref 5.0–8.0)

## 2020-02-28 LAB — BASIC METABOLIC PANEL
Anion gap: 9 (ref 5–15)
BUN: 37 mg/dL — ABNORMAL HIGH (ref 8–23)
CO2: 27 mmol/L (ref 22–32)
Calcium: 8.4 mg/dL — ABNORMAL LOW (ref 8.9–10.3)
Chloride: 102 mmol/L (ref 98–111)
Creatinine, Ser: 3.13 mg/dL — ABNORMAL HIGH (ref 0.61–1.24)
GFR calc Af Amer: 22 mL/min — ABNORMAL LOW (ref >60)
GFR calc non Af Amer: 19 mL/min — ABNORMAL LOW (ref >60)
Glucose, Bld: 127 mg/dL — ABNORMAL HIGH (ref 70–99)
Potassium: 5.2 mmol/L — ABNORMAL HIGH (ref 3.5–5.1)
Sodium: 138 mmol/L (ref 135–145)

## 2020-02-28 LAB — ABO/RH: ABO/RH(D): B POS

## 2020-02-28 LAB — CBG MONITORING, ED
Glucose-Capillary: 108 mg/dL — ABNORMAL HIGH (ref 70–99)
Glucose-Capillary: 121 mg/dL — ABNORMAL HIGH (ref 70–99)
Glucose-Capillary: 109 mg/dL — ABNORMAL HIGH (ref 70–99)

## 2020-02-28 LAB — SODIUM, URINE, RANDOM: Sodium, Ur: <10 mmol/L

## 2020-02-28 LAB — HEMATOCRIT: HCT: 29.8 % — ABNORMAL LOW (ref 39.0–52.0)

## 2020-02-28 LAB — SARS CORONAVIRUS 2 (TAT 6-24 HRS): SARS Coronavirus 2: NEGATIVE

## 2020-02-28 LAB — GLUCOSE, CAPILLARY
Glucose-Capillary: 136 mg/dL — ABNORMAL HIGH (ref 70–99)
Glucose-Capillary: 130 mg/dL — ABNORMAL HIGH (ref 70–99)

## 2020-02-28 LAB — HEMOGLOBIN: Hemoglobin: 8.5 g/dL — ABNORMAL LOW (ref 13.0–17.0)

## 2020-02-28 MED ORDER — HYDROCODONE-ACETAMINOPHEN 5-325 MG PO TABS
1.0000 | ORAL_TABLET | ORAL | Status: DC | PRN
  Filled 2020-02-28: qty 1

## 2020-02-28 MED ORDER — SENNOSIDES-DOCUSATE SODIUM 8.6-50 MG PO TABS: 1.0000 | ORAL_TABLET | Freq: Every evening | ORAL | Status: DC | PRN

## 2020-02-28 MED ORDER — INSULIN ASPART 100 UNIT/ML ~~LOC~~ SOLN
0.0000 [IU] | Freq: Three times a day (TID) | SUBCUTANEOUS | Status: DC
  Administered 2020-02-28 – 2020-02-29 (×3): 2 [IU] via SUBCUTANEOUS
  Administered 2020-02-29: 18:00:00 3 [IU] via SUBCUTANEOUS

## 2020-02-28 MED ORDER — ATORVASTATIN CALCIUM 80 MG PO TABS
80.0000 mg | ORAL_TABLET | Freq: Every day | ORAL | Status: DC
  Administered 2020-02-28 – 2020-02-29 (×2): 80 mg via ORAL
  Filled 2020-02-28 (×2): qty 1

## 2020-02-28 MED ORDER — SODIUM CHLORIDE 0.9 % IV BOLUS
1000.0000 mL | Freq: Once | INTRAVENOUS | Status: AC
  Administered 2020-02-28: 06:00:00 1000 mL via INTRAVENOUS

## 2020-02-28 MED ORDER — ACETAMINOPHEN 325 MG PO TABS: 650.0000 mg | ORAL_TABLET | Freq: Four times a day (QID) | ORAL | Status: DC | PRN

## 2020-02-28 MED ORDER — SODIUM CHLORIDE 0.9% FLUSH
3.0000 mL | Freq: Two times a day (BID) | INTRAVENOUS | Status: DC
  Administered 2020-02-29 – 2020-03-01 (×3): 3 mL via INTRAVENOUS

## 2020-02-28 MED ORDER — INSULIN ASPART 100 UNIT/ML ~~LOC~~ SOLN: 0.0000 [IU] | Freq: Every day | SUBCUTANEOUS | Status: DC

## 2020-02-28 MED ORDER — SODIUM CHLORIDE 0.9 % IV SOLN: INTRAVENOUS | Status: DC

## 2020-02-28 MED ORDER — CLOPIDOGREL BISULFATE 75 MG PO TABS
75.0000 mg | ORAL_TABLET | Freq: Every day | ORAL | Status: DC
  Administered 2020-02-28 – 2020-03-01 (×3): 75 mg via ORAL
  Filled 2020-02-28 (×4): qty 1

## 2020-02-28 MED ORDER — DOXYCYCLINE HYCLATE 100 MG PO TABS
100.0000 mg | ORAL_TABLET | Freq: Two times a day (BID) | ORAL | Status: DC
  Administered 2020-02-28 – 2020-03-01 (×5): 100 mg via ORAL
  Filled 2020-02-28 (×5): qty 1

## 2020-02-28 MED ORDER — INSULIN GLARGINE 100 UNIT/ML ~~LOC~~ SOLN
30.0000 [IU] | Freq: Every day | SUBCUTANEOUS | Status: DC
  Administered 2020-02-28 – 2020-03-01 (×3): 30 [IU] via SUBCUTANEOUS
  Filled 2020-02-28 (×3): qty 0.3

## 2020-02-28 MED ORDER — ACETAMINOPHEN 650 MG RE SUPP: 650.0000 mg | Freq: Four times a day (QID) | RECTAL | Status: DC | PRN

## 2020-02-28 MED ORDER — ISOSORBIDE MONONITRATE ER 30 MG PO TB24
30.0000 mg | ORAL_TABLET | Freq: Every day | ORAL | Status: DC
  Administered 2020-02-28: 30 mg via ORAL
  Filled 2020-02-28: qty 1

## 2020-02-28 MED ORDER — AMIODARONE HCL 200 MG PO TABS
200.0000 mg | ORAL_TABLET | Freq: Every day | ORAL | Status: DC
  Administered 2020-02-28 – 2020-03-01 (×3): 200 mg via ORAL
  Filled 2020-02-28 (×3): qty 1

## 2020-02-28 MED ORDER — SODIUM CHLORIDE 0.9 % IV BOLUS
500.0000 mL | Freq: Once | INTRAVENOUS | Status: AC
  Administered 2020-02-28: 14:00:00 500 mL via INTRAVENOUS

## 2020-02-28 MED ORDER — ONDANSETRON HCL 4 MG/2ML IJ SOLN: 4.0000 mg | Freq: Four times a day (QID) | INTRAMUSCULAR | Status: DC | PRN

## 2020-02-28 MED ORDER — SODIUM CHLORIDE 0.9 % IV BOLUS: 500.0000 mL | Freq: Once | INTRAVENOUS | Status: DC

## 2020-02-28 MED ORDER — ONDANSETRON HCL 4 MG PO TABS
4.0000 mg | ORAL_TABLET | Freq: Four times a day (QID) | ORAL | Status: DC | PRN
  Filled 2020-02-28: qty 1

## 2020-02-28 MED ORDER — ISOSORBIDE MONONITRATE ER 30 MG PO TB24
30.0000 mg | ORAL_TABLET | Freq: Every day | ORAL | Status: DC
  Administered 2020-02-28 – 2020-03-01 (×3): 30 mg via ORAL
  Filled 2020-02-28 (×3): qty 1

## 2020-02-28 NOTE — ED Notes (Signed)
Lunch Tray Ordered @ 1041. 

## 2020-02-28 NOTE — H&P (Addendum)
History and Physical    [name] CVE:[id number] DOB: August 10, 1946 DOA: 02/27/2020  PCP: Devra Dopp, MD   Patient coming from: Home   Chief Complaint: Bleeding from back where keloid was removed, lightheaded, generally weak   HPI: [name] is a 74 y.o. male with medical history significant for coronary artery disease, chronic combined systolic and diastolic CHF, chronic kidney disease stage IIIb, insulin-dependent diabetes mellitus, chronic supplemental oxygen requirement, and obesity (BMI 47), presenting to the emergency department for evaluation of bleeding from his back where the keloid was removed, lightheadedness, and generalized weakness.  Patient reports that he had a keloid removed from his back on 02/26/2020, was unable to stop bleeding from the site at home, continued to bleed into the evening of 02/27/2020, developed near syncope and generalized weakness, and called EMS.  Patient was found to have systolic blood pressure of 78 with EMS, hemostasis was achieved with pressure dressings, and then 500 cc of saline was administered prior to arrival in the ED.  Patient denies any headache, chest pain, recent fevers or chills, cough, or shortness of breath.  ED Course: Upon arrival to the ED, patient is found to be afebrile, saturating 100% on 6 Lpm oxygen, and with systolic blood pressure in the low 90s.  EKG features sinus rhythm with PVC and chronic LBBB.  Chest x-ray is negative for acute findings.  Chemistry panel notable for BUN of 36 and creatinine of 3.46, up from 2.20 a month earlier.  CBC notable for leukocytosis to 12,800 and hemoglobin 10.0, down from 10.9 a month ago.  Lactic acid is 2.9 and BNP is normal.  COVID-19 screening test is in process.  Patient was given a liter of saline in the ED, type and screen was performed, and hospitalists consulted for admission.  Review of Systems:  All other systems reviewed and apart from HPI, are negative.  Past Medical History:   Diagnosis Date  . CAD (coronary artery disease)    a. CABG 1995 and multiple stents. b. Known graft occlusions - NSTEMI 06/2016 s/p DES to distal SVG-diag.  . Chronic systolic CHF (congestive heart failure) (HCC)   . CKD (chronic kidney disease), stage III   . Dietary noncompliance   . DM (diabetes mellitus) (HCC)   . DM type 2 causing CKD stage 3 (HCC)   . Dyslipidemia - low HDL and high triglycerides   . Gout   . Ischemic cardiomyopathy    a. LVEF 10-15% by cath 06/2016.  Marland Kitchen Leukocytosis   . Morbid obesity (HCC)   . Myocardial infarction (HCC)   . NSTEMI (non-ST elevated myocardial infarction) (HCC) 06/26/2016  . PAF (paroxysmal atrial fibrillation) (HCC)   . PONV (postoperative nausea and vomiting)   . Systemic hypertension   . Unstable angina (HCC) 07/21/2018    Past Surgical History:  Procedure Laterality Date  . CARDIAC CATHETERIZATION N/A 06/28/2016   Procedure: Left Heart Cath and Cors/Grafts Angiography;  Surgeon: Lennette Bihari, MD;  Location: [facility] INVASIVE CV LAB;  Service: Cardiovascular;  Laterality: N/A;  . CARDIAC CATHETERIZATION N/A 06/29/2016   Procedure: Coronary Stent Intervention;  Surgeon: Lennette Bihari, MD;  Location: [facility] INVASIVE CV LAB;  Service: Cardiovascular;  Laterality: N/A;  . CORONARY ANGIOPLASTY WITH STENT PLACEMENT  01/12/2008   multivessel CAD occluded vein grafts to multiple sites including RCA,obtuse marginal branch & CX.  Successful PTCA and stenting distal graft.  . CORONARY ARTERY BYPASS GRAFT  1995  . CORONARY BALLOON ANGIOPLASTY N/A 03/16/2017  Procedure: Coronary Balloon Angioplasty;  Surgeon: Jettie Booze, MD;  Location: [place] CV LAB;  Service: Cardiovascular;  Laterality: N/A;  . CORONARY STENT INTERVENTION N/A 07/25/2018   Procedure: CORONARY STENT INTERVENTION;  Surgeon: Jettie Booze, MD;  Location: [place] CV LAB;  Service: Cardiovascular;  Laterality: N/A;  . EP IMPLANTABLE DEVICE N/A 05/09/2015   Procedure: Icd Implant;   Surgeon: Sanda Klein, MD;  Location: [facility] LAB;  Service: Cardiovascular;  Laterality: N/A;  . LEFT HEART CATH AND CORS/GRAFTS ANGIOGRAPHY N/A 03/14/2017   Procedure: Left Heart Cath and Cors/Grafts Angiography;  Surgeon: Jettie Booze, MD;  Location: [place] CV LAB;  Service: Cardiovascular;  Laterality: N/A;  . LEFT HEART CATH AND CORS/GRAFTS ANGIOGRAPHY N/A 07/24/2018   Procedure: LEFT HEART CATH AND CORS/GRAFTS ANGIOGRAPHY;  Surgeon: Lorretta Harp, MD;  Location: [place] CV LAB;  Service: Cardiovascular;  Laterality: N/A;  . LEFT HEART CATHETERIZATION WITH CORONARY ANGIOGRAM N/A 01/29/2015   Procedure: LEFT HEART CATHETERIZATION WITH CORONARY ANGIOGRAM;  Surgeon: Troy Sine, MD;  Location: [facility] CATH LAB;  Service: Cardiovascular;  Laterality: N/A;  . THORACIC AORTOGRAM N/A 03/16/2017   Procedure: Thoracic Aortogram;  Surgeon: Jettie Booze, MD;  Location: [place] [facility] CV LAB;  Service: Cardiovascular;  Laterality: N/A;  . TONSILLECTOMY       reports that he quit smoking about 2 years ago. His smoking use included cigarettes. He has never used smokeless tobacco. He reports previous alcohol use. He reports that he does not use drugs.  Allergies  Allergen Reactions  . Coreg [Carvedilol] Nausea And Vomiting    Family History  Problem Relation Age of Onset  . Heart failure Mother   . Dementia Mother   . Hearing loss Other   . Hypertension Other      Prior to Admission medications   Medication Sig Start Date End Date Taking? Authorizing Provider  acetaminophen (TYLENOL) 325 MG tablet Take 1-2 tablets (325-650 mg total) by mouth every 4 (four) hours as needed for mild pain. 05/10/15  Yes Kilroy, Doreene Burke, PA-C  amiodarone (PACERONE) 200 MG tablet Take 1 tablet (200 mg total) by mouth daily. 12/20/19  Yes Bensimhon, Shaune Pascal, MD  apixaban (ELIQUIS) 5 MG TABS tablet Take 1 tablet (5 mg total) by mouth 2 (two) times daily. 01/28/20  Yes Croitoru, Mihai, MD   atorvastatin (LIPITOR) 80 MG tablet Take 1 tablet (80 mg total) by mouth daily. 02/06/20  Yes Bensimhon, Shaune Pascal, MD  cholecalciferol (VITAMIN D) 1000 UNITS tablet Take 1,000 Units by mouth every other day.    Yes [provider]  clopidogrel (PLAVIX) 75 MG tablet Take 75 mg by mouth daily.   Yes [provider]  doxycycline (VIBRA-TABS) 100 MG tablet Take 100 mg by mouth 2 (two) times daily.   Yes [provider]  ENTRESTO 49-51 MG TAKE 1 TABLET BY MOUTH TWICE DAILY Patient taking differently: Take 1 tablet by mouth 2 (two) times daily.  11/14/19  Yes Bensimhon, Shaune Pascal, MD  ezetimibe (ZETIA) 10 MG tablet Take 1 tablet (10 mg total) by mouth daily. 01/25/20 04/24/20 Yes Croitoru, Mihai, MD  ferrous sulfate 325 (65 FE) MG EC tablet Take 1 tablet (325 mg total) by mouth 2 (two) times daily. 01/25/20  Yes Croitoru, Mihai, MD  Hyaluronan (ORTHOVISC) 30 MG/2ML SOSY Inject 30 mg into the articular space daily. Every six months. Last dose was in August 2019 05/22/18  Yes [provider]  insulin glargine (  LANTUS) 100 UNIT/ML injection Inject 0.5 mLs (50 Units total) into the skin every morning. 03/17/17  Yes Seiler, Amber K, NP  insulin lispro (HUMALOG KWIKPEN) 100 UNIT/ML KiwkPen Inject 20 Units into the skin every morning.  03/22/18  Yes [provider]  INVOKANA 100 MG TABS Take 100 mg by mouth daily. 12/28/13  Yes [provider]  isosorbide mononitrate (IMDUR) 30 MG 24 hr tablet TAKE 1 TABLET(30 MG) BY MOUTH DAILY 12/27/19  Yes Croitoru, Mihai, MD  metoprolol succinate (TOPROL-XL) 50 MG 24 hr tablet TAKE 1 TABLET(50 MG) BY MOUTH DAILY Patient taking differently: Take 50 mg by mouth daily.  02/05/20  Yes Bensimhon, Bevelyn Bucklesaniel R, MD  nitroGLYCERIN (NITROSTAT) 0.4 MG SL tablet PLACE 1 TABLET UNDER THE TONGUE AS NEEDED EVERY 5 MINUTES FOR CHEST PAIN Patient taking differently: Place 0.4 mg under the tongue every 5 (five) minutes as needed. PLACE 1 TABLET UNDER THE  TONGUE AS NEEDED EVERY 5 MINUTES FOR CHEST PAIN 09/20/18  Yes Croitoru, Mihai, MD  Omega-3 Fatty Acids (FISH OIL) 1200 MG CAPS Take 1,200 mg by mouth daily.    Yes [provider]  potassium chloride SA (K-DUR,KLOR-CON) 20 MEQ tablet TAKE 1 TABLET(20 MEQ) BY MOUTH TWICE DAILY 03/27/19  Yes Bensimhon, Bevelyn Bucklesaniel R, MD  spironolactone (ALDACTONE) 25 MG tablet Take 0.5 tablets (12.5 mg total) by mouth daily. 01/25/20  Yes Croitoru, Mihai, MD  torsemide (DEMADEX) 20 MG tablet Take 40 mg in the morning and 20 mg in the evening. When your weight is 420 pounds and over, take 40 mg twice daily. 05/23/19  Yes Croitoru, Mihai, MD  VICTOZA 18 MG/3ML SOPN Inject 1.2 mg into the skin daily. 06/02/16  Yes [provider]  buPROPion (WELLBUTRIN XL) 150 MG 24 hr tablet Take 1 tablet (150 mg total) by mouth daily. Please follow up with your primary care physician for additional refills. Patient not taking: Reported on 02/27/2020 05/11/19   Duke SalviaKlein, Steven C, MD  docusate sodium (COLACE) 100 MG capsule Take 1 capsule (100 mg total) by mouth 2 (two) times daily. Patient not taking: Reported on 02/27/2020 01/25/20   Croitoru, Rachelle HoraMihai, MD  sertraline (ZOLOFT) 50 MG tablet Take 1 tablet (50 mg total) by mouth daily. Patient not taking: Reported on 02/27/2020 12/20/19   Thurmon Fairroitoru, Mihai, MD    Physical Exam: Vitals:   02/27/20 2015 02/27/20 2030 02/27/20 2233 02/27/20 2337  BP: (!) 110/52 (!) 117/54 93/75 (!) 98/58  Pulse: (!) 59 61 67 60  Resp: 18 (!) 24 20 17   Temp:      TempSrc:      SpO2: 100% 100% 100% 100%  Weight:      Height:        Constitutional: NAD, calm  Eyes: PERTLA, lids and conjunctivae normal ENMT: Mucous membranes are moist. Posterior pharynx clear of any exudate or lesions.   Neck: normal, supple, no masses, no thyromegaly Respiratory: no wheezing, no crackles. No accessory muscle use.  Cardiovascular: S1 & S2 heard, regular rate and rhythm. No extremity edema.   Abdomen: No distension, no  tenderness, soft. Bowel sounds active.  Musculoskeletal: no clubbing / cyanosis. No joint deformity upper and lower extremities.   Skin: no significant rashes, lesions, ulcers. Warm, dry, well-perfused. Neurologic: No facial asymmetry. Sensation intact. Moving all extremities.  Psychiatric: Alert and oriented, appropriate throughout interview and exam. Very pleasant and cooperative.    Labs and Imaging on Admission: I have personally reviewed following labs and imaging studies  CBC: Recent  Labs  Lab 02/27/20 2059  WBC 12.8*  NEUTROABS 11.0*  HGB 10.0*  HCT 35.9*  MCV 80.0  PLT 243   Basic Metabolic Panel: Recent Labs  Lab 02/27/20 2059  NA 137  K 5.1  CL 99  CO2 26  GLUCOSE 170*  BUN 36*  CREATININE 3.46*  CALCIUM 8.9   GFR: Estimated Creatinine Clearance: 36.2 mL/min (A) (by C-G formula based on SCr of 3.46 mg/dL (H)). Liver Function Tests: Recent Labs  Lab 02/27/20 2059  AST 23  ALT 15  ALKPHOS 26*  BILITOT 0.4  PROT 6.4*  ALBUMIN 3.5   No results for input(s): LIPASE, AMYLASE in the last 168 hours. No results for input(s): AMMONIA in the last 168 hours. Coagulation Profile: No results for input(s): INR, PROTIME in the last 168 hours. Cardiac Enzymes: No results for input(s): CKTOTAL, CKMB, CKMBINDEX, TROPONINI in the last 168 hours. BNP (last 3 results) No results for input(s): PROBNP in the last 8760 hours. HbA1C: No results for input(s): HGBA1C in the last 72 hours. CBG: Recent Labs  Lab 02/27/20 2036  GLUCAP 154*   Lipid Profile: No results for input(s): CHOL, HDL, LDLCALC, TRIG, CHOLHDL, LDLDIRECT in the last 72 hours. Thyroid Function Tests: No results for input(s): TSH, T4TOTAL, FREET4, T3FREE, THYROIDAB in the last 72 hours. Anemia Panel: No results for input(s): VITAMINB12, FOLATE, FERRITIN, TIBC, IRON, RETICCTPCT in the last 72 hours. Urine analysis:    Component Value Date/Time   COLORURINE YELLOW 03/11/2017 0606   APPEARANCEUR  CLEAR 03/11/2017 0606   LABSPEC 1.024 03/11/2017 0606   PHURINE 5.0 03/11/2017 0606   GLUCOSEU >=500 (A) 03/11/2017 0606   HGBUR SMALL (A) 03/11/2017 0606   BILIRUBINUR NEGATIVE 03/11/2017 0606   KETONESUR NEGATIVE 03/11/2017 0606   PROTEINUR 100 (A) 03/11/2017 0606   NITRITE NEGATIVE 03/11/2017 0606   LEUKOCYTESUR NEGATIVE 03/11/2017 0606   Sepsis Labs: @LABRCNTIP (procalcitonin:4,lacticidven:4) )No results found for this or any previous visit (from the past 240 hour(s)).   Radiological Exams on Admission: DG Chest Port 1 View  Result Date: 02/27/2020 CLINICAL DATA:  Hypotensive EXAM: PORTABLE CHEST 1 VIEW COMPARISON:  09/21/2018 FINDINGS: Left-sided pacing device with poor visibility of the intracardiac leads. Post sternotomy changes. Low lung volumes. Cardiomegaly without edema. Aortic atherosclerosis. No pneumothorax. IMPRESSION: No active disease.  Low lung volumes.  Stable cardiomegaly Electronically Signed   By: 11/21/2018 M.D.   On: 02/27/2020 21:09    EKG: Independently reviewed. Sinus rhythm, PVCs, chronic LBBB.   Assessment/Plan   1. Acute kidney injury superimposed on CKD IIIb  - Presents with bleeding from back where keloid was removed, gen weakness, and near-syncope, and is found to be hypotensive with SCr of 3.46, up from 2.20 a month earlier  - Check FEUrea, hold Entresto and diuretics, continue IVF hydration mindful of CHF, renally-dose medications, and repeat chem panel in am   2. Hypotension  - Presents for evaluation of bleeding with weakness and presyncope, had SBP 78 with EMS and was given 500 cc saline pta  - SBP 90's in ED and lightheadedness improving  - Bleeding has stopped, no infectious s/s  - Hold antihypertensives, continue IVF hydration mindful of CHF   ADDENDUM:  BP had improved initially, now dropping again this am. There is no infectious s/s, no more bleeding, no chest pain. He is breathing fine laying flat on his back and can handle more fluid.  This is likely hypovolemic, will fluid-resuscitate, follow-up pending lactate and H&H.  3. Bleeding, postprocedural   - Patient reports significant bleeding after keloid removal from his mid back on 3/9, continued to bleed until pressure bandages placed by EMS the evening of 3/10  - Hgb is 10.0 in ED, down from 10.9 a month earlier  - Bleeding has stopped in ED with pressure bandage  - Type and screen has been performed   - Continue to hold Eliquis for now, resume Plavix with close monitoring, follow H&H   4. Chronic combined systolic & diastolic CHF  - Hypovolemic on admission and being hydrated with IVF  - Hold Entresto in light of AKI, hold diuretics while hydrating, follow daily wt and I/O's    5. Insulin-dependent DM  - A1c was 9.6% in February 2021  - Continue insulin    6. CAD  - No anginal complaints  - Continue Plavix, statin, and nitrates    7. Paroxysmal atrial fibrillation  - In sinus rhythm on admission  - Continue to hold Eliquis for now given significant bleeding, resume Plavix with close monitoring  - Continue amiodarone    DVT prophylaxis: Eliquis pta, held on admission  Code Status: Full  Family Communication: Discussed with patient  Disposition Plan: Likely back home in 2-3 days pending stabilization of BP, improvement in renal function, and no bleeding when anticoagulant resumed  Consults called: None  Admission status: Inpatient     Briscoe Deutscher, MD [facility] Pager: See [email or web address]  If 7AM-7PM, please contact the daytime attending [email or web address]  02/28/2020, 12:46 AM

## 2020-02-28 NOTE — Progress Notes (Signed)
PROGRESS NOTE    [name]  QQP:[id number] DOB: [birth year] DOA: 02/27/2020 PCP: Devra Dopp, MD     Brief Narrative:  [name] is a 74 y.o. male with medical history significant for coronary artery disease, chronic combined systolic and diastolic CHF, chronic kidney disease stage IIIb, insulin-dependent diabetes mellitus, chronic supplemental oxygen requirement, and obesity (BMI 47), presenting to the emergency department for evaluation of bleeding from his back where the keloid was removed, lightheadedness, and generalized weakness.  Patient reports that he had a keloid removed from his back on 02/26/2020, was unable to stop bleeding from the site at home, continued to bleed into the evening of 02/27/2020, developed near syncope and generalized weakness, and called EMS.  Patient was found to have systolic blood pressure of 78 with EMS, hemostasis was achieved with pressure dressings, and then 500 cc of saline was administered prior to arrival in the ED. Due to hypotension, patient received IV fluid.  New events last 24 hours / Subjective: States that he feels uncomfortable laying in his bed in the emergency department.  But no complaints of lightheadedness or dizziness.  Physically feeling better since getting IV fluid.  Assessment & Plan:   Principal Problem:   Acute renal failure superimposed on stage 3 chronic kidney disease (HCC) Active Problems:   Chronic combined systolic and diastolic CHF (congestive heart failure) (HCC)   Controlled type 2 diabetes mellitus with stage 3 chronic kidney disease (HCC)   Coronary artery disease involving coronary bypass graft of native heart without angina pectoris   Normocytic anemia   Bleeding   Hypotension   Acute kidney injury superimposed on CKD IIIb  -Baseline creatinine of 2.2 in 01/24/2020 -Creatinine improved from 3.46 --> 3.13 with IV fluid -Holding Entresto and diuretics due to hypotension and AKI.  Continue to monitor  BMP  Hypotension  -Secondary to volume depletion.  Improved, continue to monitor closely  Postprocedural bleeding -Reports significant bleeding after keloid removal from his mid back on 3/9, continued to bleed until pressure bandages placed by EMS the evening of 3/10  -Hold Eliquis for now  Chronic combined systolic & diastolic CHF  -BNP 67.6 -Without acute exacerbation at this point  Insulin-dependent DM, uncontrolled with hyperglycemia -Hemoglobin A1c 9.6 in 01/24/2020 -Continue Lantus, NovoLog sliding scale    CAD  -Continue Plavix, Lipitor -Imdur on hold  Paroxysmal atrial fibrillation  -Currently in normal sinus, continue amiodarone.  Eliquis on hold   DVT prophylaxis: Eliquis on hold due to anemia and hypotension.  SCDs Code Status: Full Family Communication: None at bedside  Disposition Plan:  . Patient is from home prior to admission. . Currently in-hospital treatment needed due to hypotension and bleeding. . Suspect patient will discharge home in 1-2 days.    Consultants:   None  Procedures:   None   Antimicrobials:  Anti-infectives (From admission, onward)   Start     Dose/Rate Route Frequency Ordered Stop   02/28/20 1000  doxycycline (VIBRA-TABS) tablet 100 mg     100 mg Oral 2 times daily 02/28/20 0522          Objective: Vitals:   02/28/20 0615 02/28/20 0645 02/28/20 0800 02/28/20 1000  BP: (!) 84/48 (!) 101/53 (!) 94/55 (!) 104/57  Pulse: (!) 58 65 61 (!) 59  Resp: 20  17 17   Temp:   98.5 F (36.9 C)   TempSrc:   Oral   SpO2: 93% 99% 99% 100%  Weight:  Height:        Intake/Output Summary (Last 24 hours) at 02/28/2020 1146 Last data filed at 02/28/2020 0852 Gross per 24 hour  Intake --  Output 490 ml  Net -490 ml   Filed Weights   02/27/20 1956  Weight: (!) 191 kg    Examination:  General exam: Appears calm and comfortable  Respiratory system: Clear to auscultation. Respiratory effort normal. No respiratory distress.  No conversational dyspnea.  Cardiovascular system: S1 & S2 heard, RRR. No murmurs. No pedal edema. Gastrointestinal system: Abdomen is nondistended, soft and nontender. Normal bowel sounds heard. Central nervous system: Alert and oriented. No focal neurological deficits. Speech clear.  Extremities: Symmetric in appearance  Skin: + Dressing in place in mid back where keloid was removed.  Without acute bleed noted Psychiatry: Judgement and insight appear normal. Mood & affect appropriate.   Data Reviewed: I have personally reviewed following labs and imaging studies  CBC: Recent Labs  Lab 02/27/20 2059 02/28/20 0715  WBC 12.8*  --   NEUTROABS 11.0*  --   HGB 10.0* 8.5*  HCT 35.9* 29.8*  MCV 80.0  --   PLT 243  --    Basic Metabolic Panel: Recent Labs  Lab 02/27/20 2059 02/28/20 0715  NA 137 138  K 5.1 5.2*  CL 99 102  CO2 26 27  GLUCOSE 170* 127*  BUN 36* 37*  CREATININE 3.46* 3.13*  CALCIUM 8.9 8.4*   GFR: Estimated Creatinine Clearance: 40 mL/min (A) (by C-G formula based on SCr of 3.13 mg/dL (H)). Liver Function Tests: Recent Labs  Lab 02/27/20 2059  AST 23  ALT 15  ALKPHOS 26*  BILITOT 0.4  PROT 6.4*  ALBUMIN 3.5   No results for input(s): LIPASE, AMYLASE in the last 168 hours. No results for input(s): AMMONIA in the last 168 hours. Coagulation Profile: No results for input(s): INR, PROTIME in the last 168 hours. Cardiac Enzymes: No results for input(s): CKTOTAL, CKMB, CKMBINDEX, TROPONINI in the last 168 hours. BNP (last 3 results) No results for input(s): PROBNP in the last 8760 hours. HbA1C: No results for input(s): HGBA1C in the last 72 hours. CBG: Recent Labs  Lab 02/27/20 2036 02/28/20 0801 02/28/20 1043  GLUCAP 154* 108* 121*   Lipid Profile: No results for input(s): CHOL, HDL, LDLCALC, TRIG, CHOLHDL, LDLDIRECT in the last 72 hours. Thyroid Function Tests: No results for input(s): TSH, T4TOTAL, FREET4, T3FREE, THYROIDAB in the last 72  hours. Anemia Panel: No results for input(s): VITAMINB12, FOLATE, FERRITIN, TIBC, IRON, RETICCTPCT in the last 72 hours. Sepsis Labs: Recent Labs  Lab 02/27/20 2059  LATICACIDVEN 2.9*    Recent Results (from the past 240 hour(s))  SARS CORONAVIRUS 2 (TAT 6-24 HRS) Nasopharyngeal Nasopharyngeal Swab     Status: None   Collection Time: 02/28/20  1:18 AM   Specimen: Nasopharyngeal Swab  Result Value Ref Range Status   SARS Coronavirus 2 NEGATIVE NEGATIVE Final    Comment: (NOTE) SARS-CoV-2 target nucleic acids are NOT DETECTED. The SARS-CoV-2 RNA is generally detectable in upper and lower respiratory specimens during the acute phase of infection. Negative results do not preclude SARS-CoV-2 infection, do not rule out co-infections with other pathogens, and should not be used as the sole basis for treatment or other patient management decisions. Negative results must be combined with clinical observations, patient history, and epidemiological information. The expected result is Negative. Fact Sheet for Patients: HairSlick.no Fact Sheet for Healthcare Providers: quierodirigir.com This test is not yet approved or  cleared by the [place] and  has been authorized for detection and/or diagnosis of SARS-CoV-2 by FDA under an Emergency Use Authorization (EUA). This EUA will remain  in effect (meaning this test can be used) for the duration of the COVID-19 declaration under Section 56 4(b)(1) of the Act, 21 U.S.C. section 360bbb-3(b)(1), unless the authorization is terminated or revoked sooner. Performed at [facility], [place] [address] [place], [place] [address]       Radiology Studies: DG Chest Port 1 View  Result Date: 02/27/2020 CLINICAL DATA:  Hypotensive EXAM: PORTABLE CHEST 1 VIEW COMPARISON:  09/21/2018 FINDINGS: Left-sided pacing device with poor visibility of the intracardiac leads. Post sternotomy changes.  Low lung volumes. Cardiomegaly without edema. Aortic atherosclerosis. No pneumothorax. IMPRESSION: No active disease.  Low lung volumes.  Stable cardiomegaly Electronically Signed   By: Donavan Foil M.D.   On: 02/27/2020 21:09      Scheduled Meds: . amiodarone  200 mg Oral Daily  . atorvastatin  80 mg Oral q1800  . clopidogrel  75 mg Oral Daily  . doxycycline  100 mg Oral BID  . insulin aspart  0-15 Units Subcutaneous TID WC  . insulin aspart  0-5 Units Subcutaneous QHS  . insulin glargine  30 Units Subcutaneous Daily  . isosorbide mononitrate  30 mg Oral Daily  . sodium chloride flush  3 mL Intravenous Q12H   Continuous Infusions: . sodium chloride       LOS: 1 day      Time spent: 40 minutes   Dessa Phi, DO [facility] 02/28/2020, 11:46 AM   Available via Epic secure chat 7am-7pm After these hours, please refer to coverage provider listed on [email or web address]

## 2020-02-28 NOTE — ED Notes (Signed)
Pt pulled inadvertently pulled out IV. Attempt to put another back in

## 2020-02-28 NOTE — ED Notes (Signed)
Checked on Pt, he stated that he was feeling fine and resting well

## 2020-02-28 NOTE — ED Notes (Signed)
Breakfast ordered 

## 2020-02-28 NOTE — ED Notes (Signed)
Pt given graham crackers & diet ginger ale.

## 2020-02-28 NOTE — ED Notes (Signed)
Having trouble with reading from BP. [name] BP 76/42. Dr. Antionette Char notified. 1 L NS hung per order

## 2020-02-29 LAB — CBC
HCT: 27.6 % — ABNORMAL LOW (ref 39.0–52.0)
Hemoglobin: 8 g/dL — ABNORMAL LOW (ref 13.0–17.0)
MCH: 22.9 pg — ABNORMAL LOW (ref 26.0–34.0)
MCHC: 29 g/dL — ABNORMAL LOW (ref 30.0–36.0)
MCV: 78.9 fL — ABNORMAL LOW (ref 80.0–100.0)
Platelets: 201 10*3/uL (ref 150–400)
RBC: 3.5 MIL/uL — ABNORMAL LOW (ref 4.22–5.81)
RDW: 19.8 % — ABNORMAL HIGH (ref 11.5–15.5)
WBC: 8.6 10*3/uL (ref 4.0–10.5)
nRBC: 0 % (ref 0.0–0.2)

## 2020-02-29 LAB — BASIC METABOLIC PANEL
Anion gap: 12 (ref 5–15)
BUN: 38 mg/dL — ABNORMAL HIGH (ref 8–23)
CO2: 24 mmol/L (ref 22–32)
Calcium: 8.4 mg/dL — ABNORMAL LOW (ref 8.9–10.3)
Chloride: 101 mmol/L (ref 98–111)
Creatinine, Ser: 2.86 mg/dL — ABNORMAL HIGH (ref 0.61–1.24)
GFR calc Af Amer: 24 mL/min — ABNORMAL LOW (ref >60)
GFR calc non Af Amer: 21 mL/min — ABNORMAL LOW (ref >60)
Glucose, Bld: 165 mg/dL — ABNORMAL HIGH (ref 70–99)
Potassium: 4.7 mmol/L (ref 3.5–5.1)
Sodium: 137 mmol/L (ref 135–145)

## 2020-02-29 LAB — GLUCOSE, CAPILLARY
Glucose-Capillary: 138 mg/dL — ABNORMAL HIGH (ref 70–99)
Glucose-Capillary: 171 mg/dL — ABNORMAL HIGH (ref 70–99)
Glucose-Capillary: 143 mg/dL — ABNORMAL HIGH (ref 70–99)
Glucose-Capillary: 194 mg/dL — ABNORMAL HIGH (ref 70–99)
Glucose-Capillary: 120 mg/dL — ABNORMAL HIGH (ref 70–99)

## 2020-02-29 LAB — UREA NITROGEN, URINE: Urea Nitrogen, Ur: 446 mg/dL

## 2020-02-29 MED ORDER — APIXABAN 5 MG PO TABS
5.0000 mg | ORAL_TABLET | Freq: Two times a day (BID) | ORAL | Status: DC
  Administered 2020-02-29 – 2020-03-01 (×3): 5 mg via ORAL
  Filled 2020-02-29 (×3): qty 1

## 2020-02-29 NOTE — Progress Notes (Signed)
Pt has a hx of CHF takes Torsemide at home, pt may need this here, pt was having some SOB with IV fluids, pt's BP is getting better and his kidney function is improving slightly, will continue to monitor, Thanks Lavonda Jumbo RN.

## 2020-02-29 NOTE — Evaluation (Signed)
Physical Therapy Evaluation Patient Details Name: [name] MRN: [id number] DOB: [birth year] Today's Date: 02/29/2020   History of Present Illness  74 y.o. male with medical history significant for coronary artery disease, chronic combined systolic and diastolic CHF, chronic kidney disease stage IIIb, insulin-dependent diabetes mellitus, chronic supplemental oxygen requirement, and obesity (BMI 50.7), presenting to the emergency department on 3/10 for evaluation of bleeding from his back where the keloid was removed, lightheadedness, and generalized weakness.  Clinical Impression   Pt presents with generalized weakness, difficulty mobilizing OOB, unsteadiness in standing, high fall risk based on PT eval presentation, dyspnea on exertion, and decreased activity tolerance. Pt to benefit from acute PT to address deficits. Pt ambulated 3 ft in room with HHA from PT, required between min and max assist +2 for mobility this session. Per pt's wife, their son assists him with mobility most times and she assists with bathing/dressing. At baseline, pt is able to climb 4+3 steps to enter home, walk short household distances. PT feels he is not at baseline, PT initially recommending SNF level of care to family and pt but refuse. As an alternative, PT recommending HHPT, HH aide (OT to assess pt needs on eval). Pt's wife states they only want an aide if it is covered by insurance, otherwise she will continue to do it herself. PT to progress mobility as tolerated, and will continue to follow acutely.   Of note, pt complaining of mild chest pain post-OOB mobility, NT notified who called for RN. RN secure chatted.    Follow Up Recommendations Other (comment);Home health PT;Supervision/Assistance - 24 hour(pt and family decline SNF, HHPT, HH aide)    Equipment Recommendations  None recommended by PT    Recommendations for Other Services       Precautions / Restrictions Precautions Precautions:  Fall Restrictions Weight Bearing Restrictions: No      Mobility  Bed Mobility Overal bed mobility: Needs Assistance Bed Mobility: Rolling;Sidelying to Sit;Sit to Supine Rolling: Min assist Sidelying to sit: Mod assist;HOB elevated   Sit to supine: Max assist;+2 for safety/equipment;HOB elevated;+2 for physical assistance   General bed mobility comments: Min assist for rolling to R for LE translation. Mod-max assist supine<>sit for trunk and LE management, scooting up in bed. Pt assists with repositioning in bed with use of bedrails.  Transfers Overall transfer level: Needs assistance Equipment used: 2 person hand held assist Transfers: Sit to/from Stand Sit to Stand: Max assist;From elevated surface;+2 safety/equipment;+2 physical assistance         General transfer comment: Max assist for power up, steadying, coming to full standing as pt with multiple small power ups without successful stand. Sit to stand x2, in preparation for ambulation and for placement of new bed pads.  Ambulation/Gait Ambulation/Gait assistance: Min assist;+2 safety/equipment Gait Distance (Feet): 3 Feet Assistive device: Rolling walker (2 wheeled) Gait Pattern/deviations: Step-through pattern;Decreased stride length Gait velocity: decr   General Gait Details: slow gait, requiring min assist for steadying. Very limited tolerance, pt ambulated backwards x3 ft to get back to bed with quick, uncontrolled descent onto EOB.  Stairs            Wheelchair Mobility    Modified Rankin (Stroke Patients Only)       Balance Overall balance assessment: Needs assistance Sitting-balance support: Bilateral upper extremity supported;Feet supported Sitting balance-Leahy Scale: Fair Sitting balance - Comments: maintains EOB sitting x1 minute, then requires propping on R elbow due to fatigue.   Standing balance support: Bilateral upper  extremity supported;During functional activity Standing balance-Leahy  Scale: Poor Standing balance comment: requires HHA for steadying                             Pertinent Vitals/Pain Pain Assessment: Faces Faces Pain Scale: Hurts a little bit Pain Location: chest, post-transfer Pain Descriptors / Indicators: Discomfort Pain Intervention(s): Limited activity within patient's tolerance;Monitored during session;Repositioned    Home Living Family/patient expects to be discharged to:: Private residence Living Arrangements: Spouse/significant other Available Help at Discharge: Family;Available 24 hours/day Type of Home: House Home Access: Stairs to enter Entrance Stairs-Rails: Left Entrance Stairs-Number of Steps: 4 + 3 Home Layout: Able to live on main level with bedroom/bathroom Home Equipment: Walker - 2 wheels;Walker - 4 wheels;Cane - single point;Bedside commode;Wheelchair - manual      Prior Function Level of Independence: Needs assistance   Gait / Transfers Assistance Needed: pt uses wheelchair for community mobility, does not propel self. Pt ambulates short distances in house with either cane or by using furniture in environment to steady self.  ADL's / Homemaking Assistance Needed: Pt reports needing assist from wife for sponge bathing, LE dressing, but does not require toileting assist.        Hand Dominance   Dominant Hand: Right    Extremity/Trunk Assessment   Upper Extremity Assessment Upper Extremity Assessment: Defer to OT evaluation    Lower Extremity Assessment Lower Extremity Assessment: Generalized weakness    Cervical / Trunk Assessment Cervical / Trunk Assessment: (forward head, rounded shoulders)  Communication   Communication: HOH  Cognition Arousal/Alertness: Awake/alert Behavior During Therapy: WFL for tasks assessed/performed Overall Cognitive Status: Within Functional Limits for tasks assessed                                        General Comments      Exercises      Assessment/Plan    PT Assessment Patient needs continued PT services  PT Problem List Decreased strength;Decreased mobility;Decreased safety awareness;Decreased activity tolerance;Decreased balance;Decreased knowledge of use of DME;Pain;Cardiopulmonary status limiting activity;Obesity       PT Treatment Interventions DME instruction;Therapeutic activities;Gait training;Therapeutic exercise;Patient/family education;Balance training;Stair training;Functional mobility training;Neuromuscular re-education    PT Goals (Current goals can be found in the Care Plan section)  Acute Rehab PT Goals Patient Stated Goal: go home PT Goal Formulation: With patient Time For Goal Achievement: 03/14/20 Potential to Achieve Goals: Good    Frequency Min 3X/week   Barriers to discharge        Co-evaluation               AM-PAC PT "6 Clicks" Mobility  Outcome Measure Help needed turning from your back to your side while in a flat bed without using bedrails?: A Little Help needed moving from lying on your back to sitting on the side of a flat bed without using bedrails?: Total Help needed moving to and from a bed to a chair (including a wheelchair)?: Total Help needed standing up from a chair using your arms (e.g., wheelchair or bedside chair)?: Total Help needed to walk in hospital room?: A Lot Help needed climbing 3-5 steps with a railing? : Total 6 Click Score: 9    End of Session Equipment Utilized During Treatment: Oxygen(on 3L chronic O2) Activity Tolerance: Patient limited by fatigue Patient left: in bed;with call bell/phone within  reach;with family/visitor present;with nursing/sitter in room Nurse Communication: Mobility status PT Visit Diagnosis: Other abnormalities of gait and mobility (R26.89);Muscle weakness (generalized) (M62.81);Difficulty in walking, not elsewhere classified (R26.2)    Time: 8257-4935 PT Time Calculation (min) (ACUTE ONLY): 27 min   Charges:   PT  Evaluation $PT Eval Low Complexity: 1 Low PT Treatments $Therapeutic Activity: 8-22 mins        Jaquay Posthumus E, PT Acute Rehabilitation Services Pager [phone]  Office [phone]   Barak Bialecki D Despina Hidden 02/29/2020, 4:28 PM

## 2020-02-29 NOTE — Plan of Care (Signed)
  Problem: Pain Managment: Goal: General experience of comfort will improve Outcome: Completed/Met

## 2020-02-29 NOTE — Consult Note (Addendum)
WOC Nurse Consult Note: Reason for Consult: Requested to assess back wound; pt previously had a keloid area removed on 3/9 to this location and developed significant bleeding prior to admission, according to progress notes. Pt states wife was performing dressing changes prior to admission but is unable to state what topical treatment plan was ordered.  Wound type: Moistened previous dressing to remove. No further bleeding at this time.  Full thickness post-op wound 4X1X.8cm Wound bed: red and moist Drainage (amount, consistency, odor) mod amt tan drainage, no odor Periwound: intact skin surrounding Dressing procedure/placement/frequency: Topical treatment orders provided for bedside nurses to perform daily as follows: Apply moist gauze packing to back wound Q day, using swab to fill, then cover with gauze and tape.  Moisten previous dressing with NS to assist with removal. Pt should return to the surgeon after discharge for further assessment and plan of care.  Please re-consult if further assistance is needed.  Thank-you,  Cammie Mcgee MSN, RN, CWOCN, [place], CNS [phone]

## 2020-02-29 NOTE — Progress Notes (Signed)
PROGRESS NOTE    [name]  UTM:[id number] DOB: 17-Oct-1946 DOA: 02/27/2020 PCP: Devra Dopp, MD     Brief Narrative:  [name] is a 74 y.o. male with medical history significant for coronary artery disease, chronic combined systolic and diastolic CHF, chronic kidney disease stage IIIb, insulin-dependent diabetes mellitus, chronic supplemental oxygen requirement, and obesity (BMI 47), presenting to the emergency department for evaluation of bleeding from his back where the keloid was removed, lightheadedness, and generalized weakness.  Patient reports that he had a keloid removed from his back on 02/26/2020, was unable to stop bleeding from the site at home, continued to bleed into the evening of 02/27/2020, developed near syncope and generalized weakness, and called EMS.  Patient was found to have systolic blood pressure of 78 with EMS, hemostasis was achieved with pressure dressings, and then 500 cc of saline was administered prior to arrival in the ED. Due to hypotension, patient received IV fluid.  New events last 24 hours / Subjective: States that he feels much better, wants to go home.  Has no complaints today including denies chest pain or shortness of breath.  Assessment & Plan:   Principal Problem:   Acute renal failure superimposed on stage 3 chronic kidney disease (HCC) Active Problems:   Chronic combined systolic and diastolic CHF (congestive heart failure) (HCC)   Controlled type 2 diabetes mellitus with stage 3 chronic kidney disease (HCC)   Coronary artery disease involving coronary bypass graft of native heart without angina pectoris   Normocytic anemia   Bleeding   Hypotension   Acute kidney injury superimposed on CKD IIIb  -Baseline creatinine of 2.2 in 01/24/2020 -Creatinine improved from 3.46 --> 3.13 --> 2.86 with IV fluid -Holding Entresto and diuretics due to hypotension and AKI.  Continue to monitor BMP  Hypotension  -Secondary to volume  depletion.  Improved, continue to monitor closely  Postprocedural bleeding -Reports significant bleeding after keloid removal from his mid back on 3/9, continued to bleed until pressure bandages placed by EMS the evening of 3/10  -Resume Eliquis -Monitor CBC  -Doxycycline post procedure after keloid removal  Chronic combined systolic & diastolic CHF  -BNP 67.6 -Without acute exacerbation at this point  Insulin-dependent DM, uncontrolled with hyperglycemia -Hemoglobin A1c 9.6 in 01/24/2020 -Continue Lantus, NovoLog sliding scale    CAD  -Continue Plavix, Lipitor, Imdur  Paroxysmal atrial fibrillation  -Currently in normal sinus, continue amiodarone, Eliquis  Chronic hypoxemic respiratory failure -Patient on 2.5-3L nasal cannula O2 at baseline -Likely component of OSA/OHS.  Patient declines further evaluation with sleep study   DVT prophylaxis: Eliquis Code Status: Full Family Communication: None at bedside  Disposition Plan:  . Patient is from home prior to admission. . Currently in-hospital treatment needed due to hypotension and AKI . Suspect patient will discharge home tomorrow if creatinine improves and blood pressure stable   Consultants:   None  Procedures:   None   Antimicrobials:  Anti-infectives (From admission, onward)   Start     Dose/Rate Route Frequency Ordered Stop   02/28/20 1000  doxycycline (VIBRA-TABS) tablet 100 mg     100 mg Oral 2 times daily 02/28/20 0522         Objective: Vitals:   02/29/20 0020 02/29/20 0317 02/29/20 0623 02/29/20 0814  BP: (!) 100/48 (!) 98/50 (!) 110/50 (!) 97/56  Pulse: 62 65 60 60  Resp: 20 20  20   Temp: 98.4 F (36.9 C)  98.8 F (37.1 C) 98.7 F (  37.1 C)  TempSrc: Oral  Oral Oral  SpO2: 97% 97%  100%  Weight:  (!) 204.1 kg    Height:        Intake/Output Summary (Last 24 hours) at 02/29/2020 1042 Last data filed at 02/29/2020 0817 Gross per 24 hour  Intake 923.21 ml  Output 1025 ml  Net -101.79 ml    Filed Weights   02/27/20 1956 02/28/20 1541 02/29/20 0317  Weight: (!) 191 kg (!) 205.9 kg (!) 204.1 kg    Examination: General exam: Appears calm and comfortable  Respiratory system: Clear to auscultation. Respiratory effort normal.  On nasal cannula O2 Cardiovascular system: S1 & S2 heard, RRR. No pedal edema. Gastrointestinal system: Abdomen is nondistended, soft and nontender. Normal bowel sounds heard. Central nervous system: Alert and oriented. Non focal exam. Speech clear  Extremities: Symmetric in appearance bilaterally  Psychiatry: Judgement and insight appear stable. Mood & affect appropriate.    Data Reviewed: I have personally reviewed following labs and imaging studies  CBC: Recent Labs  Lab 02/27/20 2059 02/28/20 0715 02/29/20 0501  WBC 12.8*  --  8.6  NEUTROABS 11.0*  --   --   HGB 10.0* 8.5* 8.0*  HCT 35.9* 29.8* 27.6*  MCV 80.0  --  78.9*  PLT 243  --  350   Basic Metabolic Panel: Recent Labs  Lab 02/27/20 2059 02/28/20 0715 02/29/20 0501  NA 137 138 137  K 5.1 5.2* 4.7  CL 99 102 101  CO2 26 27 24   GLUCOSE 170* 127* 165*  BUN 36* 37* 38*  CREATININE 3.46* 3.13* 2.86*  CALCIUM 8.9 8.4* 8.4*   GFR: Estimated Creatinine Clearance: 45.5 mL/min (A) (by C-G formula based on SCr of 2.86 mg/dL (H)). Liver Function Tests: Recent Labs  Lab 02/27/20 2059  AST 23  ALT 15  ALKPHOS 26*  BILITOT 0.4  PROT 6.4*  ALBUMIN 3.5   No results for input(s): LIPASE, AMYLASE in the last 168 hours. No results for input(s): AMMONIA in the last 168 hours. Coagulation Profile: No results for input(s): INR, PROTIME in the last 168 hours. Cardiac Enzymes: No results for input(s): CKTOTAL, CKMB, CKMBINDEX, TROPONINI in the last 168 hours. BNP (last 3 results) No results for input(s): PROBNP in the last 8760 hours. HbA1C: No results for input(s): HGBA1C in the last 72 hours. CBG: Recent Labs  Lab 02/28/20 1157 02/28/20 1558 02/28/20 2145 02/29/20 0622  02/29/20 1010  GLUCAP 109* 136* 130* 138* 171*   Lipid Profile: No results for input(s): CHOL, HDL, LDLCALC, TRIG, CHOLHDL, LDLDIRECT in the last 72 hours. Thyroid Function Tests: No results for input(s): TSH, T4TOTAL, FREET4, T3FREE, THYROIDAB in the last 72 hours. Anemia Panel: No results for input(s): VITAMINB12, FOLATE, FERRITIN, TIBC, IRON, RETICCTPCT in the last 72 hours. Sepsis Labs: Recent Labs  Lab 02/27/20 2059  LATICACIDVEN 2.9*    Recent Results (from the past 240 hour(s))  SARS CORONAVIRUS 2 (TAT 6-24 HRS) Nasopharyngeal Nasopharyngeal Swab     Status: None   Collection Time: 02/28/20  1:18 AM   Specimen: Nasopharyngeal Swab  Result Value Ref Range Status   SARS Coronavirus 2 NEGATIVE NEGATIVE Final    Comment: (NOTE) SARS-CoV-2 target nucleic acids are NOT DETECTED. The SARS-CoV-2 RNA is generally detectable in upper and lower respiratory specimens during the acute phase of infection. Negative results do not preclude SARS-CoV-2 infection, do not rule out co-infections with other pathogens, and should not be used as the sole basis for treatment or other  patient management decisions. Negative results must be combined with clinical observations, patient history, and epidemiological information. The expected result is Negative. Fact Sheet for Patients: HairSlick.no Fact Sheet for Healthcare Providers: quierodirigir.com This test is not yet approved or cleared by the [place] FDA and  has been authorized for detection and/or diagnosis of SARS-CoV-2 by FDA under an Emergency Use Authorization (EUA). This EUA will remain  in effect (meaning this test can be used) for the duration of the COVID-19 declaration under Section 56 4(b)(1) of the Act, 21 U.S.C. section 360bbb-3(b)(1), unless the authorization is terminated or revoked sooner. Performed at [facility], [address], [place],  [place] [address]       Radiology Studies: DG CHEST PORT 1 VIEW  Result Date: 02/28/2020 CLINICAL DATA:  74 year old male with shortness of breath. EXAM: PORTABLE CHEST 1 VIEW COMPARISON:  Portable chest 02/27/2020 and earlier. FINDINGS: Portable AP semi upright view at 2135 hours. Continued low lung volumes. Stable cardiac size and mediastinal contours. Stable left chest AICD. Ventilation has not significantly changed, although there is mild new thickening or trace fluid along the minor fissure. No pneumothorax or consolidation. Visualized tracheal air column is within normal limits. IMPRESSION: Continued low lung volumes with stable ventilation. Possible trace pleural fluid in the right minor fissure. Electronically Signed   By: Odessa Fleming M.D.   On: 02/28/2020 21:55   DG Chest Port 1 View  Result Date: 02/27/2020 CLINICAL DATA:  Hypotensive EXAM: PORTABLE CHEST 1 VIEW COMPARISON:  09/21/2018 FINDINGS: Left-sided pacing device with poor visibility of the intracardiac leads. Post sternotomy changes. Low lung volumes. Cardiomegaly without edema. Aortic atherosclerosis. No pneumothorax. IMPRESSION: No active disease.  Low lung volumes.  Stable cardiomegaly Electronically Signed   By: Jasmine Pang M.D.   On: 02/27/2020 21:09      Scheduled Meds: . amiodarone  200 mg Oral Daily  . atorvastatin  80 mg Oral q1800  . clopidogrel  75 mg Oral Daily  . doxycycline  100 mg Oral BID  . insulin aspart  0-15 Units Subcutaneous TID WC  . insulin aspart  0-5 Units Subcutaneous QHS  . insulin glargine  30 Units Subcutaneous Daily  . isosorbide mononitrate  30 mg Oral Daily  . sodium chloride flush  3 mL Intravenous Q12H   Continuous Infusions:    LOS: 2 days      Time spent: 25 minutes   Noralee Stain, DO [facility] 02/29/2020, 10:42 AM   Available via Epic secure chat 7am-7pm After these hours, please refer to coverage provider listed on [email or web address]

## 2020-03-01 LAB — BASIC METABOLIC PANEL
Anion gap: 11 (ref 5–15)
BUN: 31 mg/dL — ABNORMAL HIGH (ref 8–23)
CO2: 26 mmol/L (ref 22–32)
Calcium: 8.7 mg/dL — ABNORMAL LOW (ref 8.9–10.3)
Chloride: 102 mmol/L (ref 98–111)
Creatinine, Ser: 2.52 mg/dL — ABNORMAL HIGH (ref 0.61–1.24)
GFR calc Af Amer: 28 mL/min — ABNORMAL LOW (ref >60)
GFR calc non Af Amer: 24 mL/min — ABNORMAL LOW (ref >60)
Glucose, Bld: 126 mg/dL — ABNORMAL HIGH (ref 70–99)
Potassium: 4.5 mmol/L (ref 3.5–5.1)
Sodium: 139 mmol/L (ref 135–145)

## 2020-03-01 LAB — CBC
HCT: 28.6 % — ABNORMAL LOW (ref 39.0–52.0)
Hemoglobin: 8.1 g/dL — ABNORMAL LOW (ref 13.0–17.0)
MCH: 22.9 pg — ABNORMAL LOW (ref 26.0–34.0)
MCHC: 28.3 g/dL — ABNORMAL LOW (ref 30.0–36.0)
MCV: 81 fL (ref 80.0–100.0)
Platelets: 212 10*3/uL (ref 150–400)
RBC: 3.53 MIL/uL — ABNORMAL LOW (ref 4.22–5.81)
RDW: 20 % — ABNORMAL HIGH (ref 11.5–15.5)
WBC: 8.6 10*3/uL (ref 4.0–10.5)
nRBC: 0 % (ref 0.0–0.2)

## 2020-03-01 LAB — GLUCOSE, CAPILLARY: Glucose-Capillary: 127 mg/dL — ABNORMAL HIGH (ref 70–99)

## 2020-03-01 NOTE — Progress Notes (Signed)
Physical Therapy Treatment Patient Details Name: [name] MRN: [id number] DOB: [birth year] Today's Date: 03/01/2020    History of Present Illness 74 y.o. male with medical history significant for coronary artery disease, chronic combined systolic and diastolic CHF, chronic kidney disease stage IIIb, insulin-dependent diabetes mellitus, chronic supplemental oxygen requirement, and obesity (BMI 50.7), presenting to the emergency department on 3/10 for evaluation of bleeding from his back where the keloid was removed, lightheadedness, and generalized weakness.    PT Comments    Pt did well with mobility this am, he was in great spirits and able to complete much more than previous session and also with less assistance.     Follow Up Recommendations  Other (comment);Home health PT;Supervision/Assistance - 24 hour     Equipment Recommendations  None recommended by PT    Recommendations for Other Services       Precautions / Restrictions Precautions Precautions: Fall Restrictions Weight Bearing Restrictions: No    Mobility  Bed Mobility Overal bed mobility: Needs Assistance Bed Mobility: Supine to Sit;Sit to Supine     Supine to sit: Min guard;Min assist Sit to supine: Min assist   General bed mobility comments: pt seeking HHA to come upright but able to perform without additional assist, light assist to ensure LEs onto bed when returning to supine  Transfers Overall transfer level: Needs assistance Equipment used: Rolling walker (2 wheeled) Transfers: Sit to/from Stand Sit to Stand: Min assist;Min guard         General transfer comment: light boosting assist from EOB to RW, increased time/effort  Ambulation/Gait Ambulation/Gait assistance: Min assist;+2 safety/equipment Gait Distance (Feet): 36 Feet Assistive device: Rolling walker (2 wheeled) Gait Pattern/deviations: Step-through pattern;Decreased stride length Gait velocity: decr       Stairs              Wheelchair Mobility    Modified Rankin (Stroke Patients Only)       Balance Overall balance assessment: Needs assistance Sitting-balance support: Bilateral upper extremity supported;Feet supported Sitting balance-Leahy Scale: Good     Standing balance support: Bilateral upper extremity supported;Single extremity supported Standing balance-Leahy Scale: Fair Standing balance comment: reliant on UE support for mobility                            Cognition Arousal/Alertness: Awake/alert Behavior During Therapy: WFL for tasks assessed/performed Overall Cognitive Status: Within Functional Limits for tasks assessed                                        Exercises      General Comments        Pertinent Vitals/Pain Pain Assessment: No/denies pain    Home Living Family/patient expects to be discharged to:: Private residence Living Arrangements: Spouse/significant other Available Help at Discharge: Family;Available 24 hours/day Type of Home: House Home Access: Stairs to enter Entrance Stairs-Rails: Left Home Layout: Able to live on main level with bedroom/bathroom Home Equipment: Walker - 2 wheels;Walker - 4 wheels;Cane - single point;Bedside commode;Wheelchair - manual      Prior Function Level of Independence: Needs assistance  Gait / Transfers Assistance Needed: pt uses wheelchair for community mobility, does not propel self. Pt ambulates short distances in house with either cane or by using furniture in environment to steady self. ADL's / Homemaking Assistance Needed: Pt reports needing assist from wife  for sponge bathing, LE dressing, but does not require toileting assist.     PT Goals (current goals can now be found in the care plan section) Acute Rehab PT Goals Patient Stated Goal: go home PT Goal Formulation: With patient Time For Goal Achievement: 03/14/20 Potential to Achieve Goals: Good    Frequency    Min  3X/week      PT Plan Current plan remains appropriate    Co-evaluation PT/OT/SLP Co-Evaluation/Treatment: Yes Reason for Co-Treatment: For patient/therapist safety;To address functional/ADL transfers PT goals addressed during session: Mobility/safety with mobility;Balance;Proper use of DME;Strengthening/ROM OT goals addressed during session: ADL's and self-care      AM-PAC PT "6 Clicks" Mobility   Outcome Measure  Help needed turning from your back to your side while in a flat bed without using bedrails?: A Little Help needed moving from lying on your back to sitting on the side of a flat bed without using bedrails?: A Little Help needed moving to and from a bed to a chair (including a wheelchair)?: A Little Help needed standing up from a chair using your arms (e.g., wheelchair or bedside chair)?: A Little Help needed to walk in hospital room?: A Little Help needed climbing 3-5 steps with a railing? : A Lot 6 Click Score: 17    End of Session Equipment Utilized During Treatment: Oxygen Activity Tolerance: Patient limited by fatigue Patient left: in bed;with call bell/phone within reach Nurse Communication: Mobility status PT Visit Diagnosis: Other abnormalities of gait and mobility (R26.89);Muscle weakness (generalized) (M62.81);Difficulty in walking, not elsewhere classified (R26.2)     Time: 0940-1000 PT Time Calculation (min) (ACUTE ONLY): 20 min  Charges:  $Gait Training: 8-22 mins                     Drema Pry, PT    Freddi Starr 03/01/2020, 12:43 PM

## 2020-03-01 NOTE — Discharge Summary (Signed)
Physician Discharge Summary  NASEEM VARDEN LFY:[id number] DOB: [birth year] DOA: 02/27/2020  PCP: Devra Dopp, MD  Admit date: 02/27/2020 Discharge date: 03/01/2020  Admitted From: Home Disposition:  Home, declined home health services   Recommendations for Outpatient Follow-up:  1. Follow up with PCP in 1 week 2. Follow up with dermatology as scheduled for keloid removal follow up  3. Repeat CBC, BMP as outpatient in 1 week   Discharge Condition: Stable CODE STATUS: Full  Diet recommendation: Heart healthy/carb modified   Brief/Interim Summary: Ernestene Kiel Norwoodis a 74 y.o.malewith medical history significant forcoronary artery disease, chronic combined systolic and diastolic CHF, chronic kidney disease stage IIIb, insulin-dependent diabetes mellitus, chronic supplemental oxygen requirement, and obesity (BMI 47), presenting to the emergency department for evaluation of bleeding from his back where the keloid was removed, lightheadedness, and generalized weakness. Patient reports that he had a keloid removed from his back on 02/26/2020, was unable to stop bleeding from the site at home, continued to bleed into the evening of 02/27/2020, developed near syncope and generalized weakness, and called EMS. Patient was found to have systolic blood pressure of 78 with EMS, hemostasis was achieved with pressure dressings, and then 500 cc of saline was administered prior to arrival in the ED. Due to hypotension, patient received IV fluid. Patient's AKI and BP continued to improve. On day of discharge, patient was feeling well without complaints of dizziness, lightheadedness, chest pain, shortness of breath, or swelling. Bleeding of his wound had stopped. He felt ready to go home and all questions/concerns were addressed.   Discharge Diagnoses:  Principal Problem:   Acute renal failure superimposed on stage 3 chronic kidney disease (HCC) Active Problems:   Chronic combined systolic and diastolic  CHF (congestive heart failure) (HCC)   Controlled type 2 diabetes mellitus with stage 3 chronic kidney disease (HCC)   Coronary artery disease involving coronary bypass graft of native heart without angina pectoris   Normocytic anemia   Bleeding   Hypotension   Acute kidney injury superimposed on CKD IIIb -Baseline creatinine of 2.2 in 01/24/2020 -Creatinine improved from 3.46 --> 3.13 --> 2.86 --> 2.52 with IV fluid -Resume Entresto and diuretics as outpatient   Hypotension -Secondary to volume depletion. Resolved   Postprocedural bleeding -Reports significant bleeding after keloid removal from his mid back on 3/9, continued to bleed until pressure bandages placed by EMS the evening of 3/10 -Resume Eliquis -Monitor CBC, stable Hgb 8.1  -Doxycycline post procedure after keloid removal  Chronic combined systolic & diastolic CHF -BNP 85.2 -Without acute exacerbation at this point  Insulin-dependent DM, uncontrolled with hyperglycemia -Hemoglobin A1c 9.6 in 01/24/2020 -Continue Lantus, NovoLog sliding scale  CAD -Continue Plavix, Lipitor, Imdur  Paroxysmal atrial fibrillation -Currently in normal sinus, continue amiodarone, Eliquis  Chronic hypoxemic respiratory failure -Patient on 2.5-3L nasal cannula O2 at baseline -Likely component of OSA/OHS.  Patient declines further evaluation with sleep study   Discharge Instructions  Discharge Instructions    (HEART FAILURE PATIENTS) Call MD:  Anytime you have any of the following symptoms: 1) 3 pound weight gain in 24 hours or 5 pounds in 1 week 2) shortness of breath, with or without a dry hacking cough 3) swelling in the hands, feet or stomach 4) if you have to sleep on extra pillows at night in order to breathe.   Complete by: As directed    Call MD for:  difficulty breathing, headache or visual disturbances   Complete by: As directed  Call MD for:  extreme fatigue   Complete by: As directed    Call MD for:   persistant dizziness or light-headedness   Complete by: As directed    Call MD for:  persistant nausea and vomiting   Complete by: As directed    Call MD for:  severe uncontrolled pain   Complete by: As directed    Call MD for:  temperature >100.4   Complete by: As directed    Diet - low sodium heart healthy   Complete by: As directed    Diet Carb Modified   Complete by: As directed    Discharge instructions   Complete by: As directed    You were cared for by a hospitalist during your hospital stay. If you have any questions about your discharge medications or the care you received while you were in the hospital after you are discharged, you can call the unit and ask to speak with the hospitalist on call if the hospitalist that took care of you is not available. Once you are discharged, your primary care physician will handle any further medical issues. Please note that NO REFILLS for any discharge medications will be authorized once you are discharged, as it is imperative that you return to your primary care physician (or establish a relationship with a primary care physician if you do not have one) for your aftercare needs so that they can reassess your need for medications and monitor your lab values.   Increase activity slowly   Complete by: As directed      Allergies as of 03/01/2020      Reactions   Coreg [carvedilol] Nausea And Vomiting      Medication List    STOP taking these medications   buPROPion 150 MG 24 hr tablet Commonly known as: Wellbutrin XL   docusate sodium 100 MG capsule Commonly known as: Colace   sertraline 50 MG tablet Commonly known as: ZOLOFT     TAKE these medications   acetaminophen 325 MG tablet Commonly known as: TYLENOL Take 1-2 tablets (325-650 mg total) by mouth every 4 (four) hours as needed for mild pain.   amiodarone 200 MG tablet Commonly known as: PACERONE Take 1 tablet (200 mg total) by mouth daily.   apixaban 5 MG Tabs  tablet Commonly known as: Eliquis Take 1 tablet (5 mg total) by mouth 2 (two) times daily.   atorvastatin 80 MG tablet Commonly known as: LIPITOR Take 1 tablet (80 mg total) by mouth daily.   cholecalciferol 1000 units tablet Commonly known as: VITAMIN D Take 1,000 Units by mouth every other day.   clopidogrel 75 MG tablet Commonly known as: PLAVIX Take 75 mg by mouth daily.   doxycycline 100 MG tablet Commonly known as: VIBRA-TABS Take 100 mg by mouth 2 (two) times daily.   Entresto 49-51 MG Generic drug: sacubitril-valsartan TAKE 1 TABLET BY MOUTH TWICE DAILY   ezetimibe 10 MG tablet Commonly known as: ZETIA Take 1 tablet (10 mg total) by mouth daily.   ferrous sulfate 325 (65 FE) MG EC tablet Take 1 tablet (325 mg total) by mouth 2 (two) times daily.   Fish Oil 1200 MG Caps Take 1,200 mg by mouth daily.   HumaLOG KwikPen 100 UNIT/ML KiwkPen Generic drug: insulin lispro Inject 20 Units into the skin every morning.   insulin glargine 100 UNIT/ML injection Commonly known as: Lantus Inject 0.5 mLs (50 Units total) into the skin every morning.   Invokana 100 MG  Tabs tablet Generic drug: canagliflozin Take 100 mg by mouth daily.   isosorbide mononitrate 30 MG 24 hr tablet Commonly known as: IMDUR TAKE 1 TABLET(30 MG) BY MOUTH DAILY   metoprolol succinate 50 MG 24 hr tablet Commonly known as: TOPROL-XL TAKE 1 TABLET(50 MG) BY MOUTH DAILY What changed:   how much to take  how to take this  when to take this  additional instructions   nitroGLYCERIN 0.4 MG SL tablet Commonly known as: NITROSTAT PLACE 1 TABLET UNDER THE TONGUE AS NEEDED EVERY 5 MINUTES FOR CHEST PAIN What changed:   how much to take  how to take this  when to take this  reasons to take this   OrthoVisc 30 MG/2ML Sosy Generic drug: Hyaluronan Inject 30 mg into the articular space daily. Every six months. Last dose was in August 2019   potassium chloride SA 20 MEQ tablet Commonly  known as: KLOR-CON TAKE 1 TABLET(20 MEQ) BY MOUTH TWICE DAILY   spironolactone 25 MG tablet Commonly known as: ALDACTONE Take 0.5 tablets (12.5 mg total) by mouth daily.   torsemide 20 MG tablet Commonly known as: DEMADEX Take 40 mg in the morning and 20 mg in the evening. When your weight is 420 pounds and over, take 40 mg twice daily.   Victoza 18 MG/3ML Sopn Generic drug: liraglutide Inject 1.2 mg into the skin daily.      Follow-up Information    Devra Dopp, MD. Schedule an appointment as soon as possible for a visit in 1 week(s).   Specialty: Family Medicine Contact information: [address] [place] [address] [phone]          Allergies  Allergen Reactions  . Coreg [Carvedilol] Nausea And Vomiting     Procedures/Studies: DG CHEST PORT 1 VIEW  Result Date: 02/28/2020 CLINICAL DATA:  74 year old male with shortness of breath. EXAM: PORTABLE CHEST 1 VIEW COMPARISON:  Portable chest 02/27/2020 and earlier. FINDINGS: Portable AP semi upright view at 2135 hours. Continued low lung volumes. Stable cardiac size and mediastinal contours. Stable left chest AICD. Ventilation has not significantly changed, although there is mild new thickening or trace fluid along the minor fissure. No pneumothorax or consolidation. Visualized tracheal air column is within normal limits. IMPRESSION: Continued low lung volumes with stable ventilation. Possible trace pleural fluid in the right minor fissure. Electronically Signed   By: Odessa Fleming M.D.   On: 02/28/2020 21:55   DG Chest Port 1 View  Result Date: 02/27/2020 CLINICAL DATA:  Hypotensive EXAM: PORTABLE CHEST 1 VIEW COMPARISON:  09/21/2018 FINDINGS: Left-sided pacing device with poor visibility of the intracardiac leads. Post sternotomy changes. Low lung volumes. Cardiomegaly without edema. Aortic atherosclerosis. No pneumothorax. IMPRESSION: No active disease.  Low lung volumes.  Stable cardiomegaly Electronically  Signed   By: Jasmine Pang M.D.   On: 02/27/2020 21:09       Discharge Exam: Vitals:   03/01/20 0608 03/01/20 0756  BP: (!) 113/58 135/62  Pulse: 60 62  Resp: 18 20  Temp: 98.9 F (37.2 C) 97.9 F (36.6 C)  SpO2: 100% 99%     General: Pt is alert, awake, not in acute distress Cardiovascular: RRR, S1/S2 +, no edema Respiratory: CTA bilaterally, no wheezing, no rhonchi, no respiratory distress, no conversational dyspnea   Abdominal: Soft, NT, ND, bowel sounds + Extremities: no edema, no cyanosis Psych: Normal mood and affect, stable judgement and insight     The results of significant diagnostics from this  hospitalization (including imaging, microbiology, ancillary and laboratory) are listed below for reference.     Microbiology: Recent Results (from the past 240 hour(s))  SARS CORONAVIRUS 2 (TAT 6-24 HRS) Nasopharyngeal Nasopharyngeal Swab     Status: None   Collection Time: 02/28/20  1:18 AM   Specimen: Nasopharyngeal Swab  Result Value Ref Range Status   SARS Coronavirus 2 NEGATIVE NEGATIVE Final    Comment: (NOTE) SARS-CoV-2 target nucleic acids are NOT DETECTED. The SARS-CoV-2 RNA is generally detectable in upper and lower respiratory specimens during the acute phase of infection. Negative results do not preclude SARS-CoV-2 infection, do not rule out co-infections with other pathogens, and should not be used as the sole basis for treatment or other patient management decisions. Negative results must be combined with clinical observations, patient history, and epidemiological information. The expected result is Negative. Fact Sheet for Patients: HairSlick.no Fact Sheet for Healthcare Providers: quierodirigir.com This test is not yet approved or cleared by the [place] FDA and  has been authorized for detection and/or diagnosis of SARS-CoV-2 by FDA under an Emergency Use Authorization (EUA). This EUA will  remain  in effect (meaning this test can be used) for the duration of the COVID-19 declaration under Section 56 4(b)(1) of the Act, 21 U.S.C. section 360bbb-3(b)(1), unless the authorization is terminated or revoked sooner. Performed at [facility], [address] [place], [place] [address]      Labs: BNP (last 3 results) Recent Labs    01/24/20 1048 02/27/20 2059  BNP 210.7* 67.6   Basic Metabolic Panel: Recent Labs  Lab 02/27/20 2059 02/28/20 0715 02/29/20 0501 03/01/20 0331  NA 137 138 137 139  K 5.1 5.2* 4.7 4.5  CL 99 102 101 102  CO2 26 27 24 26   GLUCOSE 170* 127* 165* 126*  BUN 36* 37* 38* 31*  CREATININE 3.46* 3.13* 2.86* 2.52*  CALCIUM 8.9 8.4* 8.4* 8.7*   Liver Function Tests: Recent Labs  Lab 02/27/20 2059  AST 23  ALT 15  ALKPHOS 26*  BILITOT 0.4  PROT 6.4*  ALBUMIN 3.5   No results for input(s): LIPASE, AMYLASE in the last 168 hours. No results for input(s): AMMONIA in the last 168 hours. CBC: Recent Labs  Lab 02/27/20 2059 02/28/20 0715 02/29/20 0501 03/01/20 0331  WBC 12.8*  --  8.6 8.6  NEUTROABS 11.0*  --   --   --   HGB 10.0* 8.5* 8.0* 8.1*  HCT 35.9* 29.8* 27.6* 28.6*  MCV 80.0  --  78.9* 81.0  PLT 243  --  201 212   Cardiac Enzymes: No results for input(s): CKTOTAL, CKMB, CKMBINDEX, TROPONINI in the last 168 hours. BNP: Invalid input(s): POCBNP CBG: Recent Labs  Lab 02/29/20 1010 02/29/20 1144 02/29/20 1728 02/29/20 2100 03/01/20 0605  GLUCAP 171* 143* 194* 120* 127*   D-Dimer No results for input(s): DDIMER in the last 72 hours. Hgb A1c No results for input(s): HGBA1C in the last 72 hours. Lipid Profile No results for input(s): CHOL, HDL, LDLCALC, TRIG, CHOLHDL, LDLDIRECT in the last 72 hours. Thyroid function studies No results for input(s): TSH, T4TOTAL, T3FREE, THYROIDAB in the last 72 hours.  Invalid input(s): FREET3 Anemia work up No results for input(s): VITAMINB12, FOLATE, FERRITIN, TIBC, IRON,  RETICCTPCT in the last 72 hours. Urinalysis    Component Value Date/Time   COLORURINE YELLOW 02/28/2020 0046   APPEARANCEUR CLEAR 02/28/2020 0046   LABSPEC 1.013 02/28/2020 0046   PHURINE 5.0 02/28/2020 0046  GLUCOSEU >=500 (A) 02/28/2020 0046   HGBUR NEGATIVE 02/28/2020 0046   BILIRUBINUR NEGATIVE 02/28/2020 0046   KETONESUR NEGATIVE 02/28/2020 0046   PROTEINUR NEGATIVE 02/28/2020 0046   NITRITE NEGATIVE 02/28/2020 0046   LEUKOCYTESUR NEGATIVE 02/28/2020 0046   Sepsis Labs Invalid input(s): PROCALCITONIN,  WBC,  LACTICIDVEN Microbiology Recent Results (from the past 240 hour(s))  SARS CORONAVIRUS 2 (TAT 6-24 HRS) Nasopharyngeal Nasopharyngeal Swab     Status: None   Collection Time: 02/28/20  1:18 AM   Specimen: Nasopharyngeal Swab  Result Value Ref Range Status   SARS Coronavirus 2 NEGATIVE NEGATIVE Final    Comment: (NOTE) SARS-CoV-2 target nucleic acids are NOT DETECTED. The SARS-CoV-2 RNA is generally detectable in upper and lower respiratory specimens during the acute phase of infection. Negative results do not preclude SARS-CoV-2 infection, do not rule out co-infections with other pathogens, and should not be used as the sole basis for treatment or other patient management decisions. Negative results must be combined with clinical observations, patient history, and epidemiological information. The expected result is Negative. Fact Sheet for Patients: SugarRoll.be Fact Sheet for Healthcare Providers: https://www.woods-mathews.com/ This test is not yet approved or cleared by the [place] FDA and  has been authorized for detection and/or diagnosis of SARS-CoV-2 by FDA under an Emergency Use Authorization (EUA). This EUA will remain  in effect (meaning this test can be used) for the duration of the COVID-19 declaration under Section 56 4(b)(1) of the Act, 21 U.S.C. section 360bbb-3(b)(1), unless the authorization is  terminated or revoked sooner. Performed at [facility], [place] [address], [place], [place] [address]      Patient was seen and examined on the day of discharge and was found to be in stable condition. Time coordinating discharge: 25 minutes including assessment and coordination of care, as well as examination of the patient.   SIGNED:  Dessa Phi, DO [facility] 03/01/2020, 9:05 AM

## 2020-03-01 NOTE — Evaluation (Signed)
Occupational Therapy Evaluation Patient Details Name: [name] MRN: [id number] DOB: 08-30-1946 Today's Date: 03/01/2020    History of Present Illness 74 y.o. male with medical history significant for coronary artery disease, chronic combined systolic and diastolic CHF, chronic kidney disease stage IIIb, insulin-dependent diabetes mellitus, chronic supplemental oxygen requirement, and obesity (BMI 50.7), presenting to the emergency department on 3/10 for evaluation of bleeding from his back where the keloid was removed, lightheadedness, and generalized weakness.   Clinical Impression   This 74 y/o male presents with the above. PTA pt living at home, receiving assist from spouse and son for ADL tasks, reports using [facility] for short distance mobility in the home. Pt currently requiring close minguard-minA (+2 utilized for safety) for room level mobility using RW; requiring mod-maxA for LB ADL tasks. Pt requiring increased time/effort for functional and mobility tasks but reports improvements noted since yesterday. Per chart pt with preference to return home with family assist, and declining [place] services at this time. He will benefit from continued acute OT services to maximize his overall safety and independence with ADL and mobility prior to return home. Will follow.     Follow Up Recommendations  Supervision/Assistance - 24 hour(per chart pt declining HH services)    Equipment Recommendations  None recommended by OT           Precautions / Restrictions Precautions Precautions: Fall Restrictions Weight Bearing Restrictions: No      Mobility Bed Mobility Overal bed mobility: Needs Assistance Bed Mobility: Supine to Sit;Sit to Supine     Supine to sit: Min guard;Min assist Sit to supine: Min assist   General bed mobility comments: pt seeking HHA to come upright but able to perform without additional assist, light assist to ensure LEs onto bed when returning to  supine  Transfers Overall transfer level: Needs assistance Equipment used: Rolling walker (2 wheeled) Transfers: Sit to/from Stand Sit to Stand: Min assist;Min guard         General transfer comment: light boosting assist from EOB to RW, increased time/effort    Balance Overall balance assessment: Needs assistance Sitting-balance support: Bilateral upper extremity supported;Feet supported Sitting balance-Leahy Scale: Good     Standing balance support: Bilateral upper extremity supported;Single extremity supported Standing balance-Leahy Scale: Fair Standing balance comment: reliant on UE support for mobility                           ADL either performed or assessed with clinical judgement   ADL Overall ADL's : Needs assistance/impaired Eating/Feeding: Modified independent;Sitting   Grooming: Set up;Sitting   Upper Body Bathing: Set up;Supervision/ safety;Sitting   Lower Body Bathing: Moderate assistance;Sit to/from stand   Upper Body Dressing : Set up;Sitting   Lower Body Dressing: Maximal assistance;Sit to/from stand Lower Body Dressing Details (indicate cue type and reason): requires assist to don socks today while seated EOB, close minguard assist for standing balance Toilet Transfer: Min guard;Minimal assistance;Ambulation;+2 for safety/equipment;RW Toilet Transfer Details (indicate cue type and reason): simulated via transfer to/from EOB, room level mobility Toileting- Clothing Manipulation and Hygiene: Minimal assistance;Sit to/from stand       Functional mobility during ADLs: Minimal assistance;Min guard;Rolling walker General ADL Comments: pt with weakness, unsteadiness, decreased activity tolerance                          Pertinent Vitals/Pain Pain Assessment: No/denies pain     Hand Dominance  Right   Extremity/Trunk Assessment Upper Extremity Assessment Upper Extremity Assessment: Overall WFL for tasks assessed   Lower Extremity  Assessment Lower Extremity Assessment: Defer to PT evaluation       Communication Communication Communication: HOH   Cognition Arousal/Alertness: Awake/alert Behavior During Therapy: WFL for tasks assessed/performed Overall Cognitive Status: Within Functional Limits for tasks assessed                                     General Comments       Exercises     Shoulder Instructions      Home Living Family/patient expects to be discharged to:: Private residence Living Arrangements: Spouse/significant other Available Help at Discharge: Family;Available 24 hours/day Type of Home: House Home Access: Stairs to enter [employer] of Steps: 4 + 3 Entrance Stairs-Rails: Left Home Layout: Able to live on main level with bedroom/bathroom     Bathroom Shower/Tub: Tub/shower unit   Bathroom Toilet: Handicapped height     Home Equipment: Environmental consultant - 2 wheels;Walker - 4 wheels;Cane - single point;Bedside commode;Wheelchair - manual          Prior Functioning/Environment Level of Independence: Needs assistance  Gait / Transfers Assistance Needed: pt uses wheelchair for community mobility, does not propel self. Pt ambulates short distances in house with either cane or by using furniture in environment to steady self. ADL's / Homemaking Assistance Needed: Pt reports needing assist from wife for sponge bathing, LE dressing, but does not require toileting assist.            OT Problem List: Decreased activity tolerance;Impaired balance (sitting and/or standing);Decreased knowledge of use of DME or AE;Decreased knowledge of precautions;Cardiopulmonary status limiting activity;Obesity;Decreased strength      OT Treatment/Interventions: Self-care/ADL training;Therapeutic exercise;Energy conservation;DME and/or AE instruction;Therapeutic activities;Patient/family education;Balance training    OT Goals(Current goals can be found in the care plan section) Acute Rehab  OT Goals Patient Stated Goal: go home OT Goal Formulation: With patient Time For Goal Achievement: 03/15/20 Potential to Achieve Goals: Good  OT Frequency: Min 2X/week   Barriers to D/C:            Co-evaluation PT/OT/SLP Co-Evaluation/Treatment: Yes Reason for Co-Treatment: For patient/therapist safety;To address functional/ADL transfers   OT goals addressed during session: ADL's and self-care      AM-PAC OT "6 Clicks" Daily Activity     Outcome Measure Help from another person eating meals?: None Help from another person taking care of personal grooming?: A Little Help from another person toileting, which includes using toliet, bedpan, or urinal?: A Lot Help from another person bathing (including washing, rinsing, drying)?: A Lot Help from another person to put on and taking off regular upper body clothing?: A Little Help from another person to put on and taking off regular lower body clothing?: A Lot 6 Click Score: 16   End of Session Equipment Utilized During Treatment: Rolling walker;Oxygen Nurse Communication: Mobility status  Activity Tolerance: Patient tolerated treatment well Patient left: in bed;with call bell/phone within reach  OT Visit Diagnosis: Other abnormalities of gait and mobility (R26.89);Muscle weakness (generalized) (M62.81)                Time: 9024-0973 OT Time Calculation (min): 16 min Charges:  OT General Charges $OT Visit: 1 Visit OT Evaluation $OT Eval Moderate Complexity: 1 Mod  [name], OT Acute Rehabilitation Services Pager [phone] Office [phone]   [name]  Oralia Manis 03/01/2020, 12:31 PM

## 2020-03-07 ENCOUNTER — Other Ambulatory Visit: Payer: Self-pay

## 2020-03-11 ENCOUNTER — Telehealth: Payer: Self-pay

## 2020-03-11 NOTE — Telephone Encounter (Signed)
Unable to speak  with patient to remind of missed remote transmission 

## 2020-03-12 IMAGING — CR DG CHEST 2V
3 series · 3 of 3 positions shown · non-contrast
Comparison: Portable chest x-ray July 21, 2018

CLINICAL DATA: Mid chest pressure with episode of nausea and
vomiting. Some improvement following 1 nitroglycerin. History of
coronary artery disease, cardiomyopathy, previous MI, cardiac
dysrhythmias.

EXAM:
CHEST - 2 VIEW

[chest ap (1 of 2)]
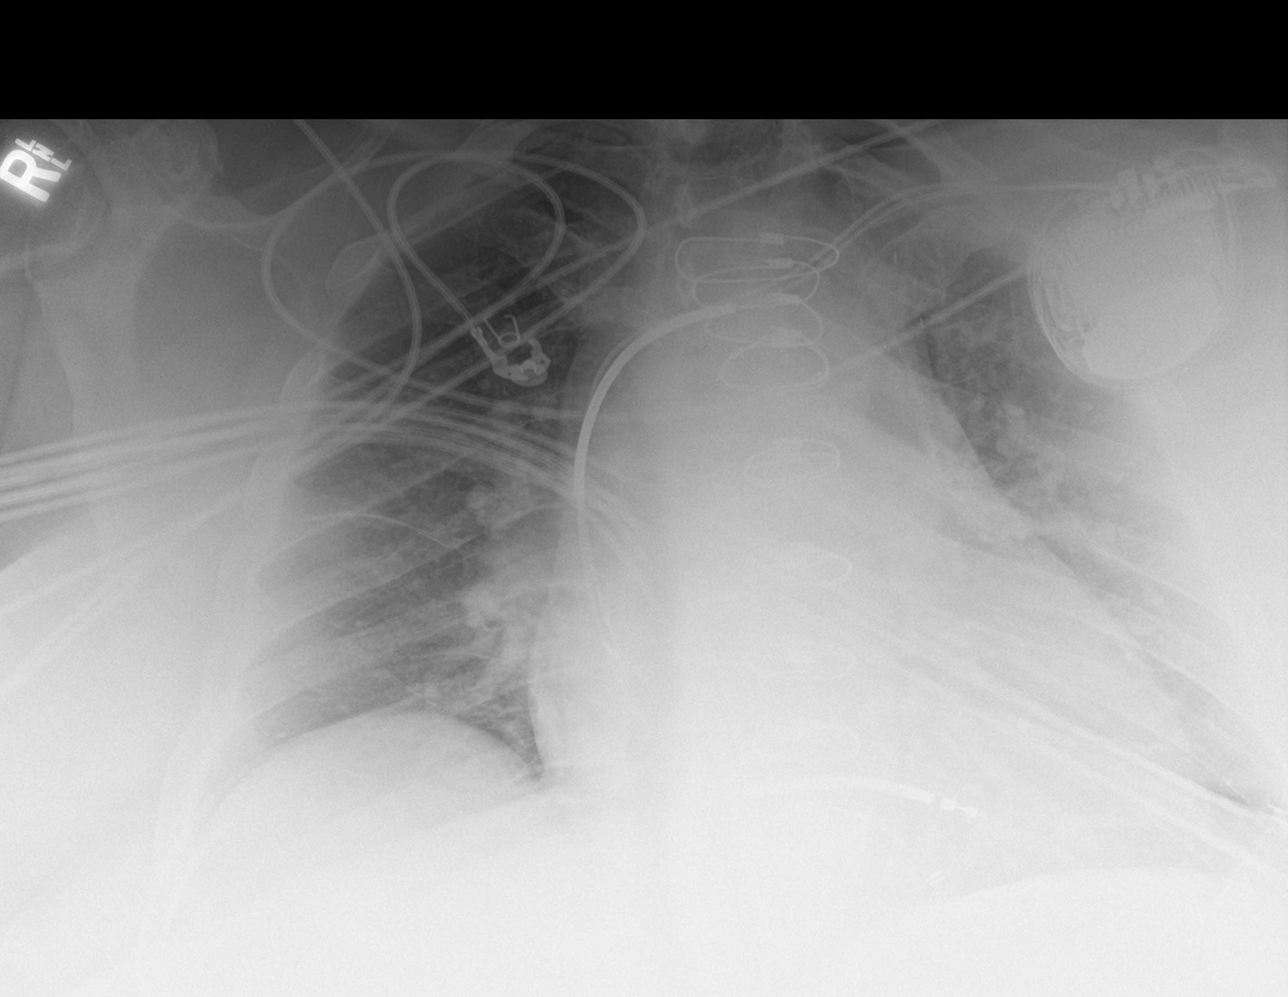

[chest lat]
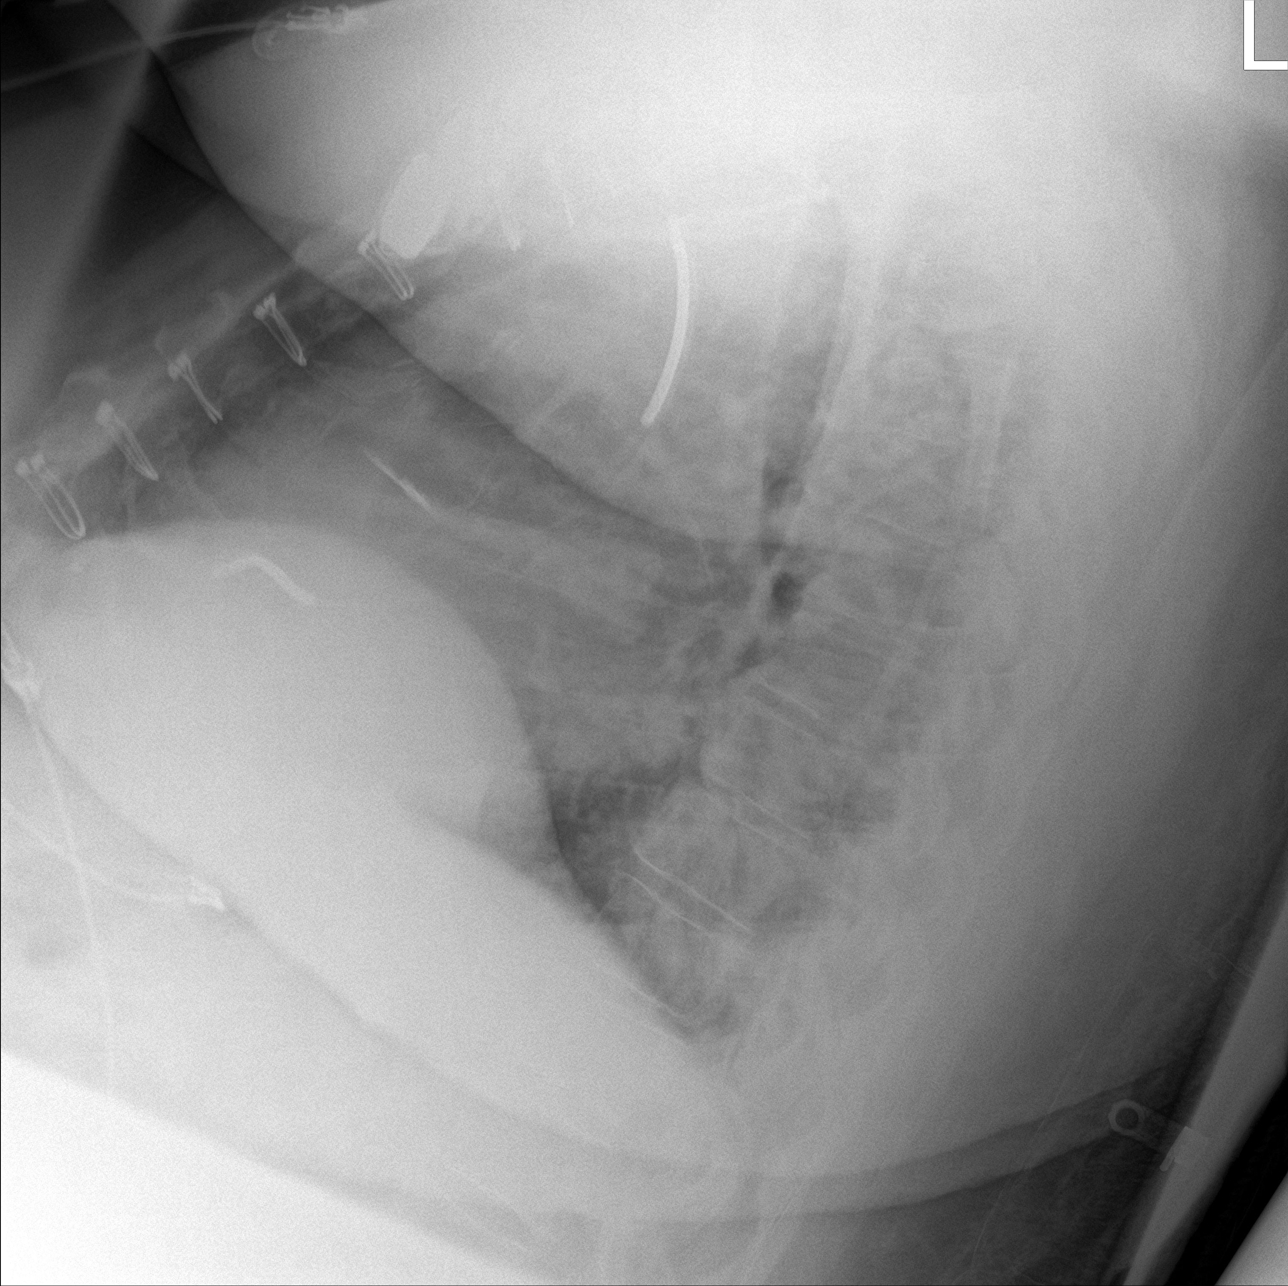

[chest ap (2 of 2)]
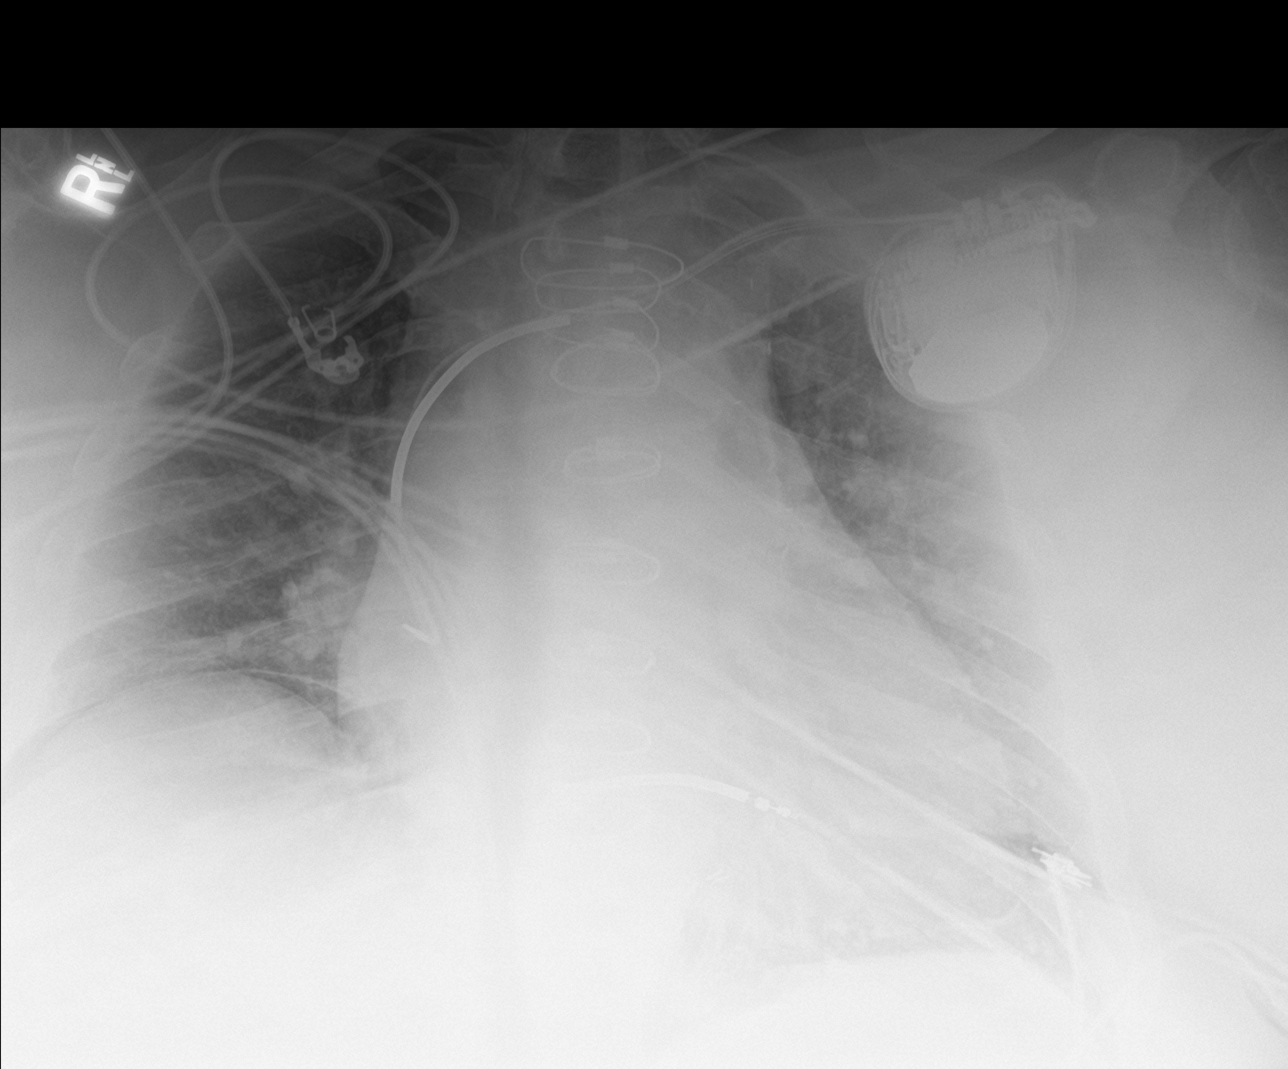

[3 of 3 positions shown; findings below may reference images not displayed]

FINDINGS: The lungs are well-expanded. The pulmonary vascularity is mildly
engorged. The interstitial markings are mildly increased. The
cardiac silhouette remains enlarged. The patient has undergone
previous median sternotomy. There is no pleural effusion. The ICD is
in stable position.
IMPRESSION: Mild CHF.  No alveolar pneumonia.

## 2020-03-19 ENCOUNTER — Ambulatory Visit (INDEPENDENT_AMBULATORY_CARE_PROVIDER_SITE_OTHER): Payer: Medicare PPO | Admitting: *Deleted

## 2020-03-19 DIAGNOSIS — I255 Ischemic cardiomyopathy: Secondary | ICD-10-CM

## 2020-03-20 ENCOUNTER — Telehealth: Payer: Self-pay | Admitting: Emergency Medicine

## 2020-03-20 LAB — CUP PACEART REMOTE DEVICE CHECK
Battery Remaining Longevity: 34 mo
Battery Voltage: 2.91 V
Brady Statistic AP VP Percent: 0.23 %
Brady Statistic AP VS Percent: 98.6 %
Brady Statistic AS VP Percent: 0 %
Brady Statistic AS VS Percent: 1.17 %
Brady Statistic RA Percent Paced: 86.24 %
Brady Statistic RV Percent Paced: 0.25 %
Date Time Interrogation Session: 20210331193626
HighPow Impedance: 46 Ohm
HighPow Impedance: 57 Ohm
Implantable Lead Implant Date: 20160520
Implantable Lead Implant Date: 20160520
Implantable Lead Location: 753859
Implantable Lead Location: 753860
Implantable Lead Model: 5076
Implantable Pulse Generator Implant Date: 20160520
Lead Channel Impedance Value: 342 Ohm
Lead Channel Impedance Value: 418 Ohm
Lead Channel Impedance Value: 456 Ohm
Lead Channel Pacing Threshold Amplitude: 0.75 V
Lead Channel Pacing Threshold Amplitude: 1 V
Lead Channel Pacing Threshold Pulse Width: 0.4 ms
Lead Channel Pacing Threshold Pulse Width: 0.4 ms
Lead Channel Sensing Intrinsic Amplitude: 10.875 mV
Lead Channel Sensing Intrinsic Amplitude: 10.875 mV
Lead Channel Sensing Intrinsic Amplitude: 4.125 mV
Lead Channel Sensing Intrinsic Amplitude: 4.125 mV
Lead Channel Setting Pacing Amplitude: 1.5 V
Lead Channel Setting Pacing Amplitude: 2 V
Lead Channel Setting Pacing Pulse Width: 0.4 ms
Lead Channel Setting Sensing Sensitivity: 0.3 mV

## 2020-03-20 NOTE — Telephone Encounter (Signed)
Received remote transmission. Optivol elevated and thoracic impedance decreased.

## 2020-03-28 ENCOUNTER — Other Ambulatory Visit (HOSPITAL_COMMUNITY): Payer: Self-pay

## 2020-03-28 MED ORDER — POTASSIUM CHLORIDE CRYS ER 20 MEQ PO TBCR: EXTENDED_RELEASE_TABLET | ORAL | 0 refills | Status: DC

## 2020-04-07 NOTE — Progress Notes (Signed)
No ICM remote transmission received for 03/31/2020 and next ICM transmission scheduled for 04/09/2020.   

## 2020-04-09 ENCOUNTER — Ambulatory Visit (INDEPENDENT_AMBULATORY_CARE_PROVIDER_SITE_OTHER): Payer: Medicare PPO

## 2020-04-09 DIAGNOSIS — I5042 Chronic combined systolic (congestive) and diastolic (congestive) heart failure: Secondary | ICD-10-CM

## 2020-04-09 DIAGNOSIS — Z95810 Presence of automatic (implantable) cardiac defibrillator: Secondary | ICD-10-CM

## 2020-04-11 NOTE — Progress Notes (Signed)
EPIC Encounter for ICM Monitoring  Patient Name: [name] is a 74 y.o. male Date: 04/11/2020 Primary Care Physican: Devra Dopp, MD Primary Cardiologist: Croitoru/Bensimhon Electrophysiologist: Graciela Husbands 03/01/2020 [id number]    Attempted call to patient and unable to reach.  Left detailed message per DPR regarding transmission. Transmission reviewed.  Hospitalization 3/10-3/13 and received IV fluids.   OptiVolThoracic impedancenormal but suggesting possible fluid accumulation 02/29/20 through 04/07/20.  Decreased impedance started below baseline at the time he discharged from the hospital.  Prescribed:Torsemide20 mgTake 40 mg in the morning and 20 mg in the evening. When your weight is 420 pounds and over, take 40 mg twice daily. Potassium take 1 tablet twice a day.  Labs: 03/01/2020 Creatinine 2.52, BUN 31, Potassium 4.5, Sodium 139, GFR 24-28 02/29/2020 Creatinine 2.86, BUN 38, Potassium 4.5, Sodium 137, GFR 21-24 02/28/2020 Creatinine 3.13, BUN 37, Potassium 5.2, Sodium 139, GFR 19-22 02/27/2020 Creatinine 3.46, BUN 36, Potassium 5.1, Sodium 137, GFR 17-19 01/22/2020 Creatinine 2.20, BUN 19, Potassium 4.5, Sodium 138, GFR 29-33 A complete set of results can be found in Results Review.  Recommendations:Unable to reach.    Follow-up plan: ICM clinic phone appointment on5/24/2021. 91 day device clinic remote transmission 06/18/2020.OV with Dr Gala Romney 05/13/2020.  Copy of ICM check sent to Dr.Klein.    3 month ICM trend: 04/09/2020    1 Year ICM trend:       Karie Soda, RN 04/11/2020 12:23 PM

## 2020-05-13 ENCOUNTER — Encounter (HOSPITAL_COMMUNITY): Payer: Medicare PPO | Admitting: Internal Medicine

## 2020-05-13 ENCOUNTER — Ambulatory Visit (HOSPITAL_COMMUNITY): Admission: RE | Admit: 2020-05-13 | Payer: Medicare PPO | Source: Ambulatory Visit

## 2020-05-13 ENCOUNTER — Telehealth: Payer: Self-pay

## 2020-05-13 NOTE — Telephone Encounter (Signed)
Left message for patient to remind of missed remote transmission.  

## 2020-05-30 NOTE — Progress Notes (Signed)
No ICM remote transmission received for 05/12/2020 and next ICM transmission scheduled for 06/16/2020.   

## 2020-06-03 ENCOUNTER — Other Ambulatory Visit (HOSPITAL_COMMUNITY): Payer: Self-pay | Admitting: Internal Medicine

## 2020-06-16 ENCOUNTER — Other Ambulatory Visit (HOSPITAL_COMMUNITY): Payer: Self-pay | Admitting: *Deleted

## 2020-06-16 ENCOUNTER — Other Ambulatory Visit: Payer: Self-pay

## 2020-06-17 ENCOUNTER — Inpatient Hospital Stay (HOSPITAL_COMMUNITY): Admission: RE | Admit: 2020-06-17 | Payer: Medicare PPO | Source: Ambulatory Visit

## 2020-06-24 ENCOUNTER — Encounter (HOSPITAL_COMMUNITY): Payer: Medicare PPO

## 2020-06-24 NOTE — Progress Notes (Signed)
No ICM remote transmission received for 06/16/2020 and next ICM transmission scheduled for 07/21/2020.   

## 2020-06-27 ENCOUNTER — Other Ambulatory Visit (HOSPITAL_COMMUNITY): Payer: Self-pay | Admitting: *Deleted

## 2020-06-27 MED ORDER — POTASSIUM CHLORIDE CRYS ER 20 MEQ PO TBCR: EXTENDED_RELEASE_TABLET | ORAL | 0 refills | Status: DC

## 2020-06-30 ENCOUNTER — Other Ambulatory Visit (HOSPITAL_COMMUNITY): Payer: Self-pay | Admitting: *Deleted

## 2020-06-30 MED ORDER — POTASSIUM CHLORIDE CRYS ER 20 MEQ PO TBCR: EXTENDED_RELEASE_TABLET | ORAL | 0 refills | Status: AC

## 2020-07-03 ENCOUNTER — Telehealth: Payer: Self-pay | Admitting: Cardiovascular Disease

## 2020-07-03 ENCOUNTER — Other Ambulatory Visit: Payer: Self-pay | Admitting: Cardiovascular Disease

## 2020-07-03 MED ORDER — ISOSORBIDE MONONITRATE ER 30 MG PO TB24: ORAL_TABLET | ORAL | 3 refills | Status: AC

## 2020-07-03 NOTE — Telephone Encounter (Signed)
    Pt c/o medication issue:  1. Name of Medication:   isosorbide mononitrate (IMDUR) 30 MG 24 hr tablet    2. How are you currently taking this medication (dosage and times per day)? TAKE 1 TABLET(30 MG) BY MOUTH DAILY  3. Are you having a reaction (difficulty breathing--STAT)?   4. What is your medication issue? Pt's wife calling, she is wondering why every month pharmacy have to call in to refill this medication. She said per pharmacy every month there's no refill that's why they have to send a request to get it refilled.

## 2020-07-03 NOTE — Telephone Encounter (Signed)
Spoke to wife and advised that last refill for Imdur was sent to the pharmacy on 1/26 for 30 tablets and 6 refills. Nurse advised wife that new Rx will be sent for 90 tablets with 3 refills.

## 2020-07-29 NOTE — Progress Notes (Signed)
No ICM remote transmission received for 07/21/2020 and next ICM transmission scheduled for 08/13/2020.

## 2020-07-31 ENCOUNTER — Other Ambulatory Visit: Payer: Self-pay | Admitting: Cardiovascular Disease

## 2020-08-04 ENCOUNTER — Other Ambulatory Visit: Payer: Self-pay | Admitting: Cardiovascular Disease

## 2020-08-05 ENCOUNTER — Other Ambulatory Visit: Payer: Self-pay

## 2020-08-05 MED ORDER — AMIODARONE HCL 200 MG PO TABS: 200.0000 mg | ORAL_TABLET | Freq: Every day | ORAL | 2 refills | Status: AC

## 2020-08-15 NOTE — Progress Notes (Signed)
No ICM remote transmission received for 08/13/2020 and next ICM transmission scheduled for 09/01/2020.

## 2020-09-08 NOTE — Progress Notes (Signed)
Unable to reach patient for ICM monthly follow up.  No device remote transmissions received since 04/09/2020.  Disenrolled from [facility] since patient no longer participating in monthly follow up.  Device clinic will continue remote monitoring every 91 days per protocol.

## 2020-10-14 ENCOUNTER — Telehealth: Payer: Self-pay

## 2020-10-14 NOTE — Telephone Encounter (Signed)
Patient wife has called to let us know that patient has passed away and wants to know if his ICD went off and we have not had a transmission since 03/2020. If you can call patient to assure there is nothing we can see on the ICD. Please call both numbers on file

## 2020-10-14 NOTE — Telephone Encounter (Signed)
The patient's wife has been called by Dr. Royann Shivers

## 2020-10-20 DEATH — deceased

## 2020-11-10 ENCOUNTER — Encounter: Payer: Medicare PPO | Admitting: Cardiovascular Disease

## 2021-08-18 IMAGING — DX DG CHEST 1V PORT
1 series · 1 of 1 positions shown · non-contrast
Comparison: 09/21/2018

CLINICAL DATA: Hypotensive

EXAM:
PORTABLE CHEST 1 VIEW

[chest ap]
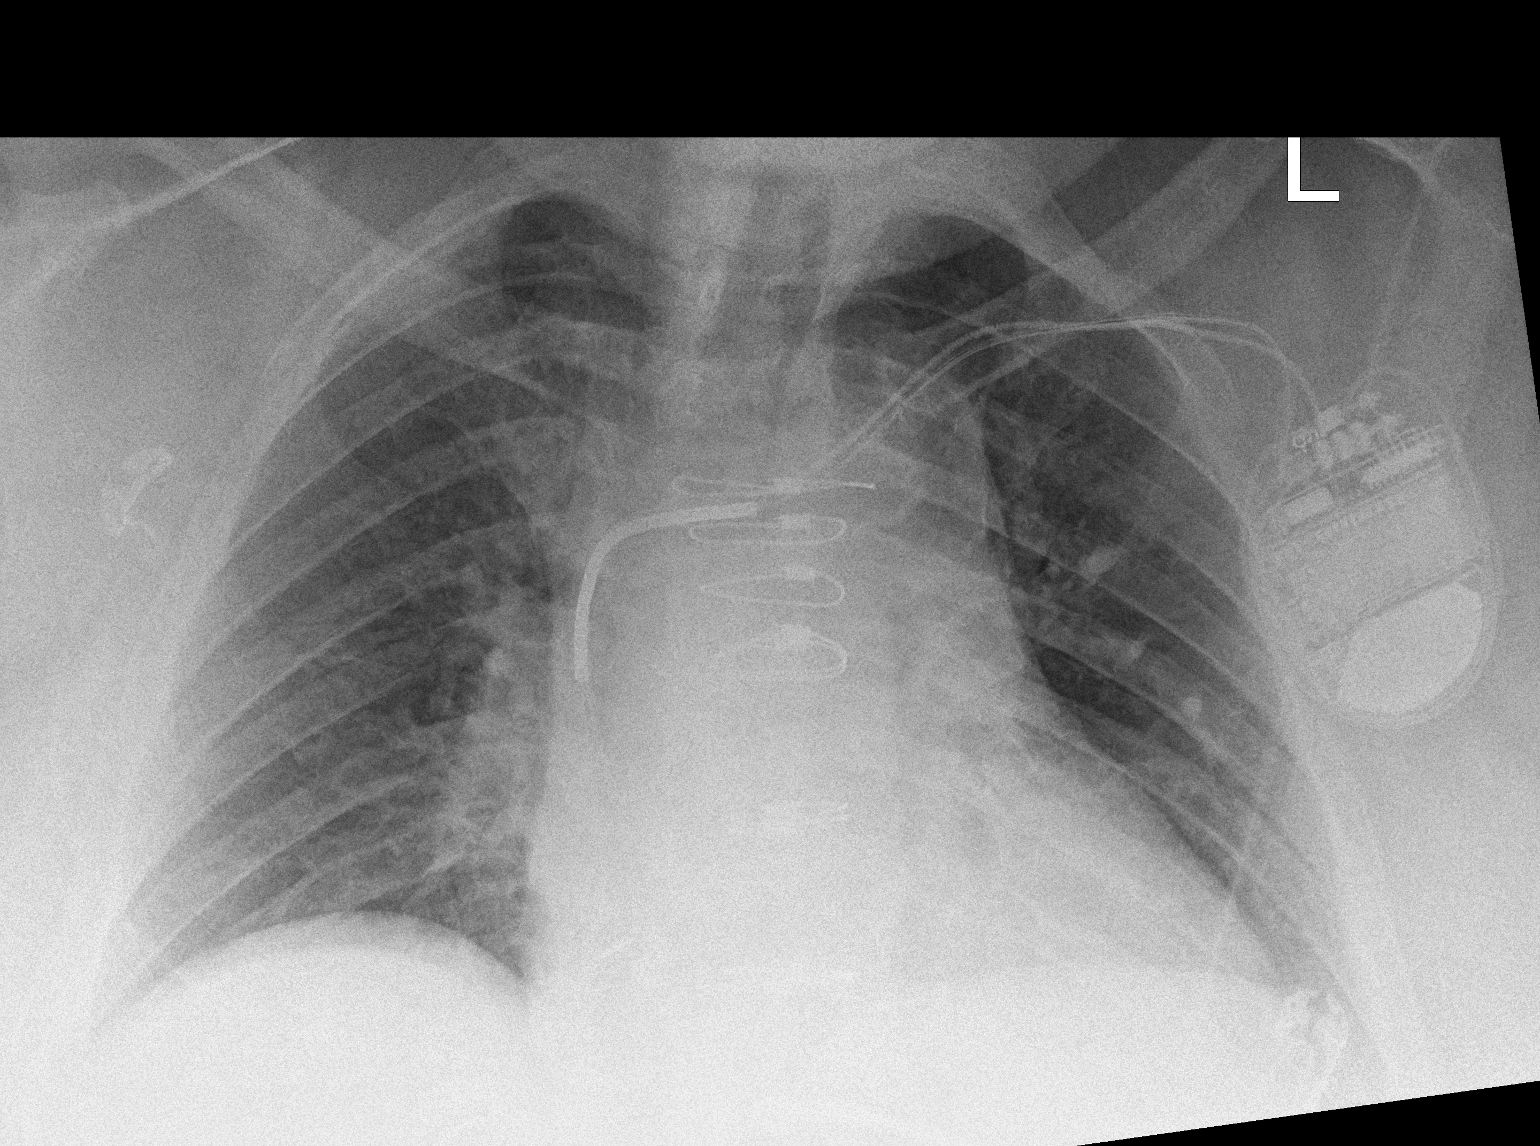

[1 of 1 positions shown; findings below may reference images not displayed]

FINDINGS: Left-sided pacing device with poor visibility of the intracardiac
leads. Post sternotomy changes. Low lung volumes. Cardiomegaly
without edema. Aortic atherosclerosis. No pneumothorax.
IMPRESSION: No active disease.  Low lung volumes.  Stable cardiomegaly
